# Patient Record
Sex: Female | Born: 1975 | Race: Black or African American | Hispanic: No | Marital: Single | Smoking: Never smoker
Health system: Southern US, Community
[De-identification: ages and names within clinical notes are randomized; demographics above are authoritative.]

## PROBLEM LIST (undated history)

## (undated) ENCOUNTER — Inpatient Hospital Stay (HOSPITAL_COMMUNITY): Payer: Self-pay

## (undated) DIAGNOSIS — N200 Calculus of kidney: Secondary | ICD-10-CM

## (undated) DIAGNOSIS — E538 Deficiency of other specified B group vitamins: Secondary | ICD-10-CM

## (undated) DIAGNOSIS — R590 Localized enlarged lymph nodes: Secondary | ICD-10-CM

## (undated) DIAGNOSIS — I10 Essential (primary) hypertension: Secondary | ICD-10-CM

## (undated) DIAGNOSIS — O039 Complete or unspecified spontaneous abortion without complication: Secondary | ICD-10-CM

## (undated) DIAGNOSIS — R519 Headache, unspecified: Secondary | ICD-10-CM

## (undated) DIAGNOSIS — M069 Rheumatoid arthritis, unspecified: Secondary | ICD-10-CM

## (undated) DIAGNOSIS — E079 Disorder of thyroid, unspecified: Secondary | ICD-10-CM

## (undated) DIAGNOSIS — K219 Gastro-esophageal reflux disease without esophagitis: Secondary | ICD-10-CM

## (undated) DIAGNOSIS — R87629 Unspecified abnormal cytological findings in specimens from vagina: Secondary | ICD-10-CM

## (undated) DIAGNOSIS — Z87898 Personal history of other specified conditions: Secondary | ICD-10-CM

## (undated) DIAGNOSIS — R070 Pain in throat: Secondary | ICD-10-CM

## (undated) DIAGNOSIS — Z87442 Personal history of urinary calculi: Secondary | ICD-10-CM

## (undated) DIAGNOSIS — J36 Peritonsillar abscess: Secondary | ICD-10-CM

## (undated) DIAGNOSIS — K5120 Ulcerative (chronic) proctitis without complications: Secondary | ICD-10-CM

## (undated) DIAGNOSIS — O99280 Endocrine, nutritional and metabolic diseases complicating pregnancy, unspecified trimester: Secondary | ICD-10-CM

## (undated) DIAGNOSIS — E039 Hypothyroidism, unspecified: Secondary | ICD-10-CM

## (undated) DIAGNOSIS — A609 Anogenital herpesviral infection, unspecified: Secondary | ICD-10-CM

## (undated) HISTORY — DX: Essential (primary) hypertension: I10

## (undated) HISTORY — PX: LEEP: SHX91

## (undated) HISTORY — PX: WISDOM TOOTH EXTRACTION: SHX21

## (undated) HISTORY — DX: Anogenital herpesviral infection, unspecified: A60.9

## (undated) HISTORY — DX: Ulcerative (chronic) proctitis without complications: K51.20

## (undated) HISTORY — DX: Peritonsillar abscess: J36

## (undated) HISTORY — DX: Pain in throat: R07.0

## (undated) HISTORY — DX: Complete or unspecified spontaneous abortion without complication: O03.9

## (undated) HISTORY — DX: Personal history of other specified conditions: Z87.898

## (undated) HISTORY — PX: CYST EXCISION: SHX5701

## (undated) HISTORY — DX: Gastro-esophageal reflux disease without esophagitis: K21.9

## (undated) HISTORY — DX: Localized enlarged lymph nodes: R59.0

## (undated) HISTORY — DX: Deficiency of other specified B group vitamins: E53.8

## (undated) HISTORY — DX: Disorder of thyroid, unspecified: E07.9

## (undated) HISTORY — DX: Disorder of thyroid, unspecified: O99.280

---

## 1997-09-29 ENCOUNTER — Emergency Department (HOSPITAL_COMMUNITY): Admission: EM | Admit: 1997-09-29 | Discharge: 1997-09-29 | Payer: Self-pay | Admitting: Emergency Medicine

## 1997-12-26 ENCOUNTER — Inpatient Hospital Stay (HOSPITAL_COMMUNITY): Admission: AD | Admit: 1997-12-26 | Discharge: 1997-12-26 | Payer: Self-pay | Admitting: *Deleted

## 1998-01-06 ENCOUNTER — Emergency Department (HOSPITAL_COMMUNITY): Admission: EM | Admit: 1998-01-06 | Discharge: 1998-01-06 | Payer: Self-pay

## 1998-06-11 ENCOUNTER — Inpatient Hospital Stay (HOSPITAL_COMMUNITY): Admission: AD | Admit: 1998-06-11 | Discharge: 1998-06-11 | Payer: Self-pay | Admitting: *Deleted

## 1998-06-17 ENCOUNTER — Inpatient Hospital Stay (HOSPITAL_COMMUNITY): Admission: EM | Admit: 1998-06-17 | Discharge: 1998-06-17 | Payer: Self-pay | Admitting: *Deleted

## 1998-09-02 ENCOUNTER — Inpatient Hospital Stay (HOSPITAL_COMMUNITY): Admission: AD | Admit: 1998-09-02 | Discharge: 1998-09-02 | Payer: Self-pay | Admitting: *Deleted

## 1998-10-20 ENCOUNTER — Emergency Department (HOSPITAL_COMMUNITY): Admission: EM | Admit: 1998-10-20 | Discharge: 1998-10-20 | Payer: Self-pay | Admitting: Emergency Medicine

## 1998-11-25 ENCOUNTER — Inpatient Hospital Stay (HOSPITAL_COMMUNITY): Admission: AD | Admit: 1998-11-25 | Discharge: 1998-11-25 | Payer: Self-pay | Admitting: *Deleted

## 1998-12-11 ENCOUNTER — Emergency Department (HOSPITAL_COMMUNITY): Admission: EM | Admit: 1998-12-11 | Discharge: 1998-12-11 | Payer: Self-pay | Admitting: Internal Medicine

## 1998-12-28 ENCOUNTER — Emergency Department (HOSPITAL_COMMUNITY): Admission: EM | Admit: 1998-12-28 | Discharge: 1998-12-28 | Payer: Self-pay | Admitting: Emergency Medicine

## 1998-12-31 ENCOUNTER — Encounter: Payer: Self-pay | Admitting: Emergency Medicine

## 1998-12-31 ENCOUNTER — Emergency Department (HOSPITAL_COMMUNITY): Admission: EM | Admit: 1998-12-31 | Discharge: 1998-12-31 | Payer: Self-pay | Admitting: Emergency Medicine

## 1999-01-07 ENCOUNTER — Encounter: Admission: RE | Admit: 1999-01-07 | Discharge: 1999-01-07 | Payer: Self-pay | Admitting: Internal Medicine

## 1999-01-15 ENCOUNTER — Ambulatory Visit (HOSPITAL_COMMUNITY): Admission: RE | Admit: 1999-01-15 | Discharge: 1999-01-15 | Payer: Self-pay | Admitting: Internal Medicine

## 1999-02-17 ENCOUNTER — Inpatient Hospital Stay (HOSPITAL_COMMUNITY): Admission: AD | Admit: 1999-02-17 | Discharge: 1999-02-17 | Payer: Self-pay | Admitting: *Deleted

## 1999-04-08 ENCOUNTER — Encounter: Admission: RE | Admit: 1999-04-08 | Discharge: 1999-04-08 | Payer: Self-pay | Admitting: Internal Medicine

## 1999-04-15 ENCOUNTER — Encounter: Admission: RE | Admit: 1999-04-15 | Discharge: 1999-04-15 | Payer: Self-pay | Admitting: Hematology and Oncology

## 1999-05-12 ENCOUNTER — Inpatient Hospital Stay (HOSPITAL_COMMUNITY): Admission: AD | Admit: 1999-05-12 | Discharge: 1999-05-12 | Payer: Self-pay | Admitting: *Deleted

## 1999-08-12 ENCOUNTER — Inpatient Hospital Stay (HOSPITAL_COMMUNITY): Admission: AD | Admit: 1999-08-12 | Discharge: 1999-08-12 | Payer: Self-pay | Admitting: *Deleted

## 1999-09-02 ENCOUNTER — Emergency Department (HOSPITAL_COMMUNITY): Admission: EM | Admit: 1999-09-02 | Discharge: 1999-09-02 | Payer: Self-pay | Admitting: *Deleted

## 1999-10-13 ENCOUNTER — Encounter: Payer: Self-pay | Admitting: Internal Medicine

## 1999-10-13 ENCOUNTER — Ambulatory Visit (HOSPITAL_COMMUNITY): Admission: RE | Admit: 1999-10-13 | Discharge: 1999-10-13 | Payer: Self-pay | Admitting: Internal Medicine

## 1999-11-18 ENCOUNTER — Inpatient Hospital Stay (HOSPITAL_COMMUNITY): Admission: AD | Admit: 1999-11-18 | Discharge: 1999-11-18 | Payer: Self-pay | Admitting: *Deleted

## 2000-02-03 ENCOUNTER — Emergency Department (HOSPITAL_COMMUNITY): Admission: EM | Admit: 2000-02-03 | Discharge: 2000-02-03 | Payer: Self-pay | Admitting: Emergency Medicine

## 2000-02-20 ENCOUNTER — Inpatient Hospital Stay (HOSPITAL_COMMUNITY): Admission: AD | Admit: 2000-02-20 | Discharge: 2000-02-20 | Payer: Self-pay | Admitting: *Deleted

## 2000-05-12 ENCOUNTER — Inpatient Hospital Stay (HOSPITAL_COMMUNITY): Admission: AD | Admit: 2000-05-12 | Discharge: 2000-05-12 | Payer: Self-pay | Admitting: *Deleted

## 2000-05-23 ENCOUNTER — Inpatient Hospital Stay (HOSPITAL_COMMUNITY): Admission: AD | Admit: 2000-05-23 | Discharge: 2000-05-23 | Payer: Self-pay | Admitting: *Deleted

## 2000-07-01 ENCOUNTER — Other Ambulatory Visit: Admission: RE | Admit: 2000-07-01 | Discharge: 2000-07-01 | Payer: Self-pay | Admitting: *Deleted

## 2000-07-01 ENCOUNTER — Encounter (INDEPENDENT_AMBULATORY_CARE_PROVIDER_SITE_OTHER): Payer: Self-pay

## 2000-08-20 ENCOUNTER — Emergency Department (HOSPITAL_COMMUNITY): Admission: EM | Admit: 2000-08-20 | Discharge: 2000-08-20 | Payer: Self-pay | Admitting: Emergency Medicine

## 2000-09-13 ENCOUNTER — Emergency Department (HOSPITAL_COMMUNITY): Admission: EM | Admit: 2000-09-13 | Discharge: 2000-09-13 | Payer: Self-pay | Admitting: Emergency Medicine

## 2000-11-25 ENCOUNTER — Encounter: Payer: Self-pay | Admitting: Emergency Medicine

## 2000-11-25 ENCOUNTER — Emergency Department (HOSPITAL_COMMUNITY): Admission: EM | Admit: 2000-11-25 | Discharge: 2000-11-25 | Payer: Self-pay | Admitting: Emergency Medicine

## 2001-02-01 ENCOUNTER — Other Ambulatory Visit: Admission: RE | Admit: 2001-02-01 | Discharge: 2001-02-01 | Payer: Self-pay | Admitting: Internal Medicine

## 2001-02-01 ENCOUNTER — Encounter: Admission: RE | Admit: 2001-02-01 | Discharge: 2001-02-01 | Payer: Self-pay | Admitting: Internal Medicine

## 2001-03-04 ENCOUNTER — Encounter: Admission: RE | Admit: 2001-03-04 | Discharge: 2001-03-04 | Payer: Self-pay

## 2001-03-05 ENCOUNTER — Emergency Department (HOSPITAL_COMMUNITY): Admission: EM | Admit: 2001-03-05 | Discharge: 2001-03-06 | Payer: Self-pay | Admitting: Emergency Medicine

## 2001-03-14 ENCOUNTER — Encounter: Admission: RE | Admit: 2001-03-14 | Discharge: 2001-03-14 | Payer: Self-pay

## 2001-05-26 ENCOUNTER — Encounter: Admission: RE | Admit: 2001-05-26 | Discharge: 2001-05-26 | Payer: Self-pay | Admitting: Internal Medicine

## 2001-05-26 ENCOUNTER — Ambulatory Visit (HOSPITAL_COMMUNITY): Admission: RE | Admit: 2001-05-26 | Discharge: 2001-05-26 | Payer: Self-pay | Admitting: Internal Medicine

## 2001-06-28 ENCOUNTER — Encounter: Admission: RE | Admit: 2001-06-28 | Discharge: 2001-06-28 | Payer: Self-pay | Admitting: Internal Medicine

## 2001-09-01 ENCOUNTER — Encounter: Admission: RE | Admit: 2001-09-01 | Discharge: 2001-09-01 | Payer: Self-pay | Admitting: Internal Medicine

## 2001-10-04 ENCOUNTER — Encounter: Admission: RE | Admit: 2001-10-04 | Discharge: 2001-10-04 | Payer: Self-pay | Admitting: Internal Medicine

## 2002-01-24 ENCOUNTER — Encounter: Admission: RE | Admit: 2002-01-24 | Discharge: 2002-01-24 | Payer: Self-pay | Admitting: Internal Medicine

## 2002-03-31 ENCOUNTER — Emergency Department (HOSPITAL_COMMUNITY): Admission: EM | Admit: 2002-03-31 | Discharge: 2002-03-31 | Payer: Self-pay | Admitting: Emergency Medicine

## 2002-06-01 ENCOUNTER — Encounter: Admission: RE | Admit: 2002-06-01 | Discharge: 2002-06-01 | Payer: Self-pay | Admitting: Internal Medicine

## 2002-06-01 ENCOUNTER — Ambulatory Visit (HOSPITAL_COMMUNITY): Admission: RE | Admit: 2002-06-01 | Discharge: 2002-06-01 | Payer: Self-pay | Admitting: Internal Medicine

## 2002-06-01 ENCOUNTER — Encounter: Payer: Self-pay | Admitting: Internal Medicine

## 2002-06-27 ENCOUNTER — Encounter: Admission: RE | Admit: 2002-06-27 | Discharge: 2002-06-27 | Payer: Self-pay | Admitting: Internal Medicine

## 2003-02-27 ENCOUNTER — Emergency Department (HOSPITAL_COMMUNITY): Admission: EM | Admit: 2003-02-27 | Discharge: 2003-02-27 | Payer: Self-pay | Admitting: *Deleted

## 2003-04-03 ENCOUNTER — Emergency Department (HOSPITAL_COMMUNITY): Admission: EM | Admit: 2003-04-03 | Discharge: 2003-04-04 | Payer: Self-pay | Admitting: Emergency Medicine

## 2003-07-24 ENCOUNTER — Emergency Department (HOSPITAL_COMMUNITY): Admission: EM | Admit: 2003-07-24 | Discharge: 2003-07-24 | Payer: Self-pay | Admitting: Family Medicine

## 2003-07-26 ENCOUNTER — Emergency Department (HOSPITAL_COMMUNITY): Admission: EM | Admit: 2003-07-26 | Discharge: 2003-07-26 | Payer: Self-pay | Admitting: Family Medicine

## 2003-07-28 ENCOUNTER — Emergency Department (HOSPITAL_COMMUNITY): Admission: EM | Admit: 2003-07-28 | Discharge: 2003-07-28 | Payer: Self-pay | Admitting: Family Medicine

## 2003-08-08 ENCOUNTER — Encounter: Admission: RE | Admit: 2003-08-08 | Discharge: 2003-08-08 | Payer: Self-pay | Admitting: Internal Medicine

## 2003-08-28 ENCOUNTER — Emergency Department (HOSPITAL_COMMUNITY): Admission: EM | Admit: 2003-08-28 | Discharge: 2003-08-28 | Payer: Self-pay | Admitting: Family Medicine

## 2003-09-13 ENCOUNTER — Encounter: Admission: RE | Admit: 2003-09-13 | Discharge: 2003-09-13 | Payer: Self-pay | Admitting: Internal Medicine

## 2003-10-19 ENCOUNTER — Encounter: Admission: RE | Admit: 2003-10-19 | Discharge: 2003-10-19 | Payer: Self-pay | Admitting: Internal Medicine

## 2004-02-11 ENCOUNTER — Emergency Department (HOSPITAL_COMMUNITY): Admission: EM | Admit: 2004-02-11 | Discharge: 2004-02-11 | Payer: Self-pay | Admitting: Family Medicine

## 2004-03-06 ENCOUNTER — Emergency Department (HOSPITAL_COMMUNITY): Admission: EM | Admit: 2004-03-06 | Discharge: 2004-03-06 | Payer: Self-pay | Admitting: Family Medicine

## 2004-04-06 ENCOUNTER — Inpatient Hospital Stay (HOSPITAL_COMMUNITY): Admission: AD | Admit: 2004-04-06 | Discharge: 2004-04-07 | Payer: Self-pay | Admitting: Obstetrics & Gynecology

## 2004-06-23 ENCOUNTER — Emergency Department (HOSPITAL_COMMUNITY): Admission: EM | Admit: 2004-06-23 | Discharge: 2004-06-23 | Payer: Self-pay | Admitting: *Deleted

## 2004-06-30 ENCOUNTER — Ambulatory Visit: Payer: Self-pay | Admitting: Internal Medicine

## 2004-07-15 ENCOUNTER — Ambulatory Visit: Payer: Self-pay | Admitting: Internal Medicine

## 2004-08-15 ENCOUNTER — Encounter (INDEPENDENT_AMBULATORY_CARE_PROVIDER_SITE_OTHER): Payer: Self-pay | Admitting: Specialist

## 2004-08-15 ENCOUNTER — Ambulatory Visit (HOSPITAL_COMMUNITY): Admission: RE | Admit: 2004-08-15 | Discharge: 2004-08-15 | Payer: Self-pay | Admitting: Otolaryngology

## 2004-08-15 ENCOUNTER — Ambulatory Visit (HOSPITAL_BASED_OUTPATIENT_CLINIC_OR_DEPARTMENT_OTHER): Admission: RE | Admit: 2004-08-15 | Discharge: 2004-08-15 | Payer: Self-pay | Admitting: Otolaryngology

## 2004-08-17 ENCOUNTER — Emergency Department (HOSPITAL_COMMUNITY): Admission: EM | Admit: 2004-08-17 | Discharge: 2004-08-17 | Payer: Self-pay | Admitting: Emergency Medicine

## 2004-10-24 ENCOUNTER — Ambulatory Visit (HOSPITAL_COMMUNITY): Admission: RE | Admit: 2004-10-24 | Discharge: 2004-10-24 | Payer: Self-pay | Admitting: Obstetrics

## 2004-11-24 ENCOUNTER — Ambulatory Visit (HOSPITAL_COMMUNITY): Admission: RE | Admit: 2004-11-24 | Discharge: 2004-11-24 | Payer: Self-pay

## 2005-02-05 ENCOUNTER — Ambulatory Visit (HOSPITAL_COMMUNITY): Admission: RE | Admit: 2005-02-05 | Discharge: 2005-02-05 | Payer: Self-pay | Admitting: Internal Medicine

## 2005-02-05 ENCOUNTER — Ambulatory Visit: Payer: Self-pay | Admitting: Internal Medicine

## 2005-02-25 ENCOUNTER — Ambulatory Visit (HOSPITAL_COMMUNITY): Admission: RE | Admit: 2005-02-25 | Discharge: 2005-02-25 | Payer: Self-pay | Admitting: Obstetrics

## 2005-03-09 HISTORY — PX: OTHER SURGICAL HISTORY: SHX169

## 2005-05-06 ENCOUNTER — Ambulatory Visit (HOSPITAL_COMMUNITY): Admission: RE | Admit: 2005-05-06 | Discharge: 2005-05-06 | Payer: Self-pay | Admitting: Obstetrics

## 2005-05-08 ENCOUNTER — Inpatient Hospital Stay (HOSPITAL_COMMUNITY): Admission: AD | Admit: 2005-05-08 | Discharge: 2005-05-08 | Payer: Self-pay | Admitting: Obstetrics

## 2005-07-06 ENCOUNTER — Inpatient Hospital Stay (HOSPITAL_COMMUNITY): Admission: AD | Admit: 2005-07-06 | Discharge: 2005-07-08 | Payer: Self-pay | Admitting: Obstetrics & Gynecology

## 2006-01-12 ENCOUNTER — Ambulatory Visit: Payer: Self-pay | Admitting: Internal Medicine

## 2006-01-19 ENCOUNTER — Encounter: Admission: RE | Admit: 2006-01-19 | Discharge: 2006-02-19 | Payer: Self-pay | Admitting: Internal Medicine

## 2006-02-08 DIAGNOSIS — J45901 Unspecified asthma with (acute) exacerbation: Secondary | ICD-10-CM | POA: Insufficient documentation

## 2006-02-22 ENCOUNTER — Ambulatory Visit (HOSPITAL_COMMUNITY): Admission: RE | Admit: 2006-02-22 | Discharge: 2006-02-22 | Payer: Self-pay | Admitting: Internal Medicine

## 2006-05-28 ENCOUNTER — Emergency Department (HOSPITAL_COMMUNITY): Admission: EM | Admit: 2006-05-28 | Discharge: 2006-05-28 | Payer: Self-pay | Admitting: Emergency Medicine

## 2006-06-28 ENCOUNTER — Encounter (INDEPENDENT_AMBULATORY_CARE_PROVIDER_SITE_OTHER): Payer: Self-pay | Admitting: Internal Medicine

## 2006-08-10 ENCOUNTER — Ambulatory Visit: Payer: Self-pay | Admitting: Internal Medicine

## 2007-06-15 ENCOUNTER — Ambulatory Visit: Payer: Self-pay | Admitting: Internal Medicine

## 2007-06-15 ENCOUNTER — Encounter (INDEPENDENT_AMBULATORY_CARE_PROVIDER_SITE_OTHER): Payer: Self-pay | Admitting: Internal Medicine

## 2007-06-15 ENCOUNTER — Encounter (INDEPENDENT_AMBULATORY_CARE_PROVIDER_SITE_OTHER): Payer: Self-pay | Admitting: *Deleted

## 2007-06-15 DIAGNOSIS — I10 Essential (primary) hypertension: Secondary | ICD-10-CM | POA: Insufficient documentation

## 2007-06-15 LAB — CONVERTED CEMR LAB
Amphetamine Screen, Ur: NEGATIVE
BUN: 16 mg/dL (ref 6–23)
Barbiturate Quant, Ur: NEGATIVE
Basophils Absolute: 0 10*3/uL (ref 0.0–0.1)
Basophils Relative: 0 % (ref 0–1)
Benzodiazepines.: NEGATIVE
Bilirubin Urine: NEGATIVE
CO2: 25 meq/L (ref 19–32)
Calcium: 9.3 mg/dL (ref 8.4–10.5)
Chloride: 97 meq/L (ref 96–112)
Cocaine Metabolites: NEGATIVE
Creatinine, Ser: 0.99 mg/dL (ref 0.40–1.20)
Creatinine,U: 196.6 mg/dL
Eosinophils Absolute: 0.2 10*3/uL (ref 0.0–0.7)
Eosinophils Relative: 3 % (ref 0–5)
Glucose, Bld: 86 mg/dL (ref 70–99)
HCT: 45.4 % (ref 36.0–46.0)
Hemoglobin: 16.1 g/dL — ABNORMAL HIGH (ref 12.0–15.0)
Ketones, ur: NEGATIVE mg/dL
Leukocytes, UA: NEGATIVE
Lymphocytes Relative: 39 % (ref 12–46)
Lymphs Abs: 2.9 10*3/uL (ref 0.7–4.0)
MCHC: 35.5 g/dL (ref 30.0–36.0)
MCV: 86.5 fL (ref 78.0–100.0)
Marijuana Metabolite: POSITIVE — AB
Methadone: NEGATIVE
Monocytes Absolute: 0.6 10*3/uL (ref 0.1–1.0)
Monocytes Relative: 8 % (ref 3–12)
Neutro Abs: 3.7 10*3/uL (ref 1.7–7.7)
Neutrophils Relative %: 50 % (ref 43–77)
Nitrite: NEGATIVE
Opiates: NEGATIVE
Phencyclidine (PCP): NEGATIVE
Platelets: 332 10*3/uL (ref 150–400)
Potassium: 4.5 meq/L (ref 3.5–5.3)
Propoxyphene: NEGATIVE
Protein, ur: NEGATIVE mg/dL
RBC: 5.25 M/uL — ABNORMAL HIGH (ref 3.87–5.11)
RDW: 13.5 % (ref 11.5–15.5)
Sodium: 135 meq/L (ref 135–145)
Specific Gravity, Urine: 1.025 (ref 1.005–1.03)
TSH: 0.992 microintl units/mL (ref 0.350–5.50)
Urine Glucose: NEGATIVE mg/dL
Urobilinogen, UA: 0.2 (ref 0.0–1.0)
WBC, UA: NONE SEEN cells/hpf (ref <3)
WBC: 7.4 10*3/uL (ref 4.0–10.5)
pH: 6 (ref 5.0–8.0)

## 2007-06-23 ENCOUNTER — Ambulatory Visit: Payer: Self-pay | Admitting: Infectious Disease

## 2007-07-26 ENCOUNTER — Ambulatory Visit: Payer: Self-pay | Admitting: Internal Medicine

## 2007-08-16 ENCOUNTER — Ambulatory Visit: Payer: Self-pay | Admitting: Internal Medicine

## 2007-09-26 ENCOUNTER — Telehealth: Payer: Self-pay | Admitting: *Deleted

## 2007-10-07 ENCOUNTER — Emergency Department (HOSPITAL_COMMUNITY): Admission: EM | Admit: 2007-10-07 | Discharge: 2007-10-07 | Payer: Self-pay | Admitting: Emergency Medicine

## 2007-11-04 ENCOUNTER — Ambulatory Visit: Payer: Self-pay | Admitting: Internal Medicine

## 2007-12-08 ENCOUNTER — Encounter (INDEPENDENT_AMBULATORY_CARE_PROVIDER_SITE_OTHER): Payer: Self-pay | Admitting: Internal Medicine

## 2007-12-12 ENCOUNTER — Encounter (INDEPENDENT_AMBULATORY_CARE_PROVIDER_SITE_OTHER): Payer: Self-pay | Admitting: Internal Medicine

## 2007-12-13 ENCOUNTER — Emergency Department (HOSPITAL_COMMUNITY): Admission: EM | Admit: 2007-12-13 | Discharge: 2007-12-13 | Payer: Self-pay | Admitting: Emergency Medicine

## 2007-12-20 ENCOUNTER — Ambulatory Visit (HOSPITAL_COMMUNITY): Admission: RE | Admit: 2007-12-20 | Discharge: 2007-12-20 | Payer: Self-pay | Admitting: Internal Medicine

## 2007-12-20 ENCOUNTER — Encounter (INDEPENDENT_AMBULATORY_CARE_PROVIDER_SITE_OTHER): Payer: Self-pay | Admitting: Internal Medicine

## 2007-12-26 ENCOUNTER — Encounter (INDEPENDENT_AMBULATORY_CARE_PROVIDER_SITE_OTHER): Payer: Self-pay | Admitting: Internal Medicine

## 2007-12-26 DIAGNOSIS — K5120 Ulcerative (chronic) proctitis without complications: Secondary | ICD-10-CM

## 2007-12-26 HISTORY — DX: Ulcerative (chronic) proctitis without complications: K51.20

## 2007-12-30 ENCOUNTER — Encounter (INDEPENDENT_AMBULATORY_CARE_PROVIDER_SITE_OTHER): Payer: Self-pay | Admitting: Internal Medicine

## 2008-01-30 ENCOUNTER — Ambulatory Visit: Payer: Self-pay | Admitting: *Deleted

## 2008-01-30 ENCOUNTER — Ambulatory Visit (HOSPITAL_COMMUNITY): Admission: RE | Admit: 2008-01-30 | Discharge: 2008-01-30 | Payer: Self-pay | Admitting: Internal Medicine

## 2008-01-30 ENCOUNTER — Encounter (INDEPENDENT_AMBULATORY_CARE_PROVIDER_SITE_OTHER): Payer: Self-pay | Admitting: *Deleted

## 2008-02-01 LAB — CONVERTED CEMR LAB
Free T4: 1.17 ng/dL (ref 0.89–1.80)
TSH: 0.228 microintl units/mL — ABNORMAL LOW (ref 0.350–4.50)

## 2008-03-28 ENCOUNTER — Encounter (INDEPENDENT_AMBULATORY_CARE_PROVIDER_SITE_OTHER): Payer: Self-pay | Admitting: Internal Medicine

## 2008-06-22 ENCOUNTER — Emergency Department (HOSPITAL_COMMUNITY): Admission: EM | Admit: 2008-06-22 | Discharge: 2008-06-22 | Payer: Self-pay | Admitting: Emergency Medicine

## 2008-07-03 ENCOUNTER — Encounter (INDEPENDENT_AMBULATORY_CARE_PROVIDER_SITE_OTHER): Payer: Self-pay | Admitting: Internal Medicine

## 2008-08-16 ENCOUNTER — Encounter: Admission: RE | Admit: 2008-08-16 | Discharge: 2008-08-16 | Payer: Self-pay | Admitting: Gastroenterology

## 2008-08-21 ENCOUNTER — Encounter: Admission: RE | Admit: 2008-08-21 | Discharge: 2008-08-21 | Payer: Self-pay | Admitting: Gastroenterology

## 2009-03-05 ENCOUNTER — Encounter: Payer: Self-pay | Admitting: Internal Medicine

## 2009-04-11 ENCOUNTER — Ambulatory Visit: Payer: Self-pay | Admitting: Internal Medicine

## 2009-04-11 LAB — CONVERTED CEMR LAB
BUN: 8 mg/dL (ref 6–23)
CO2: 24 meq/L (ref 19–32)
Calcium: 9.1 mg/dL (ref 8.4–10.5)
Chloride: 106 meq/L (ref 96–112)
Creatinine, Ser: 0.85 mg/dL (ref 0.40–1.20)
Glucose, Bld: 96 mg/dL (ref 70–99)
Potassium: 4.1 meq/L (ref 3.5–5.3)
Sodium: 139 meq/L (ref 135–145)

## 2009-04-23 ENCOUNTER — Ambulatory Visit: Payer: Self-pay | Admitting: Sports Medicine

## 2009-05-21 ENCOUNTER — Ambulatory Visit: Payer: Self-pay | Admitting: Sports Medicine

## 2009-06-27 ENCOUNTER — Telehealth: Payer: Self-pay | Admitting: Internal Medicine

## 2009-08-12 ENCOUNTER — Emergency Department (HOSPITAL_COMMUNITY): Admission: EM | Admit: 2009-08-12 | Discharge: 2009-08-12 | Payer: Self-pay | Admitting: Family Medicine

## 2009-12-17 ENCOUNTER — Emergency Department (HOSPITAL_COMMUNITY): Admission: EM | Admit: 2009-12-17 | Discharge: 2009-12-17 | Payer: Self-pay | Admitting: Emergency Medicine

## 2009-12-30 ENCOUNTER — Telehealth (INDEPENDENT_AMBULATORY_CARE_PROVIDER_SITE_OTHER): Payer: Self-pay | Admitting: *Deleted

## 2009-12-31 ENCOUNTER — Ambulatory Visit: Payer: Self-pay | Admitting: Family Medicine

## 2009-12-31 ENCOUNTER — Encounter (INDEPENDENT_AMBULATORY_CARE_PROVIDER_SITE_OTHER): Payer: Self-pay | Admitting: *Deleted

## 2009-12-31 DIAGNOSIS — IMO0002 Reserved for concepts with insufficient information to code with codable children: Secondary | ICD-10-CM | POA: Insufficient documentation

## 2010-01-02 ENCOUNTER — Encounter: Payer: Self-pay | Admitting: Internal Medicine

## 2010-01-02 ENCOUNTER — Observation Stay (HOSPITAL_COMMUNITY): Admission: EM | Admit: 2010-01-02 | Discharge: 2010-01-03 | Payer: Self-pay | Admitting: Internal Medicine

## 2010-01-02 ENCOUNTER — Emergency Department (HOSPITAL_COMMUNITY): Admission: EM | Admit: 2010-01-02 | Discharge: 2010-01-02 | Payer: Self-pay | Admitting: Emergency Medicine

## 2010-01-02 ENCOUNTER — Ambulatory Visit: Payer: Self-pay | Admitting: Internal Medicine

## 2010-01-02 ENCOUNTER — Telehealth: Payer: Self-pay | Admitting: *Deleted

## 2010-01-03 ENCOUNTER — Encounter: Payer: Self-pay | Admitting: Internal Medicine

## 2010-01-06 ENCOUNTER — Encounter (INDEPENDENT_AMBULATORY_CARE_PROVIDER_SITE_OTHER): Payer: Self-pay | Admitting: *Deleted

## 2010-01-14 ENCOUNTER — Ambulatory Visit: Payer: Self-pay | Admitting: Family Medicine

## 2010-01-21 ENCOUNTER — Ambulatory Visit: Payer: Self-pay | Admitting: Internal Medicine

## 2010-02-05 ENCOUNTER — Telehealth: Payer: Self-pay | Admitting: Internal Medicine

## 2010-02-06 ENCOUNTER — Ambulatory Visit: Payer: Self-pay | Admitting: Internal Medicine

## 2010-02-14 ENCOUNTER — Telehealth: Payer: Self-pay | Admitting: Internal Medicine

## 2010-02-17 ENCOUNTER — Ambulatory Visit: Payer: Self-pay

## 2010-03-29 ENCOUNTER — Encounter: Payer: Self-pay | Admitting: Internal Medicine

## 2010-03-30 ENCOUNTER — Encounter: Payer: Self-pay | Admitting: Obstetrics

## 2010-04-01 ENCOUNTER — Telehealth: Payer: Self-pay | Admitting: *Deleted

## 2010-04-01 ENCOUNTER — Emergency Department (HOSPITAL_COMMUNITY)
Admission: EM | Admit: 2010-04-01 | Discharge: 2010-04-01 | Disposition: A | Discharge status: Other Acute Inpatient Hospital | Payer: Self-pay | Source: Home / Self Care | Admitting: Family Medicine

## 2010-04-01 ENCOUNTER — Emergency Department (HOSPITAL_COMMUNITY)
Admission: EM | Admit: 2010-04-01 | Discharge: 2010-04-01 | Payer: Self-pay | Source: Home / Self Care | Admitting: Emergency Medicine

## 2010-04-02 LAB — URINALYSIS, ROUTINE W REFLEX MICROSCOPIC
Bilirubin Urine: NEGATIVE
Hgb urine dipstick: NEGATIVE
Ketones, ur: NEGATIVE mg/dL
Nitrite: NEGATIVE
Protein, ur: NEGATIVE mg/dL
Specific Gravity, Urine: 1.021 (ref 1.005–1.030)
Urine Glucose, Fasting: NEGATIVE mg/dL
Urobilinogen, UA: 1 mg/dL (ref 0.0–1.0)
pH: 8 (ref 5.0–8.0)

## 2010-04-02 LAB — WET PREP, GENITAL
Trich, Wet Prep: NONE SEEN
Yeast Wet Prep HPF POC: NONE SEEN

## 2010-04-02 LAB — POCT URINALYSIS DIPSTICK
Bilirubin Urine: NEGATIVE
Ketones, ur: NEGATIVE mg/dL
Nitrite: NEGATIVE
Protein, ur: 30 mg/dL — AB
Specific Gravity, Urine: 1.025 (ref 1.005–1.030)
Urine Glucose, Fasting: NEGATIVE mg/dL
Urobilinogen, UA: 4 mg/dL — ABNORMAL HIGH (ref 0.0–1.0)
pH: 7 (ref 5.0–8.0)

## 2010-04-02 LAB — CBC
HCT: 35.9 % — ABNORMAL LOW (ref 36.0–46.0)
Hemoglobin: 12.4 g/dL (ref 12.0–15.0)
MCH: 30 pg (ref 26.0–34.0)
MCHC: 34.5 g/dL (ref 30.0–36.0)
MCV: 86.9 fL (ref 78.0–100.0)
Platelets: 256 10*3/uL (ref 150–400)
RBC: 4.13 MIL/uL (ref 3.87–5.11)
RDW: 13.6 % (ref 11.5–15.5)
WBC: 12.4 10*3/uL — ABNORMAL HIGH (ref 4.0–10.5)

## 2010-04-02 LAB — DIFFERENTIAL
Basophils Absolute: 0 10*3/uL (ref 0.0–0.1)
Basophils Relative: 0 % (ref 0–1)
Eosinophils Absolute: 0 10*3/uL (ref 0.0–0.7)
Eosinophils Relative: 0 % (ref 0–5)
Lymphocytes Relative: 18 % (ref 12–46)
Lymphs Abs: 2.2 10*3/uL (ref 0.7–4.0)
Monocytes Absolute: 0.7 10*3/uL (ref 0.1–1.0)
Monocytes Relative: 6 % (ref 3–12)
Neutro Abs: 9.4 10*3/uL — ABNORMAL HIGH (ref 1.7–7.7)
Neutrophils Relative %: 76 % (ref 43–77)

## 2010-04-02 LAB — COMPREHENSIVE METABOLIC PANEL
ALT: 15 U/L (ref 0–35)
AST: 17 U/L (ref 0–37)
Albumin: 3 g/dL — ABNORMAL LOW (ref 3.5–5.2)
Alkaline Phosphatase: 63 U/L (ref 39–117)
BUN: 6 mg/dL (ref 6–23)
CO2: 23 mEq/L (ref 19–32)
Calcium: 8.5 mg/dL (ref 8.4–10.5)
Chloride: 107 mEq/L (ref 96–112)
Creatinine, Ser: 0.82 mg/dL (ref 0.4–1.2)
GFR calc Af Amer: >60 mL/min (ref >60)
GFR calc non Af Amer: >60 mL/min (ref >60)
Glucose, Bld: 104 mg/dL — ABNORMAL HIGH (ref 70–99)
Potassium: 3.8 mEq/L (ref 3.5–5.1)
Sodium: 135 mEq/L (ref 135–145)
Total Bilirubin: 0.3 mg/dL (ref 0.3–1.2)
Total Protein: 6.7 g/dL (ref 6.0–8.3)

## 2010-04-02 LAB — OCCULT BLOOD, POC DEVICE: Fecal Occult Bld: NEGATIVE

## 2010-04-02 LAB — POCT PREGNANCY, URINE
Preg Test, Ur: NEGATIVE
Preg Test, Ur: NEGATIVE

## 2010-04-02 LAB — URINE MICROSCOPIC-ADD ON

## 2010-04-02 LAB — GC/CHLAMYDIA PROBE AMP, GENITAL
Chlamydia, DNA Probe: NEGATIVE
GC Probe Amp, Genital: NEGATIVE

## 2010-04-02 LAB — LIPASE, BLOOD: Lipase: 25 U/L (ref 11–59)

## 2010-04-08 NOTE — Assessment & Plan Note (Signed)
Summary: BACK PAIN/SB.   Vital Signs:  Patient profile:   35 year old female Height:      65 inches (165.10 cm) Weight:      148.7 pounds (67.59 kg) BMI:     24.83 Temp:     97.1 degrees F (36.17 degrees C) oral Pulse rate:   63 / minute BP sitting:   133 / 89  (right arm)  Vitals Entered By: Stanton Kidney Ditzler RN (April 11, 2009 9:07 AM) Is Patient Diabetic? No Pain Assessment Patient in pain? yes     Location: low back Intensity: 3 Type: aching Onset of pain  long time Nutritional Status BMI of 19 -24 = normal Nutritional Status Detail appetite good  Have you ever been in a relationship where you felt threatened, hurt or afraid?denies   Does patient need assistance? Functional Status Self care Ambulation Normal Comments Discuss back pain.   History of Present Illness: 35 y/o female with pmh as described on the EMR; who comes tothe clinic complaining of back pain. Pt reports her pain is about 3-5/10 in intensity, localized in her left lumbar area and radiated down her leg; is associated with numbness of her feet when occurred.  Patient has used  advil on/off without significant relief. She denies muscle weakness associated with her pain, but endorses muscle spasm and some difficulty walking and bearing weight on her left hip when the pain is present.  Patient denies fever, chills, incontinence, dysuria, nausea, vomiting, diarrhea, abdominal pain, SOB, CP or any other complaints.   Patient has been compliant with her medications and is also following a low sodium diet. Her asthma is pretty well controlledand she use her albuterol just occasionally.  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  Depression History:      The patient denies a depressed mood most of the day and a diminished interest in her usual daily activities.     The patient denies that she feels like life is not worth living, denies that she wishes that she were dead, and denies that she has thought about ending her life.         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) 2)  Sore Throat  (ICD-462) 3)  Ulcerative Proctitis  (ICD-556.2) 4)  Rectal Bleeding  (ICD-569.3) 5)  Hypertension  (ICD-401.9) 6)  Tachycardia  (ICD-785.0) 7)  Palpitations  (ICD-785.1) 8)  Dizziness  (ICD-780.4) 9)  Abnormal Findings, Elevated Bp w/o Htn  (ICD-796.2) 10)  Pap Smear, Lgsil, Abnormal  (ICD-795.09) 11)  Cervical Lymphadenopathy  (ICD-785.6) 12)  Abortion, Spnt w/o Complication, Complete  (ICD-634.92) 13)  Delivery, Normal Vaginal  (ICD-650) 14)  Wound Open, Buttock w/o Complication  (ICD-877.0) 15)  Chest Pain, Atypical  (ICD-786.59) 16)  Sciatica  (ICD-724.3) 17)  Asthma  (ICD-493.90)  Current Problems (verified): 1)  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) 2)  Sore Throat  (ICD-462) 3)  Ulcerative Proctitis  (ICD-556.2) 4)  Rectal Bleeding  (ICD-569.3) 5)  Hypertension  (ICD-401.9) 6)  Tachycardia  (ICD-785.0) 7)  Palpitations  (ICD-785.1) 8)  Dizziness  (ICD-780.4) 9)  Abnormal Findings, Elevated Bp  w/o Htn  (ICD-796.2) 10)  Pap Smear, Lgsil, Abnormal  (ICD-795.09) 11)  Cervical Lymphadenopathy  (ICD-785.6) 12)  Abortion, Spnt w/o Complication, Complete  (ICD-634.92) 13)  Delivery, Normal Vaginal  (ICD-650) 14)  Wound Open, Buttock w/o Complication  (ICD-877.0) 15)  Chest Pain, Atypical  (ICD-786.59) 16)  Sciatica  (ICD-724.3) 17)  Asthma  (ICD-493.90)  Medications Prior to Update: 1)  Albuterol 90 Mcg/act Aers (Albuterol) .... Inhale 2 Puffs Four Times A Day As Needed. 2)  Seasonale 0.15-0.03 Mg Tabs  (Levonorgest-Eth Estrad 91-Day) .... Take 1 Tablet By Mouth Once A Day 3)  Valtrex 500 Mg  Tabs (Valacyclovir Hcl) .... As Needed 4)  Norvasc 2.5 Mg Tabs (Amlodipine Besylate) .... Take 1 Tablet By Mouth Once A Day 5)  Canasa 1000 Mg Supp (Mesalamine) .Marland Kitchen.. 1 At Bedtime 6)  Prevacid 30 Mg Cpdr (Lansoprazole) .... One By Mouth Daily 7)  Loratadine 10 Mg  Tabs (Loratadine) .... Once Daily As Needed Allergies  Current Medications (verified): 1)  Albuterol 90 Mcg/act Aers (Albuterol) .... Inhale 2 Puffs Four Times A Day As Needed. 2)  Seasonale 0.15-0.03 Mg Tabs (Levonorgest-Eth Estrad 91-Day) .... Take 1 Tablet By Mouth Once A Day 3)  Valtrex 500 Mg  Tabs (Valacyclovir Hcl) .... As Needed 4)  Norvasc 2.5 Mg Tabs (Amlodipine Besylate) .... Take 1 Tablet By Mouth Once A Day 5)  Canasa 1000 Mg Supp (Mesalamine) .Marland Kitchen.. 1 Supp At Bedtime Once A Week 6)  Prevacid 30 Mg Cpdr (Lansoprazole) .... One By Mouth Daily  Allergies: 1)  ! Septra 2)  ! Amoxicillin  Past History:  Past Medical History: Last updated: 01/30/2008 Asthma, mild by PFT's  01/15/1999 Sciatica 01/12/06 Complete spontaneous abortion x 2 Normal vaginal delievery x 3 Cervical lymphadenopathy/ biopsy neg  [name] Buttock wound, left buttock  History of abnormal papsmear s/p cryo (Dr.Newell) Atypical chest pain  Past Surgical History: Last updated: 01/30/2008 Bx of lymph node in R neck approx 2007   Family History: Last updated: 11/04/2007 Mother - Ulcerative colitis  Social History: Last updated: 01/30/2008 does not smoke occasional beer on weekends  Risk Factors: Exercise: no (04/11/2009)  Risk Factors: Smoking Status: never (04/11/2009)  Review of Systems  The patient denies anorexia, fever, chest pain, syncope, dyspnea on exertion, peripheral edema, prolonged cough, headaches, hemoptysis, melena, hematochezia, severe indigestion/heartburn, hematuria, and incontinence.    Physical Exam  General:  alert,  well-developed, well-nourished, well-hydrated, and cooperative to examination.   Lungs:  normal respiratory effort, normal breath sounds, no crackles, and no wheezes.   Heart:  normal rate, regular rhythm, no murmur, no gallop, and no rub.   Abdomen:  soft, non-tender, normal bowel sounds, no distention, no masses, no guarding, and no rigidity.   Msk:  No deformity or scoliosis noted of thoracic or lumbar spine.  Pain on her left hip with palpation, positive left raising leg tes (45 degrees); no weakness appreciated.Patient describedincreased pain and also numbness sensation with hip internal and external rotation. Extremities:  No clubbing, cyanosis, edema. Neurologic:  alert & oriented X3, cranial nerves II-XII intact, strength normal in all extremities, and gait normal.     Impression & Recommendations:  Problem # 1:  HYPERTENSION (ICD-401.9) Well controlled on norvasc. Patient denies side effects from the medication. Will continue same regimen, will encourage her to follow a low sodium diet and will check renal function and electrolytes status.  Her updated medication list for this problem includes:    Norvasc 2.5 Mg Tabs (Amlodipine  besylate) .Marland Kitchen... Take 1 tablet by mouth once a day  Orders: T-Basic Metabolic Panel [phone])  Problem # 2:  SCIATICA (ICD-724.3) Will provide treatment with diclofenac and flexeril for her sciatica and because is has been present on and off for almost 3 years now, will make a referral to sports medicine for further evaluation. (she might need steroid injection and PT).  Her updated medication list for this problem includes:    Diclofenac Sodium 75 Mg Tbec (Diclofenac sodium) .Marland Kitchen... Take 1 tablet by mouth two times a day    Flexeril 5 Mg Tabs (Cyclobenzaprine hcl) .Marland Kitchen... Take 1 tab by mouth at bedtime  Orders: Sports Medicine (Sports Med)  Problem # 3:  ULCERATIVE PROCTITIS (ICD-556.2) Doing great. Patient will continue following with Dr. Matthias Hughs and  will also continue using mesalamine as prescribed.  Problem # 4:  ASTHMA (ICD-493.90) Well controlled and stable. Will continue PRN albuterol.  Her updated medication list for this problem includes:    Albuterol 90 Mcg/act Aers (Albuterol) ..... Inhale 2 puffs four times a day as needed.  Complete Medication List: 1)  Albuterol 90 Mcg/act Aers (Albuterol) .... Inhale 2 puffs four times a day as needed. 2)  Seasonale 0.15-0.03 Mg Tabs (Levonorgest-eth estrad 91-day) .... Take 1 tablet by mouth once a day 3)  Valtrex 500 Mg Tabs (Valacyclovir hcl) .... As needed 4)  Norvasc 2.5 Mg Tabs (Amlodipine besylate) .... Take 1 tablet by mouth once a day 5)  Canasa 1000 Mg Supp (Mesalamine) .Marland Kitchen.. 1 supp at bedtime once a week 6)  Prevacid 30 Mg Cpdr (Lansoprazole) .... One by mouth daily 7)  Diclofenac Sodium 75 Mg Tbec (Diclofenac sodium) .... Take 1 tablet by mouth two times a day 8)  Flexeril 5 Mg Tabs (Cyclobenzaprine hcl) .... Take 1 tab by mouth at bedtime  Patient Instructions: 1)  Please schedule a follow-up appointment in 2 months. 2)  Take your medication as prescribed. 3)  Follow with your sports medicine appoinment. 4)  You may move around but avoid painful activities. Apply ice to sore area for 20 minutes 2-3 times a day.  5)  Remember to use your diclofenac two times a day and also totakeyour prevacid while using diclofenac. Prescriptions: PREVACID 30 MG CPDR (LANSOPRAZOLE) one by mouth daily  #31 x 3   Entered and Authorized by:   Vassie Loll MD   Signed by:   Vassie Loll MD on 04/11/2009   Method used:   Print then Give to Patient   RxID:   [id number] NORVASC 2.5 MG TABS (AMLODIPINE BESYLATE) Take 1 tablet by mouth once a day  #30 x 5   Entered and Authorized by:   Vassie Loll MD   Signed by:   Vassie Loll MD on 04/11/2009   Method used:   Print then Give to Patient   RxID:   [id number] FLEXERIL 5 MG TABS (CYCLOBENZAPRINE HCL) Take 1 tab by mouth at bedtime   #15 x 0   Entered and Authorized by:   Vassie Loll MD   Signed by:   Vassie Loll MD on 04/11/2009   Method used:   Print then Give to Patient   RxID:   [id number] DICLOFENAC SODIUM 75 MG TBEC (DICLOFENAC SODIUM) Take 1 tablet by mouth two times a day  #30 x 1   Entered and Authorized by:   Vassie Loll MD   Signed by:   Vassie Loll MD on 04/11/2009   Method used:   Print then Give  to Patient   RxID:   [id number]  Process Orders Check Orders Results:     [facility]: ABN not required for this insurance Tests Sent for requisitioning (April 11, 2009 4:40 PM):     04/11/2009: [facility] -- T-Basic Metabolic Panel [phone] (signed)    Prevention & Chronic Care Immunizations   Influenza vaccine: Not documented    Tetanus booster: Not documented    Pneumococcal vaccine: Not documented  Other Screening   Pap smear: Not documented   Smoking status: never  (04/11/2009)  Hypertension   Last Blood Pressure: 133 / 89  (04/11/2009)   Serum creatinine: 0.99  (06/15/2007)   Serum potassium 4.5  (06/15/2007)  Self-Management Support :    Patient will work on the following items until the next clinic visit to reach self-care goals:     Medications and monitoring: take my medicines every day, bring all of my medications to every visit  (04/11/2009)     Eating: eat more vegetables, use fresh or frozen vegetables, eat fruit for snacks and desserts  (04/11/2009)     Activity: take a 30 minute walk every day  (04/11/2009)    Hypertension self-management support: Not documented

## 2010-04-08 NOTE — Assessment & Plan Note (Signed)
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  At this point, the patient is having difficulty walking, she does walk in  with a crutch to the office.  She is having severe pain, requiring tramadol, and some occasional Vicodin.  She has had disc herniation in the past, which did resolve conservatively.  From an occupational standpoint, the patient works on her feet all day in department store.  Back Pain History:      The patient's back pain started approximately 12/29/2009.  The pain is located in the lower back region and does radiate below the knees.  She states this is not work related.  On a scale of 1-10, she describes the pain as a 10.  She states that she has had a prior history of back pain.  The following makes the back pain worse: bending over.    Critical Exclusionary Diagnosis Criteria (CEDC) for Back Pain:      The patient denies a history of previous trauma.  She has no prior history of spinal surgery.  There are no symptoms to suggest infection, cancer, cauda equina, or psychosocial factors for back pain.  Other positive CEDC factors include low back pain worse with activity and low back pain worse with lumbar extension (downhill walking-standing-reaching overhead).    Allergies: 1)  ! Septra 2)  ! Amoxicillin  Past History:  Past medical, surgical, family and social histories (including risk factors) reviewed, and no changes noted (except as noted below).  Past Medical  History: Reviewed history from 01/30/2008 and no changes required. Asthma, mild by PFT's  01/15/1999 Sciatica 01/12/06 Complete spontaneous abortion x 2 Normal vaginal delievery x 3 Cervical lymphadenopathy/ biopsy neg  [name] Buttock wound, left buttock  History of abnormal papsmear s/p cryo (Dr.Newell) Atypical chest pain  Past Surgical History: Reviewed history from 01/30/2008 and no changes required. Bx of lymph node in R neck approx 2007   Family History: Reviewed history from 11/04/2007 and no changes required. Mother - Ulcerative colitis  Social History: Reviewed history from 01/30/2008 and no changes required. does not smoke occasional beer on weekends  Review of Systems       REVIEW OF SYSTEMS  GEN: No systemic complaints, no fevers, chills, sweats, or other acute illnesses MSK: Detailed in the HPI GI: tolerating PO intake without difficulty Neuro: as described Otherwise the pertinent positives of the ROS are noted above.    Physical Exam  General:  GEN: Well-developed,well-nourished,in no acute distress; alert,appropriate and cooperative throughout examination HEENT: Normocephalic and atraumatic without obvious abnormalities. No apparent alopecia or balding. Ears, externally no deformities PULM: Breathing comfortably in no respiratory distress EXT: No clubbing, cyanosis, or edema PSYCH: Normally interactive. Cooperative during the interview. Pleasant. Friendly and conversant. Not anxious or depressed appearing. Normal, full affect.  Msk:  Normal Greater trochanteric bursae Sciatic Notches: minimally ttp Sensation to Gross touch WNL Sensation to pinpricnk WNL DTR 2+  knee and ankle no clonus DP and PT pulses are normal B   Low Back Pain Physical Exam:    Inspection-deformity:     No    Palpation-spinal tenderness:   Yes       Location:   L5-S1    Motor Exam/Strength:         Left Ankle Dorsiflexion (L5,L4):     normal       Left Great Toe  Dorsiflexion (L5,L4):     normal       Left Heel Walk (L5,some L4):     normal       Left Single Squat & Rise-Quads (L4):   normal       Left Toe Walk-calf (S1):       normal       Right Ankle Dorsiflexion (L5,L4):     normal       Right Great Toe Dorsiflexion (L5,L4):       normal       Right Heel Walk (L5,some L4):     normal       Right Single Squat & Rise Quads (L4):   normal       Right Toe Walk-calf (S1):       normal    Sensory Exam/Pinprick:        Left Medial Foot (L4):   normal       Left Dorsal Foot (L5):   normal       Left Lateral Foot (S1):   normal       Right Medial Foot (L4):   normal       Right Dorsal Foot (L5):   normal       Right Lateral Foot (S1):   normal       Sensory--Other:     sensation intact [SLR NOT TRUE POSITIVE, BUT PAIN SEVERE IN LBP, NOT RADICULOPATHY]    Reflexes:        Left Knee Jerk (L4):     normal       Left Ankle Reflex (S1):   normal       Right Knee Jerk:     normal       Right Ankle Reflex (S1):   normal    Straight Leg Raise (SLR):       Left Straight Leg Raise (SLR):   positive at 15 degrees       Right Straight Leg Raise (SLR):   positive at 30 degrees   Impression & Recommendations:  Problem # 1:  HERNIATED DISC (ICD-722.2) probable acute disc herniation.  Patient had acute pain in the office, given Toradol 60 mg.  Prednisone, long taper. Vicodin or tramadol p.r.n. pain right now  given severity Change muscle relaxant to Zanaflex at night The vast majority of these cases will improve conservatively, however, I suspect that the patient did have some shifting and does have some nerve impingement, but does not have any red flags. No bowel or bladder incontinence, no saddle anesthesia,  and though she does have some paresthesias, her sensory and motor function remains intact.  OOW x 1 week, f/u 3 weeks  Problem # 2:  BACK PAIN, LUMBAR (ICD-724.2)  The following medications were removed from the medication list:    Flexeril 5 Mg  Tabs (Cyclobenzaprine hcl) .Marland Kitchen... Take 1 tab by mouth at bedtime Her updated medication list for this problem includes:    Tramadol Hcl 50 Mg Tabs (Tramadol hcl) .Marland Kitchen... 1 by mouth qid as needed pain  Hydrocodone-acetaminophen 5-500 Mg Tabs (Hydrocodone-acetaminophen) .Marland Kitchen... 1 by mouth q 6 hours as needed pain    Tizanidine Hcl 4 Mg Tabs (Tizanidine hcl) .Marland Kitchen... 1 by mouth at bedtime as needed muscle spasm  Orders: Ketorolac-Toradol 15mg  (Z6109)  Complete Medication List: 1)  Albuterol 90 Mcg/act Aers (Albuterol) .... Inhale 2 puffs four times a day as needed. 2)  Seasonale 0.15-0.03 Mg Tabs (Levonorgest-eth estrad 91-day) .... Take 1 tablet by mouth once a day 3)  Valtrex 500 Mg Tabs (Valacyclovir hcl) .... As needed 4)  Norvasc 2.5 Mg Tabs (Amlodipine besylate) .... Take 1 tablet by mouth once a day 5)  Canasa 1000 Mg Supp (Mesalamine) .Marland Kitchen.. 1 supp at bedtime once a week 6)  Nexium 40 Mg Cpdr (Esomeprazole magnesium) .... Take 1 capsule by mouth once a day 7)  Amitriptyline Hcl 25 Mg Tabs (Amitriptyline hcl) .Marland Kitchen.. 1 by mouth at bedtime 8)  Tramadol Hcl 50 Mg Tabs (Tramadol hcl) .Marland Kitchen.. 1 by mouth qid as needed pain 9)  Prednisone 10 Mg Tabs (Prednisone) .... 4 tabs by mouth for 6 days, then 3 tabs by mouth for 4 days, then 2 tabs by mouth for 4 days, then 1 tab by mouth for 4 days 10)  Hydrocodone-acetaminophen 5-500 Mg Tabs (Hydrocodone-acetaminophen) .Marland Kitchen.. 1 by mouth q 6 hours as needed pain 11)  Tizanidine Hcl 4 Mg Tabs (Tizanidine hcl) .Marland Kitchen.. 1 by mouth at bedtime as needed muscle spasm  Patient Instructions: 1)  f/u 3 weeks Prescriptions: TIZANIDINE HCL 4 MG TABS (TIZANIDINE HCL) 1 by mouth at bedtime as needed muscle spasm  #30 x 2   Entered and Authorized by:   Hannah Beat MD   Signed by:   Hannah Beat MD on 12/31/2009   Method used:   Print then Give to Patient   RxID:   [id number] HYDROCODONE-ACETAMINOPHEN 5-500 MG TABS (HYDROCODONE-ACETAMINOPHEN) 1 by mouth q 6 hours as  needed pain  #40 x 0   Entered and Authorized by:   Hannah Beat MD   Signed by:   Hannah Beat MD on 12/31/2009   Method used:   Print then Give to Patient   RxID:   [id number] PREDNISONE 10 MG  TABS (PREDNISONE) 4 tabs by mouth for 6 days, then 3 tabs by mouth for 4 days, then 2 tabs by mouth for 4 days, then 1 tab by mouth for 4 days  #48 x 0   Entered and Authorized by:   Hannah Beat MD   Signed by:   Hannah Beat MD on 12/31/2009   Method used:   Print then Give to Patient   RxID:   [id number] AMITRIPTYLINE HCL 25 MG TABS (AMITRIPTYLINE HCL) 1 by mouth at bedtime  #30 x 2   Entered and Authorized by:   Hannah Beat MD   Signed by:   Hannah Beat MD on 12/31/2009   Method used:   Print then Give to Patient   RxID:   [id number]    Medication Administration  Injection # 3:    Medication: Ketorolac-Toradol 15mg     Diagnosis: BACK PAIN, LUMBAR (ICD-724.2)    Route: IM    Site: LUOQ gluteus    Exp Date: 01/08/2011    Lot #: [id number]    Mfr: [email or web address]    Comments: Gave 60 mg.    Patient tolerated injection without complications    Given by: Rochele Pages RN (December 31, 2009 3:20 PM)  Orders Added: 1)  Ketorolac-Toradol 15mg  [J1885] 2)  Est. Patient Level IV [  99214]     Medication Administration  Injection # 3:    Medication: Ketorolac-Toradol 15mg     Diagnosis: BACK PAIN, LUMBAR (ICD-724.2)    Route: IM    Site: LUOQ gluteus    Exp Date: 01/08/2011    Lot #: [id number]    Mfr: [email or web address]    Comments: Gave 60 mg.    Patient tolerated injection without complications    Given by: Rochele Pages RN (December 31, 2009 3:20 PM)  Orders Added: 1)  Ketorolac-Toradol 15mg  [J1885] 2)  Est. Patient Level IV [45409]

## 2010-04-08 NOTE — Procedures (Signed)
[facility]   Imported By: Florinda Marker 12/26/2007 16:45:23  _____________________________________________________________________  External Attachment:    Type:   Image     Comment:   External Document

## 2010-04-08 NOTE — Assessment & Plan Note (Signed)
Summary: back pain/gg  see note   Vital Signs:  Patient profile:   35 year old female Height:      65 inches (165.10 cm) Weight:      156.5 pounds (71.14 kg) BMI:     26.14 Temp:     97.8 degrees F oral Pulse rate:   74 / minute BP standing:   132 / 78  (left arm) Cuff size:   regular  Vitals Entered By: Chinita Pester RN (January 02, 2010 2:09 PM) CC: Back pain since Sunday; radiating down left leg. 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 Is Patient Diabetic? No Pain Assessment Patient in pain? yes     Location: back Intensity: 10 Type: stabbing Onset of pain  Constant Nutritional Status BMI of 25 - 29 = overweight  Have you ever been in a relationship where you felt threatened, hurt or afraid?No   Does patient need assistance? Functional Status Self care Ambulation Impaired:Risk for fall   CC:  Back pain since Sunday; radiating down left leg. 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 Mcconaughy is a 35 year old woman with pmh significant for lower back pain, HTN, Sciatica and asthma who presents to the clinic for worsening lower back pain.  Dr. Cyndie Chime note 10/25 She was seen as an [facility]  [facility] for acute onset of back pain, 3 days prior with severe, lightninglike pain and  some radiculopathy, most notably on the LEFT, with some lesser symptoms on the RIGHT.She had difficulty walking, and walked in with a crutch to the office.  She is having severe pain, requiring tramadol, and some occasional Vicodin. She has had disc herniation in the past, which did resolve conservatively.From an occupational standpoint, the patient works on her feet all day in department store.  Patient reports going to the ED last night, because she lost control of her bladder. She states she stood up when she noticed herself to be urinating, and that she was unable to stop it. She denies bowel incontinence, saddle seat paresthesias or  numbness/tingling/extremity weakness. Patient states pain is aggrevated by movement, especially movement of left leg. Patient had a complete physical exam done in the ED, where it was noted that patient had pain on left leg raise, with normal sphincter, anal tone. Patient was given Dilaudid and Valium and was discharged home. No further imaging studies were done.  Patient returns today because she continues to be incontinent, along with worsening paresthesia in her left foot.   Depression History:      The patient denies a depressed mood most of the day and a diminished interest in her usual daily activities.         Preventive Screening-Counseling & Management  Alcohol-Tobacco     Alcohol type: occasional- beer     Smoking Status: never  Caffeine-Diet-Exercise     Does Patient Exercise: no  Current Medications (verified): 1)  Albuterol 90 Mcg/act Aers (Albuterol) .... Inhale 2 Puffs Four Times A Day As Needed. 2)  Seasonale 0.15-0.03 Mg Tabs (Levonorgest-Eth Estrad 91-Day) .... Take 1 Tablet By Mouth Once A Day 3)  Valtrex 500 Mg  Tabs (Valacyclovir Hcl) .... As Needed 4)  Norvasc 2.5 Mg Tabs (Amlodipine Besylate) .... Take 1 Tablet By Mouth Once A Day 5)  Canasa 1000 Mg Supp (Mesalamine) .Marland Kitchen.. 1 Supp At Bedtime Once A Week 6)  Nexium 40 Mg Cpdr (Esomeprazole Magnesium) .... Take 1 Capsule  By Mouth Once A Day 7)  Amitriptyline Hcl 25 Mg Tabs (Amitriptyline Hcl) .Marland Kitchen.. 1 By Mouth At Bedtime 8)  Tramadol Hcl 50 Mg Tabs (Tramadol Hcl) .Marland Kitchen.. 1 By Mouth Qid As Needed Pain 9)  Prednisone 10 Mg  Tabs (Prednisone) .... 4 Tabs By Mouth For 6 Days, Then 3 Tabs By Mouth For 4 Days, Then 2 Tabs By Mouth For 4 Days, Then 1 Tab By Mouth For 4 Days 10)  Hydrocodone-Acetaminophen 5-500 Mg Tabs (Hydrocodone-Acetaminophen) .Marland Kitchen.. 1 By Mouth Q 6 Hours As Needed Pain 11)  Tizanidine Hcl 4 Mg Tabs (Tizanidine Hcl) .Marland Kitchen.. 1 By Mouth At Bedtime As Needed Muscle Spasm  Allergies (verified): 1)  ! Septra 2)  !  Amoxicillin  Past History:  Past Medical History: Last updated: 01/30/2008 Asthma, mild by PFT's  01/15/1999 Sciatica 01/12/06 Complete spontaneous abortion x 2 Normal vaginal delievery x 3 Cervical lymphadenopathy/ biopsy neg  [name] Buttock wound, left buttock  History of abnormal papsmear s/p cryo (Dr.Newell) Atypical chest pain  Past Surgical History: Last updated: 01/30/2008 Bx of lymph node in R neck approx 2007   Family History: Last updated: 11/04/2007 Mother - Ulcerative colitis  Social History: Last updated: 01/30/2008 does not smoke occasional beer on weekends  Risk Factors: Exercise: no (01/02/2010)  Risk Factors: Smoking Status: never (01/02/2010)  Review of Systems GU:  Complains of incontinence. Neuro:  Complains of numbness, poor balance, tingling, and weakness.  Physical Exam  General:  alert and well-developed.  VS reviewed, and within normal limits, patient is in tears due to pain and level of functionality  Head:  normocephalic and atraumatic.   Neck:  supple and full ROM.   Lungs:  normal respiratory effort and normal breath sounds.   Heart:  normal rate and regular rhythm.   Abdomen:  soft and non-tender.   Msk:  decreased range of motion secondary to pain no joint warmth, or swelling Pulses:  pulses were equal 2+ DP bilaterally Extremities:  no edema noted Neurologic:  alert & oriented X3 and cranial nerves II-XII intact.   left lower extremity had slightly dimished strength when compared to right, but almost equal gait was ataxic pain when left leg was raised, unable to complete entire leg raise exam, as patient was in significant amount of pain no saddle anesthesia noted  reflexes not done due to level of distress Psych:  withdrawn, labile affect, and tearful.     Impression & Recommendations:  Problem # 1:  BACK PAIN, LUMBAR (ICD-724.2) Will admit forfurther evaluation. I am concerned about cauda equina syndrome, given new  neurologic deficits. I am unable to obtain a MRI currently, so I will admit and get MRI done while inpatient. Please see hospital admission note for more details.   Her updated medication list for this problem includes:    Tramadol Hcl 50 Mg Tabs (Tramadol hcl) .Marland Kitchen... 1 by mouth qid as needed pain    Hydrocodone-acetaminophen 5-500 Mg Tabs (Hydrocodone-acetaminophen) .Marland Kitchen... 1 by mouth q 6 hours as needed pain    Tizanidine Hcl 4 Mg Tabs (Tizanidine hcl) .Marland Kitchen... 1 by mouth at bedtime as needed muscle spasm  Orders: Admin of Therapeutic Inj  intramuscular or subcutaneous (16109)  Problem # 2:  HERNIATED DISC (ICD-722.2)  Please see above discussion and H&P for further details.  Will admit for further observation, and rule out cauda equina syndrome.   Complete Medication List: 1)  Albuterol 90 Mcg/act Aers (Albuterol) .... Inhale 2 puffs four times a day  as needed. 2)  Seasonale 0.15-0.03 Mg Tabs (Levonorgest-eth estrad 91-day) .... Take 1 tablet by mouth once a day 3)  Valtrex 500 Mg Tabs (Valacyclovir hcl) .... As needed 4)  Norvasc 2.5 Mg Tabs (Amlodipine besylate) .... Take 1 tablet by mouth once a day 5)  Canasa 1000 Mg Supp (Mesalamine) .Marland Kitchen.. 1 supp at bedtime once a week 6)  Nexium 40 Mg Cpdr (Esomeprazole magnesium) .... Take 1 capsule by mouth once a day 7)  Amitriptyline Hcl 25 Mg Tabs (Amitriptyline hcl) .Marland Kitchen.. 1 by mouth at bedtime 8)  Tramadol Hcl 50 Mg Tabs (Tramadol hcl) .Marland Kitchen.. 1 by mouth qid as needed pain 9)  Prednisone 10 Mg Tabs (Prednisone) .... 4 tabs by mouth for 6 days, then 3 tabs by mouth for 4 days, then 2 tabs by mouth for 4 days, then 1 tab by mouth for 4 days 10)  Hydrocodone-acetaminophen 5-500 Mg Tabs (Hydrocodone-acetaminophen) .Marland Kitchen.. 1 by mouth q 6 hours as needed pain 11)  Tizanidine Hcl 4 Mg Tabs (Tizanidine hcl) .Marland Kitchen.. 1 by mouth at bedtime as needed muscle spasm   Medication Administration  Injection # 1:    Medication: Ketorolac-Toradol 60mg     Diagnosis: BACK  PAIN, LUMBAR (ICD-724.2)    Route: IM    Site: R deltoid    Exp Date: 01/08/2011    Lot #: [id number]    Mfr: NOVA PLUS       Comments: Pain level #10.    Patient tolerated injection without complications    Given by: Chinita Pester RN (January 02, 2010 3:18 PM)  Orders Added: 1)  Admin of Therapeutic Inj  intramuscular or subcutaneous [96372] 2)  Est. Patient Level V [35361]    Prevention & Chronic Care Immunizations   Influenza vaccine: Not documented    Tetanus booster: Not documented    Pneumococcal vaccine: Not documented  Other Screening   Pap smear: Not documented   Smoking status: never  (01/02/2010)  Hypertension   Last Blood Pressure: 124 / 82  (12/31/2009)   Serum creatinine: 0.85  (04/11/2009)   Serum potassium 4.1  (04/11/2009)  Self-Management Support :    Hypertension self-management support: Not documented     Medication Administration  Injection # 1:    Medication: Ketorolac-Toradol 60mg     Diagnosis: BACK PAIN, LUMBAR (ICD-724.2)    Route: IM    Site: R deltoid    Exp Date: 01/08/2011    Lot #: [id number]    Mfr: NOVA PLUS       Comments: Pain level #10.    Patient tolerated injection without complications    Given by: Chinita Pester RN (January 02, 2010 3:18 PM)  Orders Added: 1)  Admin of Therapeutic Inj  intramuscular or subcutaneous [96372] 2)  Est. Patient Level V [44315]

## 2010-04-08 NOTE — Assessment & Plan Note (Signed)
Summary: 1WK FU/PHIFER/VS   Vital Signs:  Patient Profile:   35 Years Old Female Height:     65 inches (165.10 cm) Weight:      160.8 pounds (73.09 kg) BMI:     26.86 Temp:     98.3 degrees F (36.83 degrees C) oral Pulse rate:   84 / minute BP sitting:   125 / 76  (right arm)  Pt. in pain?   no  Vitals Entered By: Youlanda Roys RN (June 23, 2007 3:32 PM)              Is Patient Diabetic? No Nutritional Status BMI of 25 - 29 = overweight Nutritional Status Detail good  Have you ever been in a relationship where you felt threatened, hurt or afraid?denies   Does patient need assistance? Functional Status Self care Ambulation Normal     Chief Complaint:  FU - feeling better.Marland Kitchen  History of Present Illness: PT is a 35 year old woman with PMH significant for HTN who presented to clinic one week ago secondary to dizziness, palpitations, and feeling like she was going to pass out.  Prior to last visit pt was started on Triamterene-HCTZ by her Gyn doctor (see previous office note for further details).  Pt was found to be orthostatic which was attributed to her new medication.  I stopped the medication last week.  Pt is doing much better now, without complaints.      Prior Medications Reviewed Using: Patient Recall  Current Allergies (reviewed today): ! SEPTRA ! AMOXICILLIN    Risk Factors: Tobacco use:  never Alcohol use:  yes    Type:  occasional- beer Exercise:  no Seatbelt use:  100 %   Review of Systems       SEE HPI   Physical Exam  General:     alert, well-developed, well-nourished, and well-hydrated.   Head:     normocephalic and atraumatic.   Eyes:     blood shot Neck:     supple.   Lungs:     normal respiratory effort, no intercostal retractions, no accessory muscle use, normal breath sounds, no crackles, and no wheezes.   Heart:     normal rate, regular rhythm, no murmur, no gallop, no rub, and no JVD.   Abdomen:     soft, non-tender, and  normal bowel sounds.   Msk:     No deformity or scoliosis noted of thoracic or lumbar spine.   Extremities:     No clubbing, cyanosis, edema, or deformity noted with normal full range of motion of all joints.   Neurologic:     alert & oriented X3.   Skin:     color normal, no rashes, no suspicious lesions, and no ecchymoses.   Psych:     Oriented X3, normally interactive, good eye contact, not anxious appearing, and not depressed appearing.      Impression & Recommendations:  Problem # 1:  DIZZINESS (ICD-780.4) Was likely secondary to triamterene-HCTZ since the timing matched and the symptoms have since resolved.  Of note pt was positive for THC in her urine which may have contributed to some of her symptoms.  I have stopped the medication altogether.  Pts blood pressure today is 125/76 without medications.  I have elected to not start her on any medications at this time and have advised diet and exercise changes.  Pt to follow up in one month for repeat blood pressure check.  I have also asked pt  to check her blood pressure periodically and to let us know if it is elevated.  Amlodipine or lower dose of HCTZ may be a good choice for her if meds are needed.   Problem # 2:  TACHYCARDIA (ICD-785.0) Resolved.  Normal EKG and normal TSH.  Likely secondary to dehydration.   Problem # 3:  PALPITATIONS (ICD-785.1) Resolved.  Likely secondary to #1 and #2 above.   Complete Medication List: 1)  Albuterol 90 Mcg/act Aers (Albuterol) .... Inhale 2 puffs four times a day as needed. 2)  Seasonale 0.15-0.03 Mg Tabs (Levonorgest-eth estrad 91-day) .... Take 1 tablet by mouth once a day   Patient Instructions: 1)  Please schedule a follow-up appointment in 1 month. 2)  Avoid adding salt to your foods.   3)  Eat lots of fruits and vegetables but be careful with canned foods because they typically have lots of salt.   4)  Monitor your blood pressure and if you notice it being greater than 130/85 give  [place] a call. 5)  Do not take the Triamterene-HCTZ. 6)  If you have any problems give [place] a call.    ]

## 2010-04-08 NOTE — Miscellaneous (Signed)
  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 Inhale 2 puffs four times a day as needed. 2)  Seasonale 0.15-0.03 Mg Tabs (Levonorgest-eth estrad 91-day) .... Take 1 tablet by mouth once a day 3)  Valtrex 500 Mg Tabs (Valacyclovir hcl) .... As needed 4)  Norvasc 5 Mg Tabs (Amlodipine besylate) .... Take 1 tablet by mouth once a day 5)  Canasa 1000 Mg Supp (Mesalamine) .Marland Kitchen.. 1 at bedtime

## 2010-04-08 NOTE — Progress Notes (Signed)
Summary: phone/gg  Phone Note Call from Patient   Caller: Patient Summary of Call: Pt called with c/o low back pain and sciatica.  She [facility] for these problems ( in march 2011 ) and did not return for f/u.  Since that time her pain has returned.  She is also having decrease strength in legs.  I gave pt the number of sports med and advised a f/u there as that is where we sent her for this problem.  I told her if she had any problem getting in with them to please call back and we would be happy to see her. Initial call taken by: Merrie Roof RN,  December 30, 2009 9:05 AM  Follow-up for Phone Call        Agree. Follow-up by: Mariea Stable MD,  December 30, 2009 9:54 AM

## 2010-04-08 NOTE — Assessment & Plan Note (Signed)
Summary: BLOOD PRESSURE MEDICINE -STARTED BY GYN DOCTOR   Vital Signs:  Patient Profile:   35 Years Old Female Weight:      156.2 pounds Temp:     98.6 degrees F oral Pulse rate:   71 / minute BP sitting:   127 / 76  (right arm)  Pt. in pain?   no  Vitals Entered By: Filomena Jungling (August 10, 2006 10:00 AM)              Is Patient Diabetic? No   Chief Complaint:  BLOOD PRESSURE.  History of Present Illness:      Sue [name] is a 35 year old African American woman who is in today to discuss starting an antihypertensive. She was prescribed Dyazide by her OB-Gyn (Dr Clearance Coots). She wanted to discuss this with me before starting. She has no other complaints and is doing well otherwise. She is on Seasonale for birth control. Her only other med is as needed albuterol.  Current Allergies (reviewed today): ! SEPTRA ! AMOXICILLIN    Risk Factors:  Tobacco use:  never Seatbelt use:  100 %    Physical Exam  General:     alert and well-developed.   Lungs:     normal respiratory effort and normal breath sounds.   Heart:     normal rate and regular rhythm.      Impression & Recommendations:  Problem # 1:  ABNORMAL FINDINGS, ELEVATED BP W/O HTN (ICD-796.2) Sue [name]'s BP today is 127/76.  In reviewing her chart, her BP's have always been normal (eg, 105/62 - 114/65). I contacted Dr Verdell Carmine office and discussed this with him. He agreed that we should hold off on starting a medication. I will arrange for Sue [name] to come in for 3 blood pressure checks over the next month. Otherwise, no antihypertensive is needed. I discussed this with Sue [name] and she understands.   Patient Instructions: 1)  Please schedule a follow-up appointment in 6 months for a BP check.

## 2010-04-08 NOTE — Assessment & Plan Note (Signed)
Summary: ACUTE-FELT FAINT/DIZZY/HEART RACING/ADD PER GAYLE/HELEN/(PHIF...   Vital Signs:  Patient Profile:   35 Years Old Female Height:     65 inches (165.10 cm) Weight:      158.6 pounds (72.09 kg) BMI:     26.49 Temp:     97.6 degrees F (36.44 degrees C) oral Pulse rate:   106 / minute Pulse (ortho):   108 / minute BP sitting:   136 / 93  (right arm) BP standing:   115 / 81  Pt. in pain?   no  Vitals Entered By: Chinita Pester RN (June 15, 2007 4:04 PM)              Is Patient Diabetic? No Nutritional Status BMI of 25 - 29 = overweight  Have you ever been in a relationship where you felt threatened, hurt or afraid?No   Does patient need assistance? Functional Status Self care Ambulation Normal     Serial Vital Signs/Assessments:  Time      Position  BP       Pulse  Resp  Temp     By 1635 PM   Lying RA  123/92   97                    Chinita Pester RN 1635 PM   Sitting   126/91   93                    Chinita Pester RN 1635 PM   Standing  115/81   108                   Chinita Pester RN   Chief Complaint:  started new  BP med. last week; now c/o" heart racing"  and being "light-headed".  History of Present Illness: Pt is a 35 year old woman with PMH significant for HTN, asthma, and atypical chest pain presenting to clinic with complaints of dizziness, palpitations and the feeling that she was going to pass out.  Pt reports that she was started on Triamterene/HCTZ one week ago by Dr. Clearance Coots (her gyn).  Pt has reported frequent urination but denies N/V/D/.  Pt reports that she stood up to walk into the living room when she got dizzy.  Pt also noticed that she felt her heart was racing at this time.  Pt denies CP, SOB, fevers, chills, sick contacts, hematochezia, melena, hematuria, illicit drugs, but does endorse ETOH on weekends.     Prior Medications Reviewed Using: Medication Bottles  Updated Prior Medication List: ALBUTEROL 90 MCG/ACT AERS (ALBUTEROL) Inhale 2 puffs  four times a day as needed. SEASONALE 0.15-0.03 MG TABS (LEVONORGEST-ETH ESTRAD 91-DAY) Take 1 tablet by mouth once a day TRIAMTERENE-HCTZ 37.5-25 MG  CAPS (TRIAMTERENE-HCTZ) Take 1 tablet by mouth once a day  Current Allergies: ! SEPTRA ! AMOXICILLIN    Risk Factors:  Tobacco use:  never Alcohol use:  yes    Type:  occasional- beer Exercise:  no Seatbelt use:  100 %   Review of Systems       SEE HPI   Physical Exam  General:     alert, well-developed, well-nourished, and well-hydrated.  Eyes are blood shot and pt looks like she is under the influence of something.  Head:     normocephalic and atraumatic.   Eyes:     Blood shot Mouth:     Dry oropharynx.   Neck:     supple, no  carotid bruits, and no cervical lymphadenopathy.   Lungs:     normal respiratory effort, no intercostal retractions, no accessory muscle use, normal breath sounds, no crackles, and no wheezes.   Heart:     regular rhythm, no murmur, no gallop, no rub, no JVD, and tachycardia.   Abdomen:     soft, non-tender, normal bowel sounds, no distention, no masses, and no guarding.   Msk:     No deformity or scoliosis noted of thoracic or lumbar spine.   Pulses:     R and L carotid,radial,femoral,dorsalis pedis and posterior tibial pulses are full and equal bilaterally Extremities:     No clubbing, cyanosis, edema, or deformity noted with normal full range of motion of all joints.   Neurologic:     alert & oriented X3, cranial nerves II-XII intact, strength normal in all extremities, sensation intact to light touch, and gait normal.   Skin:     color normal, no rashes, no suspicious lesions, no ecchymoses, no petechiae, and no purpura.   Cervical Nodes:     no anterior cervical adenopathy and no posterior cervical adenopathy.   Psych:     Oriented X3, normally interactive, good eye contact, and slightly anxious.      Impression & Recommendations:  Problem # 1:  DIZZINESS (ICD-780.4) Pt is  dehydrated secondary to the initiation of her Triamteren-HCTZ one week ago.  This has caused her to be orthostatic as evidenced by the first set of orthostatic vital signs.  I have checked a 12 lead EKG which is essentially normal.  I will also order a BMET, CBC with diff to make sure pt is not anemic.  I will also check a UDS because I suspect the pt is under the influence.  I have instructed pt to stop taking the Triamterene-HCTZ untill follow up appointment to see how she does.  Will likely need to decrease HCTZ dose given pt does seem to drink, at least on the weekends.  Orders: 12 Lead EKG (12 Lead EKG) T-Basic Metabolic Panel [phone]) T-CBC w/Diff [id number]) T-TSH [id number]) T-Urinalysis ([id number]) T-Drug Screen-Urine, (single) ([id number])   Problem # 2:  TACHYCARDIA (ICD-785.0) Likely secondary to #1 above.  EKG showed resolution of tachycardia and no Atrial Fibrillation. Will follow up with UDS and check a TSH.  Will also make sure pt is not anemic.  Orders: 12 Lead EKG (12 Lead EKG) T-Basic Metabolic Panel [phone]) T-CBC w/Diff [phone]) T-TSH [id number]) T-Urinalysis ([id number]) T-Drug Screen-Urine, (single) ([id number])   Problem # 3:  HYPERTENSION (ICD-401.9) Will hold her Triamterene-HCTZ until follow up.  Will likely need to decrease HCTZ or change to another medication.  Will see if pt is better at follow up.  Her updated medication list for this problem includes:    Triamterene-hctz 37.5-25 Mg Caps (Triamterene-hctz) .Marland Kitchen... Take 1 tablet by mouth once a day   Complete Medication List: 1)  Albuterol 90 Mcg/act Aers (Albuterol) .... Inhale 2 puffs four times a day as needed. 2)  Seasonale 0.15-0.03 Mg Tabs (Levonorgest-eth estrad 91-day) .... Take 1 tablet by mouth once a day 3)  Triamterene-hctz 37.5-25 Mg Caps (Triamterene-hctz) .... Take 1 tablet by mouth once a day   Patient Instructions: 1)  Please schedule a follow-up appointment in  1 week. 2)  I will call you with any abnormal labs. 3)  Stop taking the triamterene-HCTZ for now. 4)  I think you are a little dehydrated which likely caused your dizziness and heart racing.  Prescriptions: TRIAMTERENE-HCTZ 37.5-25 MG  CAPS (TRIAMTERENE-HCTZ) Take 1 tablet by mouth once a day  #30 x 0   Entered and Authorized by:   Rufina Falco MD   Signed by:   Rufina Falco MD on 06/16/2007   Method used:   Print then Give to Patient   RxID:   [id number]  ]

## 2010-04-08 NOTE — Letter (Signed)
Summary: Out of Work  [facility]  [address]   [place], [place] [address]   Phone: [phone]  Fax: [phone]    January 14, 2010   Employee:  [employer]    To Whom It May Concern:   For Medical reasons, please allow patient to return to work. Weight limitation on lifting of 10 pounds:  Start:   01/14/2010   If you need additional information, please feel free to contact our office.         Sincerely,    Hannah Beat MD

## 2010-04-08 NOTE — Assessment & Plan Note (Signed)
Summary: DR Rakan Soffer WILL SEE**/abd pain,bloody stools/pcp-Vearl Aitken/hla   Vital Signs:  Patient Profile:   35 Years Old Female Height:     65 inches (165.10 cm) Weight:      155.0 pounds BMI:     25.89 Temp:     98.0 degrees F oral Pulse rate:   83 / minute BP sitting:   135 / 90  (right arm)  Pt. in pain?   yes    Location:   abdomen    Intensity:   7    Type:       CRAMPS  Vitals Entered By: Filomena Jungling NT II (November 04, 2007 10:36 AM)              Is Patient Diabetic? No Nutritional Status BMI of 25 - 29 = overweight  Have you ever been in a relationship where you felt threatened, hurt or afraid?No   Does patient need assistance? Functional Status Self care Ambulation Normal     Chief Complaint:  Abdominal Pain.  History of Present Illness: Sue [name] is a 35 yo woman who is in today complaining of rectal bleeding. Started  ~ 2-3 mo ago. Blood on tissue and sometimes drips in toilet bowel. Intermittent. Also describes tenesmus. In addition,  started having abdominal pain  ~1 week ago. Pain constant, "crampy".  Not worsened or improved with food. No vomiting, diarrhea, constipation. Denies fever, chills, sick contacts. Of note, pt's mother (also a pt of mine) has  UC.     Prior Medications Reviewed Using: Patient Recall  Prior Medication List:  ALBUTEROL 90 MCG/ACT AERS (ALBUTEROL) Inhale 2 puffs four times a day as needed. SEASONALE 0.15-0.03 MG TABS (LEVONORGEST-ETH ESTRAD 91-DAY) Take 1 tablet by mouth once a day VALTREX 500 MG  TABS (VALACYCLOVIR HCL) As needed NORVASC 5 MG  TABS (AMLODIPINE BESYLATE) Take 1 tablet by mouth once a day   Current Allergies: ! SEPTRA ! AMOXICILLIN   Family History:    Mother - Ulcerative colitis   Risk Factors: Tobacco use:  never Alcohol use:  yes    Type:  occasional- beer Exercise:  no Seatbelt use:  100 %   Review of Systems  General      Denies chills, fever, sweats, and weight loss.  GI      Complains of  abdominal pain.      Denies constipation, diarrhea, hemorrhoids, loss of appetite, nausea, and vomiting.      see hpi  GU      Denies discharge and dysuria.  Derm      Denies rash.   Physical Exam  General:     alert, well-developed, well-nourished, and healthy-appearing.   Abdomen:     soft, normal bowel sounds, no distention, no masses, no guarding, and no rebound tenderness. mildly tender to palpation > LLQ  Rectal:     no external abnormalities, no hemorrhoids, normal sphincter tone, no masses, no fissures, and no fistulae.  No stool in vault, mucous heme neg Skin:     no rashes.      Impression & Recommendations:  Problem # 1:  RECTAL BLEEDING (ICD-569.3) Intermittent rectal bleeding with tenesmus, crampy abdominal pain and mother with UC - worrisome for UC in pt. No diarrhea/change in bowel habits makes UC less likely. However, will refer to GI for definitive work-up. Pt requests Dr Matthias Hughs as he is her mother's gastroenterologist. Will try to arrange.  Orders: Gastroenterology Referral (GI)   Complete Medication List: 1)  Albuterol  90 Mcg/act Aers (Albuterol) .... Inhale 2 puffs four times a day as needed. 2)  Seasonale 0.15-0.03 Mg Tabs (Levonorgest-eth estrad 91-day) .... Take 1 tablet by mouth once a day 3)  Valtrex 500 Mg Tabs (Valacyclovir hcl) .... As needed 4)  Norvasc 5 Mg Tabs (Amlodipine besylate) .... Take 1 tablet by mouth once a day   Patient Instructions: 1)  Please schedule a follow-up appointment in 1-2 months with Dr Harvie Junior. 2)  We will call you with your appointment with Dr Matthias Hughs.    ]

## 2010-04-08 NOTE — Assessment & Plan Note (Signed)
Summary: SCIATIC,MC   Vital Signs:  Patient profile:   35 year old female BP sitting:   128 / 84  Vitals Entered By: Lillia Pauls CMA (April 23, 2009 8:43 AM)  History of Present Illness: history of sciartica down left leg started 2 mos after delivery of child 3 years ago has tried PT and conservative care flexeril is helping some Stil has good and bad days with repeated episodes Last episode started 2 weeks ago after she moved. She felt as if her left leg was weak and would give out on her. She often has aching now in her left lower back and hip with numbness in her left foot. Diclofenac has not been as helpful. Lying on her right side makes her feel better. However, when she has pain any motion makes her pain worse.   Allergies: 1)  ! Septra 2)  ! Amoxicillin  Physical Exam  General:  alert and well-developed.   Head:  normocephalic and atraumatic.   Neck:  supple.   Lungs:  normal respiratory effort.   Msk:  Back: No scoliosis, bony abnormalities, edema or bruising Good forward flexion, pain with back extension, good side to side and rotation without pain Able to walk on her heels and toes, unsteady on her toes Mild TTP over the L4-S1 region Neg SLR bilaterally Normal sensation throughout her lower extremities 5/5 strength with resisted knee flexion and extension 5/5 strength with resisted big toe extension on the right but 4/5 strength with resisted big toe extension on the left   Impression & Recommendations:  Problem # 1:  SCIATICA (ICD-724.3) 1. Encourage good shoe wear since she is unsteady on her feet 2. Started on Gabapentin with increasing up to three times per day 3. Can continue flexeril as needed for pain 4. Given multiple back exercises to do to try to increase her ROM  5. Return in 4 weeks for follow-up   Updated medication list for this problem includes:    Flexeril 5 Mg Tabs (Cyclobenzaprine hcl) .Marland Kitchen... Take 1 tab by mouth at bedtime  Complete  Medication List: 1)  Albuterol 90 Mcg/act Aers (Albuterol) .... Inhale 2 puffs four times a day as needed. 2)  Seasonale 0.15-0.03 Mg Tabs (Levonorgest-eth estrad 91-day) .... Take 1 tablet by mouth once a day 3)  Valtrex 500 Mg Tabs (Valacyclovir hcl) .... As needed 4)  Norvasc 2.5 Mg Tabs (Amlodipine besylate) .... Take 1 tablet by mouth once a day 5)  Canasa 1000 Mg Supp (Mesalamine) .Marland Kitchen.. 1 supp at bedtime once a week 6)  Prevacid 30 Mg Cpdr (Lansoprazole) .... One by mouth daily 7)  Flexeril 5 Mg Tabs (Cyclobenzaprine hcl) .... Take 1 tab by mouth at bedtime 8)  Gabapentin 300 Mg Caps (Gabapentin) .Marland Kitchen.. 1 tab by mouth at bedtime for 5 days, then 1 tab by mouth two times a day for 5 days, then 1 tab by mouth three times a day afterwards  Patient Instructions: 1)  Take the gabapentin for the nerve pain as directed, increasing the dose as directed. 2)  Do 4-5 back exercises daily as given to you in the hand out. 3)  You can continue the flexeril as needed for spasms. When your back feels better, stop taking this medication. 4)  We will see you back in 4 weeks  Prescriptions: GABAPENTIN 300 MG CAPS (GABAPENTIN) 1 tab by mouth at bedtime for 5 days, then 1 tab by mouth two times a day for 5 days, then 1 tab  by mouth three times a day afterwards  #90 x 1   Entered by:   Jannifer Rodney MD   Authorized by:   Enid Baas MD   Signed by:   Jannifer Rodney MD on 04/23/2009   Method used:   Electronically to        [facility] [address] (retail)       [address]       [place], [place]  [id number]       Ph: [phone]       Fax: [phone]   RxID:   [id number]

## 2010-04-08 NOTE — Procedures (Signed)
[facility]   Imported By: Florinda Marker 12/26/2007 16:44:55  _____________________________________________________________________  External Attachment:    Type:   Image     Comment:   External Document

## 2010-04-08 NOTE — Progress Notes (Signed)
Summary: phone/gg  Phone Note Call from Patient   Caller: Patient Summary of Call: Pt called with c/o low back pain.  [facility]  She went to ED last night, and given pain med and told to return to [facility].   States she lost control of bladder.  SHe was told she needed repeat MRI. Will see pt today for ED f/u Initial call taken by: Merrie Roof RN,  January 02, 2010 12:14 PM

## 2010-04-08 NOTE — Assessment & Plan Note (Signed)
Summary: ROUTINE CK/VS   Vital Signs:  Patient Profile:   35 Years Old Female Height:     65 inches (165.10 cm) Weight:      162.7 pounds BMI:     27.17 Temp:     97.6 degrees F Pulse rate:   75 / minute BP sitting:   140 / 90  (right arm)  Pt. in pain?   no  Vitals Entered By: Filomena Jungling NT II (Jul 26, 2007 4:22 PM)              Is Patient Diabetic? No Nutritional Status BMI of 19 -24 = normal  Does patient need assistance? Functional Status Self care Ambulation Normal     Chief Complaint:  need redfill on ihaler/ blood pressure.  History of Present Illness: Sue [name] is a 35 yo woman who is in today for follow up of her blood pressure. She was started on Dyazide by her Gyn but was symptomatic (dizziness, palpitations). She was seen in [facility], Dyazide stopped and symptoms resolved. Currently on no BP med. In today for follow up of this.    Current Allergies: ! SEPTRA ! AMOXICILLIN    Risk Factors: Tobacco use:  never Alcohol use:  yes    Type:  occasional- beer Exercise:  no Seatbelt use:  100 %   Review of Systems  General      Denies weakness.  Eyes      Denies vision loss-both eyes.  CV      Denies palpitations.  Neuro      Denies headaches, poor balance, tingling, and weakness.   Physical Exam  General:     alert, well-developed, and normal appearance.   Lungs:     normal respiratory effort and normal breath sounds.   Heart:     normal rate and regular rhythm.      Impression & Recommendations:  Problem # 1:  HYPERTENSION (ICD-401.9) BP 140/ 90. I discussed starting HCTZ alone but pt does not want to try this. Therefore, will start Norvasc. Start at 2.5mg . RTC in 2 weeks for BP check and increase dose if needed. Her updated medication list for this problem includes:    Norvasc 2.5 Mg Tabs (Amlodipine besylate) .Marland Kitchen... Take 1 tablet by mouth once a day   Complete Medication List: 1)  Albuterol 90 Mcg/act Aers (Albuterol) .... Inhale 2  puffs four times a day as needed. 2)  Seasonale 0.15-0.03 Mg Tabs (Levonorgest-eth estrad 91-day) .... Take 1 tablet by mouth once a day 3)  Valtrex 500 Mg Tabs (Valacyclovir hcl) .... As needed 4)  Norvasc 2.5 Mg Tabs (Amlodipine besylate) .... Take 1 tablet by mouth once a day   Patient Instructions: 1)  Please schedule a follow-up appointment in 2 weeks for a blood pressure check. 2)  Pick up your new blood pressure medicine (Norvasc) at the pharmacy and start taking it once a day.   Prescriptions: ALBUTEROL 90 MCG/ACT AERS (ALBUTEROL) Inhale 2 puffs four times a day as needed.  #1 mo supply x 11   Entered and Authorized by:   Harriett Sine Cherica Heiden MD   Signed by:   Harriett Sine Demyan Fugate MD on 07/26/2007   Method used:   Electronically sent to ...       [id number]       [address] [facility]  [address]       [place], [place]  [address]  Ph: [phone] or [phone]       Fax: [phone]   RxID:   [id number] NORVASC 2.5 MG  TABS (AMLODIPINE BESYLATE) Take 1 tablet by mouth once a day  #30 x 6   Entered and Authorized by:   Ned Grace MD   Signed by:   Ned Grace MD on 07/26/2007   Method used:   Electronically sent to ...       [id number]       [address]       [place], [place]  [address]       Ph: [phone] or [phone]       Fax: [phone]   RxID:   [id number]  ]

## 2010-04-08 NOTE — Consult Note (Signed)
Summary: [facility]   Imported By: Florinda Marker 04/10/2008 11:09:34  _____________________________________________________________________  External Attachment:    Type:   Image     Comment:   External Document

## 2010-04-08 NOTE — Progress Notes (Signed)
Summary: phone/gg  Phone Note Call from Patient   Caller: Patient Summary of Call: Pt called and states she is shedding alot of hair.  It started after you changed her Norvasc from 2.5 to 5 mg on 6/9.  She would like to be changed back to 2.5 mg  Initial call taken by: Merrie Roof RN,  September 26, 2007 4:05 PM  Follow-up for Phone Call        She can go back to 2.5mg  but we will likely need to add a second medicine to control her BP. I doubt the hair loss is due to the Norvasc. We can discuss when I see her next. Follow-up by: Ned Grace MD,  September 26, 2007 5:26 PM  Additional Follow-up for Phone Call Additional follow up Details #1::        Pt called, no answer...Marland KitchenMarland KitchenMarland Kitchenmessage left  7/21 @ 1000  09/28/07 - talked with pt and informed of above Additional Follow-up by: Merrie Roof RN,  September 28, 2007 11:10 AM

## 2010-04-08 NOTE — Assessment & Plan Note (Signed)
Summary: RECK BP/OK PER [name]/VS   Vital Signs:  Patient Profile:   35 Years Old Female Height:     65 inches (165.10 cm) Weight:      161.7 pounds BMI:     27.01 Temp:     98.9 degrees F oral Pulse rate:   73 / minute BP sitting:   135 / 87  (right arm)  Vitals Entered By: Sue [name] NT II (August 16, 2007 1:34 PM)                 Chief Complaint:  FOLLOW-UP BLOOD PRESSURE.  History of Present Illness: Sue [name] is a 35 yo woman with newly diagnosed HTN. Tried on Dyazid but said it made her dizzy and gave her palpitations. Unwilling to try HCTZ alone. Therefore, started on Norvasc. In for BP check. Taking med as directed with no side effects.    Current Allergies: ! SEPTRA ! AMOXICILLIN      Physical Exam  General:     alert, well-developed, and healthy-appearing.      Impression & Recommendations:  Problem # 1:  HYPERTENSION (ICD-401.9) BP 135/87. Will increase Norvasc to 5mg  once daily. Rx sent. RTC 2-3 months for follow up. Will check her BP at home and call if consistently high. Her updated medication list for this problem includes:    Norvasc 5 Mg Tabs (Amlodipine besylate) .Marland Kitchen... Take 1 tablet by mouth once a day   Complete Medication List: 1)  Albuterol 90 Mcg/act Aers (Albuterol) .... Inhale 2 puffs four times a day as needed. 2)  Seasonale 0.15-0.03 Mg Tabs (Levonorgest-eth estrad 91-day) .... Take 1 tablet by mouth once a day 3)  Valtrex 500 Mg Tabs (Valacyclovir hcl) .... As needed 4)  Norvasc 5 Mg Tabs (Amlodipine besylate) .... Take 1 tablet by mouth once a day   Patient Instructions: 1)  Please schedule a follow-up appointment in 2-3 months. 2)  Pick up the new prescription for Norvasc 5 mg and take it once a day.    Prescriptions: NORVASC 5 MG  TABS (AMLODIPINE BESYLATE) Take 1 tablet by mouth once a day  #31 x 11   Entered and Authorized by:   Ned Grace MD   Signed by:   Ned Grace MD on 08/16/2007   Method used:   Electronically  sent to ...       [id number]       [address]       [place], [place]  [address]       Ph: [phone] or [phone]       Fax: [phone]   RxID:   [id number]  ]

## 2010-04-08 NOTE — Consult Note (Signed)
Summary: [facility]   Imported By: Florinda Marker 03/12/2009 16:11:06  _____________________________________________________________________  External Attachment:    Type:   Image     Comment:   External Document

## 2010-04-08 NOTE — Assessment & Plan Note (Signed)
Summary: PER [name]/SB.   Vital Signs:  Patient Profile:   35 Years Old Female Height:     65 inches (165.10 cm) Weight:      157.3 pounds (71.50 kg) BMI:     26.27 Temp:     96.9 degrees F (36.06 degrees C) oral Pulse rate:   84 / minute BP sitting:   135 / 83  (left arm)  Pt. in pain?   no  Vitals Entered By: Stanton Kidney Ditzler RN (January 30, 2008 11:29 AM)              Is Patient Diabetic? No Nutritional Status BMI of 25 - 29 = overweight Nutritional Status Detail appetite good  Have you ever been in a relationship where you felt threatened, hurt or afraid?denies   Does patient need assistance? Functional Status Self care Ambulation Normal     Chief Complaint:  Past 1 1/2 months constantly clearing  throat and voice has gotten deeper..  History of Present Illness: 35 yo F with PMH outlined below who was last seen in the [facility] on 11/05/07 by Dr. 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  She is here today for burning pain in back of throat about 3/10, not constant, lasting about 1 min, many times every day. Hasn't tried any meds for it yet.   21mo ago flu like illness, 1.27mo ago urgent care visit for asthma exac. afterwards lost voice for a week or two, then voice came back. For about last month has had voice changes  -- deepening (people on phone confuse her for son).   thinks maybe post nasal drip, although she isn't sure if she is having post-nasal drip. No exacerbating factors.   Reports had waterbrash in the past before she started taking prevacid, but now that she takes prevacid as needed (approx 3/wk), she does not have waterbrash anymore.    Prior Medications Reviewed Using: Patient Recall  Updated Prior Medication List: ALBUTEROL 90 MCG/ACT AERS (ALBUTEROL) Inhale 2 puffs four times a day as needed. SEASONALE 0.15-0.03 MG TABS (LEVONORGEST-ETH ESTRAD 91-DAY) Take 1 tablet by mouth once a day VALTREX 500 MG  TABS  (VALACYCLOVIR HCL) As needed NORVASC 2.5 MG TABS (AMLODIPINE BESYLATE) Take 1 tablet by mouth once a day  (Med list said 5mg ; Pt reports this was reduced from 5mg , although I cannot find where this was done, but she says she is taking 2.5mg  tabs so I have changed it.) CANASA 1000 MG SUPP (MESALAMINE) 1 at bedtime PREVACID 30 MG CPDR (LANSOPRAZOLE) one by mouth daily as needed for heartburn  Current Allergies (reviewed today): ! SEPTRA ! AMOXICILLIN  Past Medical History:    Reviewed history from 02/08/2006 and no changes required:       Asthma, mild by PFT's  01/15/1999       Sciatica 01/12/06       Complete spontaneous abortion x 2       Normal vaginal delievery x 3       Cervical lymphadenopathy/ biopsy neg  [name]       Buttock wound, left buttock        History of abnormal papsmear s/p cryo (Dr.Newell)       Atypical chest pain  Past Surgical History:    Bx of lymph node in R neck approx 2007    Social History:    does not smoke    occasional beer on weekends   Risk Factors: Tobacco  use:  never Alcohol use:  yes    Type:  occasional- beer Exercise:  no Seatbelt use:  100 %   Review of Systems  General      Denies chills and fever.  ENT      See HPI      Complains of hoarseness.      Denies decreased hearing and difficulty swallowing.  CV      Denies chest pain or discomfort and palpitations.  Resp      Complains of cough.      Denies shortness of breath.  GI      Denies constipation and diarrhea.  GU      Denies dysuria.  MS      Denies joint pain and muscle aches.  Derm      Denies rash.  Neuro      Denies numbness and weakness.   Physical Exam  General:     alert and well-developed.   Head:     normocephalic and atraumatic.   Eyes:     vision grossly intact, pupils equal, pupils round, and pupils reactive to light.   Ears:     no external deformities.   Nose:     no external deformity.   Mouth:     good dentition, pharynx  pink and moist, and pharyngeal erythema.   Neck:     supple, no masses, no thyromegaly, and no thyroid nodules or tenderness.   Lungs:     normal respiratory effort, normal breath sounds, no crackles, and no wheezes.   Heart:     normal rate, regular rhythm, no murmur, no gallop, and no rub.   Abdomen:     soft, non-tender, normal bowel sounds, no distention, no masses, no guarding, and no rigidity.   Neurologic:     alert & oriented X3, cranial nerves II-XII intact, strength normal in all extremities, and gait normal.      Impression & Recommendations:  Problem # 1:  SORE THROAT (ICD-462) Her history is highly suggestive of viral laryngitis / pharyngitis, perhaps complicated by post-viral cough. It is also possible that she does have post-nasal discharge which I will try to treat with loratidine. In case she is still having some irritation from GERD, will increase her prilosec to daily dosing for one month. (See below)  Orders: T-TSH ([id number]) T-T4, Free [phone])   Problem # 2:  OTHER VOICE AND RESONANCE DISORDERS (ICD-784.49) Most likely this is the result of viral layngitis, perhaps complcated by chronic cough and also perhaps reflux. Therefore will treat presumptively with loratadine for possible post-nasal drip and daily prevacid for reflux. She will also avoid alcohol for 1 month. If these are not effective in one month, she will call for an appointment, and likely will need referral to ENT. Otherwise, if this conservative management works, she will try stopping the loratidine, and if she does ok, try reducing the prevacid to as needed dosing.   In addition, will check thyroid tests to make sure there is no thyroid disease causing these symptoms.   Orders: T-TSH ([id number]) T-T4, Free ([id number])   Problem # 3:  HYPERTENSION (ICD-401.9) BP acceptable today, so continue:  Her updated medication list for this problem includes:    Norvasc 2.5 Mg Tabs (Amlodipine  besylate) .Marland Kitchen... Take 1 tablet by mouth once a day  BP today: 135/83 Prior BP: 135/90 (11/04/2007)  Labs Reviewed: Creat: 0.99 (06/15/2007)   Complete Medication List: 1)  Albuterol 90 Mcg/act Aers (Albuterol) .Marland KitchenMarland KitchenMarland Kitchen  Inhale 2 puffs four times a day as needed. 2)  Seasonale 0.15-0.03 Mg Tabs (Levonorgest-eth estrad 91-day) .... Take 1 tablet by mouth once a day 3)  Valtrex 500 Mg Tabs (Valacyclovir hcl) .... As needed 4)  Norvasc 2.5 Mg Tabs (Amlodipine besylate) .... Take 1 tablet by mouth once a day 5)  Canasa 1000 Mg Supp (Mesalamine) .Marland Kitchen.. 1 at bedtime 6)  Prevacid 30 Mg Cpdr (Lansoprazole) .... One by mouth daily 7)  Loratadine 10 Mg Tabs (Loratadine) .... Once daily as needed allergies   Patient Instructions: 1)  Please schedule a follow-up appointment in 3 months. 2)  If your voice and your burning in your throat is not better in 1 month, please call for an appointment. 3)  Take prevacid and loratidine daily for one month. 4)  After one month,  if you are better, stop taking loratidine and see if you continue to do well. If you are doing ok without the loratidine, try using the prevacid only as needed.   Prescriptions: LORATADINE 10 MG  TABS (LORATADINE) once daily as needed allergies  #30 x 12   Entered and Authorized by:   Loel Dubonnet MD   Signed by:   Loel Dubonnet MD on 01/30/2008   Method used:   Print then Give to Patient   RxID:   [id number]  ]

## 2010-04-08 NOTE — Letter (Signed)
Summary: Out of Work  [facility]  [address]   [place], [place] [address]   Phone: [phone]  Fax: [phone]    January 06, 2010   Employee:  LACHANDA BUCZEK Hinger    To Whom It May Concern:   For Medical reasons, please excuse the above named employee from work for the following dates:  Start: 01/06/10    End:  01/14/10  If you need additional information, please feel free to contact our office.         Sincerely,    Dr. Karleen Hampshire Copland

## 2010-04-08 NOTE — Consult Note (Signed)
Summary: [facility]   Imported By: Florinda Marker 07/10/2008 15:54:27  _____________________________________________________________________  External Attachment:    Type:   Image     Comment:   External Document

## 2010-04-08 NOTE — Progress Notes (Signed)
Summary: Prior Authorization-Prevacid--Denial  Phone Note Outgoing Call   Call placed by: Angelina Ok RN,  June 27, 2009 11:34 AM Call placed to: Insurer Summary of Call: Call for Prior Authorization for Prevacid.  Denial pt will need to try Omeprazole or Nexium first. Angelina Ok RN  June 27, 2009 11:35 AM  Initial call taken by: Angelina Ok RN,  June 27, 2009 11:35 AM  Follow-up for Phone Call        Medication change to nexium 40mg  daily.    New/Updated Medications: NEXIUM 40 MG CPDR (ESOMEPRAZOLE MAGNESIUM) Take 1 capsule by mouth once a day Prescriptions: NEXIUM 40 MG CPDR (ESOMEPRAZOLE MAGNESIUM) Take 1 capsule by mouth once a day  #31 x 3   Entered and Authorized by:   Vassie Loll MD   Signed by:   Vassie Loll MD on 07/01/2009   Method used:   Electronically to        [facility] [phone]* (retail)       [address]       [place], [place]  [address]       Ph: [phone]       Fax: [phone]   RxID:   [id number]

## 2010-04-08 NOTE — Assessment & Plan Note (Signed)
Summary: HFU/DS   Vital Signs:  Patient profile:   35 year old female Height:      65 inches (165.10 cm) Weight:      150.5 pounds (68.41 kg) BMI:     25.14 Temp:     97.8 degrees F (36.56 degrees C) oral Pulse rate:   77 / minute BP sitting:   133 / 82  (right arm)  Vitals Entered By: Stanton Kidney Ditzler RN (January 21, 2010 8:41 AM) Is Patient Diabetic? No Pain Assessment Patient in pain? no      Nutritional Status BMI of 25 - 29 = overweight Nutritional Status Detail appetite good  Have you ever been in a relationship where you felt threatened, hurt or afraid?denies   Does patient need assistance? Functional Status Self care Ambulation Normal Comments HFU - finish Prednisone 01/20/10.   History of Present Illness: 35 y/o woman with PMH of lumbar disc herniation in 2007( followed by Dr. Darrick Penna and [facility]) and sciatica got recently d/c from hospital on oct 28th ,when there was a concern for cauda equina syndrome with her symptoms of sciatica and urinary incontinence, comes for a hospital f/u.  Her MRI was negative for neural compression or significant stenosis. She was treated with cipro and tapering dose of prednisone for this episode. She was seen by Dr. Patsy Lager on 01/14/10 for a f/u appointment.  Today she reports feeling better. Her pain is well controlled with pain meds hat she takes as needed. She denies any back pain, numbness, paresthesias, sciatica, urinary incontinence.    Depression History:      The patient denies a depressed mood most of the day and a diminished interest in her usual daily activities.         Preventive Screening-Counseling & Management  Alcohol-Tobacco     Alcohol type: occasional- beer     Smoking Status: never  Caffeine-Diet-Exercise     Does Patient Exercise: no  Current Medications (verified): 1)  Albuterol 90 Mcg/act Aers (Albuterol) .... Inhale 2 Puffs Four Times A Day As Needed. 2)  Seasonale 0.15-0.03 Mg Tabs  (Levonorgest-Eth Estrad 91-Day) .... Take 1 Tablet By Mouth Once A Day 3)  Valtrex 500 Mg  Tabs (Valacyclovir Hcl) .... As Needed 4)  Norvasc 2.5 Mg Tabs (Amlodipine Besylate) .... Take 1 Tablet By Mouth Once A Day 5)  Canasa 1000 Mg Supp (Mesalamine) .Marland Kitchen.. 1 Supp At Bedtime Once A Week 6)  Nexium 40 Mg Cpdr (Esomeprazole Magnesium) .... Take 1 Capsule By Mouth Once A Day 7)  Amitriptyline Hcl 25 Mg Tabs (Amitriptyline Hcl) .Marland Kitchen.. 1 By Mouth At Bedtime 8)  Tramadol Hcl 50 Mg Tabs (Tramadol Hcl) .Marland Kitchen.. 1 By Mouth Qid As Needed Pain 9)  Prednisone 10 Mg  Tabs (Prednisone) .... 4 Tabs By Mouth For 6 Days, Then 3 Tabs By Mouth For 4 Days, Then 2 Tabs By Mouth For 4 Days, Then 1 Tab By Mouth For 4 Days 10)  Hydrocodone-Acetaminophen 5-500 Mg Tabs (Hydrocodone-Acetaminophen) .Marland Kitchen.. 1 By Mouth Q 6 Hours As Needed Pain 11)  Tizanidine Hcl 4 Mg Tabs (Tizanidine Hcl) .Marland Kitchen.. 1 By Mouth At Bedtime As Needed Muscle Spasm  Allergies: 1)  ! Septra 2)  ! Amoxicillin  Review of Systems      See HPI  Physical Exam  General:  alert, well-developed, and well-nourished.   Head:  normocephalic and atraumatic.   Eyes:  vision grossly intact, pupils equal, pupils round, and pupils reactive to light.  Mouth:  pharynx pink and moist.   Neck:  supple, full ROM, and no masses.   Lungs:  normal respiratory effort, no intercostal retractions, no accessory muscle use, normal breath sounds, no dullness, no fremitus, and no crackles.   Heart:  normal rate, regular rhythm, no murmur, no gallop, no rub, and no JVD.   Abdomen:  soft, non-tender, normal bowel sounds, no distention, no masses, no guarding, and no rigidity.   Msk:  SLRT was negative. Pulses:  2+ pulses b/l. Extremities:  no cyanosis, no clubbing Neurologic:  alert & oriented X3, cranial nerves II-XII intact, strength normal in all extremities, sensation intact to light touch, sensation intact to pinprick, gait normal, and DTRs symmetrical and normal.      Impression & Recommendations:  Problem # 1:  HERNIATED DISC (ICD-722.2) Assessment Unchanged She was recently hospitalized with high suspicion for cauda equina syndrome when she presented with symptoms consistent with sciatica and urinary incontinence. Her MRI on 11/ which was negative for neural cord compression or significant spinal stenosis and cauda equina was ruled out.. She completed her course of tapering dose of prednisone and antibiotics that she was prescribed at the time ofher d/c. She was seen by Dr. Dallas Schimke on 01/14/10 for her sciatica nd disc protrusion. She reports no back pain,  radiculopathy numbness or urinary incontinence. Her pain is well controlled with the pain meds.  Problem # 2:  HYPERTENSION (ICD-401.9) Assessment: Improved Her BP ids fairl well controlled with the current dose. Continue the same. Her updated medication list for this problem includes:    Norvasc 2.5 Mg Tabs (Amlodipine besylate) .Marland Kitchen... Take 1 tablet by mouth once a day  Problem # 3:  Preventive Health Care (ICD-V70.0) She refused the vaccinations today. She says that she got her pap smear this year which was normal.  Complete Medication List: 1)  Albuterol 90 Mcg/act Aers (Albuterol) .... Inhale 2 puffs four times a day as needed. 2)  Seasonale 0.15-0.03 Mg Tabs (Levonorgest-eth estrad 91-day) .... Take 1 tablet by mouth once a day 3)  Valtrex 500 Mg Tabs (Valacyclovir hcl) .... As needed 4)  Norvasc 2.5 Mg Tabs (Amlodipine besylate) .... Take 1 tablet by mouth once a day 5)  Canasa 1000 Mg Supp (Mesalamine) .Marland Kitchen.. 1 supp at bedtime once a week 6)  Nexium 40 Mg Cpdr (Esomeprazole magnesium) .... Take 1 capsule by mouth once a day 7)  Amitriptyline Hcl 25 Mg Tabs (Amitriptyline hcl) .Marland Kitchen.. 1 by mouth at bedtime 8)  Tramadol Hcl 50 Mg Tabs (Tramadol hcl) .Marland Kitchen.. 1 by mouth qid as needed pain 9)  Prednisone 10 Mg Tabs (Prednisone) .... 4 tabs by mouth for 6 days, then 3 tabs by mouth for 4 days, then 2  tabs by mouth for 4 days, then 1 tab by mouth for 4 days 10)  Hydrocodone-acetaminophen 5-500 Mg Tabs (Hydrocodone-acetaminophen) .Marland Kitchen.. 1 by mouth q 6 hours as needed pain 11)  Tizanidine Hcl 4 Mg Tabs (Tizanidine hcl) .Marland Kitchen.. 1 by mouth at bedtime as needed muscle spasm  Patient Instructions: 1)  Please schedule a follow-up appointment in 2 months. 2)  Please take your medicines as prescribed. 3)  Please come to the clinic or f/u with your sports medicine doctor if your symptoms of back pain get worse.      Prevention & Chronic Care Immunizations   Influenza vaccine: Not documented   Influenza vaccine deferral: Refused  (01/21/2010)    Tetanus booster: Not documented   Td booster deferral:  Refused  (01/21/2010)    Pneumococcal vaccine: Not documented  Other Screening   Pap smear: Not documented   Smoking status: never  (01/21/2010)  Hypertension   Last Blood Pressure: 133 / 82  (01/21/2010)   Serum creatinine: 0.85  (04/11/2009)   Serum potassium 4.1  (04/11/2009)  Self-Management Support :   Personal Goals (by the next clinic visit) :      Personal blood pressure goal: 140/90  (01/21/2010)   Patient will work on the following items until the next clinic visit to reach self-care goals:     Medications and monitoring: take my medicines every day, bring all of my medications to every visit, weigh myself weekly  (01/21/2010)     Eating: eat more vegetables, use fresh or frozen vegetables, eat fruit for snacks and desserts  (01/21/2010)     Activity: take a 30 minute walk every day  (01/21/2010)    Hypertension self-management support: Written self-care plan, Education handout, Resources for patients handout  (01/21/2010)   Hypertension self-care plan printed.   Hypertension education handout printed      Resource handout printed.

## 2010-04-08 NOTE — Letter (Signed)
Summary: Out of Work  [facility]  [address]   [place], [place] [address]   Phone: [phone]  Fax: [phone]    December 31, 2009   Employee:  Sue [name]    To Whom It May Concern:   For Medical reasons, please excuse the above named employee from work for the following dates:  Start:   12/31/09  End:   01/07/10  If you need additional information, please feel free to contact our office.         Sincerely,    Lillia Pauls CMA

## 2010-04-08 NOTE — Initial Assessments (Signed)
INTERNAL MEDICINE ADMISSION HISTORY AND PHYSICAL  First Contact: Amanjot Sidhu [phone]) Second Contact: Dr. Anselm Jungling [phone]  PCP: None  CC: Urinary Incontinence  HPI:Ms. Wisdom is a 35 year old woman with pmh significant for lower back pain, HTN, Sciatica and asthma who presents to the clinic for worsening lower back pain.  Dr. Cyndie Chime note 10/25 She was seen as an acute walk in  in the [facility] Center for acute onset of back pain, 3 days prior with severe, lightninglike pain and  some radiculopathy, most notably on the LEFT, with some lesser symptoms on the RIGHT.She had difficulty walking, and walked in with a crutch to the office.  She is having severe pain, requiring tramadol, and some occasional Vicodin. She has had disc herniation in the past, which did resolve conservatively.From an occupational standpoint, the patient works on her feet all day in department store.  Patient reports going to the ED last night, because she lost control of her bladder. She states she stood up when she noticed herself to be urinating, and that she was unable to stop it. She denies bowel incontinence, saddle seat paresthesias or numbness/tingling/extremity weakness. Patient states pain is aggrevated by movement, especially movement of left leg. Patient had a complete physical exam done in the ED, where it was noted that patient had pain on left leg raise, with normal sphincter, anal tone. Patient was given Dilaudid and Valium and was discharged home. No further imaging studies were done.  Patient returns today because she continues to be incontinent, along with worsening paresthesia in her left foot.    ALLERGIES:  ! SEPTRA ! AMOXICILLIN   PAST MEDICAL HISTORY:  Asthma, mild by PFT's  12/20/2007 Sciatica 01/12/06 Complete spontaneous abortion x 2 Normal vaginal delievery x 3 Cervical lymphadenopathy/ biopsy neg  [name] Buttock wound, left buttock  History of abnormal papsmear s/p cryo  (Dr.Newell) Atypical chest pain  Hx of low back pain with MRI in Dec 2007 which read: No focal disc protrusions, spinal or foraminal stenosis.  Minimal bulging discs at L4-5 and L5-S1.Mild facet degenerative changes but no pars defects. 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 2009  MEDICATIONS:  ALBUTEROL 90 MCG/ACT AERS (ALBUTEROL) Inhale 2 puffs four times a day as needed. SEASONALE 0.15-0.03 MG TABS (LEVONORGEST-ETH ESTRAD 91-DAY) Take 1 tablet by mouth once a day VALTREX 500 MG  TABS (VALACYCLOVIR HCL) As needed NORVASC 2.5 MG TABS (AMLODIPINE BESYLATE) Take 1 tablet by mouth once a day CANASA 1000 MG SUPP (MESALAMINE) 1 Supp at bedtime once a week NEXIUM 40 MG CPDR (ESOMEPRAZOLE MAGNESIUM) Take 1 capsule by mouth once a day AMITRIPTYLINE HCL 25 MG TABS (AMITRIPTYLINE HCL) 1 by mouth at bedtime TRAMADOL HCL 50 MG TABS (TRAMADOL HCL) 1 by mouth qid as needed pain PREDNISONE 10 MG  TABS (PREDNISONE) 4 tabs by mouth for 6 days, then 3 tabs by mouth for 4 days, then 2 tabs by mouth for 4 days, then 1 tab by mouth for 4 days HYDROCODONE-ACETAMINOPHEN 5-500 MG TABS (HYDROCODONE-ACETAMINOPHEN) 1 by mouth q 6 hours as needed pain TIZANIDINE HCL 4 MG TABS (TIZANIDINE HCL) 1 by mouth at bedtime as needed muscle spasm   SOCIAL HISTORY:  does not smoke occasional beer on weekends  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 Is on her feet all day. Recently was helping with unloading a truck a few days ago.  FAMILY HISTORY   Mother - Ulcerative colitis   ROS:  As per HPI  VITALS: T:                   P:                      BP:                              R:                     O2SAT:  ON:  PHYSICAL EXAM:  General:  alert and well-developed.  VS reviewed, and within normal limits, patient is in tears due to pain and level of functionality  Head:  normocephalic and atraumatic.   Neck:  supple and full ROM.   Lungs:  normal respiratory effort and normal breath  sounds.   Heart:  normal rate and regular rhythm.   Abdomen:  soft and non-tender.   Msk:  decreased range of motion secondary to pain no joint warmth, or swelling Pulses:  pulses were equal 2+ DP bilaterally Extremities:  no edema noted Neurologic:  alert & oriented X3 and cranial nerves II-XII intact.   left lower extremity had slightly dimished strength when compared to right, but almost equal gait was ataxic pain when left leg was raised, unable to complete entire leg raise exam, as patient was in significant amount of pain no saddle anesthesia noted  reflexes not done due to level of distress Psych:  withdrawn, labile affect, and tearful.    LABS:   WBC                                      7.3               4.0-10.5         K/uL  RBC                                      3.97              3.87-5.11        MIL/uL  Hemoglobin (HGB)                 12.0              12.0-15.0        g/dL  Hematocrit (HCT)                   35.5       l      36.0-46.0        %  MCV                                      89.4              78.0-100.0       fL  MCH -                                    30.2              26.0-34.0  pg  MCHC                                    33.8              30.0-36.0        g/dL  RDW                                      14.0              11.5-15.5        %  Platelet Count (PLT)                 262               150-400          K/uL    ASSESSMENT AND PLAN:  1) Acute Low back pain - The differential for low back pain in this patient includes sciatica, cauda equina syndrome, spinal stenosis, and severe lumbar strain. Given acuity of patient's symptoms and new onset of bladder incontinence, cauda equina syndrome is of great concern. Due to the inability to obtain imaging tests while here in the clinic, will admit inpatient MRI and observation. Patient does have a history of sciatica, and there was positive leg raise sign, but with urinary incontinence, am more concerned  about cauda equina syndrome. In looking at cauda equina syndrome, patient does endorse urinary incontinence, but denies bowel incontinence, as well as saddle anesthesias, and she complains more of unilateral weakness rather than bilateral weakness. Spinal stenosis by nerve root entrapment may also be contributing to patient symptoms. She does have a history of bulging discs from prior MRI in 2007 which may have gotten worse since then. Also of note, Dr. Cyndie Chime note from 10/25 also indicated that patient may be suffering from acute disc herniation. There is no suspicion for infectious etiology. Pt has been afebrile, with no history of IV drug use. There should also be special mention of patient having a diagnosis of ulcerative proctitis in Oct.2009. Patient had a flex sig at that time which showed inflammation consistent with ulcerative proctitis and was given mesalasmine suppositories and the inflammation as well as rectal bleeding had resolved. Although less likely as a cause, if patient does have ulcerative proctitis and even colitis, this may be associated with Ankylosing Spondylitis. It is possible in this female with family history of UC, as well as herself having ulcerative proctitis to have an associated autoimmune condition. Although this has not been evaluated in the past.   Plan: -Will admit for pain control, and further imaging studies including non-contrasted MRI of lumbar spine -MRI is test of choice to rule out cauda equina syndrome, as well as further evaluate for disc herniation causing spinal stenosis, and Ankylosing Spondylitis, and rule out osteomyelitis  -Depending on MRI results, may consult NeuroSx vs Ortho for further management -Will continue prednisone, as patient was on this previously prescribed by sports medicine, may change this to IV Dexamethasone, depending on MRI -Check UA -Check CBC  -Pain control with Morphine and Ativan   2) Urinary incontinence - likely related to  problem number one.   Will check UA and bladder scan to evaluate for urinary retention.   3) HTN - Continue Norvasc  4) VTE PROPH: lovenox  Attending  Physician:  I performed and/or observed a history and physical examination of the patient.  I discussed the case with the residents as noted and reviewed the residents' notes.  I agree with the findings and plan--please refer to the attending physician note for more details.  Signature   Printed Name

## 2010-04-08 NOTE — Miscellaneous (Signed)
  Clinical Lists Changes  Impression & Recommendations:  Problem # 1:  ASTHMA (ICD-493.90) PFT's on 12/20/07 nl. Therefore, will order methacholine challenge in attempt to confirm dx of asthma. Her updated medication list for this problem includes:    Albuterol 90 Mcg/act Aers (Albuterol) ..... Inhale 2 puffs four times a day as needed.  Future Orders: Methocholine Challenge (Methocholine) ... 01/02/2008

## 2010-04-08 NOTE — Consult Note (Signed)
Summary: Dermatology-Dr. Terri Piedra  Dermatology-Dr. Terri Piedra   Imported By: Dorice Lamas 08/06/2006 11:23:07  _____________________________________________________________________  External Attachment:    Type:   Image     Comment:   External Document

## 2010-04-08 NOTE — Assessment & Plan Note (Signed)
Summary: 9 AM APPT PER [facility]   Vital Signs:  Patient profile:   35 year old female BP sitting:   146 / 94  Vitals Entered By: Rochele Pages RN (January 14, 2010 8:51 AM)   History of Present Illness: 35 year old female seen in followup for acute probable disc herniation with severe sciatica symptoms.  In the meantime, the patient actually was admitted with incontinence for concern of potential cauda equina, but had a MRI some L5-S1 stenosisbut in looking at the MRI, I agree with the radiologist in that there is certainly no signs of any left-sided neural compression and only a minimal degree of right-sided L5-S1 stenosis.  Regardless, the patient is markedly better today compared to my prior examination. She describes is at about 85% better or better  No radiculopathy, numbness.  She has not had to take any significant amount of pain meds, and she is towards the end of her steroid taper.  Allergies: 1)  ! Septra 2)  ! Amoxicillin  Past History:  Past medical, surgical, family and social histories (including risk factors) reviewed, and no changes noted (except as noted below).  Past Medical History: Reviewed history from 01/30/2008 and no changes required. Asthma, mild by PFT's  01/15/1999 Sciatica 01/12/06 Complete spontaneous abortion x 2 Normal vaginal delievery x 3 Cervical lymphadenopathy/ biopsy neg  [name] Buttock wound, left buttock  History of abnormal papsmear s/p cryo (Dr.Newell) Atypical chest pain  Past Surgical History: Reviewed history from 01/30/2008 and no changes required. Bx of lymph node in R neck approx 2007   Family History: Reviewed history from 11/04/2007 and no changes required. Mother - Ulcerative colitis  Social History: Reviewed history from 01/30/2008 and no changes required. does not smoke occasional beer on weekends  Review of Systems       as above  Physical Exam  General:  GEN: Well-developed,well-nourished,in no acute  distress; alert,appropriate and cooperative throughout examination HEENT: Normocephalic and atraumatic without obvious abnormalities. No apparent alopecia or balding. Ears, externally no deformities PULM: Breathing comfortably in no respiratory distress EXT: No clubbing, cyanosis, or edema PSYCH: Normally interactive. Cooperative during the interview. Pleasant. Friendly and conversant. Not anxious or depressed appearing. Normal, full affect.  Msk:  flexion, extension, lateral bending, rotation all within normal limits. Straight leg raises negative. Negative log roll.  Normal Greater trochanteric bursae Full hip ROM Negative Faber Pos Reverse Faber Sciatic Notches: NT Sensation to Gross touch WNL Sensation to pinpricnk WNL DTR 2+ knee and ankle no clonus DP and PT pulses are normal B    Impression & Recommendations:  Problem # 1:  BACK PAIN, LUMBAR (ICD-724.2) Assessment Improved much better tight at piriformis, work on hip flexibility which was reviewed, direct palpation, direct massage  would anticipate her continuing to improve placed on some weight limitations at work - her impression is that much other work is available, and i think that is a reasonable accomodation.   c/w current POC, f/u 1 mo  Her updated medication list for this problem includes:    Tramadol Hcl 50 Mg Tabs (Tramadol hcl) .Marland Kitchen... 1 by mouth qid as needed pain    Hydrocodone-acetaminophen 5-500 Mg Tabs (Hydrocodone-acetaminophen) .Marland Kitchen... 1 by mouth q 6 hours as needed pain    Tizanidine Hcl 4 Mg Tabs (Tizanidine hcl) .Marland Kitchen... 1 by mouth at bedtime as needed muscle spasm  Complete Medication List: 1)  Albuterol 90 Mcg/act Aers (Albuterol) .... Inhale 2 puffs four times a day as needed. 2)  Seasonale  0.15-0.03 Mg Tabs (Levonorgest-eth estrad 91-day) .... Take 1 tablet by mouth once a day 3)  Valtrex 500 Mg Tabs (Valacyclovir hcl) .... As needed 4)  Norvasc 2.5 Mg Tabs (Amlodipine besylate) .... Take 1 tablet by  mouth once a day 5)  Canasa 1000 Mg Supp (Mesalamine) .Marland Kitchen.. 1 supp at bedtime once a week 6)  Nexium 40 Mg Cpdr (Esomeprazole magnesium) .... Take 1 capsule by mouth once a day 7)  Amitriptyline Hcl 25 Mg Tabs (Amitriptyline hcl) .Marland Kitchen.. 1 by mouth at bedtime 8)  Tramadol Hcl 50 Mg Tabs (Tramadol hcl) .Marland Kitchen.. 1 by mouth qid as needed pain 9)  Prednisone 10 Mg Tabs (Prednisone) .... 4 tabs by mouth for 6 days, then 3 tabs by mouth for 4 days, then 2 tabs by mouth for 4 days, then 1 tab by mouth for 4 days 10)  Hydrocodone-acetaminophen 5-500 Mg Tabs (Hydrocodone-acetaminophen) .Marland Kitchen.. 1 by mouth q 6 hours as needed pain 11)  Tizanidine Hcl 4 Mg Tabs (Tizanidine hcl) .Marland Kitchen.. 1 by mouth at bedtime as needed muscle spasm   Orders Added: 1)  Est. Patient Level III [[id number]]

## 2010-04-08 NOTE — Progress Notes (Signed)
Summary: phone/gg  Phone Note Call from Patient   Caller: Patient Summary of Call: Pt called with c/o bumps on rt jaw area, also starting on left side of face.  Area is itching.  Onset 3 days ago.  A co-worker was Dx with chicken pox. No other c/o She wants referral to dermatologist,  Dr Terri Piedra.    Made appointment for tomorrow to evaluate. Initial call taken by: Merrie Roof RN,  February 05, 2010 5:06 PM  Follow-up for Phone Call        Agree with plan Follow-up by: Blanch Media MD,  February 06, 2010 9:40 AM

## 2010-04-08 NOTE — Progress Notes (Signed)
Summary: phone/gg  Phone Note Call from Patient   Summary of Call: Pt called and states rash on face is getting worse.  She was seen on  12/1 . Rash Still in same area but bumps keep coming up. She is still using the cream  HYDROCORTISONE 2.5 % LOTN two times a day .   Please advise. Pt # [id number] Initial call taken by: Merrie Roof RN,  February 14, 2010 2:07 PM  Follow-up for Phone Call        Provider Notified. Pelase, have the patient make an appointment on Monday or today with the first available provider. thank you. Follow-up by: Deatra Robinson MD,  February 14, 2010 2:13 PM  Additional Follow-up for Phone Call Additional follow up Details #1::        Pt called and message left because no answer. We can see at 1600 today or Monday. Additional Follow-up by: Merrie Roof RN,  February 14, 2010 2:53 PM    Additional Follow-up for Phone Call Additional follow up Details #2::    pt scheduled for monday with Dr Denton Meek Follow-up by: Merrie Roof RN,  February 14, 2010 3:16 PM

## 2010-04-08 NOTE — Discharge Summary (Signed)
Summary: Hospital Discharge Update    Hospital Discharge Update:  Date of Admission: 01/02/2010 Date of Discharge: 01/03/2010  Brief Summary:  Patient is a 35 year old woman with pmh significant for sciatica, lower back pain, and asthma who presented to the clinic complaining of worsening left leg pain and paresthesia, also with ataxia, weakness and urine incontinence. Due to high concerns for Cauda quina syndrome an MRI was done for further evaluation and was negative for that, showed just mild stenosis on L5-S1 and also some facet arthropathy. Patient also had an UA done as part of workup for her symptoms, which demonstrated 21-50 WBC's, many bacteria and 11-20 RBC's. Patient received tx for her UTI and is going to follow in the clinic on Nov 15 at 8:30am by Dr. Dorthula Rue. At discharge, patient was improved and in stable condition.  Lab or other results pending at discharge:  Urine cx  Other follow-up issues:  Please follow on her symptoms.  Medication list changes:  Added new medication of CIPRO 250 MG TABS (CIPROFLOXACIN HCL) Take 1 tablet by mouth two times a day for 2 more days. - Signed Rx of CIPRO 250 MG TABS (CIPROFLOXACIN HCL) Take 1 tablet by mouth two times a day for 2 more days.;  #4 x 0;  Signed;  Entered by: Vassie Loll MD;  Authorized by: Melida Quitter MD;  Method used: Electronically to [facility] [phone]*, [address] [place], [place]  [address], Ph: [phone], Fax: [phone]  The medication, problem, and allergy lists have been updated.  Please see the dictated discharge summary for details.  Discharge medications:  ALBUTEROL 90 MCG/ACT AERS (ALBUTEROL) Inhale 2 puffs four times a day as needed. SEASONALE 0.15-0.03 MG TABS (LEVONORGEST-ETH ESTRAD 91-DAY) Take 1 tablet by mouth once a day VALTREX 500 MG  TABS (VALACYCLOVIR HCL) As needed NORVASC 2.5 MG TABS (AMLODIPINE BESYLATE) Take 1 tablet by mouth once a day CANASA 1000 MG SUPP (MESALAMINE) 1 Supp at  bedtime once a week NEXIUM 40 MG CPDR (ESOMEPRAZOLE MAGNESIUM) Take 1 capsule by mouth once a day AMITRIPTYLINE HCL 25 MG TABS (AMITRIPTYLINE HCL) 1 by mouth at bedtime TRAMADOL HCL 50 MG TABS (TRAMADOL HCL) 1 by mouth qid as needed pain PREDNISONE 10 MG  TABS (PREDNISONE) 4 tabs by mouth for 6 days, then 3 tabs by mouth for 4 days, then 2 tabs by mouth for 4 days, then 1 tab by mouth for 4 days HYDROCODONE-ACETAMINOPHEN 5-500 MG TABS (HYDROCODONE-ACETAMINOPHEN) 1 by mouth q 6 hours as needed pain TIZANIDINE HCL 4 MG TABS (TIZANIDINE HCL) 1 by mouth at bedtime as needed muscle spasm CIPRO 250 MG TABS (CIPROFLOXACIN HCL) Take 1 tablet by mouth two times a day for 2 more days.  Other patient instructions:  -Take your medications as prescribed. -Remember to follow with your hospital followup appointment on Nov 15 at 8:30am at the outpatient clinic with Dr. Dorthula Rue. -Continue taking tramadol, vicodin and prednisone as indicated for resolution of your back and leg pain. -Keep yourself active but avoid painful activities. -A prescription for Ciprofloxacin, which is the antibiotic for your urinary tract infection was sent electronically to [facility] on [address]. Remember to take it for 2 more days as directed to complete treatment.  Note: Hospital Discharge Medications & Other Instructions handout was printed, one copy for patient and a second copy to be placed in hospital chart.

## 2010-04-08 NOTE — Assessment & Plan Note (Signed)
Summary: rash on face/gg   Vital Signs:  Patient profile:   35 year old female Height:      65 inches Weight:      154.6 pounds BMI:     25.82 Temp:     98.0 degrees F oral Pulse rate:   77 / minute BP sitting:   133 / 76  (right arm)  Vitals Entered By: Filomena Jungling NT II (February 06, 2010 2:43 PM) CC: RASH ON FACE  Is Patient Diabetic? No Pain Assessment Patient in pain? no      Nutritional Status BMI of 25 - 29 = overweight  Have you ever been in a relationship where you felt threatened, hurt or afraid?No   Does patient need assistance? Functional Status Self care Ambulation Normal   CC:  RASH ON FACE .  History of Present Illness: Reports facial rash for 1 days with pruritus; no fever, chills, redness, HA or abdomnial pain/diarrhea. 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 Denies any recent travels or outdoor trips; use of new oral or topical products.  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) 6)  Rectal Bleeding  (ICD-569.3) 7)  Hypertension  (ICD-401.9) 8)  Tachycardia  (ICD-785.0) 9)  Palpitations  (ICD-785.1) 10)  Dizziness  (ICD-780.4) 11)  Abnormal Findings, Elevated Bp w/o Htn  (ICD-796.2) 12)  Pap Smear, Lgsil, Abnormal  (ICD-795.09) 13)  Cervical Lymphadenopathy  (ICD-785.6) 14)  Abortion, Spnt w/o Complication, Complete  (ICD-634.92) 15)  Delivery, Normal Vaginal  (ICD-650) 16)  Wound Open, Buttock w/o Complication  (ICD-877.0) 17)  Chest Pain, Atypical  (ICD-786.59) 18)  Sciatica  (ICD-724.3) 19)  Asthma  (ICD-493.90)  Medications Prior to Update: 1)  Albuterol 90  Mcg/act Aers (Albuterol) .... Inhale 2 Puffs Four Times A Day As Needed. 2)  Seasonale 0.15-0.03 Mg Tabs (Levonorgest-Eth Estrad 91-Day) .... Take 1 Tablet By Mouth Once A Day 3)  Valtrex 500 Mg  Tabs (Valacyclovir Hcl) .... As Needed 4)  Norvasc 2.5 Mg Tabs (Amlodipine Besylate) .... Take 1 Tablet By Mouth Once A Day 5)  Canasa 1000 Mg Supp (Mesalamine) .Marland Kitchen.. 1 Supp At Bedtime Once A Week 6)  Nexium 40 Mg Cpdr (Esomeprazole Magnesium) .... Take 1 Capsule By Mouth Once A Day 7)  Amitriptyline Hcl 25 Mg Tabs (Amitriptyline Hcl) .Marland Kitchen.. 1 By Mouth At Bedtime 8)  Tramadol Hcl 50 Mg Tabs (Tramadol Hcl) .Marland Kitchen.. 1 By Mouth Qid As Needed Pain 9)  Prednisone 10 Mg  Tabs (Prednisone) .... 4 Tabs By Mouth For 6 Days, Then 3 Tabs By Mouth For 4 Days, Then 2 Tabs By Mouth For 4 Days, Then 1 Tab By Mouth For 4 Days 10)  Hydrocodone-Acetaminophen 5-500 Mg Tabs (Hydrocodone-Acetaminophen) .Marland Kitchen.. 1 By Mouth Q 6 Hours As Needed Pain 11)  Tizanidine Hcl 4 Mg Tabs (Tizanidine Hcl) .Marland Kitchen.. 1 By Mouth At Bedtime As Needed Muscle Spasm  Allergies (verified): 1)  ! Septra 2)  ! Amoxicillin  Past History:  Past medical, surgical, family and social histories (including risk factors) reviewed, and no changes noted (except as noted below). Past medical, surgical,  family and social histories (including risk factors) reviewed for relevance to current acute and chronic problems.  Past Medical History: Reviewed history from 01/30/2008 and no changes required. Asthma, mild by PFT's  01/15/1999 Sciatica 01/12/06 Complete spontaneous abortion x 2 Normal vaginal delievery x 3 Cervical lymphadenopathy/ biopsy neg  [name] Buttock wound, left buttock  History of abnormal papsmear s/p cryo (Dr.Newell) Atypical chest pain  Past Surgical History: Reviewed history from 01/30/2008 and no changes required. Bx of lymph node in R neck approx 2007   Family History: Reviewed history from 11/04/2007 and no changes required. Mother -  Ulcerative colitis  Social History: Reviewed history from 01/30/2008 and no changes required. does not smoke occasional beer on weekends  Physical Exam  General:  alert, well-developed, and well-nourished.   Head:  normocephalic and atraumatic.   Eyes:  vision grossly intact, pupils equal, pupils round, and pupils reactive to light.   Nose:  no external deformity, no external erythema, and no nasal discharge.   Mouth:  pharynx pink and moist.   Neck:  supple, full ROM, and no masses.   Lungs:  normal respiratory effort, no accessory muscle use, normal breath sounds, and no wheezes.   Heart:  normal rate, regular rhythm, no murmur, no gallop, no rub, and no JVD.   Abdomen:  soft, non-tender, normal bowel sounds, no distention, no masses, no guarding, and no rigidity.   Msk:  normal ROM, no joint swelling, no joint warmth, and no redness over joints.   Extremities:  No clubbing, cyanosis, edema, or deformity noted with normal full range of motion of all joints.   Neurologic:  alert & oriented X3 and strength normal in all extremities.   Skin:  diffuse maculo-papular erythematous rash to both cheeks. Cervical Nodes:  no anterior cervical adenopathy and no posterior cervical adenopathy.   Axillary Nodes:  no R axillary adenopathy.   Inguinal Nodes:  no R inguinal adenopathy and no L inguinal adenopathy.   Psych:  Oriented X3, normally interactive, good eye contact, and not anxious appearing.     Impression & Recommendations:  Problem # 1:  CONTACT DERMATITIS (ICD-692.9) Assessment New  The following medications were removed from the medication list:    Prednisone 10 Mg Tabs (Prednisone) .Marland KitchenMarland KitchenMarland KitchenMarland Kitchen 4 tabs by mouth for 6 days, then 3 tabs by mouth for 4 days, then 2 tabs by mouth for 4 days, then 1 tab by mouth for 4 days Her updated medication list for this problem includes:    Zyrtec Hives Relief 10 Mg Tabs (Cetirizine hcl) .Marland Kitchen... Take one tablet by mouth daiy prn    Hydrocortisone 2.5 %  Lotn (Hydrocortisone) .Marland Kitchen... Apply thin layer to rash two times a day for 3-5 days  Discussed avoidance of triggers and symptomatic treatment.   Complete Medication List: 1)  Albuterol 90 Mcg/act Aers (Albuterol) .... Inhale 2 puffs four times a day as needed. 2)  Seasonale 0.15-0.03 Mg Tabs (Levonorgest-eth estrad 91-day) .... Take 1 tablet by mouth once a day 3)  Valtrex 500 Mg Tabs (Valacyclovir hcl) .... As needed 4)  Norvasc 2.5 Mg Tabs (Amlodipine besylate) .... Take 1 tablet by mouth once a day 5)  Canasa 1000 Mg Supp (Mesalamine) .Marland Kitchen.. 1 supp at bedtime once a week 6)  Nexium 40 Mg Cpdr (Esomeprazole magnesium) .... Take 1 capsule by mouth once a day 7)  Tramadol Hcl 50 Mg Tabs (Tramadol hcl) .Marland Kitchen.. 1 by mouth qid as needed pain 8)  Hydrocodone-acetaminophen 5-500 Mg Tabs (  Hydrocodone-acetaminophen) .Marland Kitchen.. 1 by mouth q 6 hours as needed pain 9)  Zyrtec Hives Relief 10 Mg Tabs (Cetirizine hcl) .... Take one tablet by mouth daiy prn 10)  Hydrocortisone 2.5 % Lotn (Hydrocortisone) .... Apply thin layer to rash two times a day for 3-5 days Prescriptions: HYDROCORTISONE 2.5 % LOTN (HYDROCORTISONE) Apply thin layer to rash two times a day for 3-5 days  #60 gm x 0   Entered and Authorized by:   Deatra Robinson MD   Signed by:   Deatra Robinson MD on 02/06/2010   Method used:   Electronically to        [facility] [phone]* (retail)       [address]       [place], [place]  [address]       Ph: [phone]       Fax: [phone]   RxID:   [id number] ZYRTEC HIVES RELIEF 10 MG TABS (CETIRIZINE HCL) Take one tablet by mouth daiy PRN  #30 x 0   Entered and Authorized by:   Deatra Robinson MD   Signed by:   Deatra Robinson MD on 02/06/2010   Method used:   Electronically to        [facility] [phone]* (retail)       [address]       [place], [place]  [address]       Ph: [phone]       Fax: [phone]   RxID:   [id number]      Prevention & Chronic  Care Immunizations   Influenza vaccine: Not documented   Influenza vaccine deferral: Refused  (01/21/2010)    Tetanus booster: Not documented   Td booster deferral: Refused  (01/21/2010)    Pneumococcal vaccine: Not documented  Other Screening   Pap smear: Not documented   Smoking status: never  (02/06/2010)  Hypertension   Last Blood Pressure: 133 / 76  (02/06/2010)   Serum creatinine: 0.85  (04/11/2009)   Serum potassium 4.1  (04/11/2009)  Self-Management Support :   Personal Goals (by the next clinic visit) :      Personal blood pressure goal: 140/90  (01/21/2010)   Patient will work on the following items until the next clinic visit to reach self-care goals:     Medications and monitoring: take my medicines every day, bring all of my medications to every visit, weigh myself weekly  (02/06/2010)     Eating: drink diet soda or water instead of juice or soda, eat more vegetables, eat foods that are low in salt, eat baked foods instead of fried foods, eat fruit for snacks and desserts  (02/06/2010)     Activity: take a 30 minute walk every day  (02/06/2010)    Hypertension self-management support: Written self-care plan  (02/06/2010)   Hypertension self-care plan printed.     Appended Document: rash on face/gg   Influenza Vaccine    Vaccine Type: Fluvax Non-MCR    Site: left deltoid    Mfr: GlaxoSmithKline    Dose: 0.5 ml    Route: IM    Given by: Chinita Pester RN    Exp. Date: 09/06/2010    Lot #: [id number]    VIS given: 10/01/09 version given February 06, 2010.  Flu Vaccine Consent Questions    Do you have a history of severe allergic reactions to this vaccine? no    Any prior history of allergic reactions to egg and/or gelatin? no  Do you have a sensitivity to the preservative Thimersol? no    Do you have a past history of Guillan-Barre Syndrome? no    Do you currently have an acute febrile illness? no    Have you ever had a severe reaction to latex? no     Vaccine information given and explained to patient? yes    Are you currently pregnant? no

## 2010-04-08 NOTE — Assessment & Plan Note (Signed)
Summary: F/U VISIT/[facility]   History of Present Illness: Patient was not able to take neurontin as it gave her GI side effects she has known Ulcerative Colitis left side is somewhat better if she uses flexeril at night also helps to take voltaren no GI pain on voltaren to date  left side does feel weak to her hard to lift of load boxes or she will get sciatic sxs  Allergies: 1)  ! Septra 2)  ! Amoxicillin  Physical Exam  General:  Well-developed,well-nourished,in no acute distress; alert,appropriate and cooperative throughout examination Msk:  SLR on left brings some sciatic sxs but at > 45 deg weak on great toe fxn weak on toe walk on Lt good on eversion Tenderness over mid lower lumbar area  otherwise exam not changed Additional Exam:  Reviewed MRI significant bulging at L4/5 and L5/S1 when this was done in 2007 suspect that was while disk was in resolution phase   Impression & Recommendations:  Problem # 1:  SCIATICA (ICD-724.3)  Her updated medication list for this problem includes:    Flexeril 5 Mg Tabs (Cyclobenzaprine hcl) .Marland Kitchen... Take 1 tab by mouth at bedtime    Tramadol Hcl 50 Mg Tabs (Tramadol hcl) .Marland Kitchen... 1 by mouth qid as needed pain  I think this is residual of an old disk injury will try toi push amitriptyline to higher dose and see if this will lessen sciatic sxs use tramadol for back pain  Problem # 2:  BACK PAIN, LUMBAR (ICD-724.2)  Her updated medication list for this problem includes:    Flexeril 5 Mg Tabs (Cyclobenzaprine hcl) .Marland Kitchen... Take 1 tab by mouth at bedtime    Tramadol Hcl 50 Mg Tabs (Tramadol hcl) .Marland Kitchen... 1 by mouth qid as needed pain   pick at least a few of exercises that she can continue says that they cause her some pain but can do some flexion ones  reck in 2 mos  Complete Medication List: 1)  Albuterol 90 Mcg/act Aers (Albuterol) .... Inhale 2 puffs four times a day as needed. 2)  Seasonale 0.15-0.03 Mg Tabs (Levonorgest-eth estrad  91-day) .... Take 1 tablet by mouth once a day 3)  Valtrex 500 Mg Tabs (Valacyclovir hcl) .... As needed 4)  Norvasc 2.5 Mg Tabs (Amlodipine besylate) .... Take 1 tablet by mouth once a day 5)  Canasa 1000 Mg Supp (Mesalamine) .Marland Kitchen.. 1 supp at bedtime once a week 6)  Prevacid 30 Mg Cpdr (Lansoprazole) .... One by mouth daily 7)  Flexeril 5 Mg Tabs (Cyclobenzaprine hcl) .... Take 1 tab by mouth at bedtime 8)  Amitriptyline Hcl 25 Mg Tabs (Amitriptyline hcl) .... Use 1 by mouth at bedtime x 2 weeks then 2 by mouth qhs 9)  Tramadol Hcl 50 Mg Tabs (Tramadol hcl) .Marland Kitchen.. 1 by mouth qid as needed pain Prescriptions: TRAMADOL HCL 50 MG TABS (TRAMADOL HCL) 1 by mouth qid as needed pain  #100 x 1   Entered and Authorized by:   Enid Baas MD   Signed by:   Enid Baas MD on 05/21/2009   Method used:   Electronically to        [facility] [phone]* (retail)       [address]       [place], [place]  [address]       Ph: [phone]       Fax: [phone]   RxID:   [id number] AMITRIPTYLINE HCL 25 MG TABS (AMITRIPTYLINE HCL) use 1 by mouth  at bedtime x 2 weeks then 2 by mouth qhs  #60 x 2   Entered and Authorized by:   Enid Baas MD   Signed by:   Enid Baas MD on 05/21/2009   Method used:   Electronically to        [facility] [phone]* (retail)       [address]       [place], [place]  [address]       Ph: [phone]       Fax: [phone]   RxID:   [id number]

## 2010-04-16 NOTE — Progress Notes (Signed)
Summary: urg care/ [facility]  Phone Note Call from Patient   Summary of Call: pt calls and states she is ahving low abd pain, "sweats", chills and feels weak, she is referred to urgent care now Initial call taken by: Marin Roberts RN,  April 01, 2010 2:48 PM

## 2010-04-28 ENCOUNTER — Inpatient Hospital Stay (INDEPENDENT_AMBULATORY_CARE_PROVIDER_SITE_OTHER)
Admission: RE | Admit: 2010-04-28 | Discharge: 2010-04-28 | Disposition: A | Discharge status: Home / Self Care | Payer: Self-pay | Source: Ambulatory Visit | Attending: Emergency Medicine | Admitting: Emergency Medicine

## 2010-04-28 ENCOUNTER — Telehealth: Payer: Self-pay | Admitting: *Deleted

## 2010-04-28 DIAGNOSIS — R05 Cough: Secondary | ICD-10-CM

## 2010-04-28 DIAGNOSIS — R059 Cough, unspecified: Secondary | ICD-10-CM

## 2010-04-28 NOTE — Telephone Encounter (Signed)
Call from pt said that she is having problems with her Asthma.  Like green mucous being coughed up.  Unsure of a fever no thermometer.  Some shortness of breath with some wheezing.  Pt was advised to go to Urgent Care or the ER.  No available appointments in the Clinics.

## 2010-05-14 ENCOUNTER — Encounter: Payer: Self-pay | Admitting: Internal Medicine

## 2010-05-15 ENCOUNTER — Other Ambulatory Visit: Payer: Self-pay | Admitting: *Deleted

## 2010-05-15 MED ORDER — AMLODIPINE BESYLATE 2.5 MG PO TABS: 2.5000 mg | ORAL_TABLET | Freq: Every day | ORAL | Status: DC

## 2010-05-21 LAB — DIFFERENTIAL
Basophils Absolute: 0 10*3/uL (ref 0.0–0.1)
Basophils Relative: 0 % (ref 0–1)
Eosinophils Absolute: 0.1 10*3/uL (ref 0.0–0.7)
Eosinophils Relative: 2 % (ref 0–5)
Lymphocytes Relative: 12 % (ref 12–46)
Lymphs Abs: 0.8 10*3/uL (ref 0.7–4.0)
Monocytes Absolute: 0.4 10*3/uL (ref 0.1–1.0)
Monocytes Relative: 7 % (ref 3–12)
Neutro Abs: 5.1 10*3/uL (ref 1.7–7.7)
Neutrophils Relative %: 79 % — ABNORMAL HIGH (ref 43–77)

## 2010-05-21 LAB — URINE MICROSCOPIC-ADD ON

## 2010-05-21 LAB — URINALYSIS, ROUTINE W REFLEX MICROSCOPIC
Bilirubin Urine: NEGATIVE
Glucose, UA: NEGATIVE mg/dL
Glucose, UA: NEGATIVE mg/dL
Ketones, ur: >80 mg/dL — AB
Ketones, ur: 15 mg/dL — AB
Nitrite: NEGATIVE
Nitrite: NEGATIVE
Protein, ur: NEGATIVE mg/dL
Protein, ur: NEGATIVE mg/dL
Specific Gravity, Urine: 1.027 (ref 1.005–1.030)
Specific Gravity, Urine: 1.026 (ref 1.005–1.030)
Urobilinogen, UA: 1 mg/dL (ref 0.0–1.0)
Urobilinogen, UA: 1 mg/dL (ref 0.0–1.0)
pH: 6 (ref 5.0–8.0)
pH: 7 (ref 5.0–8.0)

## 2010-05-21 LAB — BASIC METABOLIC PANEL
BUN: 6 mg/dL (ref 6–23)
CO2: 23 mEq/L (ref 19–32)
Calcium: 8.3 mg/dL — ABNORMAL LOW (ref 8.4–10.5)
Chloride: 110 mEq/L (ref 96–112)
Creatinine, Ser: 0.78 mg/dL (ref 0.4–1.2)
GFR calc Af Amer: >60 mL/min (ref >60)
GFR calc non Af Amer: >60 mL/min (ref >60)
Glucose, Bld: 83 mg/dL (ref 70–99)
Potassium: 3.4 mEq/L — ABNORMAL LOW (ref 3.5–5.1)
Sodium: 138 mEq/L (ref 135–145)

## 2010-05-21 LAB — CBC
HCT: 35.5 % — ABNORMAL LOW (ref 36.0–46.0)
HCT: 39.5 % (ref 36.0–46.0)
Hemoglobin: 12 g/dL (ref 12.0–15.0)
Hemoglobin: 13.9 g/dL (ref 12.0–15.0)
MCH: 30.2 pg (ref 26.0–34.0)
MCH: 31.1 pg (ref 26.0–34.0)
MCHC: 33.8 g/dL (ref 30.0–36.0)
MCHC: 35.2 g/dL (ref 30.0–36.0)
MCV: 89.4 fL (ref 78.0–100.0)
MCV: 88.4 fL (ref 78.0–100.0)
Platelets: 262 10*3/uL (ref 150–400)
Platelets: 273 10*3/uL (ref 150–400)
RBC: 3.97 MIL/uL (ref 3.87–5.11)
RBC: 4.47 MIL/uL (ref 3.87–5.11)
RDW: 14 % (ref 11.5–15.5)
RDW: 13.7 % (ref 11.5–15.5)
WBC: 7.3 10*3/uL (ref 4.0–10.5)
WBC: 6.5 10*3/uL (ref 4.0–10.5)

## 2010-05-21 LAB — COMPREHENSIVE METABOLIC PANEL
ALT: 17 U/L (ref 0–35)
AST: 20 U/L (ref 0–37)
Albumin: 3.3 g/dL — ABNORMAL LOW (ref 3.5–5.2)
Alkaline Phosphatase: 77 U/L (ref 39–117)
BUN: 6 mg/dL (ref 6–23)
CO2: 25 mEq/L (ref 19–32)
Calcium: 8.7 mg/dL (ref 8.4–10.5)
Chloride: 105 mEq/L (ref 96–112)
Creatinine, Ser: 0.86 mg/dL (ref 0.4–1.2)
GFR calc Af Amer: >60 mL/min (ref >60)
GFR calc non Af Amer: >60 mL/min (ref >60)
Glucose, Bld: 86 mg/dL (ref 70–99)
Potassium: 3.7 mEq/L (ref 3.5–5.1)
Sodium: 136 mEq/L (ref 135–145)
Total Bilirubin: 0.9 mg/dL (ref 0.3–1.2)
Total Protein: 7.5 g/dL (ref 6.0–8.3)

## 2010-05-21 LAB — URINE CULTURE
Colony Count: 15000
Culture  Setup Time: 201110282053

## 2010-05-21 LAB — PREGNANCY, URINE: Preg Test, Ur: NEGATIVE

## 2010-05-21 LAB — POCT PREGNANCY, URINE: Preg Test, Ur: NEGATIVE

## 2010-05-21 LAB — LIPASE, BLOOD: Lipase: 21 U/L (ref 11–59)

## 2010-05-26 ENCOUNTER — Ambulatory Visit (INDEPENDENT_AMBULATORY_CARE_PROVIDER_SITE_OTHER): Payer: Medicaid Other | Admitting: Internal Medicine

## 2010-05-26 ENCOUNTER — Encounter: Payer: Self-pay | Admitting: Internal Medicine

## 2010-05-26 VITALS — BP 126/86 | HR 85 | Temp 98.8°F | Ht 63.0 in | Wt 156.9 lb

## 2010-05-26 DIAGNOSIS — R05 Cough: Secondary | ICD-10-CM

## 2010-05-26 DIAGNOSIS — R059 Cough, unspecified: Secondary | ICD-10-CM

## 2010-05-26 DIAGNOSIS — R053 Chronic cough: Secondary | ICD-10-CM

## 2010-05-26 LAB — CBC WITH DIFFERENTIAL/PLATELET
Basophils Absolute: 0 10*3/uL (ref 0.0–0.1)
Basophils Relative: 0 % (ref 0–1)
Eosinophils Absolute: 0.2 10*3/uL (ref 0.0–0.7)
Eosinophils Relative: 3 % (ref 0–5)
HCT: 39.5 % (ref 36.0–46.0)
Hemoglobin: 13.3 g/dL (ref 12.0–15.0)
Lymphocytes Relative: 42 % (ref 12–46)
Lymphs Abs: 2.5 10*3/uL (ref 0.7–4.0)
MCH: 29.6 pg (ref 26.0–34.0)
MCHC: 33.7 g/dL (ref 30.0–36.0)
MCV: 88 fL (ref 78.0–100.0)
Monocytes Absolute: 0.4 10*3/uL (ref 0.1–1.0)
Monocytes Relative: 6 % (ref 3–12)
Neutro Abs: 3 10*3/uL (ref 1.7–7.7)
Neutrophils Relative %: 49 % (ref 43–77)
Platelets: 314 10*3/uL (ref 150–400)
RBC: 4.49 MIL/uL (ref 3.87–5.11)
RDW: 14.2 % (ref 11.5–15.5)
WBC: 6.1 10*3/uL (ref 4.0–10.5)

## 2010-05-26 MED ORDER — FLUTICASONE PROPIONATE 50 MCG/ACT NA SUSP: 2.0000 | Freq: Every day | NASAL | Status: DC

## 2010-05-26 MED ORDER — FEXOFENADINE-PSEUDOEPHED ER 60-120 MG PO TB12: 1.0000 | ORAL_TABLET | Freq: Two times a day (BID) | ORAL | Status: DC

## 2010-05-26 MED ORDER — OMEPRAZOLE 20 MG PO CPDR: 20.0000 mg | DELAYED_RELEASE_CAPSULE | Freq: Every day | ORAL | Status: DC

## 2010-05-26 NOTE — Patient Instructions (Signed)
Make a follow up appointment in 1 month. Take all medication as directed. 

## 2010-05-26 NOTE — Progress Notes (Signed)
  Subjective:    Patient ID: Sue [name], female    DOB: [birth year], 35 y.o.   MRN: [id number]  HPI  35 yr old woman with  Past Medical History  Diagnosis Date  . Asthma     mild by PFT's 03/16/1998  . Sciatica 01/12/06  . Atypical chest pain   . History of abnormal Pap smear     s/p cryo( Dr. Wiliam [name])  . Cervical lymphadenopathy     biopsy negativeMarvetta [name])  . Open wound of left buttock   . Normal vaginal delivery     x3  . Complete spontaneous abortion     x2    comes to the clinic complaining of cough for 2 months. Patient had greenish yellow productive cough when it started. Last week color of sputum changed whitish color.  Over this time has not gotten any worse or better. Has used albuterol inhaler which has not helped, OTC cough supressant has not helped. Associated runny nose. Aggravated with smoke, dust. Denies fevers. Patient went to the [facility] and was given prednisone taper for a week. Reports to have helped some but did not completely resolve cough. Patient has GERD which takes prevacid. Patient only takes when she needs it.   Review of Systems  HENT: Positive for rhinorrhea and sneezing. Negative for hearing loss, congestion and tinnitus.   Respiratory: Negative for wheezing.   [all other systems reviewed and are negative       Objective:   Physical Exam  [vitalsreviewed. Constitutional: She is oriented to person, place, and time. She appears well-developed and well-nourished. No distress.  HENT:  Mouth/Throat: Posterior oropharyngeal erythema present. No oropharyngeal exudate or posterior oropharyngeal edema.       Post nasal drip  Neck: Normal range of motion. Neck supple.  Cardiovascular: Normal rate, regular rhythm and normal heart sounds.   Pulmonary/Chest: Effort normal and breath sounds normal.  Abdominal: Soft. Bowel sounds are normal.  Musculoskeletal: Normal range of motion.  Lymphadenopathy:    She has no cervical adenopathy.  Neurological: She is  alert and oriented to person, place, and time.  Skin: No rash noted. She is not diaphoretic. No erythema.  Psychiatric: She has a normal mood and affect.          Assessment & Plan:

## 2010-05-26 NOTE — Assessment & Plan Note (Signed)
Most likely contributing cause at this time is post nasal drip, but GERD and asthma could be contributing. Patient will be started on antihistamine/decongestant, nasal corticosteroid, instruct her to take ppi daily, and use albuterol as previously directed. Will follow up in one month. No fever/chills, headaches, purulent discharge to suggest indication for antibiotic usage.

## 2010-06-09 ENCOUNTER — Encounter: Payer: Self-pay | Admitting: Family Medicine

## 2010-06-09 ENCOUNTER — Ambulatory Visit (INDEPENDENT_AMBULATORY_CARE_PROVIDER_SITE_OTHER): Payer: Medicaid Other | Admitting: Family Medicine

## 2010-06-09 DIAGNOSIS — M545 Low back pain, unspecified: Secondary | ICD-10-CM

## 2010-06-09 DIAGNOSIS — IMO0002 Reserved for concepts with insufficient information to code with codable children: Secondary | ICD-10-CM

## 2010-06-09 MED ORDER — PREDNISONE 10 MG PO TABS: 50.0000 mg | ORAL_TABLET | Freq: Every day | ORAL | Status: AC

## 2010-06-09 NOTE — Progress Notes (Signed)
  Subjective:    Patient ID: Sue [name], female    DOB: 1976/01/16, 35 y.o.   MRN: [id number]  HPI Complaint of recurrence of her low back pain with pain shooting down her right side. She has had this off and on for 5 years since the birth of her child. The most severe episode was back in October when she had to be hospitalized. She was treated at that time with a steroid burst she said that made her 90-95% better. She did well until this last 2-3 days when the pain has come back once again. The pain originates in her lower back extends down through the buttock and into her right calf. It is a sharp shooting burning pain that is 7-8/10 currently. He is worse if she sits. Standing gives her some relief. She has had no giving way of the leg no numbness. In the past she has been fairly swollen. Course of physical therapy without any benefit. She is also been on TENS unit was seen to make things worse. She has previously been on gabapentin which made her very sleepy so she was not able to take it.   She denies any specific new injury.  Review of Systems    no fever no weight loss no urinary incontinence. No bowel incontinence or constipation. Objective:   Physical Exam    well-developed female no acute distress. She appears to be in some discomfort sitting. Straight leg raise on the right is positive at a 90 level. Straight leg raise on the left is negative. Her lower extremity strength is 5 out of 5 bilaterally DTRs are 2+ at the knee and ankle sensation is intact distal    Assessment & Plan:  Assessment sciatic pain  Plan atypical presentation. I am not really sure were dealing with classic sciatica. I reviewed her MRI. She does have some foraminal narrowing at L5-S1. We discussed options. I will treat her one more time with a steroid burst. If she has recurrence of episodes after this I would not plan to retreat with steroid. We discussed TENS unit visible therapy et Karie Soda. She might be a  candidate for epidural steroid injections. She will follow up either with Dr. Dallas Schimke who she sees regularly or as many when necessary

## 2010-06-16 ENCOUNTER — Other Ambulatory Visit: Payer: Self-pay | Admitting: *Deleted

## 2010-06-17 MED ORDER — ALBUTEROL 90 MCG/ACT IN AERS: 2.0000 | INHALATION_SPRAY | Freq: Four times a day (QID) | RESPIRATORY_TRACT | Status: DC | PRN

## 2010-06-23 ENCOUNTER — Ambulatory Visit (INDEPENDENT_AMBULATORY_CARE_PROVIDER_SITE_OTHER): Payer: Medicaid Other | Admitting: Family Medicine

## 2010-06-23 ENCOUNTER — Encounter: Payer: Self-pay | Admitting: Family Medicine

## 2010-06-23 VITALS — BP 115/72

## 2010-06-23 DIAGNOSIS — M545 Low back pain, unspecified: Secondary | ICD-10-CM

## 2010-06-23 NOTE — Progress Notes (Signed)
  Subjective:    Patient ID: Sue [name], female    DOB: June 29, 1975, 35 y.o.   MRN: [id number]  HPI  Followup back pain. She is no better. Pain still starts in the right low back radiates into the buttock and down to the right leg almost to the foot. She's had no weakness of the leg. She's had no falls no giving way. She's had no incontinence.  Review of Systems See history of present illness for review of systems is pertinent.    Objective:   Physical Exam    GENERAL: Well-developed female no acute distress. She gets on and off the exam table with these GAIT: Normal gait. BACK: Full flexion and extension without pain at the hips. Negative straight leg raise bilaterally. Tender to palpation in the right side not. Figure-of-four testing is positive on the right negative on the left.  INJECTION: Patient was given informed consent, signed copy in the chart. Appropriate time out was taken. Area prepped and draped in usual sterile fashion. 1 cc of kenalog plus  3 cc of lidocaine was injected into the SI joint on the right using a(n) posterior approach. The patient tolerated the procedure well. There were no complications. Post procedure instructions were given.  Assessment & Plan:  #1 right radiculopathy with low back pain. This was similar to her previous episode where she had required hospitalization and ultimately improved with a course of steroids. We tried that at last visit. She said it made absolutely no difference. 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 I reviewed her CT of her abdomen which was done for another reason and there is some arthritic change at the [facility] joint particularly on the right. After discussion today we decided to do the injection and see how that does for her. I'll see her back in 2 weeks. If that is not improving at that time. We probably need to consider gabapentin.

## 2010-06-23 NOTE — Assessment & Plan Note (Signed)
See A/P below

## 2010-06-24 ENCOUNTER — Ambulatory Visit: Payer: Medicaid Other | Admitting: Internal Medicine

## 2010-07-07 ENCOUNTER — Telehealth: Payer: Self-pay | Admitting: *Deleted

## 2010-07-07 NOTE — Telephone Encounter (Signed)
Call from pt continues to have a cough.  Mucous is brown or white.  Coughing a lot.  No fevers.  Is having chills.  Soreness in back and chest.  Pt given an appointment for tomorrow at 12:15 PM.

## 2010-07-08 ENCOUNTER — Encounter: Payer: Self-pay | Admitting: Internal Medicine

## 2010-07-08 ENCOUNTER — Ambulatory Visit (INDEPENDENT_AMBULATORY_CARE_PROVIDER_SITE_OTHER): Payer: Medicaid Other | Admitting: Internal Medicine

## 2010-07-08 ENCOUNTER — Ambulatory Visit (HOSPITAL_COMMUNITY)
Admission: RE | Admit: 2010-07-08 | Discharge: 2010-07-08 | Disposition: A | Discharge status: Home / Self Care | Payer: Medicaid Other | Source: Ambulatory Visit | Attending: Internal Medicine | Admitting: Internal Medicine

## 2010-07-08 ENCOUNTER — Ambulatory Visit: Payer: Medicaid Other | Admitting: Family Medicine

## 2010-07-08 DIAGNOSIS — R059 Cough, unspecified: Secondary | ICD-10-CM | POA: Insufficient documentation

## 2010-07-08 DIAGNOSIS — R05 Cough: Secondary | ICD-10-CM | POA: Insufficient documentation

## 2010-07-08 DIAGNOSIS — J45909 Unspecified asthma, uncomplicated: Secondary | ICD-10-CM

## 2010-07-08 DIAGNOSIS — I10 Essential (primary) hypertension: Secondary | ICD-10-CM | POA: Insufficient documentation

## 2010-07-08 DIAGNOSIS — R053 Chronic cough: Secondary | ICD-10-CM

## 2010-07-08 DIAGNOSIS — K5120 Ulcerative (chronic) proctitis without complications: Secondary | ICD-10-CM

## 2010-07-08 MED ORDER — CETIRIZINE HCL 10 MG PO TABS: 10.0000 mg | ORAL_TABLET | Freq: Every day | ORAL | Status: DC

## 2010-07-08 MED ORDER — OMEPRAZOLE 20 MG PO CPDR: 20.0000 mg | DELAYED_RELEASE_CAPSULE | Freq: Every day | ORAL | Status: DC

## 2010-07-08 MED ORDER — FLUTICASONE-SALMETEROL 250-50 MCG/DOSE IN AEPB: 1.0000 | INHALATION_SPRAY | Freq: Two times a day (BID) | RESPIRATORY_TRACT | Status: DC

## 2010-07-08 MED ORDER — CODEINE SULFATE 15 MG PO TABS: 15.0000 mg | ORAL_TABLET | Freq: Four times a day (QID) | ORAL | Status: AC | PRN

## 2010-07-08 NOTE — Progress Notes (Signed)
  Subjective:    Patient ID: Sue [name], female    DOB: 1976/01/21, 35 y.o.   MRN: [id number]  HPI Sue [name] is a 35 year old woman with a history of asthma, low back pain the clinic with complaint of chronic cough for 2 and half months now. She was seen in the clinic in March 2012 with similar complaint. At that time she had postnasal drip (which is improved at this time), cough productive of greenish sputum, without fever or chills. She was sent home with allergy medications which helped her a bit but she still continued to have symptoms. Her cough is the same but sputum is more productive than before and is whitish to brownish. Her cough occurs at a time but is more pronounced and she is doing some activity or sitting rather than sleeping. She still denies having fever but complains of some chills intermittently. She also complains of nighttime awakenings due to cough and shortness of breath, which happened twice during last week. She is using more albuterol than before. She uses twice a day for 2 half months and was using about once a month before that.  She also complains of acid reflux, but says it doesn't bother her much in terms of heartburn. She denies any chest pain, headache, facial pain, diarrhea, abdominal pain, urinary abnormalities. Review of Systems    as per history of present illness Objective:   Physical Exam   Constitutional: Vital signs reviewed.  Patient is a well-developed and well-nourished in no acute distress and cooperative with exam.  coughing intermittently. Head: Normocephalic and atraumatic Ear: TM normal bilaterally Mouth: no significant erythema or exudates, MMM Eyes: PERRL, EOMI, conjunctivae normal, No scleral icterus.  Neck: Supple, Trachea midline normal ROM, No JVD, mass, thyromegaly, or carotid bruit present.  Cardiovascular: RRR, S1 normal, S2 normal, no MRG, pulses symmetric and intact bilaterally Pulmonary/Chest: CTAB, no wheezes, rales, or  rhonchi Abdominal: Soft. Non-tender, non-distended, bowel sounds are normal, no masses, organomegaly, or guarding present.  GU: no CVA tenderness Musculoskeletal: No joint deformities, erythema, or stiffness, ROM full and no nontender Neurological: A&O x3, Strenght is normal and symmetric bilaterally, cranial nerve II-XII are grossly intact, no focal motor deficit, sensory intact to light touch bilaterally.  Skin: Warm, dry and intact. No rash, cyanosis, or clubbing.  Psychiatric: Normal mood and affect. speech and behavior is normal. Judgment and thought content normal. Cognition and memory are normal.        Assessment & Plan:

## 2010-07-08 NOTE — Patient Instructions (Signed)
Please make a f/u appointment in about 2-3 weeks. Please get the chest x-ray done and I will call you if there is some abnormality on x-ray. Please take his medications as prescribed for her cough. Please try to avoid exposure to smoke from cigarettes and other allergens which makes you cough.

## 2010-07-08 NOTE — Assessment & Plan Note (Addendum)
She did not respond  medications and expected. She still continues to have cough and pelvic exam it doesn't seem to be an infectious etiology at this point. She's not wheezing and doesn't have any crackles on exam and some not suspecting an asthma exacerbation at this point. Considering her chronic cough-likely multifactorial including asthma, GERD, allergy, we'll maximize therapy for all 3. We'll also give her codeine for cough suppression at night. We'll do a chest x-ray and followup with that.

## 2010-07-08 NOTE — Assessment & Plan Note (Signed)
Stable at this point time, no more flares since many years.

## 2010-07-08 NOTE — Assessment & Plan Note (Addendum)
She is increased need for albuterol about twice daily compared to once a month before starting this chronic cough. She also complained of nighttime awakenings and cough with shortness of breath and so we'll add inhaled steroids. We will optimize therapy for asthma along with a running therapy for GERD and allergy. We'll followup in a couple of weeks with a chest x-ray result.

## 2010-07-23 ENCOUNTER — Encounter: Payer: Self-pay | Admitting: Internal Medicine

## 2010-07-23 ENCOUNTER — Ambulatory Visit (INDEPENDENT_AMBULATORY_CARE_PROVIDER_SITE_OTHER): Payer: Medicaid Other | Admitting: Internal Medicine

## 2010-07-23 ENCOUNTER — Encounter: Payer: Medicaid Other | Admitting: Internal Medicine

## 2010-07-23 DIAGNOSIS — R053 Chronic cough: Secondary | ICD-10-CM

## 2010-07-23 DIAGNOSIS — R05 Cough: Secondary | ICD-10-CM

## 2010-07-23 DIAGNOSIS — R059 Cough, unspecified: Secondary | ICD-10-CM

## 2010-07-23 DIAGNOSIS — J40 Bronchitis, not specified as acute or chronic: Secondary | ICD-10-CM

## 2010-07-23 DIAGNOSIS — J45909 Unspecified asthma, uncomplicated: Secondary | ICD-10-CM

## 2010-07-23 MED ORDER — HYDROCODONE-HOMATROPINE 5-1.5 MG/5ML PO SYRP: 2.5000 mL | ORAL_SOLUTION | Freq: Four times a day (QID) | ORAL | Status: AC | PRN

## 2010-07-23 MED ORDER — FLUTICASONE PROPIONATE HFA 110 MCG/ACT IN AERO: 1.0000 | INHALATION_SPRAY | Freq: Two times a day (BID) | RESPIRATORY_TRACT | Status: DC

## 2010-07-23 MED ORDER — AZITHROMYCIN 500 MG PO TABS: 500.0000 mg | ORAL_TABLET | Freq: Every day | ORAL | Status: AC

## 2010-07-23 NOTE — Assessment & Plan Note (Signed)
Considering her symptoms and chronic cough for about 2 and a half months and increased need of albuterol-2-3 times per day , She likely has bronchitis along with worsening of her asthma and so we'll start her on Flovent 1 puff twice a day to help relieve the inflammation in the airways. She doesn't seem to need Advair at this point of time and so we'll just continue the albuterol as needed and Flovent.

## 2010-07-23 NOTE — Progress Notes (Signed)
  Subjective:    Patient ID: Sue [name], female    DOB: 05/11/1975, 35 y.o.   MRN: [id number]  HPI Ms. Satterly is a 35 year old woman with past medical history of hypertension, asthma who comes for followup visit for chronic cough. She was seen on 07/08/2010 4 her cough which is going on for about 2 and half months now and was sent home on Zyrtec and her Flonase which she takes.  Chest x-ray done during last visit did show any pulmonary infiltrates or active disease.  She didn't get any better and her cough is still the same and little bit worse. She still coughing up sputum which is now greenish. She is being waking up from sleep due to cough and wheezing more frequently about every night for last week. She denies any fever, chills, chest pain, shortness of breath, headache abdominal pain, diarrhea, urinary abnormalities.    Review of Systems    as per history of present illness Objective:   Physical Exam    Constitutional: Vital signs reviewed.  Patient is a well-developed and well-nourished, coughing up multiple times during exam. Alert and oriented x3.  Head: Normocephalic and atraumatic Mouth: no erythema or exudates, MMM Eyes: PERRL, EOMI, conjunctivae normal, No scleral icterus.  Neck: Supple, Trachea midline normal ROM, No JVD, mass, thyromegaly, or carotid bruit present.  Cardiovascular: RRR, S1 normal, S2 normal, no MRG, pulses symmetric and intact bilaterally Pulmonary/Chest: CTAB, no wheezes, rales, or rhonchi Abdominal: Soft. Non-tender, non-distended, bowel sounds are normal, no masses, organomegaly, or guarding present.  Musculoskeletal: No joint deformities, erythema, or stiffness, ROM full and no nontender Neurological: A&O x3, Strenght is normal and symmetric bilaterally, cranial nerve II-XII are grossly intact, no focal motor deficit, sensory intact to light touch bilaterally.  Skin: Warm, dry and intact. No rash, cyanosis, or clubbing.      Assessment & Plan:

## 2010-07-23 NOTE — Assessment & Plan Note (Signed)
Considering her chronic cough presentation and not improved with antihistamines alone, will add Flovent for her asthma treatment which might help her cough and also will give azithromycin 500 milligram by mouth daily for 5 days. Also will give Hycodan cough syrup. Instructed her to call the clinic if cough doesn't improve  after to it for now.

## 2010-07-23 NOTE — Patient Instructions (Addendum)
Please make a followup appointment in 3-4 months with primary care. Please start using Flovent-one puff twice daily along with Hycodan cough syrup. Also take azithromycin 500 mg once daily for next 5 days. Also continue taking Claritin and using Flonase. We will hold on for Advair Diskus for now, and we'll consider starting it is needed in future. If you still have a cough with the above measures after 2 weeks from now called and he can make an appointment.

## 2010-07-25 NOTE — Op Note (Signed)
[name], Sue [name]                  ACCOUNT NO.:  [id number]   MEDICAL RECORD NO.:  [id number]          PATIENT TYPE:  AMB   LOCATION:  [facility]                          FACILITY:  [facility]   PHYSICIAN:  Christopher E. Ezzard Standing, M.D.DATE OF BIRTH:  [birth year]   DATE OF PROCEDURE:  08/15/2004  DATE OF DISCHARGE:                                 OPERATIVE REPORT   PREOPERATIVE DIAGNOSIS:  Enlarged right jugular neck node.   POSTOPERATIVE DIAGNOSIS:  Enlarged right jugular neck node.   OPERATION:  Excisional biopsy, deep right jugular neck node.   SURGEON:  Kristine Garbe. Ezzard Standing, M.D.   ANESTHESIA:  General endotracheal.   COMPLICATIONS:  None.   BRIEF CLINICAL NOTE:  Sue [name] is a 35 year old female who has had a  persistent enlarged right neck node measuring approximately 1.5-2 cm size in  the region of the mid-jugular chain of lymph nodes.  Because this has been  persistent since December, she is taken to the operating room at this time  for excisional biopsy.   DESCRIPTION OF PROCEDURE:  After adequate endotracheal anesthesia, the  patient received 1 g of Ancef IV preoperatively.  The right neck was prepped  with Betadine solution.  The incision site was marked out and injected with  Xylocaine with epinephrine.  Incision was made directly over the neck node.  Dissection was carried down through the platysma muscle, subcutaneous  tissue.  The neck node was just anterior to the sternocleidomastoid muscle  along the jugular chain of lymph nodes.  There were actually two small  nodes, one measuring approximately 1.5-2 cm in size, another approximately 1  cm size.  The nodes were removed, hemostasis was obtained with the cautery.  The nodes were sent in saline fresh to pathology.  The defect was closed  with 3-0 chromic sutures subcutaneously and a 5-0 nylon suture to  reapproximate the skin edges, followed by Steri-Strips.  That completed the  procedure.  Dressing was applied.  Sue [name] was  awoken from anesthesia and  transferred to the recovery room postop doing well.   DISPOSITION:  Tsering Leaman is discharged home later this morning on Tylenol  and Tylenol No. 3 p.r.n. pain.  We will have her follow up in my office in  six days for suture removal and to review pathology.        CEN/MEDQ  D:  08/15/2004  T:  08/15/2004  Job:  [id number]   cc:   Clare Charon, M.D.  [address]  [place] [address]  Fax: [phone]

## 2010-07-25 NOTE — H&P (Signed)
NAME:  Sue [name], Sue [name]               ACCOUNT NO.:  [id number]   MEDICAL RECORD NO.:  [id number]          PATIENT TYPE:  MAT   LOCATION:  [facility]                          FACILITY:  [facility]   PHYSICIAN:  Roseanna Rainbow, M.D.DATE OF BIRTH:  16-Sep-1975   DATE OF ADMISSION:  07/06/2005  DATE OF DISCHARGE:                                HISTORY & PHYSICAL   CHIEF COMPLAINT:  The patient is a gravida 5, para 2, with an estimated date  of confinement of May 11 with an intrauterine pregnancy at 38+ weeks  complaining of contractions.   HISTORY OF PRESENT ILLNESS:  Please see the above.  She denies rupture of  membranes.   ALLERGIES:  AMOXICILLIN.   MEDICATIONS:  Prenatal vitamins.   CURRENT RISK FACTORS:  None.   LABORATORY DATA:  Quad screen was elevated maternal serum alpha-fetoprotein  2.79. Blood type O positive, antibody screen negative.  Hemoglobin 10.4,  hematocrit 32.1, platelets 295,000.  GC and Chlamydia negative.  One hour  GGT 115. GBS negative on April 4.  Hepatitis B surface antigen negative.  HIV nonreactive.  RPR nonreactive.  Rubella nonimmune.  Sickle cell  negative.   OB HISTORY:  She has a spontaneous abortion in January 1998.  She delivered  a female, full-term, spontaneous vaginal delivery in December 1993.  She was  as delivered of a female at 53 weeks.  That pregnancy was complicated by  preeclampsia and in January 1992, she had a second trimester spontaneous  abortion.   SOCIAL HISTORY:  She has no significant smoking history.  She has a history  of alcohol usage.  She denies illicit drug use.   PAST MEDICAL HISTORY:  Asthma.   PAST SURGICAL HISTORY:  No previous surgery.   FAMILY HISTORY:  Diabetes, hypertension, CVA.   PHYSICAL EXAMINATION:  VITAL SIGNS:  Stable, afebrile.  Fetal heart tracing  baseline 140's.  Average long term variability.  Occasional variable  decelerations down to 90 beats per minute, 20-30 seconds in duration.  Tocodynamometer:   Uterine contractions every two to four minutes.  GENERAL:  Uncomfortable.  STERILE VAGINAL EXAM:  Per the R.N.  Cervix is 2-3 cm dilated, 80-90%  effaced.   ASSESSMENT:  Multiparous at term, latent labor, suspicious fetal heart  tracing with variable decelerations.  However, overall consistent with fetal  well-being.   PLAN:  Admission.  Possible augmentation of labor with low dose Pitocin per  protocol.      Roseanna Rainbow, M.D.  Electronically Signed     LAJ/MEDQ  D:  07/06/2005  T:  07/06/2005  Job:  [id number]

## 2010-07-30 ENCOUNTER — Telehealth: Payer: Self-pay | Admitting: *Deleted

## 2010-07-30 NOTE — Telephone Encounter (Signed)
Received faxed notice from pharmacy, prior authorization needed for Flovent.  Prior authorization was denied, pt needs to try Qvar first. Will forward information to prescribing to make the appropriate changes to pt's chart and send new rx into [facility] pharmacy on [address].

## 2010-07-31 ENCOUNTER — Encounter: Payer: Self-pay | Admitting: Family Medicine

## 2010-07-31 ENCOUNTER — Ambulatory Visit (INDEPENDENT_AMBULATORY_CARE_PROVIDER_SITE_OTHER): Payer: Medicaid Other | Admitting: Family Medicine

## 2010-07-31 ENCOUNTER — Encounter: Payer: Self-pay | Admitting: *Deleted

## 2010-07-31 VITALS — BP 122/82 | HR 80 | Temp 98.5°F | Ht 63.0 in | Wt 160.0 lb

## 2010-07-31 DIAGNOSIS — M545 Low back pain, unspecified: Secondary | ICD-10-CM

## 2010-07-31 DIAGNOSIS — M79604 Pain in right leg: Secondary | ICD-10-CM

## 2010-07-31 MED ORDER — BECLOMETHASONE DIPROPIONATE 40 MCG/ACT IN AERS: 2.0000 | INHALATION_SPRAY | Freq: Two times a day (BID) | RESPIRATORY_TRACT | Status: DC

## 2010-07-31 MED ORDER — PREGABALIN 25 MG PO CAPS: 25.0000 mg | ORAL_CAPSULE | Freq: Two times a day (BID) | ORAL | Status: DC

## 2010-07-31 NOTE — Patient Instructions (Signed)
I doubt injections in your back will help given your MRI results but we can refer you to the doctor who does these to get his opinion if you would like. Start lyrica at a low dose at bedtime for a few days then increase to twice a day - this medicine is a nerve-blocking medicine that helps with pain and numbness.  There are a couple other medicines we could try (nortriptyline, cymbalta) if this isn't working. I would not repeat the prednisone as this didn't help you.  And the injection into your SI joint didn't provide long-lasting relief. Do some low back stretches as well as SI joint stretches (hold 20-30 seconds, do 3 of each) once a day. Consider doing physical therapy again. Strengthening of low back muscles, abdominal musculature are key for long term pain relief in most people. Follow up with Dr. Jennette Kettle in 2 weeks to recheck how you're doing on this medicine.

## 2010-07-31 NOTE — Progress Notes (Signed)
Subjective:    Patient ID: Sue [name], female    DOB: Aug 28, 1975, 35 y.o.   MRN: [id number]  Back Pain  Patient here for 1 month f/u low back pain.  Previously noted pain was in low back more on right side and radiated down right leg to about the foot. Denied weakness but had numbness into here. At last OV was given SI joint injection which she states helped some but pain never went away and pain persists at > 7/10 pain scale. + night pain No bowel or bladder dysfunction. Reports pain now is in left side of low back and goes into left leg.  Denies pain or numbness into right leg now. Not currently taking any meds. Tried oral prednisone which did not help. Pain has been ongoing > 5 years. Did physical therapy remotely and not interested in repeating this. States she has tried some of the home stretches and exercises they gave her. Discussed medication adjuncts and she said did not tolerate neurontin - made her sleepy. MRI performed October 2011 showed lumbar facet arthrosis and mild L5-S1 right sided posterolateral disc bulge.  Past Medical History  Diagnosis Date  . Asthma     mild by PFT's 03/16/1998  . Sciatica 01/12/06  . History of abnormal Pap smear     s/p cryo( Dr. Wiliam [name])  . Cervical lymphadenopathy     biopsy negativeMarvetta [name])  . Normal vaginal delivery     x3  . Complete spontaneous abortion     x2  . Hypertension   . HSV (herpes simplex virus) anogenital infection   . GERD (gastroesophageal reflux disease)     Current Outpatient Prescriptions on File Prior to Visit  Medication Sig Dispense Refill  . albuterol (PROVENTIL,VENTOLIN) 90 MCG/ACT inhaler Inhale 2 puffs into the lungs every 6 (six) hours as needed.  1 each  5  . amLODipine (NORVASC) 2.5 MG tablet Take 1 tablet (2.5 mg total) by mouth daily.  90 tablet  1  . beclomethasone (QVAR) 40 MCG/ACT inhaler Inhale 2 puffs into the lungs 2 (two) times daily.  1 Inhaler  12  . fluticasone (FLONASE) 50 MCG/ACT  nasal spray 2 sprays by Nasal route daily.  16 g  12  . fluticasone (FLOVENT HFA) 110 MCG/ACT inhaler Inhale 1 puff into the lungs 2 (two) times daily.  1 Inhaler  12  . HYDROcodone-homatropine (HYCODAN) 5-1.5 MG/5ML syrup Take 2.5 mLs by mouth every 6 (six) hours as needed for cough.  120 mL  2  . levonorgestrel-ethinyl estradiol (SEASONALE) 0.15-0.03 MG per tablet Take 1 tablet by mouth daily.        . mesalamine (CANASA) 1000 MG suppository Place 1,000 mg rectally once a week.        Marland Kitchen omeprazole (PRILOSEC) 20 MG capsule Take 1 capsule (20 mg total) by mouth daily.  30 capsule  3  . valACYclovir (VALTREX) 500 MG tablet Take 500 mg by mouth. As needed.       Marland Kitchen DISCONTD: HYDROcodone-acetaminophen (VICODIN) 5-500 MG per tablet Take 1 tablet by mouth every 6 (six) hours as needed.        Marland Kitchen DISCONTD: traMADol (ULTRAM) 50 MG tablet Take 50 mg by mouth every 6 (six) hours as needed.          Past Surgical History  Procedure Date  . Biopsy of lymph node in r neck 2007.    Allergies  Allergen Reactions  . Amoxicillin   . Sulfamethoxazole W/Trimethoprim  REACTION: septra    History   Social History  . Marital Status: Single    Spouse Name: N/A    Number of Children: N/A  . Years of Education: N/A   Occupational History  . Not on file.   Social History Main Topics  . Smoking status: Never Smoker   . Smokeless tobacco: Not on file  . Alcohol Use: Yes     occasional beer on weekends  . Drug Use: Not on file  . Sexually Active: Not on file   Other Topics Concern  . Not on file   Social History Narrative  . No narrative on file    Family History  Problem Relation Age of Onset  . Ulcerative colitis Mother   . Hypertension Mother   . Diabetes Father   . Heart attack Father   . Hypertension Father   . Diabetes Sister   . Heart attack Maternal Grandmother     BP 122/82  Pulse 80  Temp(Src) 98.5 F (36.9 C) (Oral)  Ht 5\' 3"  (1.6 m)  Wt 160 lb (72.576 kg)  BMI 28.34  kg/m2  Review of Systems  Musculoskeletal: Positive for back pain.      Objective:   Physical Exam Gen: NAD - moves slowly. Back: No gross deformity, scoliosis. TTP left lumbar paraspinal region, SI joint, and in buttock.  No midline bony or right paraspinal TTP. FROM but with pain on flexion and extension. Strength 4/5 all muscle groups left lower extremity.  4/5 right hip flexion, 5/5 all other right lower extremity muscle groups. Sensation intact to light touch bilaterally. SLRs negative bilaterally (pain in back both sides). Negative logroll bilateral hips. FABERs causes left sided back pain when testing both left and right SI joints.     Assessment & Plan:  1. Low back pain - patient with longstanding low back pain > 5 years.  Last MRI in October without evidence of radiculopathic source - only mild disc bulge on right side.  Last visit 1 month ago her pain was in right side and today pain is on left.  Some of exam non-anatomic though states weakness in left leg is limited 2/2 her pain.  SI joint injection from 1 month ago helped some per her report but not lasting and did not resolve her pain.  She is not interested in repeat PT.  Discussed ESIs unlikely to be helpful given her MRI results compared to her exam today.  She has tried neurontin in past and felt it made her to sleepy.  Discussed trying other adjuncts - lyrica, nortriptyline, or cymbalta.  Will start with trial of lyrica - advised that she may only be able to take at bedtime if it causes her sedation - will start at very low dose (25mg ) and titrate as needed.  Discussed continuing with home stretches/exercises if she does not want to pursue PT - for SI joint and low back pain.  F/u in 2 weeks with Sue [name] or Sue [name] to recheck how she's doing on lyrica.

## 2010-07-31 NOTE — Assessment & Plan Note (Signed)
patient with longstanding low back pain > 5 years.  Last MRI in October without evidence of radiculopathic source - only mild disc bulge on right side.  Last visit 1 month ago her pain was in right side and today pain is on left.  Some of exam non-anatomic though states weakness in left leg is limited 2/2 her pain.  SI joint injection from 1 month ago helped some per her report but not lasting and did not resolve her pain.  She is not interested in repeat PT.  Discussed ESIs unlikely to be helpful given her MRI results compared to her exam today.  She has tried neurontin in past and felt it made her to sleepy.  Discussed trying other adjuncts - lyrica, nortriptyline, or cymbalta.  Will start with trial of lyrica - advised that she may only be able to take at bedtime if it causes her sedation - will start at very low dose (25mg ) and titrate as needed.  Discussed continuing with home stretches/exercises if she does not want to pursue PT - for SI joint and low back pain.  F/u in 2 weeks with Dr. Jennette Kettle or Dr. Patsy Lager to recheck how she's doing on lyrica.

## 2010-07-31 NOTE — Telephone Encounter (Signed)
Sent new prescription for Qvar

## 2010-08-14 ENCOUNTER — Telehealth: Payer: Self-pay | Admitting: *Deleted

## 2010-08-14 DIAGNOSIS — K5120 Ulcerative (chronic) proctitis without complications: Secondary | ICD-10-CM

## 2010-08-14 NOTE — Telephone Encounter (Signed)
Call from pt said that she is having soreness in her rectal area.  Said that it hurts to have a bowel movement.  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  Thinks this may be happening again.  Wants a referral to Dr. Matthias Hughs as soon as possible.

## 2010-08-15 ENCOUNTER — Encounter: Payer: Self-pay | Admitting: Internal Medicine

## 2010-08-15 ENCOUNTER — Ambulatory Visit: Payer: Medicaid Other | Admitting: Family Medicine

## 2010-08-15 NOTE — Telephone Encounter (Signed)
Reviewed chart. 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 Is she still taking the mesalamine suppositories? Does she need Rx? Put in referral order.

## 2010-08-25 ENCOUNTER — Ambulatory Visit (INDEPENDENT_AMBULATORY_CARE_PROVIDER_SITE_OTHER): Payer: Medicaid Other | Admitting: Family Medicine

## 2010-08-25 ENCOUNTER — Encounter: Payer: Self-pay | Admitting: Family Medicine

## 2010-08-25 DIAGNOSIS — IMO0002 Reserved for concepts with insufficient information to code with codable children: Secondary | ICD-10-CM

## 2010-08-25 DIAGNOSIS — M5430 Sciatica, unspecified side: Secondary | ICD-10-CM

## 2010-08-25 DIAGNOSIS — M545 Low back pain, unspecified: Secondary | ICD-10-CM

## 2010-08-26 ENCOUNTER — Ambulatory Visit: Payer: Medicaid Other | Admitting: Family Medicine

## 2010-08-26 NOTE — Progress Notes (Signed)
  Subjective:    Patient ID: Sue [name], female    DOB: [birth year], 35 y.o.   MRN: [id number]  HPI  Another episode of low back and buttock pain that started a few days ago and now is almost intolerable. Seemed to start after she did a lot of sitting at a cook out. When she stood up, everything hurt. Pain is right sided, in buttock area, occasionally in past it has radiated some into her upper thigh but not this time. 8/10. Aching and burning. Difficulty walking standing or sitting.   Review of Systems    denies bowel or bladder incontinence. No wekness lower extremity, no numbness lower extremities. Objective:   Physical Exam     GENERAL: Well-developed, well-nourished, no acute distress. CARDIOVASCULAR: Regular rate and rhythm no murmur gallop or rub LUNGS: Clear to auscultation bilaterally, no rales or wheeze. ABDOMEN: Soft positive bowel sounds NEURO: No gross focal neurological deficits. MSK: Movement of extremity x 4. PELVIS: Asymmetrical PSIS on forward flexion at the hips with right side riding higher during flexion. TTP R SI joint area  INJECTION: Patient was given informed consent, signed copy in the chart. Appropriate time out was taken. Area prepped and draped in usual sterile fashion. 1 cc of kenalog plus  2 cc of lidocaine was injected into the right si joint  using a(n) posterior approach. The patient tolerated the procedure well. There were no complications. Post procedure instructions were given.   RECORD REVIEW: Reviewed images from her LS Spine MRI: agree with report with significant finding of ::L4-L5 also shows mild bilateral  facet hypertrophy without stenosis or neural compression. L5-S1  shows mild bilateral facet hypertrophy and a shallow right  posterolateral protrusion without neural compression or significant  stenosis.     Assessment & Plan:  recurrent buttock pain with some sx of sciatic nerve involvement but not radiculopathy into leg. Also--it  alternates sides. 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 This fits with her start of pain  Which was after birth of her last child. Today we gave her right SI injection, will set her up for pelvic PT and long term I have referred her for eval by Dr Wynn Banker for potential nerve block. Unfortunately they are scheduling 1st available in November. I will f/u her up after 4 weeks of PT or sooner prn.

## 2010-08-26 NOTE — Assessment & Plan Note (Signed)
recurrent buttock pain with some sx of sciatic nerve involvement but not radiculopathy into leg. Also--it alternates sides. 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 This fits with her start of pain  Which was after birth of her last child. Today we gave her right SI injection, will set her up for pelvic PT and long term I have referred her for eval by Dr Wynn Banker for potential nerve block. Unfortunately they are scheduling 1st available in November. I will f/u her up after 4 weeks of PT or sooner prn.

## 2010-09-09 ENCOUNTER — Ambulatory Visit: Payer: Medicaid Other | Admitting: Rehabilitative and Restorative Service Providers"

## 2010-10-15 ENCOUNTER — Ambulatory Visit (INDEPENDENT_AMBULATORY_CARE_PROVIDER_SITE_OTHER): Payer: Medicaid Other | Admitting: Internal Medicine

## 2010-10-15 VITALS — BP 110/80 | HR 80 | Temp 98.8°F | Ht 63.0 in | Wt 159.6 lb

## 2010-10-15 DIAGNOSIS — R059 Cough, unspecified: Secondary | ICD-10-CM

## 2010-10-15 DIAGNOSIS — R05 Cough: Secondary | ICD-10-CM

## 2010-10-15 DIAGNOSIS — R053 Chronic cough: Secondary | ICD-10-CM

## 2010-10-15 MED ORDER — AZITHROMYCIN 250 MG PO TABS: ORAL_TABLET | ORAL | Status: AC

## 2010-10-15 NOTE — Patient Instructions (Signed)
Please take your medicines as prescribed. Please schedule a follow up appointment in 2 months. Please call the clinic if your symptoms get worse.

## 2010-10-15 NOTE — Progress Notes (Signed)
  Subjective:    Patient ID: Sue [name], female    DOB: 20-Dec-1975, 35 y.o.   MRN: [id number]  HPI: 35 year old woman with past medical history significant for asthma, hypertension comes to the clinic for worsening of her chronic cough.  Patient is very frustrated with the complaint of chronic cough that has been persistent since March. She has tried treatment for allergies , anti-reflux medications and inhalers for Asthma but nothing works for her.  She states that she uses her albuterol and Qvar daily. For last 3 days she has been bringing up brownish phlegm and it hurts in her lower part of the chest/upper abdomen when she coughs. She has also been wheezing intermittently for last 3 days. She also reports constant postnasal drip along with that. Denies any fever, chills, nausea or vomiting.    Review of Systems  Constitutional: Negative for fever, activity change, appetite change and fatigue.  HENT: Positive for congestion and postnasal drip. Negative for nosebleeds, rhinorrhea and sneezing.   Eyes: Negative for photophobia and visual disturbance.  Respiratory: Positive for cough and wheezing. Negative for apnea, choking, chest tightness and stridor.   Cardiovascular: Positive for chest pain. Negative for palpitations and leg swelling.  Genitourinary: Negative for dysuria, urgency and frequency.  Musculoskeletal: Negative for arthralgias.  Neurological: Negative for dizziness, numbness and headaches.  Hematological: Negative for adenopathy.       Objective:   Physical Exam  Constitutional: She is oriented to person, place, and time. She appears well-developed and well-nourished. No distress.  HENT:  Head: Normocephalic and atraumatic.  Mouth/Throat: Oropharynx is clear and moist.  Eyes: Conjunctivae and EOM are normal. Pupils are equal, round, and reactive to light.  Neck: Normal range of motion. Neck supple. No JVD present. No tracheal deviation present. No thyromegaly present.    Cardiovascular: Normal rate, regular rhythm, normal heart sounds and intact distal pulses.  Exam reveals no gallop and no friction rub.   No murmur heard. Pulmonary/Chest: Effort normal. No stridor. No respiratory distress. She has wheezes. She has no rales. She exhibits no tenderness.  Abdominal: Soft. Bowel sounds are normal. She exhibits no distension and no mass. There is no tenderness. There is no rebound and no guarding.  Musculoskeletal: Normal range of motion. She exhibits no edema and no tenderness.  Lymphadenopathy:    She has no cervical adenopathy.  Neurological: She is alert and oriented to person, place, and time. She has normal reflexes. She displays normal reflexes. No cranial nerve deficit. She exhibits normal muscle tone. Coordination normal.  Skin: Skin is warm. She is not diaphoretic.          Assessment & Plan:

## 2010-10-16 ENCOUNTER — Encounter: Payer: Self-pay | Admitting: Internal Medicine

## 2010-10-16 MED ORDER — CHLORPHENIRAMINE MALEATE 4 MG PO TABS: 4.0000 mg | ORAL_TABLET | Freq: Two times a day (BID) | ORAL | Status: AC | PRN

## 2010-10-16 NOTE — Assessment & Plan Note (Addendum)
She reports worsening of her chronic cough for last 3 days, though it has always been persistent since 03/12. She also reports constant postnasal drip and she has tried anti-histamine( 2nd generation) medications for allergies, PPIs for GERD but nothing helps her. She uses her inhalers for asthma as directed but that has not been helping her. She states for last 3 days she has heard some intermittent wheezing and has been using albuterol more often. For her worsening cough for last 3 days, with brownish phlegm, I would try a short course of antibiotics with Z-pak for now.  Differential  for chronic cough include postnasal drip versus GERD versus non asthmatic eosinophilic bronchitis versus chronic bronchitis. I discussed his with Dr. Josem Kaufmann and we decided to give her a trial of first generation antihistamine that works best for postnasal drip and might be beneficial for her chronic cough. I  discussed with her about starting her on chlorpheniramine , explaining the risk vs benefits ratio( sedative side effects but might be beneficial for post nasal drip and chronic cough). She agreed but said that she might not be able to take it as directed, 2 times a day as she works. She was advised to try that and if that doesn't work taken at night at least. Ultimately,  if her cough persists despite trying all the measures then we might need to do bronchoscopy to evaluate for endobronchial lesions.

## 2010-12-04 ENCOUNTER — Telehealth: Payer: Self-pay | Admitting: Family Medicine

## 2010-12-04 MED ORDER — PREDNISONE 50 MG PO TABS: 50.0000 mg | ORAL_TABLET | Freq: Every day | ORAL | Status: AC

## 2010-12-04 NOTE — Telephone Encounter (Signed)
Message copied by Nestor Ramp on Thu Dec 04, 2010 10:57 AM ------      Message from: Annita Brod      Created: Thu Dec 04, 2010  8:57 AM      Regarding: FW: prednisone rx request      Contact: [phone]                   ----- Message -----         From: Lizbeth Bark         Sent: 12/04/2010   8:35 AM           To: Lillia Pauls, CMA      Subject: prednisone rx request                                    Patient states she is having  sciatica pain since Sunday. We didn't have any appts so I tried to get her into [facility] today, but she states she has to work.  But would like that rx sent to [facility] [address]. I told her she hasn't been here since June, not sure the doctor will call that in without being seen.

## 2010-12-04 NOTE — Telephone Encounter (Signed)
Neeton I have entered a rx and sent iot Let her know if she hs not better by Monday we need to see her \\THANKS ! Denny Levy

## 2010-12-05 NOTE — Telephone Encounter (Signed)
Yes, Sue [name] said he took care of it.

## 2010-12-05 NOTE — Telephone Encounter (Signed)
Amy Did this get done [facility]! Denny Levy

## 2010-12-08 LAB — POCT RAPID STREP A: Streptococcus, Group A Screen (Direct): NEGATIVE

## 2010-12-26 ENCOUNTER — Other Ambulatory Visit: Payer: Self-pay | Admitting: Internal Medicine

## 2011-01-08 ENCOUNTER — Emergency Department (HOSPITAL_COMMUNITY)
Admission: EM | Admit: 2011-01-08 | Discharge: 2011-01-09 | Disposition: A | Discharge status: Home / Self Care | Payer: Medicaid Other | Attending: Emergency Medicine | Admitting: Emergency Medicine

## 2011-01-08 DIAGNOSIS — J45909 Unspecified asthma, uncomplicated: Secondary | ICD-10-CM | POA: Insufficient documentation

## 2011-01-08 DIAGNOSIS — M62838 Other muscle spasm: Secondary | ICD-10-CM | POA: Insufficient documentation

## 2011-01-08 DIAGNOSIS — I10 Essential (primary) hypertension: Secondary | ICD-10-CM | POA: Insufficient documentation

## 2011-01-08 DIAGNOSIS — R109 Unspecified abdominal pain: Secondary | ICD-10-CM | POA: Insufficient documentation

## 2011-01-08 DIAGNOSIS — R079 Chest pain, unspecified: Secondary | ICD-10-CM | POA: Insufficient documentation

## 2011-01-08 DIAGNOSIS — K219 Gastro-esophageal reflux disease without esophagitis: Secondary | ICD-10-CM | POA: Insufficient documentation

## 2011-01-08 LAB — CBC
HCT: 38.5 % (ref 36.0–46.0)
Hemoglobin: 13.6 g/dL (ref 12.0–15.0)
MCH: 30.5 pg (ref 26.0–34.0)
MCHC: 35.3 g/dL (ref 30.0–36.0)
MCV: 86.3 fL (ref 78.0–100.0)
Platelets: 346 10*3/uL (ref 150–400)
RBC: 4.46 MIL/uL (ref 3.87–5.11)
RDW: 13.5 % (ref 11.5–15.5)
WBC: 7.1 10*3/uL (ref 4.0–10.5)

## 2011-01-08 LAB — DIFFERENTIAL
Basophils Absolute: 0 10*3/uL (ref 0.0–0.1)
Basophils Relative: 0 % (ref 0–1)
Eosinophils Absolute: 0.2 10*3/uL (ref 0.0–0.7)
Eosinophils Relative: 2 % (ref 0–5)
Lymphocytes Relative: 43 % (ref 12–46)
Lymphs Abs: 3.1 10*3/uL (ref 0.7–4.0)
Monocytes Absolute: 0.5 10*3/uL (ref 0.1–1.0)
Monocytes Relative: 7 % (ref 3–12)
Neutro Abs: 3.4 10*3/uL (ref 1.7–7.7)
Neutrophils Relative %: 47 % (ref 43–77)

## 2011-01-09 LAB — BASIC METABOLIC PANEL
BUN: 9 mg/dL (ref 6–23)
CO2: 28 mEq/L (ref 19–32)
Calcium: 9 mg/dL (ref 8.4–10.5)
Chloride: 101 mEq/L (ref 96–112)
Creatinine, Ser: 0.86 mg/dL (ref 0.50–1.10)
GFR calc Af Amer: >90 mL/min (ref >90)
GFR calc non Af Amer: 87 mL/min — ABNORMAL LOW (ref >90)
Glucose, Bld: 102 mg/dL — ABNORMAL HIGH (ref 70–99)
Potassium: 3 mEq/L — ABNORMAL LOW (ref 3.5–5.1)
Sodium: 138 mEq/L (ref 135–145)

## 2011-01-09 LAB — URINALYSIS, ROUTINE W REFLEX MICROSCOPIC
Bilirubin Urine: NEGATIVE
Bilirubin Urine: NEGATIVE
Glucose, UA: NEGATIVE mg/dL
Glucose, UA: NEGATIVE mg/dL
Ketones, ur: 15 mg/dL — AB
Ketones, ur: NEGATIVE mg/dL
Leukocytes, UA: NEGATIVE
Nitrite: NEGATIVE
Nitrite: NEGATIVE
Protein, ur: 30 mg/dL — AB
Protein, ur: NEGATIVE mg/dL
Specific Gravity, Urine: 1.02 (ref 1.005–1.030)
Specific Gravity, Urine: 1.023 (ref 1.005–1.030)
Urobilinogen, UA: 1 mg/dL (ref 0.0–1.0)
Urobilinogen, UA: 1 mg/dL (ref 0.0–1.0)
pH: 7 (ref 5.0–8.0)
pH: 6 (ref 5.0–8.0)

## 2011-01-09 LAB — URINE MICROSCOPIC-ADD ON

## 2011-01-09 LAB — POCT PREGNANCY, URINE: Preg Test, Ur: NEGATIVE

## 2011-02-05 ENCOUNTER — Ambulatory Visit: Payer: Medicaid Other

## 2011-02-09 ENCOUNTER — Encounter: Payer: Self-pay | Admitting: Family Medicine

## 2011-02-09 ENCOUNTER — Ambulatory Visit (INDEPENDENT_AMBULATORY_CARE_PROVIDER_SITE_OTHER): Payer: Medicaid Other | Admitting: Family Medicine

## 2011-02-09 DIAGNOSIS — M25539 Pain in unspecified wrist: Secondary | ICD-10-CM

## 2011-02-09 DIAGNOSIS — M25571 Pain in right ankle and joints of right foot: Secondary | ICD-10-CM

## 2011-02-09 DIAGNOSIS — M25579 Pain in unspecified ankle and joints of unspecified foot: Secondary | ICD-10-CM

## 2011-02-09 NOTE — Assessment & Plan Note (Signed)
Right achilles strain - no evidence partial or full thickness tears on ultrasound.  Dx mild strain.  Should improve with time and home exercise program (demonstrated today).  Icing, tylenol as needed for pain.  Heel lifts provided.  See instructions for further.

## 2011-02-09 NOTE — Progress Notes (Addendum)
Subjective:    Patient ID: Sue [name], female    DOB: [birth year], 35 y.o.   MRN: [id number]  HPI 35 yo F here for right calf/achilles injury  Patient reports on Saturday 12/1 she was walking in 6 inch heels when she inverted right ankle. Caused posterior pain in achilles and some swelling/bruising. Able to bear weight following this and went out dancing that night. Has been icing since then. Swelling has gone down and feels some better since the injury. No prior injuries to right lower leg/calf. Pain worse when does a calf raise.  Also reports numbness into right hand palmar side worse in morning - no prior h/o carpal tunnel - asking about initial treatment options. No radiation up forearm.  No weakness.  Past Medical History  Diagnosis Date  . Asthma     mild by PFT's 03/16/1998  . Sciatica 01/12/06  . History of abnormal Pap smear     s/p cryo( Dr. Wiliam Ke)  . Cervical lymphadenopathy     biopsy negativeMarvetta Gibbons)  . Normal vaginal delivery     x3  . Complete spontaneous abortion     x2  . Hypertension   . HSV (herpes simplex virus) anogenital infection   . GERD (gastroesophageal reflux disease)   . 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     Current Outpatient Prescriptions on File Prior to Visit  Medication Sig Dispense Refill  . albuterol (PROVENTIL,VENTOLIN) 90 MCG/ACT inhaler Inhale 2 puffs into the lungs every 6 (six) hours as needed.  1 each  5  . amLODipine (NORVASC) 2.5 MG tablet take 1 tablet by mouth once daily  90 tablet  1  . beclomethasone (QVAR) 40 MCG/ACT inhaler Inhale 2 puffs into the lungs 2 (two) times daily.  1 Inhaler  12  . fluticasone (FLONASE) 50 MCG/ACT nasal spray 2 sprays by Nasal route daily.  16 g  12  . levonorgestrel-ethinyl estradiol (SEASONALE) 0.15-0.03 MG per tablet Take 1 tablet by  mouth daily.        . mesalamine (CANASA) 1000 MG suppository Place 1,000 mg rectally once a week.        Marland Kitchen omeprazole (PRILOSEC) 20 MG capsule Take 1 capsule (20 mg total) by mouth daily.  30 capsule  3  . pregabalin (LYRICA) 25 MG capsule Take 1 capsule (25 mg total) by mouth 2 (two) times daily.  60 capsule  2  . valACYclovir (VALTREX) 500 MG tablet Take 500 mg by mouth. As needed.         Past Surgical History  Procedure Date  . Biopsy of lymph node in r neck 2007.    Allergies  Allergen Reactions  . Amoxicillin   . Sulfamethoxazole W/Trimethoprim     REACTION: septra    History   Social History  . Marital Status: Single    Spouse Name: N/A    Number of Children: N/A  . Years of Education: N/A   Occupational History  . Not on file.   Social History Main Topics  . Smoking status: Never Smoker   . Smokeless tobacco: Not on file  . Alcohol Use: Yes     occasional beer on weekends  . Drug Use: Not on file  . Sexually Active: Not on file   Other Topics Concern  . Not on file   Social History Narrative  .  No narrative on file    Family History  Problem Relation Age of Onset  . Ulcerative colitis Mother   . Hypertension Mother   . Diabetes Father   . Heart attack Father   . Hypertension Father   . Diabetes Sister   . Heart attack Maternal Grandmother     BP 119/81  Ht 5\' 3"  (1.6 m)  Wt 160 lb (72.576 kg)  BMI 28.34 kg/m2  Review of Systems See HPI.    Objective:   Physical Exam Gen: NAD  R ankle: No gross deformity, swelling, ecchymoses FROM - mild pain on plantarflexion in achilles. TTP 3-4 cm proximal to achilles insertion on calcaneus. Negative ant drawer and talar tilt.   Negative syndesmotic compression. Thompsons test negative. NV intact distally.  MSK u/s: R achilles - no evidence partial or full thickness tears on long and transverse views.  No thickening and no increased neovascularity.    Assessment & Plan:  1. Right achilles  strain - no evidence partial or full thickness tears on ultrasound.  Dx mild strain.  Should improve with time and home exercise program (demonstrated today).  Icing, tylenol as needed for pain.  Heel lifts provided.  See instructions for further.  2. Right wrist pain/numbness - trial cock-up wrist splint at bedtime for carpal tunnel syndrome.  If not improving, will consider ultrasound, cortisone injection.

## 2011-02-09 NOTE — Patient Instructions (Signed)
You strained your achilles tendon Take tylenol and/or aleve as needed for pain Lowering/raise on a step exercises 3 x 15 once or twice a day - two feet first then one (start with 3 x 6) Can add heel walks, toe walks forward and backward as well Ice area 15 minutes at a time 3-4 times a day. Avoid uneven ground, hills. Heel lifts to decrease strain on the area. This should improve over the next month if not sooner.

## 2011-02-09 NOTE — Assessment & Plan Note (Signed)
trial cock-up wrist splint at bedtime for carpal tunnel syndrome.  If not improving, will consider ultrasound, cortisone injection.

## 2011-02-25 ENCOUNTER — Ambulatory Visit (INDEPENDENT_AMBULATORY_CARE_PROVIDER_SITE_OTHER): Payer: Medicaid Other

## 2011-02-25 DIAGNOSIS — Z23 Encounter for immunization: Secondary | ICD-10-CM

## 2011-04-10 ENCOUNTER — Encounter: Payer: Self-pay | Admitting: Internal Medicine

## 2011-04-10 ENCOUNTER — Ambulatory Visit (INDEPENDENT_AMBULATORY_CARE_PROVIDER_SITE_OTHER): Payer: Medicaid Other | Admitting: Internal Medicine

## 2011-04-10 DIAGNOSIS — I10 Essential (primary) hypertension: Secondary | ICD-10-CM

## 2011-04-10 DIAGNOSIS — R42 Dizziness and giddiness: Secondary | ICD-10-CM

## 2011-04-10 DIAGNOSIS — G5600 Carpal tunnel syndrome, unspecified upper limb: Secondary | ICD-10-CM

## 2011-04-10 DIAGNOSIS — G44209 Tension-type headache, unspecified, not intractable: Secondary | ICD-10-CM

## 2011-04-10 MED ORDER — CYCLOBENZAPRINE HCL 10 MG PO TABS: 10.0000 mg | ORAL_TABLET | Freq: Three times a day (TID) | ORAL | Status: AC | PRN

## 2011-04-10 MED ORDER — NAPROXEN 375 MG PO TABS: 375.0000 mg | ORAL_TABLET | Freq: Two times a day (BID) | ORAL | Status: DC

## 2011-04-10 MED ORDER — MECLIZINE HCL 25 MG PO TABS: 25.0000 mg | ORAL_TABLET | Freq: Three times a day (TID) | ORAL | Status: DC | PRN

## 2011-04-10 NOTE — Patient Instructions (Signed)
Please follow up if your symptoms do not improve. Please take Meclizine for dizziness. Please take naproxen for pain. Please take Flexeril as a muscle relaxant. Please use heating pad.

## 2011-04-11 NOTE — Progress Notes (Signed)
Subjective:     Patient ID: Sue [name], female   DOB: 1975-06-26, 36 y.o.   MRN: [id number]  HPI Sue [name] is a 36 year old woman with past medical history significant for hypertension, ulcerative proctitis and asthma who presents to the clinic for dizziness and neck pain.  1.) Dizziness - patient describes dizziness as feeling unsteady on her feet as well as objects in the mood room moving around her. She reports her symptoms get worse upon getting up. Symptoms began 2 days prior to her clinic visit. No vision abnormalities reported. No nausea or vomiting. Of note patient reports that her mother and sister both suffer from vertigo and are on meclizine for this.  2.) Neck pain - patient reports that the right side of her neck has been stiff but the past couple of days. She is able to move it however when moving into the right side she experiences more pain. No fevers, cough or chills reported. Patient has not been in contact with any sick contacts. No neck pain has caused her mild headache but no vision changes have been reported. No numbness, tingling or weakness reported.  No other complaints or concerns.  Review of Systems  Constitutional: Negative for fever, chills and fatigue.  HENT: Positive for neck stiffness. Negative for hearing loss, ear pain, tinnitus and ear discharge.   Respiratory: Negative for cough and shortness of breath.   Musculoskeletal: Negative for back pain and gait problem.  All other systems reviewed and are negative.       Objective:   Physical Exam  Constitutional: She is oriented to person, place, and time. She appears well-developed.  HENT:  Head: Normocephalic.  Right Ear: External ear normal.  Left Ear: External ear normal.  Nose: Nose normal.  Mouth/Throat: Oropharynx is clear and moist.  Eyes: Conjunctivae are normal. Pupils are equal, round, and reactive to light.  Neck:       Can move her neck to the left without any difficulties. Some difficulty  when moving neck to right side 2 to pain. No obvious masses palpated.  Cardiovascular: Normal rate and regular rhythm.   Pulmonary/Chest: Effort normal and breath sounds normal.  Abdominal: Soft. Bowel sounds are normal.  Musculoskeletal: Normal range of motion. She exhibits no edema.  Neurological: She is alert and oriented to person, place, and time. No cranial nerve deficit. Coordination normal.       Normal heel-to-shin. Romberg sign negative Normal tight rope walk Attempted to perform  BPPV maneuvers but could not as patient was unable to do so

## 2011-04-11 NOTE — Assessment & Plan Note (Signed)
Neck pain most consistent with cervical strain. ROM was wnl left side, but decreased when moving neck to right. There was some tension on right side of neck, possible muscle strain. May also be tension headache. No fevers, chills, or other symptoms to suggest meningitis. Will try anti-inflammatories and flexeril. If symptoms do not improve or worsen patient was advised to contact clinic or go to ER.

## 2011-04-11 NOTE — Assessment & Plan Note (Signed)
Lab Results  Component Value Date   NA 138 01/08/2011   K 3.0* 01/08/2011   CL 101 01/08/2011   CO2 28 01/08/2011   BUN 9 01/08/2011   CREATININE 0.86 01/08/2011    BP Readings from Last 3 Encounters:  04/10/11 142/97  02/09/11 119/81  10/15/10 110/80    Assessment: Hypertension control:  mildly elevated  Progress toward goals:  at goal Barriers to meeting goals:  no barriers identified  Plan: Hypertension treatment:  continue current medications

## 2011-04-11 NOTE — Assessment & Plan Note (Signed)
Exact etiology unknown at this point. BPPV on differential but unable to elicit any maneuvers. Patient is not orthostatic. Physical exam was unremarkable and neurologic function was intact. Dizziness, thus may be idiopathic, vs vertigo. No nystagmus, hearing abnormalities, but is worse with positional movement. Will start patient on anti-vert for symptomatic management, and advised patient to contact clinic if symptoms do not improve or worsen.

## 2011-04-23 ENCOUNTER — Ambulatory Visit (INDEPENDENT_AMBULATORY_CARE_PROVIDER_SITE_OTHER): Payer: Medicaid Other | Admitting: Family Medicine

## 2011-04-23 ENCOUNTER — Encounter: Payer: Self-pay | Admitting: Family Medicine

## 2011-04-23 VITALS — BP 131/82 | HR 80 | Temp 98.5°F | Ht 63.0 in | Wt 160.0 lb

## 2011-04-23 DIAGNOSIS — M5430 Sciatica, unspecified side: Secondary | ICD-10-CM

## 2011-04-23 MED ORDER — TRAMADOL HCL 50 MG PO TABS: 50.0000 mg | ORAL_TABLET | Freq: Three times a day (TID) | ORAL | Status: AC | PRN

## 2011-04-23 MED ORDER — PREDNISONE (PAK) 10 MG PO TABS: ORAL_TABLET | ORAL | Status: DC

## 2011-04-23 NOTE — Patient Instructions (Signed)
You have left sided sciatica, gluteal spasms, with underlying SI joint dysfunction. Start prednisone dose pack - can go back to taking ibuprofen when you're finished with this. Tramadol as needed for severe pain - no driving on this. Heat or ice as needed for 15 minutes at a time for pain. Start PT again - likely wait a few days (at least to next Monday) before starting. We will put in the referral again to Dr. Wynn Banker. Follow up with me as needed.

## 2011-04-24 ENCOUNTER — Encounter: Payer: Self-pay | Admitting: Family Medicine

## 2011-04-24 NOTE — Assessment & Plan Note (Signed)
MRI from 2011 reassuring.  Do not think injection at this time (either SI, ESI, or facet) would be helpful based on location of her pain.  Has had excellent results in past with oral prednisone so will burst this as well as give rx for tramadol to take.  Continue flexeril as needed.  She will try PT again as well.  She is willing to see Dr. Wynn Banker now - will set up appointment again.

## 2011-04-24 NOTE — Progress Notes (Signed)
Subjective:    Patient ID: Sue [name], female    DOB: 1975/10/16, 36 y.o.   MRN: [id number]  Back Pain  36 yo F here for f/u low back pain.  Patient reports overall her back/sciatica pain was doing very well until Monday evening. Has progressed since then. Pain in left buttock with radiation down left leg (leg feels like a 'dead weight'). Reports numbness as well in this distribution. Difficulty standing and walking. No bowel/bladder dysfunction. No symptoms into right side. Has been using heating pad, flexeril, ibuprofen without much help. Had MRI in 2011 which did not show any central stenosis or foraminal encroachment. Has been given SI joint injections in past - states last one caused her to hurt more than it did to help (though a prior one did help she states).  However, pain at that time was right sided - her symptoms now are on the left so has had improvement. Done well in past with prednisone. Has tried PT a couple times. Did not see Dr. Wynn Banker as was set up by Dr. Jennette Kettle - states because it was around the time of aspergillus contamination and was afraid.  Past Medical History  Diagnosis Date  . Asthma     mild by PFT's 03/16/1998  . Sciatica 01/12/06  . History of abnormal Pap smear     s/p cryo( Dr. Wiliam Ke)  . Cervical lymphadenopathy     biopsy negativeMarvetta Gibbons)  . Normal vaginal delivery     x3  . Complete spontaneous abortion     x2  . Hypertension   . HSV (herpes simplex virus) anogenital infection   . GERD (gastroesophageal reflux disease)   . 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     Current Outpatient Prescriptions on File Prior to Visit  Medication Sig Dispense Refill  . albuterol (PROVENTIL,VENTOLIN) 90 MCG/ACT inhaler Inhale 2 puffs into the lungs every 6 (six) hours as needed.  1 each  5  .  amLODipine (NORVASC) 2.5 MG tablet take 1 tablet by mouth once daily  90 tablet  1  . beclomethasone (QVAR) 40 MCG/ACT inhaler Inhale 2 puffs into the lungs 2 (two) times daily.  1 Inhaler  12  . cyclobenzaprine (FLEXERIL) 10 MG tablet Take 1 tablet (10 mg total) by mouth every 8 (eight) hours as needed for muscle spasms.  30 tablet  1  . fluticasone (FLONASE) 50 MCG/ACT nasal spray 2 sprays by Nasal route daily.  16 g  12  . levonorgestrel-ethinyl estradiol (SEASONALE) 0.15-0.03 MG per tablet Take 1 tablet by mouth daily.        . meclizine (ANTIVERT) 25 MG tablet Take 1 tablet (25 mg total) by mouth 3 (three) times daily as needed for dizziness or nausea.  30 tablet  1  . mesalamine (CANASA) 1000 MG suppository Place 1,000 mg rectally once a week.        . naproxen (NAPROSYN) 375 MG tablet Take 1 tablet (375 mg total) by mouth 2 (two) times daily with a meal.  60 tablet  2  . omeprazole (PRILOSEC) 20 MG capsule Take 1 capsule (20 mg total) by mouth daily.  30 capsule  3  . valACYclovir (VALTREX) 500 MG tablet Take 500 mg by mouth. As needed.         Past Surgical History  Procedure Date  . Biopsy  of lymph node in r neck 2007.    Allergies  Allergen Reactions  . Amoxicillin   . Sulfamethoxazole W/Trimethoprim     REACTION: septra    History   Social History  . Marital Status: Single    Spouse Name: N/A    Number of Children: N/A  . Years of Education: N/A   Occupational History  . Not on file.   Social History Main Topics  . Smoking status: Never Smoker   . Smokeless tobacco: Not on file  . Alcohol Use: Yes     occasional beer on weekends  . Drug Use: Not on file  . Sexually Active: Not on file   Other Topics Concern  . Not on file   Social History Narrative  . No narrative on file    Family History  Problem Relation Age of Onset  . Ulcerative colitis Mother   . Hypertension Mother   . Diabetes Father   . Heart attack Father   . Hypertension Father   .  Diabetes Sister   . Heart attack Maternal Grandmother     BP 131/82  Pulse 80  Temp(Src) 98.5 F (36.9 C) (Oral)  Ht 5\' 3"  (1.6 m)  Wt 160 lb (72.576 kg)  BMI 28.34 kg/m2  Review of Systems  Musculoskeletal: Positive for back pain.  See HPI above.   Objective:   Physical Exam Gen: Appears uncomfortable, seated in wheelchair but no acute distress.  Back: No gross deformity, scoliosis. TTP left buttock and low left lumbar paraspinal region.  No midline or bony TTP.  No right sided or SI TTP currently. Full extension, states unable to flex at all due to pain left buttock. Strength LEs 5/5 all muscle groups on right, all 4/5 on left but limited due to pain.   1+ MSRs in patellar and achilles tendons, equal bilaterally. Positive SLR on left, negative right. Sensation intact to light touch bilaterally. Negative logroll bilateral hips.     LS spine MRI from 2011: L4-L5 also shows mild bilateral facet hypertrophy without stenosis or neural compression. L5-S1 shows mild bilateral facet hypertrophy and a shallow right posterolateral protrusion without neural compression or significant stenosis.   Assessment & Plan:  1. Left sciatica - MRI from 2011 reassuring.  Do not think injection at this time (either SI, ESI, or facet) would be helpful based on location of her pain.  Has had excellent results in past with oral prednisone so will burst this as well as give rx for tramadol to take.  Continue flexeril as needed.  She will try PT again as well.  She is willing to see Dr. Wynn Banker now - will set up appointment again.

## 2011-05-18 ENCOUNTER — Ambulatory Visit (HOSPITAL_BASED_OUTPATIENT_CLINIC_OR_DEPARTMENT_OTHER): Payer: Medicaid Other | Admitting: Physical Medicine & Rehabilitation

## 2011-05-18 ENCOUNTER — Encounter: Payer: Self-pay | Admitting: Physical Medicine & Rehabilitation

## 2011-05-18 ENCOUNTER — Encounter: Payer: Medicaid Other | Attending: Physical Medicine & Rehabilitation

## 2011-05-18 DIAGNOSIS — M545 Low back pain, unspecified: Secondary | ICD-10-CM

## 2011-05-18 DIAGNOSIS — M7062 Trochanteric bursitis, left hip: Secondary | ICD-10-CM

## 2011-05-18 DIAGNOSIS — G8929 Other chronic pain: Secondary | ICD-10-CM | POA: Insufficient documentation

## 2011-05-18 DIAGNOSIS — R209 Unspecified disturbances of skin sensation: Secondary | ICD-10-CM | POA: Insufficient documentation

## 2011-05-18 DIAGNOSIS — M76899 Other specified enthesopathies of unspecified lower limb, excluding foot: Secondary | ICD-10-CM

## 2011-05-18 DIAGNOSIS — M129 Arthropathy, unspecified: Secondary | ICD-10-CM | POA: Insufficient documentation

## 2011-05-18 DIAGNOSIS — M25559 Pain in unspecified hip: Secondary | ICD-10-CM | POA: Insufficient documentation

## 2011-05-18 NOTE — Progress Notes (Signed)
Subjective:    Patient ID: Sue [name], female    DOB: December 23, 1975, 36 y.o.   MRN: [id number]  HPI 36 year old female who had onset of low back pain in 2007 following the delivery of her child. The pain was initially one-sided but now is affecting either leg. She initially had some physical therapy. She's tried oral prednisone. Had an intramuscular cortisone injection. No epidural injections no sacroiliac injections no facet injections. Oral prednisone did seem to help. As tried various pain medicines with limited results. MRI has been reviewed from 2011. No significant disc problems. Mild degenerative changes L4-5 L5-S1 facet joints. Pain comes and goes and usually last for 2-3 weeks. She can be pain free for up to a couple months at a time. Pain Inventory Average Pain 10 Pain Right Now 4 My pain is sharp, dull and "like being tased w/ a taser gun" intermittent, lasting for longer period each time it occurs. This time since mid February  In the last 24 hours, has pain interfered with the following? General activity 7 Relation with others 8 Enjoyment of life 8 What TIME of day is your pain at its worst? daytime Sleep (in general) Poor  Pain is worse with: walking, bending, sitting and standing Pain improves with: medication Relief from Meds: 6  Mobility Can walk more than 30 min but it is painful ability to climb steps?  yes do you drive?  yes Do you have any goals in this area?  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  no  Neuro/Psych weakness numbness trouble walking spasms  Prior Studies x-rays CT/MRI  Physicians involved in your care Primary care [facility] and [facility]       Review of Systems  Constitutional: Negative.   Eyes: Negative.   Respiratory: Negative.   Cardiovascular: Negative.   Gastrointestinal: Positive for abdominal pain (uses a suppository for  intermittent abd cramping).  Genitourinary: Negative.   Musculoskeletal: Positive for myalgias.  Skin: Negative.   Neurological: Positive for headaches (recent tension h/a w/ vertigo; acute).  Hematological: Negative.   Psychiatric/Behavioral: Negative.        Objective:   Physical Exam  Constitutional: She is oriented to person, place, and time. She appears well-developed and well-nourished.  HENT:  Head: Normocephalic and atraumatic.  Musculoskeletal: Normal range of motion.       Right hip: She exhibits tenderness.       Left hip: She exhibits bony tenderness.       Lumbar back: She exhibits normal range of motion, no tenderness, no bony tenderness, no swelling, no edema, no deformity and no spasm.       Tenderness to palpation over bilateral greater trochanters  Neurological: She is alert and oriented to person, place, and time. She has normal strength and normal reflexes. No sensory deficit. Gait normal.       Straight leg raising test is negative  Psychiatric: She has a normal mood and affect. Her behavior is normal. Judgment and thought content normal.          Assessment & Plan:  1. Chronic low back, buttock, hip pain. On examination she has entered his over bilateral greater trochanters. The remainder of her spine and neuro exam are normal. Her MRI lumbar spine has been reviewed and explained to the patient using the actual films. No signs of discogenic disease other than a tiny disc bulge  unilateral right L5-S1. Do not think it is significant. There is mild facet arthropathy L4-5 and L5-S1. Other potential pain generators include the sacroiliac joint and historically this seems to have occurred after childbirth. Will inject the greater trochanter on the left side today. If this is helpful center through therapy for TFL stretching and perhaps to the right hip injection next month. If not helpful we'll proceed with sacroiliac injection next month. Discussed with patient agrees  with plan.  2. Lower stem and the paresthesias. No compressive lesions noted on MRI. No history of diabetes. Peripheral neuropathy is a possibility but overall has few risk factors. May need to do an EMG/NCV once her primary complaint is resolved.  Left trochanteric bursa injection  Informed consent was obtained Risks and benefits of the seizure with patient these include bleeding bruising and infection she elects to proceed and has given written consent. Patient placed in a right lateral decubitus position. The area of the left greater trochanter marked and prepped Betadine and alcohol. Entered with a 25-gauge inch and half needle. After negative drop back for blood, a solution containing 1 cc of 40 mg per cc Depo-Medrol and 4 cc of 1% lidocaine were injected. Patient tolerated procedure well. Post procedure instructions given.

## 2011-05-18 NOTE — Patient Instructions (Signed)

## 2011-06-11 ENCOUNTER — Encounter: Payer: Self-pay | Admitting: Physical Medicine & Rehabilitation

## 2011-06-22 ENCOUNTER — Encounter: Payer: Medicaid Other | Attending: Physical Medicine & Rehabilitation

## 2011-06-22 ENCOUNTER — Ambulatory Visit: Payer: Medicaid Other | Admitting: Physical Medicine & Rehabilitation

## 2011-06-22 DIAGNOSIS — G8929 Other chronic pain: Secondary | ICD-10-CM | POA: Insufficient documentation

## 2011-06-22 DIAGNOSIS — M545 Low back pain, unspecified: Secondary | ICD-10-CM | POA: Insufficient documentation

## 2011-06-22 DIAGNOSIS — M25559 Pain in unspecified hip: Secondary | ICD-10-CM | POA: Insufficient documentation

## 2011-06-22 DIAGNOSIS — R209 Unspecified disturbances of skin sensation: Secondary | ICD-10-CM | POA: Insufficient documentation

## 2011-06-22 DIAGNOSIS — M129 Arthropathy, unspecified: Secondary | ICD-10-CM | POA: Insufficient documentation

## 2011-07-06 ENCOUNTER — Telehealth: Payer: Self-pay | Admitting: Physical Medicine & Rehabilitation

## 2011-07-06 ENCOUNTER — Telehealth: Payer: Self-pay

## 2011-07-06 NOTE — Telephone Encounter (Signed)
Lm for pt to call back regarding her pain.

## 2011-07-06 NOTE — Telephone Encounter (Signed)
Advised pt to try taking her naprosyn but she says nothing works except injections and prednisone.  Is there something we can give her until her appt May 9th for injection?

## 2011-07-06 NOTE — Telephone Encounter (Signed)
Really bad back pain.  Can you call in something?

## 2011-07-07 NOTE — Telephone Encounter (Signed)
Prednisone is not a good medicine to take on a frequent basis. In addition she received a cortisone injection last month and will get one next week. We can try a different nonsteroidal anti-inflammatory such as ibuprofen 800 mg 3 times per day #21 refill x1

## 2011-07-08 NOTE — Telephone Encounter (Signed)
Pt informed medication was called into [facility].

## 2011-07-16 ENCOUNTER — Encounter: Payer: Self-pay | Admitting: Physical Medicine & Rehabilitation

## 2011-07-16 ENCOUNTER — Encounter: Payer: Medicaid Other | Attending: Physical Medicine & Rehabilitation

## 2011-07-16 ENCOUNTER — Ambulatory Visit (HOSPITAL_BASED_OUTPATIENT_CLINIC_OR_DEPARTMENT_OTHER): Payer: Medicaid Other | Admitting: Physical Medicine & Rehabilitation

## 2011-07-16 VITALS — BP 133/79 | HR 79 | Resp 16 | Ht 63.0 in | Wt 158.0 lb

## 2011-07-16 DIAGNOSIS — M545 Low back pain, unspecified: Secondary | ICD-10-CM | POA: Insufficient documentation

## 2011-07-16 DIAGNOSIS — M129 Arthropathy, unspecified: Secondary | ICD-10-CM | POA: Insufficient documentation

## 2011-07-16 DIAGNOSIS — G8929 Other chronic pain: Secondary | ICD-10-CM | POA: Insufficient documentation

## 2011-07-16 DIAGNOSIS — R209 Unspecified disturbances of skin sensation: Secondary | ICD-10-CM | POA: Insufficient documentation

## 2011-07-16 DIAGNOSIS — M25559 Pain in unspecified hip: Secondary | ICD-10-CM | POA: Insufficient documentation

## 2011-07-16 DIAGNOSIS — M533 Sacrococcygeal disorders, not elsewhere classified: Secondary | ICD-10-CM

## 2011-07-16 NOTE — Progress Notes (Signed)
Right sacroiliac injection under fluoroscopic guidance  Indication: Right Low back and buttocks pain not relieved by medication management and other conservative care.  Informed consent was obtained after describing risks and benefits of the procedure with the patient, this includes bleeding, bruising, infection, paralysis and medication side effects. The patient wishes to proceed and has given written consent. The patient was placed in a prone position. The lumbar and sacral area was marked and prepped with Betadine. A 25-gauge 1-1/2 inch needle was inserted into the skin and subcutaneous tissue and 1 mL of 1% lidocaine was injected. Then a 25-gauge 3 inch spinal needle was inserted under fluoroscopic guidance into the Right sacroiliac joint. AP and lateral images were utilized. Omnipaque 180x0.5 mL under live fluoroscopy demonstrated no intravascular uptake. Then a solution containing one ML of 40 mg per mL depomedrol and 2 ML of 1% lidocaine MPF was injected x1.5 mL. Patient tolerated the procedure well. Post procedure instructions were given. Please see post procedure form. 

## 2011-07-16 NOTE — Progress Notes (Signed)
  PROCEDURE RECORD [facility]   Name: Sue [name] DOB:08/29/1975 MRN: [id number]  Date:07/16/2011  Physician: Claudette Laws, MD    Nurse/CMA: Shumaker RN  Allergies:  Allergies  Allergen Reactions  . Sulfamethoxazole W-Trimethoprim     REACTION: septra--doesn't recall what type of reaction  . Amoxicillin Rash    Consent Signed: yes  Is patient diabetic? no  CBG today?   Pregnant: no LMP: No LMP recorded. (age 36-55)  Anticoagulants: no Anti-inflammatory: no Antibiotics: no  Procedure:Sacroiliac  Steroid Injection Position: Prone Start Time: 11:23  End Time: 11:29 Fluoro Time: 5 sec  RN/CMA Sue Shearman Shumaker RN    Time 10:46 am 11:34 am    BP 133/74 129/66    Pulse 79 85    Respirations 16 16    O2 Sat 99 100%    S/S 6 6    Pain Level 4 4     D/C home with Sue [name]-sons girlfriend, patient A & O X 3, D/C instructions reviewed, and sits independently.

## 2011-07-16 NOTE — Patient Instructions (Signed)
Please keep track of your pain. See how much of it goes away. A good result is 50% or more of your pain improving in the right buttocks area. I will see you again in one month will decide if we need to do a hip injection for another sacroiliac injection.

## 2011-07-17 ENCOUNTER — Telehealth: Payer: Self-pay | Admitting: Physical Medicine & Rehabilitation

## 2011-07-17 NOTE — Telephone Encounter (Signed)
Lm for pt to call office regarding her issue.

## 2011-07-17 NOTE — Telephone Encounter (Signed)
Had R SI inj yeasterday and today the L side feels terrible.  Please call.

## 2011-07-20 NOTE — Telephone Encounter (Signed)
Pt states that she is still having issues with her L side. Any suggestions?

## 2011-07-20 NOTE — Telephone Encounter (Signed)
She likely has had problems in both sacroiliac joints and now the left side has become more apparent. We could schedule her for a left-sided SI joint injection. In the meantime she can use ibuprofen or Aleve for pain

## 2011-07-22 NOTE — Telephone Encounter (Signed)
Left voicemail message for Sue [name] to [facility]

## 2011-07-22 NOTE — Telephone Encounter (Signed)
Has patient been contacted? 

## 2011-07-23 NOTE — Telephone Encounter (Signed)
Pt has been contacted 3 different times and has not called [place] back.

## 2011-08-02 ENCOUNTER — Emergency Department (HOSPITAL_COMMUNITY)
Admission: EM | Admit: 2011-08-02 | Discharge: 2011-08-02 | Disposition: A | Discharge status: Home / Self Care | Payer: Medicaid Other | Attending: Emergency Medicine | Admitting: Emergency Medicine

## 2011-08-02 ENCOUNTER — Emergency Department (HOSPITAL_COMMUNITY): Payer: Medicaid Other

## 2011-08-02 ENCOUNTER — Encounter (HOSPITAL_COMMUNITY): Payer: Self-pay | Admitting: *Deleted

## 2011-08-02 DIAGNOSIS — J45909 Unspecified asthma, uncomplicated: Secondary | ICD-10-CM | POA: Insufficient documentation

## 2011-08-02 DIAGNOSIS — Z79899 Other long term (current) drug therapy: Secondary | ICD-10-CM | POA: Insufficient documentation

## 2011-08-02 DIAGNOSIS — K5900 Constipation, unspecified: Secondary | ICD-10-CM | POA: Insufficient documentation

## 2011-08-02 DIAGNOSIS — N200 Calculus of kidney: Secondary | ICD-10-CM | POA: Insufficient documentation

## 2011-08-02 DIAGNOSIS — K219 Gastro-esophageal reflux disease without esophagitis: Secondary | ICD-10-CM | POA: Insufficient documentation

## 2011-08-02 DIAGNOSIS — M5430 Sciatica, unspecified side: Secondary | ICD-10-CM | POA: Insufficient documentation

## 2011-08-02 DIAGNOSIS — I10 Essential (primary) hypertension: Secondary | ICD-10-CM | POA: Insufficient documentation

## 2011-08-02 DIAGNOSIS — A6000 Herpesviral infection of urogenital system, unspecified: Secondary | ICD-10-CM | POA: Insufficient documentation

## 2011-08-02 LAB — DIFFERENTIAL
Basophils Absolute: 0 10*3/uL (ref 0.0–0.1)
Basophils Relative: 0 % (ref 0–1)
Eosinophils Absolute: 0.1 10*3/uL (ref 0.0–0.7)
Eosinophils Relative: 2 % (ref 0–5)
Lymphocytes Relative: 31 % (ref 12–46)
Lymphs Abs: 2.2 10*3/uL (ref 0.7–4.0)
Monocytes Absolute: 0.4 10*3/uL (ref 0.1–1.0)
Monocytes Relative: 6 % (ref 3–12)
Neutro Abs: 4.3 10*3/uL (ref 1.7–7.7)
Neutrophils Relative %: 61 % (ref 43–77)

## 2011-08-02 LAB — CBC
HCT: 39 % (ref 36.0–46.0)
Hemoglobin: 13.7 g/dL (ref 12.0–15.0)
MCH: 30.4 pg (ref 26.0–34.0)
MCHC: 35.1 g/dL (ref 30.0–36.0)
MCV: 86.7 fL (ref 78.0–100.0)
Platelets: 329 10*3/uL (ref 150–400)
RBC: 4.5 MIL/uL (ref 3.87–5.11)
RDW: 13.7 % (ref 11.5–15.5)
WBC: 7 10*3/uL (ref 4.0–10.5)

## 2011-08-02 LAB — URINALYSIS, ROUTINE W REFLEX MICROSCOPIC
Bilirubin Urine: NEGATIVE
Glucose, UA: NEGATIVE mg/dL
Ketones, ur: NEGATIVE mg/dL
Leukocytes, UA: NEGATIVE
Nitrite: NEGATIVE
Protein, ur: NEGATIVE mg/dL
Specific Gravity, Urine: 1.013 (ref 1.005–1.030)
Urobilinogen, UA: 1 mg/dL (ref 0.0–1.0)
pH: 6.5 (ref 5.0–8.0)

## 2011-08-02 LAB — POCT I-STAT, CHEM 8
BUN: 8 mg/dL (ref 6–23)
Calcium, Ion: 1.22 mmol/L (ref 1.12–1.32)
Chloride: 104 mEq/L (ref 96–112)
Creatinine, Ser: 1 mg/dL (ref 0.50–1.10)
Glucose, Bld: 89 mg/dL (ref 70–99)
HCT: 43 % (ref 36.0–46.0)
Hemoglobin: 14.6 g/dL (ref 12.0–15.0)
Potassium: 3.8 mEq/L (ref 3.5–5.1)
Sodium: 140 mEq/L (ref 135–145)
TCO2: 25 mmol/L (ref 0–100)

## 2011-08-02 LAB — URINE MICROSCOPIC-ADD ON

## 2011-08-02 LAB — POCT PREGNANCY, URINE: Preg Test, Ur: NEGATIVE

## 2011-08-02 MED ORDER — HYDROMORPHONE HCL PF 1 MG/ML IJ SOLN
1.0000 mg | Freq: Once | INTRAMUSCULAR | Status: AC
  Administered 2011-08-02: 1 mg via INTRAVENOUS
  Filled 2011-08-02: qty 1

## 2011-08-02 MED ORDER — OXYCODONE-ACETAMINOPHEN 5-325 MG PO TABS: 1.0000 | ORAL_TABLET | ORAL | Status: AC | PRN

## 2011-08-02 MED ORDER — ONDANSETRON HCL 4 MG PO TABS: 4.0000 mg | ORAL_TABLET | Freq: Four times a day (QID) | ORAL | Status: AC

## 2011-08-02 MED ORDER — SODIUM CHLORIDE 0.9 % IV BOLUS (SEPSIS)
500.0000 mL | Freq: Once | INTRAVENOUS | Status: AC
  Administered 2011-08-02: 500 mL via INTRAVENOUS

## 2011-08-02 MED ORDER — IBUPROFEN 800 MG PO TABS
800.0000 mg | ORAL_TABLET | Freq: Once | ORAL | Status: AC
  Administered 2011-08-02: 800 mg via ORAL
  Filled 2011-08-02: qty 1

## 2011-08-02 NOTE — Discharge Instructions (Signed)
[name] you have a non-obstructing kidney stone on the Left. You can follow up with Dr. Isabel [name] if you have any more problems of severe pain uncontrolled with pain meds.  You did have blood in your urine.  ? If you may have passed a stone but the pain would be gone if you passed the stone.  The scan also showed that you are constipated.  Drink plenty of water.  You can also try Milk of magnesia, or magnesium citrate.  Take metamucil daily. Take ibuprofen for pain.  Take the percocet for severe pain but do not drive with this medication.  Zofran is for nausea.    Constipation in Adults Constipation is having fewer than 2 bowel movements per week. Usually, the stools are hard. As we grow older, constipation is more common. If you try to fix constipation with laxatives, the problem may get worse. This is because laxatives taken over a long period of time make the colon muscles weaker. A low-fiber diet, not taking in enough fluids, and taking some medicines may make these problems worse. MEDICATIONS THAT MAY CAUSE CONSTIPATION  Water pills (diuretics).   Calcium channel blockers (used to control blood pressure and for the heart).   Certain pain medicines (narcotics).   Anticholinergics.   Anti-inflammatory agents.   Antacids that contain aluminum.  DISEASES THAT CONTRIBUTE TO CONSTIPATION  Diabetes.   Parkinson's disease.   Dementia.   Stroke.   Depression.   Illnesses that cause problems with salt and water metabolism.  HOME CARE INSTRUCTIONS   Constipation is usually best cared for without medicines. Increasing dietary fiber and eating more fruits and vegetables is the best way to manage constipation.   Slowly increase fiber intake to 25 to 38 grams per day. Whole grains, fruits, vegetables, and legumes are good sources of fiber. A dietitian can further help you incorporate high-fiber foods into your diet.   Drink enough water and fluids to keep your urine clear or pale yellow.    A fiber supplement may be added to your diet if you cannot get enough fiber from foods.   Increasing your activities also helps improve regularity.   Suppositories, as suggested by your caregiver, will also help. If you are using antacids, such as aluminum or calcium containing products, it will be helpful to switch to products containing magnesium if your caregiver says it is okay.   If you have been given a liquid injection (enema) today, this is only a temporary measure. It should not be relied on for treatment of longstanding (chronic) constipation.   Stronger measures, such as magnesium sulfate, should be avoided if possible. This may cause uncontrollable diarrhea. Using magnesium sulfate may not allow you time to make it to the bathroom.  SEEK IMMEDIATE MEDICAL CARE IF:   There is bright red blood in the stool.   The constipation stays for more than 4 days.   There is belly (abdominal) or rectal pain.   You do not seem to be getting better.   You have any questions or concerns.  MAKE SURE YOU:   Understand these instructions.   Will watch your condition.   Will get help right away if you are not doing well or get worse.  Document Released: 11/22/2003 Document Revised: 02/12/2011 Document Reviewed: 01/27/2011 [facility] Patient Information 2012 [place], [place].

## 2011-08-02 NOTE — ED Notes (Signed)
Pt.has c/o sharp burning pain in her left flan, lower back, and abdomen. PT. has c/o burning when voiding.  PT. report s that this pain has been going on since Friday. Pt. Denies any injury.  Pt. Has c/o pain when the area is touched.  Pt. deneis n/v/d, or sob.

## 2011-08-03 NOTE — ED Provider Notes (Signed)
History     CSN: [id number]  Arrival date & time 08/02/11  1009   First MD Initiated Contact with Patient 08/02/11 1025      Chief Complaint  Patient presents with  . Abdominal Pain  . Back Pain  . Dysuria    (Consider location/radiation/quality/duration/timing/severity/associated sxs/prior treatment) Patient is a 36 y.o. female presenting with flank pain. The history is provided by the patient. No language interpreter was used.  Flank Pain This is a new problem. The current episode started in the past 7 days. The problem occurs constantly. The problem has been gradually worsening. Associated symptoms include abdominal pain and nausea. Pertinent negatives include no chest pain, congestion, coughing, fever or vomiting. Associated symptoms comments: L cva tenderness/ suprapubic pain. Exacerbated by: laying down. She has tried walking for the symptoms. The treatment provided mild relief.  Intermittant L flank pain to LLQ x 3 days.  Suprapubic pain x 1 day.  Denies dysuria,fever, frequency, vomiting, vaginal discharge or hematuria.  pmh below.    Past Medical History  Diagnosis Date  . Asthma     mild by PFT's 03/16/1998  . Sciatica 01/12/06  . History of abnormal Pap smear     s/p cryo( Dr. Wiliam Ke)  . Cervical lymphadenopathy     biopsy negativeMarvetta Gibbons)  . Normal vaginal delivery     x3  . Complete spontaneous abortion     x2  . Hypertension   . HSV (herpes simplex virus) anogenital infection   . GERD (gastroesophageal reflux disease)   . 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     Past Surgical History  Procedure Date  . Biopsy of lymph node in r neck 2007.    Family History  Problem Relation Age of Onset  . Ulcerative colitis Mother   . Hypertension Mother   . Diabetes Father   . Heart attack Father   . Hypertension  Father   . Diabetes Sister   . Heart attack Maternal Grandmother     History  Substance Use Topics  . Smoking status: Never Smoker   . Smokeless tobacco: Not on file  . Alcohol Use: Yes     occasional beer on weekends    OB History    Grav Para Term Preterm Abortions TAB SAB Ect Mult Living                  Review of Systems  Constitutional: Negative.  Negative for fever.  HENT: Negative.  Negative for congestion.   Eyes: Negative.   Respiratory: Negative.  Negative for cough and shortness of breath.   Cardiovascular: Negative.  Negative for chest pain.  Gastrointestinal: Positive for nausea and abdominal pain. Negative for vomiting and blood in stool.  Genitourinary: Positive for flank pain. Negative for dysuria, frequency, hematuria, vaginal bleeding, vaginal discharge, difficulty urinating and pelvic pain.  Neurological: Negative.   Psychiatric/Behavioral: Negative.   All other systems reviewed and are negative.    Allergies  Sulfamethoxazole w-trimethoprim and Amoxicillin  Home Medications   Current Outpatient Rx  Name Route Sig Dispense Refill  . ALBUTEROL 90 MCG/ACT IN AERS Inhalation Inhale 2 puffs into the lungs every 6 (six) hours as needed. 1 each 5  . AMLODIPINE BESYLATE 2.5 MG PO TABS  take 1 tablet by mouth once daily 90 tablet 1  . CYCLOBENZAPRINE HCL 10 MG  PO TABS Oral Take 1 tablet (10 mg total) by mouth every 8 (eight) hours as needed for muscle spasms. 30 tablet 1  . LEVONORGEST-ETH ESTRAD 91-DAY 0.15-0.03 MG PO TABS Oral Take 1 tablet by mouth daily.      Marland Kitchen MECLIZINE HCL 25 MG PO TABS Oral Take 1 tablet (25 mg total) by mouth 3 (three) times daily as needed for dizziness or nausea. 30 tablet 1  . MESALAMINE 1000 MG RE SUPP Rectal Place 1,000 mg rectally 2 (two) times a week.     . ADULT MULTIVITAMIN W/MINERALS CH Oral Take 1 tablet by mouth daily.    Marland Kitchen NAPROXEN 375 MG PO TABS Oral Take 375 mg by mouth 2 (two) times daily as needed. pain    .  OMEPRAZOLE 20 MG PO CPDR Oral Take 20 mg by mouth daily as needed. Acid reflux    . VALACYCLOVIR HCL 500 MG PO TABS Oral Take 500 mg by mouth daily as needed. outbreak    . BECLOMETHASONE DIPROPIONATE 40 MCG/ACT IN AERS Inhalation Inhale 2 puffs into the lungs 2 (two) times daily as needed. Shortness of breath    . FLUTICASONE PROPIONATE 50 MCG/ACT NA SUSP Nasal Place 2 sprays into the nose daily as needed. allergies    . ONDANSETRON HCL 4 MG PO TABS Oral Take 1 tablet (4 mg total) by mouth every 6 (six) hours. 12 tablet 0  . OXYCODONE-ACETAMINOPHEN 5-325 MG PO TABS Oral Take 1 tablet by mouth every 4 (four) hours as needed for pain (1 or 2 every 4 hours). 10 tablet 0    BP 108/70  Pulse 78  Temp(Src) 98.5 F (36.9 C) (Oral)  Resp 18  SpO2 99%  LMP 07/12/2011  Physical Exam  Nursing note and vitals reviewed. Constitutional: She is oriented to person, place, and time. She appears well-developed and well-nourished.  HENT:  Head: Normocephalic and atraumatic.  Eyes: Conjunctivae and EOM are normal. Pupils are equal, round, and reactive to light.  Neck: Normal range of motion. Neck supple.  Cardiovascular: Normal rate.   Pulmonary/Chest: Effort normal.  Abdominal: Soft.       L flank pain/llq pain. Suprapubic pain  Musculoskeletal: Normal range of motion. She exhibits no edema and no tenderness.  Neurological: She is alert and oriented to person, place, and time. She has normal reflexes.  Skin: Skin is warm and dry.  Psychiatric: She has a normal mood and affect.    ED Course  Procedures (including critical care time)  Labs Reviewed  URINALYSIS, ROUTINE W REFLEX MICROSCOPIC - Abnormal; Notable for the following:    Hgb urine dipstick SMALL (*)    All other components within normal limits  URINE MICROSCOPIC-ADD ON - Abnormal; Notable for the following:    Squamous Epithelial / LPF MANY (*)    All other components within normal limits  CBC  DIFFERENTIAL  POCT PREGNANCY, URINE    POCT I-STAT, CHEM 8  LAB REPORT - SCANNED   Ct Abdomen Pelvis Wo Contrast  08/02/2011  *RADIOLOGY REPORT*  Clinical Data: Burning pain in left flank, lower back, and abdomen.  CT ABDOMEN AND PELVIS WITHOUT CONTRAST  Technique:  Multidetector CT imaging of the abdomen and pelvis was performed following the standard protocol without intravenous contrast.  Comparison: CT abdomen pelvis 04/01/2010  Findings: The imaged lung bases are clear.  There is a nonobstructing 4 mm stone in the upper pole of the left kidney.  There are no stones in the right kidney.  Both ureters are normal in caliber.  No ureteral stone is identified.  Stable calcification in the lower left pelvis, likely a phlebolith.  There is no perinephric fluid or stranding.  The noncontrast appearance of the liver, gallbladder, spleen, adrenal glands, and pancreas is within normal limits.  The appendix is normal.  There is a moderate amount of stool throughout the proximal colon.  Bowel loops are normal in caliber and wall thickness.  Retroverted uterus appears stable.  Urinary bladder within normal limits.  No evidence of lymphadenopathy or ascites.  No adnexal mass identified.  Inguinal regions unremarkable.  No acute or suspicious bony abnormality.  IMPRESSION:  1.  Negative for urinary tract obstruction. 2.  4 mm nonobstructing left upper pole renal stone. 3.  Normal appendix. 4.  Moderate amount of stool in the proximal colon.  Original Report Authenticated By: Britta Mccreedy, M.D.     1. Constipation   2. Stone in kidney       MDM  Nonopbstructing stone in the left upper pole.  Hematuria.  Will follow up with urology.  rx for pain meds and zofran.  Return if worse.        Remi Haggard, NP 08/03/11 2117

## 2011-08-04 ENCOUNTER — Telehealth: Payer: Self-pay | Admitting: *Deleted

## 2011-08-04 NOTE — Telephone Encounter (Signed)
Pt called with c/o pain from kidney stone on left side, blood in urine and ulcerative proctitis causing blood in stool.   She was seen in ED on 5/26, was given pain meds and was told to go home and wait for stone to pass. She is to call Dr Isabel Caprice if pain continues.  Return call to pt, she is at work.  She is c/o pain to left flank area.  She has not called Dr Isabel Caprice and was not sure who he was.  I gave her his # and told her she was referred to him because of pain from kidney stone.  Directions were from ED MD. She will call him.   Pt # [id number]

## 2011-08-04 NOTE — Telephone Encounter (Signed)
Pt called Dr Ellin Goodie office and they need a referral from [place].  Pt # [id number]  ( or after 2 [id number] )

## 2011-08-04 NOTE — Telephone Encounter (Signed)
Referral was made over the phone.   Dr Ellin Goodie office will call pt with appointment.

## 2011-08-04 NOTE — Telephone Encounter (Signed)
We should refer her to Dr. Isabel Caprice as requested by ER.  There is no point to having her come here for this purpose.

## 2011-08-06 NOTE — ED Provider Notes (Signed)
Medical screening examination/treatment/procedure(s) were performed by non-physician practitioner and as supervising physician I was immediately available for consultation/collaboration.   Suzi Roots, MD 08/06/11 1017

## 2011-08-14 ENCOUNTER — Other Ambulatory Visit: Payer: Self-pay | Admitting: *Deleted

## 2011-08-14 MED ORDER — ALBUTEROL SULFATE HFA 108 (90 BASE) MCG/ACT IN AERS: 2.0000 | INHALATION_SPRAY | Freq: Four times a day (QID) | RESPIRATORY_TRACT | Status: DC | PRN

## 2011-08-14 NOTE — Telephone Encounter (Signed)
Need to place new order for Ventolin HFA inhaler - needs refill RA / RR.

## 2011-08-20 ENCOUNTER — Encounter: Payer: Medicaid Other | Attending: Physical Medicine & Rehabilitation

## 2011-08-20 ENCOUNTER — Encounter: Payer: Self-pay | Admitting: Physical Medicine & Rehabilitation

## 2011-08-20 ENCOUNTER — Ambulatory Visit (HOSPITAL_BASED_OUTPATIENT_CLINIC_OR_DEPARTMENT_OTHER): Payer: Medicaid Other | Admitting: Physical Medicine & Rehabilitation

## 2011-08-20 VITALS — BP 121/54 | HR 70 | Resp 16 | Ht 63.0 in | Wt 150.0 lb

## 2011-08-20 DIAGNOSIS — M533 Sacrococcygeal disorders, not elsewhere classified: Secondary | ICD-10-CM

## 2011-08-20 DIAGNOSIS — M545 Low back pain, unspecified: Secondary | ICD-10-CM | POA: Insufficient documentation

## 2011-08-20 DIAGNOSIS — M25559 Pain in unspecified hip: Secondary | ICD-10-CM | POA: Insufficient documentation

## 2011-08-20 DIAGNOSIS — R209 Unspecified disturbances of skin sensation: Secondary | ICD-10-CM | POA: Insufficient documentation

## 2011-08-20 DIAGNOSIS — G8929 Other chronic pain: Secondary | ICD-10-CM | POA: Insufficient documentation

## 2011-08-20 DIAGNOSIS — M129 Arthropathy, unspecified: Secondary | ICD-10-CM | POA: Insufficient documentation

## 2011-08-20 NOTE — Progress Notes (Signed)
  PROCEDURE RECORD [facility]   Name: DAYSHIA BALLINAS DOB:04-12-1975 MRN: [id number]  Date:08/20/2011  Physician: Claudette Laws, MD    Nurse/CMA: Redgie Grayer  Allergies:  Allergies  Allergen Reactions  . Sulfamethoxazole W-Trimethoprim     REACTION: septra--doesn't recall what type of reaction  . Amoxicillin Rash    Consent Signed: yes  Is patient diabetic? no  CBG   Pregnant: no LMP: Patient's last menstrual period was 07/12/2011. (age 20-55)  Anticoagulants: no Anti-inflammatory: yes (Naproxen) Antibiotics: no  Procedure: SI Injection  Position: Prone Start Time: 1:44pm  End Time: 1:46pm  Fluoro Time: 7 seconds  RN/CMA Hampton Abbot    Time 1:14pm 1:49pm    BP 121/54 132/73    Pulse 70 75    Respirations 16 16    O2 Sat 97% 99%    S/S 6 6    Pain Level 2/10 2/10     D/C home with Sister, patient A & O X 3, D/C instructions reviewed, and sits independently.

## 2011-08-20 NOTE — Progress Notes (Signed)
Informed consent was obtained after describing risks and benefits of the procedure with the patient, this includes bleeding, bruising, infection, paralysis and medication side effects. The patient wishes to proceed and has given written consent. The patient was placed in a prone position. The lumbar and sacral area was marked and prepped with Betadine. A 25-gauge 1-1/2 inch needle was inserted into the skin and subcutaneous tissue and 1 mL of 1% lidocaine was injected. Then a 25-gauge 3 inch spinal needle was inserted under fluoroscopic guidance into the Left sacroiliac joint. AP and lateral images were utilized. Omnipaque 180x0.5 mL under live fluoroscopy demonstrated no intravascular uptake. Then a solution containing one ML of 40 mg per mL depomedrol and 2 ML of 1% lidocaine MPF was injected x1.5 mL. Patient tolerated the procedure well. Post procedure instructions were given. Please see post procedure form.

## 2011-08-20 NOTE — Patient Instructions (Signed)
Keep track of your pain. I expect this injection to wear off over time however we can repeat either the right side or left side or both in the future when your pain returns.

## 2011-10-08 ENCOUNTER — Telehealth: Payer: Self-pay | Admitting: Physical Medicine & Rehabilitation

## 2011-10-08 NOTE — Telephone Encounter (Signed)
Patient was told to call when pain returned after SI inj.  Having pain again on R side.  Been taking Ibuprofen, Naproxin, etc.  Another inj?

## 2011-10-08 NOTE — Telephone Encounter (Signed)
Lm advising pt to call and schedule appt for another injection.

## 2011-10-29 ENCOUNTER — Encounter (HOSPITAL_COMMUNITY): Payer: Self-pay | Admitting: *Deleted

## 2011-10-29 ENCOUNTER — Emergency Department (HOSPITAL_COMMUNITY)
Admission: EM | Admit: 2011-10-29 | Discharge: 2011-10-29 | Disposition: A | Discharge status: Home / Self Care | Payer: Medicaid Other | Attending: Emergency Medicine | Admitting: Emergency Medicine

## 2011-10-29 DIAGNOSIS — R109 Unspecified abdominal pain: Secondary | ICD-10-CM

## 2011-10-29 DIAGNOSIS — K219 Gastro-esophageal reflux disease without esophagitis: Secondary | ICD-10-CM | POA: Insufficient documentation

## 2011-10-29 DIAGNOSIS — J45909 Unspecified asthma, uncomplicated: Secondary | ICD-10-CM | POA: Insufficient documentation

## 2011-10-29 DIAGNOSIS — I10 Essential (primary) hypertension: Secondary | ICD-10-CM | POA: Insufficient documentation

## 2011-10-29 LAB — URINE MICROSCOPIC-ADD ON

## 2011-10-29 LAB — CBC WITH DIFFERENTIAL/PLATELET
Basophils Absolute: 0 10*3/uL (ref 0.0–0.1)
Basophils Relative: 0 % (ref 0–1)
Eosinophils Absolute: 0.2 10*3/uL (ref 0.0–0.7)
Eosinophils Relative: 2 % (ref 0–5)
HCT: 38 % (ref 36.0–46.0)
Hemoglobin: 13.2 g/dL (ref 12.0–15.0)
Lymphocytes Relative: 18 % (ref 12–46)
Lymphs Abs: 1.3 10*3/uL (ref 0.7–4.0)
MCH: 29.9 pg (ref 26.0–34.0)
MCHC: 34.7 g/dL (ref 30.0–36.0)
MCV: 86.2 fL (ref 78.0–100.0)
Monocytes Absolute: 0.4 10*3/uL (ref 0.1–1.0)
Monocytes Relative: 5 % (ref 3–12)
Neutro Abs: 5.6 10*3/uL (ref 1.7–7.7)
Neutrophils Relative %: 75 % (ref 43–77)
Platelets: 296 10*3/uL (ref 150–400)
RBC: 4.41 MIL/uL (ref 3.87–5.11)
RDW: 13.6 % (ref 11.5–15.5)
WBC: 7.5 10*3/uL (ref 4.0–10.5)

## 2011-10-29 LAB — COMPREHENSIVE METABOLIC PANEL
ALT: 17 U/L (ref 0–35)
AST: 16 U/L (ref 0–37)
Albumin: 3.2 g/dL — ABNORMAL LOW (ref 3.5–5.2)
Alkaline Phosphatase: 84 U/L (ref 39–117)
BUN: 9 mg/dL (ref 6–23)
CO2: 25 mEq/L (ref 19–32)
Calcium: 9.1 mg/dL (ref 8.4–10.5)
Chloride: 103 mEq/L (ref 96–112)
Creatinine, Ser: 0.97 mg/dL (ref 0.50–1.10)
GFR calc Af Amer: 87 mL/min — ABNORMAL LOW (ref >90)
GFR calc non Af Amer: 75 mL/min — ABNORMAL LOW (ref >90)
Glucose, Bld: 90 mg/dL (ref 70–99)
Potassium: 3.7 mEq/L (ref 3.5–5.1)
Sodium: 138 mEq/L (ref 135–145)
Total Bilirubin: 0.3 mg/dL (ref 0.3–1.2)
Total Protein: 7.4 g/dL (ref 6.0–8.3)

## 2011-10-29 LAB — URINALYSIS, ROUTINE W REFLEX MICROSCOPIC
Bilirubin Urine: NEGATIVE
Glucose, UA: NEGATIVE mg/dL
Ketones, ur: NEGATIVE mg/dL
Leukocytes, UA: NEGATIVE
Nitrite: NEGATIVE
Protein, ur: NEGATIVE mg/dL
Specific Gravity, Urine: 1.016 (ref 1.005–1.030)
Urobilinogen, UA: 1 mg/dL (ref 0.0–1.0)
pH: 7 (ref 5.0–8.0)

## 2011-10-29 LAB — WET PREP, GENITAL
Trich, Wet Prep: NONE SEEN
WBC, Wet Prep HPF POC: NONE SEEN
Yeast Wet Prep HPF POC: NONE SEEN

## 2011-10-29 LAB — POCT PREGNANCY, URINE: Preg Test, Ur: NEGATIVE

## 2011-10-29 LAB — OCCULT BLOOD, POC DEVICE: Fecal Occult Bld: NEGATIVE

## 2011-10-29 MED ORDER — MESALAMINE 4 G RE ENEM: 4.0000 g | ENEMA | Freq: Every day | RECTAL | Status: DC

## 2011-10-29 MED ORDER — ONDANSETRON HCL 4 MG/2ML IJ SOLN
4.0000 mg | Freq: Once | INTRAMUSCULAR | Status: AC
  Administered 2011-10-29: 4 mg via INTRAVENOUS
  Filled 2011-10-29: qty 2

## 2011-10-29 MED ORDER — MORPHINE SULFATE 4 MG/ML IJ SOLN
4.0000 mg | Freq: Once | INTRAMUSCULAR | Status: AC
  Administered 2011-10-29: 4 mg via INTRAVENOUS
  Filled 2011-10-29: qty 1

## 2011-10-29 MED ORDER — HYDROCODONE-ACETAMINOPHEN 5-500 MG PO TABS: 1.0000 | ORAL_TABLET | Freq: Four times a day (QID) | ORAL | Status: AC | PRN

## 2011-10-29 NOTE — ED Notes (Signed)
Pt set up for pelvic exam.

## 2011-10-29 NOTE — ED Provider Notes (Signed)
History     CSN: [id number]  Arrival date & time 10/29/11  0801   First MD Initiated Contact with Patient 10/29/11 [phone]      Chief Complaint  Patient presents with  . Abdominal Pain    (Consider location/radiation/quality/duration/timing/severity/associated sxs/prior treatment) Patient is a 36 y.o. female presenting with abdominal pain.  Abdominal Pain The primary symptoms of the illness include abdominal pain, nausea, diarrhea (2 days ago) and hematochezia. The primary symptoms of the illness do not include fever, shortness of breath, vomiting, hematemesis, dysuria, vaginal discharge or vaginal bleeding. The current episode started 2 days ago. The onset of the illness was sudden. The problem has been gradually worsening.  The abdominal pain is located in the LLQ. The abdominal pain does not radiate. The severity of the abdominal pain is 10/10. The abdominal pain is relieved by nothing.  Nausea began 2 days ago.  The diarrhea began 2 days ago.  The hematochezia began today. The hematochezia has occurred 1 time per day.  Additional symptoms associated with the illness include chills and urgency. Symptoms associated with the illness do not include constipation or back pain. Significant associated medical issues include inflammatory bowel disease.    Past Medical History  Diagnosis Date  . Asthma     mild by PFT's 03/16/1998  . Sciatica 01/12/06  . History of abnormal Pap smear     s/p cryo( Dr. Wiliam Ke)  . Cervical lymphadenopathy     biopsy negativeMarvetta Gibbons)  . Normal vaginal delivery     x3  . Complete spontaneous abortion     x2  . Hypertension   . HSV (herpes simplex virus) anogenital infection   . GERD (gastroesophageal reflux disease)   . 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     Past Surgical History  Procedure Date    . Biopsy of lymph node in r neck 2007.    Family History  Problem Relation Age of Onset  . Ulcerative colitis Mother   . Hypertension Mother   . Diabetes Father   . Heart attack Father   . Hypertension Father   . Diabetes Sister   . Heart attack Maternal Grandmother     History  Substance Use Topics  . Smoking status: Never Smoker   . Smokeless tobacco: Not on file  . Alcohol Use: Yes     occasional beer and liquor on weekends    OB History    Grav Para Term Preterm Abortions TAB SAB Ect Mult Living                  Review of Systems  Constitutional: Positive for chills. Negative for fever.  Respiratory: Negative for shortness of breath.   Cardiovascular: Negative for chest pain and leg swelling.  Gastrointestinal: Positive for nausea, abdominal pain, diarrhea (2 days ago), blood in stool (this am) and hematochezia. Negative for vomiting, constipation and hematemesis.  Genitourinary: Positive for urgency. Negative for dysuria, decreased urine volume, vaginal bleeding, vaginal discharge and menstrual problem.  Musculoskeletal: Negative for back pain.  Neurological: Negative for headaches.  All other systems reviewed and are negative.    Allergies  Sulfamethoxazole w-trimethoprim and Amoxicillin  Home Medications   Current Outpatient Rx  Name Route Sig Dispense Refill  . AMLODIPINE BESYLATE 2.5 MG PO TABS Oral Take 2.5 mg by mouth daily.    Marland Kitchen  CYCLOBENZAPRINE HCL 10 MG PO TABS Oral Take 1 tablet (10 mg total) by mouth every 8 (eight) hours as needed for muscle spasms. 30 tablet 1  . FLUTICASONE PROPIONATE 50 MCG/ACT NA SUSP Nasal Place 2 sprays into the nose daily as needed. For allergies.    Marland Kitchen LEVONORGEST-ETH ESTRAD 91-DAY 0.15-0.03 MG PO TABS Oral Take 1 tablet by mouth daily.      Marland Kitchen MECLIZINE HCL 25 MG PO TABS Oral Take 1 tablet (25 mg total) by mouth 3 (three) times daily as needed for dizziness or nausea. 30 tablet 1  . MESALAMINE 1000 MG RE SUPP Rectal Place  1,000 mg rectally 2 (two) times a week. Insert on Tuesdays and Thursdays.    . ADULT MULTIVITAMIN W/MINERALS CH Oral Take 1 tablet by mouth daily.    Marland Kitchen VALACYCLOVIR HCL 500 MG PO TABS Oral Take 500 mg by mouth daily as needed. For outbreaks.    . ALBUTEROL SULFATE HFA 108 (90 BASE) MCG/ACT IN AERS Inhalation Inhale 2 puffs into the lungs every 6 (six) hours as needed for wheezing. 1 Inhaler 3    BP 120/76  Pulse 81  Temp 98.2 F (36.8 C) (Oral)  Resp 14  Ht 5\' 3"  (1.6 m)  Wt 155 lb (70.308 kg)  BMI 27.46 kg/m2  SpO2 100%  LMP 10/10/2011  Physical Exam  Nursing note and vitals reviewed. Constitutional: She is oriented to person, place, and time. She appears well-developed and well-nourished. No distress.  HENT:  Head: Normocephalic and atraumatic.  Right Ear: External ear normal.  Left Ear: External ear normal.  Nose: Nose normal.  Mouth/Throat: Oropharynx is clear and moist.  Eyes: Right eye exhibits no discharge. Left eye exhibits no discharge.  Neck: Neck supple.  Cardiovascular: Normal rate, regular rhythm, normal heart sounds and intact distal pulses.   Pulmonary/Chest: Effort normal and breath sounds normal. No respiratory distress. She has no wheezes. She has no rales.  Abdominal: Soft. She exhibits no distension. There is tenderness in the left lower quadrant. There is no rigidity, no rebound and no guarding.  Genitourinary: Rectal exam shows no external hemorrhoid, no internal hemorrhoid and no tenderness. Uterus is not tender. Cervix exhibits no motion tenderness, no discharge and no friability. Right adnexum displays no tenderness. Left adnexum displays no tenderness. No tenderness around the vagina. Vaginal discharge (scant, clear) found.  Musculoskeletal: She exhibits no edema.  Neurological: She is alert and oriented to person, place, and time.  Skin: Skin is warm and dry. She is not diaphoretic. No pallor.    ED Course  Procedures (including critical care  time)  Labs Reviewed  COMPREHENSIVE METABOLIC PANEL - Abnormal; Notable for the following:    Albumin 3.2 (*)     GFR calc non Af Amer 75 (*)     GFR calc Af Amer 87 (*)     All other components within normal limits  URINALYSIS, ROUTINE W REFLEX MICROSCOPIC - Abnormal; Notable for the following:    APPearance HAZY (*)     Hgb urine dipstick LARGE (*)     All other components within normal limits  WET PREP, GENITAL - Abnormal; Notable for the following:    Clue Cells Wet Prep HPF POC FEW (*)     All other components within normal limits  CBC WITH DIFFERENTIAL  OCCULT BLOOD, POC DEVICE  URINE MICROSCOPIC-ADD ON  POCT PREGNANCY, URINE  GC/CHLAMYDIA PROBE AMP, GENITAL   No results found.   1. Abdominal pain  MDM  Pelvic exam negative. Mild tenderness, soft, no rebound. Benign exam on repeat examinations. No white count, fevers or vomiting. Doubt abscess or diverticulitis. Discussed with her GI Dr. Matthias Hughs, who will see in office in a few days to set up flex-sig, and recommends meslamine enemas.         Pricilla Loveless, MD 10/29/11 [phone]

## 2011-10-29 NOTE — ED Notes (Signed)
Pt c/o abdominal pain to left side of abdomen, blood in stool (that started this am), and also nausea.

## 2011-10-30 LAB — GC/CHLAMYDIA PROBE AMP, GENITAL
Chlamydia, DNA Probe: NEGATIVE
GC Probe Amp, Genital: NEGATIVE

## 2011-10-31 NOTE — ED Provider Notes (Signed)
Pt with chronic abd pain, has established with GI.  Has had multiple CT's in the past.  Unimpressive abd exam, complaints of bloody stools and pain.  No fever here.  Dr. Criss Alvine discussed with Dr. Clent Ridges who will re-eval as outpt in a few days.    Gavin Pound. Oletta Lamas, MD 10/31/11 [phone]

## 2011-11-06 ENCOUNTER — Other Ambulatory Visit: Payer: Self-pay | Admitting: Gastroenterology

## 2011-11-10 ENCOUNTER — Telehealth: Payer: Self-pay | Admitting: *Deleted

## 2011-11-10 NOTE — Telephone Encounter (Signed)
Left VM for patient to call the office and schedule the sacroiliac injection per Dr Wynn Banker last note.

## 2011-11-10 NOTE — Telephone Encounter (Signed)
Wants SI injection. Please call.

## 2011-11-24 ENCOUNTER — Encounter: Payer: Self-pay | Admitting: Physical Medicine & Rehabilitation

## 2011-11-24 ENCOUNTER — Ambulatory Visit (HOSPITAL_BASED_OUTPATIENT_CLINIC_OR_DEPARTMENT_OTHER): Payer: Medicaid Other | Admitting: Physical Medicine & Rehabilitation

## 2011-11-24 ENCOUNTER — Encounter: Payer: Medicaid Other | Attending: Physical Medicine & Rehabilitation

## 2011-11-24 VITALS — BP 121/68 | HR 80 | Resp 14 | Ht 63.0 in | Wt 156.4 lb

## 2011-11-24 DIAGNOSIS — G8929 Other chronic pain: Secondary | ICD-10-CM | POA: Insufficient documentation

## 2011-11-24 DIAGNOSIS — M129 Arthropathy, unspecified: Secondary | ICD-10-CM | POA: Insufficient documentation

## 2011-11-24 DIAGNOSIS — M533 Sacrococcygeal disorders, not elsewhere classified: Secondary | ICD-10-CM

## 2011-11-24 DIAGNOSIS — M545 Low back pain, unspecified: Secondary | ICD-10-CM | POA: Insufficient documentation

## 2011-11-24 DIAGNOSIS — M25559 Pain in unspecified hip: Secondary | ICD-10-CM | POA: Insufficient documentation

## 2011-11-24 DIAGNOSIS — R209 Unspecified disturbances of skin sensation: Secondary | ICD-10-CM | POA: Insufficient documentation

## 2011-11-24 NOTE — Patient Instructions (Signed)
We will do another sacroiliac injection in 3 months.

## 2011-11-24 NOTE — Progress Notes (Signed)
  PROCEDURE RECORD [facility]   Name: KANI JOBSON DOB:10-15-1975 MRN: [id number]  Date:11/24/2011  Physician: Claudette Laws, MD    Nurse/CMA: Shumaker RN  Allergies:  Allergies  Allergen Reactions  . Sulfamethoxazole W-Trimethoprim     REACTION: septra--doesn't recall what type of reaction  . Amoxicillin Rash    Consent Signed: yes  Is patient diabetic? no  CBG today?   Pregnant: no LMP: Patient's last menstrual period was 10/10/2011. (age 85-55) every 3 months per pt  Anticoagulants: no Anti-inflammatory: no Antibiotics: no  Procedure: Bilateral Sacroiliac Steroid Injection Position: Prone Start Time: 3:10 End Time:  Fluoro Time:   RN/CMA Designer, multimedia    Time 2:37     BP 121/66     Pulse 80     Respirations 14     O2 Sat 100     S/S 6 6    Pain Level 6/10      D/C home with Tiffany, patient A & O X 3, D/C instructions reviewed, and sits independently.

## 2011-11-24 NOTE — Progress Notes (Signed)
  Subjective:    Patient ID: Sue [name], female    DOB: Feb 28, 1976, 36 y.o.   MRN: [id number]  HPI    Review of Systems     Objective:   Physical Exam        Assessment & Plan:  Bilateral sacroiliac injections under fluoroscopic guidance  Indication: Low back and buttocks pain not relieved by medication management and other conservative care.  Informed consent was obtained after describing risks and benefits of the procedure with the patient, this includes bleeding, bruising, infection, paralysis and medication side effects. The patient wishes to proceed and has given written consent. The patient was placed in a prone position. The lumbar and sacral area was marked and prepped with Betadine. A 25-gauge 1-1/2 inch needle was inserted into the skin and subcutaneous tissue and 1 mL of 1% lidocaine was injected into each side. Then a 25-gauge 3 inch spinal needle was inserted under fluoroscopic guidance into the left sacroiliac joint. AP and lateral images were utilized. Omnipaque 180x0.5 mL under live fluoroscopy demonstrated no intravascular uptake. Then a solution containing one ML of 40 mg per mL Depakote met drawl in 2 ML of 2% lidocaine MPF was injected x1.5 mL. This same procedure was repeated on the right side using the same needle, injectate, and technique. Patient tolerated the procedure well. Post procedure instructions were given. Please see post procedure form.

## 2011-12-22 ENCOUNTER — Ambulatory Visit (INDEPENDENT_AMBULATORY_CARE_PROVIDER_SITE_OTHER): Payer: Medicaid Other

## 2011-12-22 DIAGNOSIS — Z23 Encounter for immunization: Secondary | ICD-10-CM

## 2012-01-11 ENCOUNTER — Ambulatory Visit (INDEPENDENT_AMBULATORY_CARE_PROVIDER_SITE_OTHER): Payer: Medicaid Other | Admitting: Internal Medicine

## 2012-01-11 VITALS — BP 113/76 | HR 95 | Temp 98.6°F | Ht 63.0 in | Wt 155.5 lb

## 2012-01-11 DIAGNOSIS — I10 Essential (primary) hypertension: Secondary | ICD-10-CM

## 2012-01-11 DIAGNOSIS — R8789 Other abnormal findings in specimens from female genital organs: Secondary | ICD-10-CM

## 2012-01-11 DIAGNOSIS — J029 Acute pharyngitis, unspecified: Secondary | ICD-10-CM

## 2012-01-11 LAB — RAPID STREP SCREEN (MED CTR MEBANE ONLY): Streptococcus, Group A Screen (Direct): NEGATIVE

## 2012-01-11 NOTE — Progress Notes (Signed)
  Subjective:    Patient ID: Sue [name], female    DOB: 25-Mar-1975, 36 y.o.   MRN: [id number]  HPI: 36 year old woman with past medical history significant for ulcerative proctitis, hypertension comes to the clinic for for sore throat for last 3 days.  Patient reports that friday night( 3 days prior to clinic visit), she started to have bad cough- was dry cough to begin with and now is bringing up whitish phlegm. She started having sore throat  on Satrurday and lost her voice yesterday. She also reports chills, but denies fever. She also reports having difficulty swallowing. Reports some sneezing but denies runny nose or watery eyes.   Denies sick contacts in the family.  Review of Systems  Constitutional: Positive for chills. Negative for fever and appetite change.  HENT: Negative for congestion, sneezing and postnasal drip.   Respiratory: Positive for cough. Negative for chest tightness and shortness of breath.   Cardiovascular: Negative for chest pain and leg swelling.  Gastrointestinal: Negative for abdominal pain.  Neurological: Negative for dizziness and headaches.       Objective:   Physical Exam  Constitutional: She is oriented to person, place, and time. She appears well-developed and well-nourished. No distress.  HENT:  Head: Normocephalic and atraumatic.       pharyngeal erythema noted. No exudates. Right TM intact, no fluid noticed in the middle ear.  Left TM intact, no fluid/effusion noticed in the middle ear. No lymphadenopathy  Eyes: Conjunctivae normal and EOM are normal. Pupils are equal, round, and reactive to light.  Neck: Normal range of motion. Neck supple. No JVD present. No tracheal deviation present. No thyromegaly present.  Cardiovascular: Normal rate and regular rhythm.   Pulmonary/Chest: Effort normal and breath sounds normal. No stridor. No respiratory distress. She has no wheezes.  Abdominal: Soft. Bowel sounds are normal. She exhibits no distension.  There is no tenderness. There is no rebound.  Musculoskeletal: Normal range of motion. She exhibits no edema and no tenderness.  Neurological: She is alert and oriented to person, place, and time. She has normal reflexes. No cranial nerve deficit. Coordination normal.  Skin: Skin is warm. She is not diaphoretic.          Assessment & Plan:

## 2012-01-11 NOTE — Assessment & Plan Note (Signed)
Patient presents with an episode of sore throat associated with difficulty swallowing and hoarseness voice for last 3 days. On exam she was noted to have pharyngeal erythema without any pharyngeal exudates. She did not have any evidence of lymph adenopathy. I think her sore throat is most likely viral in origin. Group A streptococcus seems to be very less likely , but I will obtain throat cultures to rule out group A strep as the cause for her sore throat to determine if she needs any antibiotics. -Plenty of fluids -Lozenges -Warm saline gargles -Call the clinic in 1 week if her symptoms does not get better or get worse -No antibiotics at this time -Work note was given for today.

## 2012-01-11 NOTE — Patient Instructions (Addendum)
Please schedule a follow up appointment in  3 months or sooner if needed . Please bring your medication bottles with your next appointment. Please take your medicines as prescribed. I will call you with your lab results if anything will be abnormal. Please drink plenty of fluid and call us if you do not feel better in 1 week.

## 2012-01-11 NOTE — Assessment & Plan Note (Signed)
She got her last pap smear at [facility]. - Will try to retrieve records.

## 2012-01-11 NOTE — Assessment & Plan Note (Signed)
Lab Results  Component Value Date   NA 138 10/29/2011   K 3.7 10/29/2011   CL 103 10/29/2011   CO2 25 10/29/2011   BUN 9 10/29/2011   CREATININE 0.97 10/29/2011    BP Readings from Last 3 Encounters:  01/11/12 113/76  11/24/11 121/68  10/29/11 99/67    Assessment: Hypertension control:  controlled  Progress toward goals:  at goal Barriers to meeting goals:  no barriers identified  Plan: Hypertension treatment:  continue current medications. I will keep a track of her BP and would consider giving her a trail off anti- HTN in the setting of fairly good BP control.

## 2012-02-23 ENCOUNTER — Ambulatory Visit (HOSPITAL_BASED_OUTPATIENT_CLINIC_OR_DEPARTMENT_OTHER): Payer: Medicaid Other | Admitting: Physical Medicine & Rehabilitation

## 2012-02-23 ENCOUNTER — Encounter: Payer: Medicaid Other | Attending: Physical Medicine & Rehabilitation

## 2012-02-23 ENCOUNTER — Encounter: Payer: Self-pay | Admitting: Physical Medicine & Rehabilitation

## 2012-02-23 VITALS — BP 119/72 | HR 92 | Resp 14 | Ht 63.0 in | Wt 157.0 lb

## 2012-02-23 DIAGNOSIS — M25559 Pain in unspecified hip: Secondary | ICD-10-CM | POA: Insufficient documentation

## 2012-02-23 DIAGNOSIS — M545 Low back pain, unspecified: Secondary | ICD-10-CM | POA: Insufficient documentation

## 2012-02-23 DIAGNOSIS — M129 Arthropathy, unspecified: Secondary | ICD-10-CM | POA: Insufficient documentation

## 2012-02-23 DIAGNOSIS — M533 Sacrococcygeal disorders, not elsewhere classified: Secondary | ICD-10-CM

## 2012-02-23 DIAGNOSIS — R209 Unspecified disturbances of skin sensation: Secondary | ICD-10-CM | POA: Insufficient documentation

## 2012-02-23 DIAGNOSIS — G8929 Other chronic pain: Secondary | ICD-10-CM | POA: Insufficient documentation

## 2012-02-23 NOTE — Progress Notes (Signed)
  PROCEDURE RECORD [facility]   Name: Sue [name] DOB:1975/07/03 MRN: [id number]  Date:02/23/2012  Physician: Claudette Laws, MD    Nurse/CMA: Kelli Churn, CMA/Shumaker, RN  Allergies:  Allergies  Allergen Reactions  . Sulfamethoxazole W-Trimethoprim     REACTION: septra--doesn't recall what type of reaction  . Amoxicillin Rash    Consent Signed: yes  Is patient diabetic? no   Pregnant: no LMP: Patient's last menstrual period was 01/11/2012. (age 89-55)  Anticoagulants: no Anti-inflammatory: no Antibiotics: no  Procedure: Bilateral Sacroiliac Steroid Injection  Position: Prone Start Time: 1:00  End Time: 1:05pm  Fluoro Time: 14 seconds  RN/CMA Kodi Guerrera, CMA Shumaker RN    Time 12:22pm 1:11    BP 119/72 133/75    Pulse 92 76    Respirations 14 14    O2 Sat 98 100    S/S 6 6    Pain Level 5 5/10     D/C home with Sister Tiffany, patient A & O X 3, D/C instructions reviewed, and sits independently.

## 2012-02-23 NOTE — Progress Notes (Signed)
  Subjective:    Patient ID: Sue [name], female    DOB: January 01, 1976, 36 y.o.   MRN: [id number]  HPI    Review of Systems     Objective:   Physical Exam        Assessment & Plan:  Bilateral sacroiliac injections under fluoroscopic guidance  Indication: Low back and buttocks pain not relieved by medication management and other conservative care.  Informed consent was obtained after describing risks and benefits of the procedure with the patient, this includes bleeding, bruising, infection, paralysis and medication side effects. The patient wishes to proceed and has given written consent. The patient was placed in a prone position. The lumbar and sacral area was marked and prepped with Betadine. A 25-gauge 1-1/2 inch needle was inserted into the skin and subcutaneous tissue and 1 mL of 1% lidocaine was injected into each side. Then a 25-gauge 3 inch spinal needle was inserted under fluoroscopic guidance into the left sacroiliac joint. AP and lateral images were utilized. Omnipaque 180x0.5 mL under live fluoroscopy demonstrated no intravascular uptake. Then a solution containing one ML of 40 mg per mL Depakote met drawl in 2 ML of 2% lidocaine MPF was injected x1.5 mL. This same procedure was repeated on the right side using the same needle, injectate, and technique. Patient tolerated the procedure well. Post procedure instructions were given. Please see post procedure form.

## 2012-02-23 NOTE — Patient Instructions (Addendum)
We can safely do these injections every 3 months

## 2012-04-23 ENCOUNTER — Emergency Department (HOSPITAL_COMMUNITY)
Admission: EM | Admit: 2012-04-23 | Discharge: 2012-04-23 | Disposition: A | Discharge status: Home / Self Care | Payer: Medicaid Other | Source: Home / Self Care | Attending: Family Medicine | Admitting: Family Medicine

## 2012-04-23 ENCOUNTER — Encounter (HOSPITAL_COMMUNITY): Payer: Self-pay

## 2012-04-23 DIAGNOSIS — H1010 Acute atopic conjunctivitis, unspecified eye: Secondary | ICD-10-CM

## 2012-04-23 DIAGNOSIS — J329 Chronic sinusitis, unspecified: Secondary | ICD-10-CM

## 2012-04-23 DIAGNOSIS — J309 Allergic rhinitis, unspecified: Secondary | ICD-10-CM

## 2012-04-23 MED ORDER — CETIRIZINE-PSEUDOEPHEDRINE ER 5-120 MG PO TB12: 1.0000 | ORAL_TABLET | Freq: Two times a day (BID) | ORAL | Status: DC

## 2012-04-23 MED ORDER — AZITHROMYCIN 250 MG PO TABS: ORAL_TABLET | ORAL | Status: DC

## 2012-04-23 MED ORDER — KETOTIFEN FUMARATE 0.025 % OP SOLN: 1.0000 [drp] | Freq: Two times a day (BID) | OPHTHALMIC | Status: DC

## 2012-04-23 MED ORDER — PREDNISONE 20 MG PO TABS: ORAL_TABLET | ORAL | Status: DC

## 2012-04-23 NOTE — ED Notes (Signed)
C/o several days issues of :sinus pain , drainage, pressure, facial swelling, left eye swollen and drainage

## 2012-04-23 NOTE — ED Provider Notes (Signed)
History     CSN: [id number]  Arrival date & time 04/23/12  1114   First MD Initiated Contact with Patient 04/23/12 1121      Chief Complaint  Patient presents with  . Facial Pain    (Consider location/radiation/quality/duration/timing/severity/associated sxs/prior treatment) HPI Comments: 37 year old female with history of asthma, seasonal allergies and chronic rhinitis. Comes complaining of nasal congestion with watery discharge mostly towards the right nostril symptoms associated with eye itchiness and clear tearing mostly from the right eye. Patient complains of sinus pressure mostly on the right side and facial pain. Denies purulent nasal or eye discharge. The patient reports headache. Denies fever or chills. Postnasal drip. Denies cough shortness of breath or wheezing. Appetite okay.   Past Medical History  Diagnosis Date  . Asthma     mild by PFT's 03/16/1998  . Sciatica 01/12/06  . History of abnormal Pap smear     s/p cryo( Dr. Wiliam Ke)  . Cervical lymphadenopathy     biopsy negativeMarvetta Gibbons)  . Normal vaginal delivery     x3  . Complete spontaneous abortion     x2  . Hypertension   . HSV (herpes simplex virus) anogenital infection   . GERD (gastroesophageal reflux disease)   . 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     Past Surgical History  Procedure Laterality Date  . Biopsy of lymph node in r neck  2007.    Family History  Problem Relation Age of Onset  . Ulcerative colitis Mother   . Hypertension Mother   . Diabetes Father   . Heart attack Father   . Hypertension Father   . Diabetes Sister   . Heart attack Maternal Grandmother     History  Substance Use Topics  . Smoking status: Never Smoker   . Smokeless tobacco: Never Used  . Alcohol Use: Yes     Comment: occasional beer and liquor on weekends     OB History   Grav Para Term Preterm Abortions TAB SAB Ect Mult Living                  Review of Systems  Constitutional: Negative for fever, chills and fatigue.  HENT: Positive for congestion, rhinorrhea, sneezing and postnasal drip. Negative for sore throat and neck pain.   Eyes: Positive for discharge and itching. Negative for photophobia, pain and visual disturbance.  Respiratory: Negative for cough, shortness of breath and wheezing.   Gastrointestinal: Negative for nausea and vomiting.  Neurological: Positive for headaches.  All other systems reviewed and are negative.    Allergies  Sulfamethoxazole w-trimethoprim and Amoxicillin  Home Medications   Current Outpatient Rx  Name  Route  Sig  Dispense  Refill  . amLODipine (NORVASC) 2.5 MG tablet   Oral   Take 2.5 mg by mouth daily.         Marland Kitchen levonorgestrel-ethinyl estradiol (SEASONALE) 0.15-0.03 MG per tablet   Oral   Take 1 tablet by mouth daily.           Marland Kitchen albuterol (PROVENTIL HFA) 108 (90 BASE) MCG/ACT inhaler   Inhalation   Inhale 2 puffs into the lungs every 6 (six) hours as needed for wheezing.   1 Inhaler   3   . azithromycin (ZITHROMAX) 250 MG tablet      2 tabs by mouth on day #1  then 1 tablet daily for 4 more days.   6 tablet   0   . cetirizine-pseudoephedrine (ZYRTEC-D) 5-120 MG per tablet   Oral   Take 1 tablet by mouth 2 (two) times daily.   30 tablet   0   . fluticasone (FLONASE) 50 MCG/ACT nasal spray   Nasal   Place 2 sprays into the nose daily as needed. For allergies.         . mesalamine (ROWASA) 4 G enema   Rectal   Place 60 mLs (4 g total) rectally at bedtime.   60 mL   0   . Multiple Vitamin (MULITIVITAMIN WITH MINERALS) TABS   Oral   Take 1 tablet by mouth daily.         . predniSONE (DELTASONE) 20 MG tablet      2 tabs by mouth daily for 5 days.   10 tablet   0   . valACYclovir (VALTREX) 500 MG tablet   Oral   Take 500 mg by mouth daily as needed. For  outbreaks.           BP 148/82  Pulse 81  Temp(Src) 99.8 F (37.7 C) (Oral)  Resp 16  SpO2 100%  Physical Exam  Nursing note and vitals reviewed. Constitutional: She is oriented to person, place, and time. She appears well-developed and well-nourished. No distress.  HENT:  Head: Normocephalic and atraumatic.  Right Ear: External ear normal.  Left Ear: External ear normal.  Nasal Congestion with significant erythema and swelling of nasal turbinates, clear rhinorrhea. Post nasal drip. pharyngeal erythema no exudates. No uvula deviation. No trismus. TM's with clear fluid behind bilateral.  Eyes: EOM are normal. Pupils are equal, round, and reactive to light. No scleral icterus.  Right eye: with erythema and chemosis of the conjunctival clear tearing.  Left eye with milder similar symptoms compared with right.  No periorbital erythema swelling or fluctuations.   Neck: Neck supple.  Cardiovascular: Normal heart sounds.   Pulmonary/Chest: Breath sounds normal.  Lymphadenopathy:    She has no cervical adenopathy.  Neurological: She is alert and oriented to person, place, and time.  Skin: No rash noted. She is not diaphoretic.    ED Course  Procedures (including critical care time)  Labs Reviewed - No data to display No results found.   1. Allergic conjunctivitis and rhinitis   2. Sinusitis       MDM  Impress allergic conjunctivitis and rhinosinusitis. Prescribed prednisone, cetirizine and supportive care. Given patient's chronic symptoms she is at risk for developing infectious sinusitis as a complication. Provided a hold prescription for azithromycin in case of not improving of her symptoms or worsening in the next 72 hours. Supportive care and red flags that should prompt her return to medical attention discussed with patient and provided in writing.        Sharin Grave, MD 04/25/12 [phone]

## 2012-05-19 ENCOUNTER — Encounter: Payer: Self-pay | Admitting: Physical Medicine & Rehabilitation

## 2012-05-19 ENCOUNTER — Encounter: Payer: Medicaid Other | Attending: Physical Medicine & Rehabilitation

## 2012-05-19 ENCOUNTER — Ambulatory Visit (HOSPITAL_BASED_OUTPATIENT_CLINIC_OR_DEPARTMENT_OTHER): Payer: Medicaid Other | Admitting: Physical Medicine & Rehabilitation

## 2012-05-19 VITALS — BP 122/56 | HR 72 | Resp 14 | Ht 63.0 in | Wt 161.0 lb

## 2012-05-19 DIAGNOSIS — G8929 Other chronic pain: Secondary | ICD-10-CM | POA: Insufficient documentation

## 2012-05-19 DIAGNOSIS — R209 Unspecified disturbances of skin sensation: Secondary | ICD-10-CM | POA: Insufficient documentation

## 2012-05-19 DIAGNOSIS — M533 Sacrococcygeal disorders, not elsewhere classified: Secondary | ICD-10-CM

## 2012-05-19 DIAGNOSIS — M25559 Pain in unspecified hip: Secondary | ICD-10-CM | POA: Insufficient documentation

## 2012-05-19 DIAGNOSIS — M545 Low back pain, unspecified: Secondary | ICD-10-CM | POA: Insufficient documentation

## 2012-05-19 DIAGNOSIS — M129 Arthropathy, unspecified: Secondary | ICD-10-CM | POA: Insufficient documentation

## 2012-05-19 NOTE — Progress Notes (Signed)
  Subjective:    Patient ID: Sue [name], female    DOB: 04-27-1975, 37 y.o.   MRN: [id number]  HPI Bilateral low back and buttocks pain   Review of Systems     Objective:   Physical Exam        Assessment & Plan:  1. 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 Pain has persisted despite conservative management will inject sacroiliac joints today  Bilateral sacroiliac injections under fluoroscopic guidance  Indication: Low back and buttocks pain not relieved by medication management and other conservative care.  Informed consent was obtained after describing risks and benefits of the procedure with the patient, this includes bleeding, bruising, infection, paralysis and medication side effects. The patient wishes to proceed and has given written consent. The patient was placed in a prone position. The lumbar and sacral area was marked and prepped with Betadine. A 25-gauge 1-1/2 inch needle was inserted into the skin and subcutaneous tissue and 1 mL of 1% lidocaine was injected into each side. Then a 25-gauge 3 inch spinal needle was inserted under fluoroscopic guidance into the left sacroiliac joint. AP and lateral images were utilized. Omnipaque 180x0.5 mL under live fluoroscopy demonstrated no intravascular uptake. Then a solution containing one ML of 40 mg per mL Depakote met drawl in 2 ML of 2% lidocaine MPF was injected x1.5 mL. This same procedure was repeated on the right side using the same needle, injectate, and technique. Patient tolerated the procedure well. Post procedure instructions were given. Please see post procedure form.Right anterior capsular rent was noted

## 2012-05-19 NOTE — Progress Notes (Signed)
  PROCEDURE RECORD [facility]   Name: PAIJE GOODHART DOB:March 06, 1976 MRN: [id number]  Date:05/19/2012  Physician: Claudette Laws, MD    Nurse/CMA: Kelli Churn, CMA/Shumaker, RN  Allergies:  Allergies  Allergen Reactions  . Sulfamethoxazole W-Trimethoprim     REACTION: septra--doesn't recall what type of reaction  . Amoxicillin Rash    Consent Signed: yes  Is patient diabetic? no    Pregnant: no LMP: Patient's last menstrual period was 04/05/2012. (age 20-55)  Anticoagulants: no Anti-inflammatory: no Antibiotics: no  Procedure: bilateral sacroiliac steroid injections  Position: Prone Start Time: 10:25 End Time: 10:31  Fluoro Time: 16 seconds  RN/CMA Levens, CMA Shumaker, RN    Time 950 10:36    BP 122/56 133/78    Pulse 77 71    Respirations 14 14    O2 Sat 99 100    S/S 6 6    Pain Level 4/10 0/10     D/C home with Tiffany-sister, patient A & O X 3, D/C instructions reviewed, and sits independently.

## 2012-05-19 NOTE — Patient Instructions (Signed)
Sacroiliac Joint Dysfunction The sacroiliac joint connects the lower part of the spine (the sacrum) with the bones of the pelvis. CAUSES  Sometimes, there is no obvious reason for sacroiliac joint dysfunction. Other times, it may occur   During pregnancy.  After injury, such as:  Car accidents.  Sport-related injuries.  Work-related injuries.  Due to one leg being shorter than the other.  Due to other conditions that affect the joints, such as:  Rheumatoid arthritis.  Inflammatory bowel disease including proctitis  Psoriasis.  Joint infection (septic arthritis). SYMPTOMS  Symptoms may include:  Pain in the:  Lower back.  Buttocks.  Groin.  Thighs and legs.  Difficult sitting, standing, walking, lying, bending or lifting. DIAGNOSIS  A number of tests may be used to help diagnose the cause of sacroiliac joint dysfunction, including:  Imaging tests to look for other causes of pain, including:  MRI.  CT scan.  Bone scan.  Diagnostic injection: During a special x-ray (called fluoroscopy), a needle is put into the sacroiliac joint. A numbing medicine is injected into the joint. If the pain is improved or stopped, the diagnosis of sacroiliac joint dysfunction is more likely. TREATMENT  There are a number of types of treatment used for sacroiliac joint dysfunction, including:  Only take over-the-counter or prescription medicines for pain, discomfort, or fever as directed by your caregiver.  Medications to relax muscles.  Rest. Decreasing activity can help cut down on painful muscle spasms and allow the back to heal.  Application of heat or ice to the lower back may improve muscle spasms and soothe pain.  Brace. A special back brace, called a sacroiliac belt, can help support the joint while your back is healing.  Physical therapy can help teach comfortable positions and exercises to strengthen muscles that support the sacroiliac joint.  Cortisone injections.  Injections of steroid medicine into the joint can help decrease swelling and improve pain.  Hyaluronic acid injections. This chemical improves lubrication within the sacroiliac joint, thereby decreasing pain.  Radiofrequency ablation. A special needle is placed into the joint, where it burns away nerves that are carrying pain messages from the joint.  Surgery. Because pain occurs during movement of the joint, screws and plates may be installed in order to limit or prevent joint motion. HOME CARE INSTRUCTIONS   Take all medications exactly as directed.  Follow instructions regarding both rest and physical activity, to avoid worsening the pain.  Do physical therapy exercises exactly as prescribed. SEEK IMMEDIATE MEDICAL CARE IF:  You experience increasingly severe pain.  You develop new symptoms, such as numbness or tingling in your legs or feet.  You lose bladder or bowel control. Document Released: 05/22/2008 Document Revised: 05/18/2011 Document Reviewed: 05/22/2008 [facility] Patient Information 2013 [place], [place].

## 2012-07-05 ENCOUNTER — Encounter: Payer: Self-pay | Admitting: Obstetrics

## 2012-08-04 ENCOUNTER — Ambulatory Visit (INDEPENDENT_AMBULATORY_CARE_PROVIDER_SITE_OTHER): Payer: Medicaid Other | Admitting: Obstetrics

## 2012-08-04 ENCOUNTER — Encounter: Payer: Self-pay | Admitting: Obstetrics

## 2012-08-04 VITALS — BP 148/99 | HR 99 | Temp 98.4°F | Ht 63.0 in | Wt 162.0 lb

## 2012-08-04 DIAGNOSIS — N390 Urinary tract infection, site not specified: Secondary | ICD-10-CM

## 2012-08-04 DIAGNOSIS — N760 Acute vaginitis: Secondary | ICD-10-CM

## 2012-08-04 DIAGNOSIS — Z113 Encounter for screening for infections with a predominantly sexual mode of transmission: Secondary | ICD-10-CM

## 2012-08-04 DIAGNOSIS — Z0000 Encounter for general adult medical examination without abnormal findings: Secondary | ICD-10-CM

## 2012-08-04 NOTE — Progress Notes (Signed)
.   Subjective:     Sue [name] is a 37 y.o. female here for a routine exam and STD testing including blood work..  No current complaints.  Personal health questionnaire reviewed: yes.   Gynecologic History Patient's last menstrual period was 06/30/2012. Contraception: OCP (estrogen/progesterone) Last Pap:2013. Results were: normal Last mammogram: N/A  Obstetric History OB History   Grav Para Term Preterm Abortions TAB SAB Ect Mult Living                   The following portions of the patient's history were reviewed and updated as appropriate: allergies, current medications, past family history, past medical history, past social history, past surgical history and problem list.  Review of Systems Pertinent items are noted in HPI.    Objective:    General appearance: alert and no distress Breasts: normal appearance, no masses or tenderness Abdomen: normal findings: soft, non-tender Pelvic: cervix normal in appearance, external genitalia normal, no adnexal masses or tenderness, no cervical motion tenderness, uterus normal size, shape, and consistency and vagina normal without discharge    Assessment:    Healthy female exam.    Plan:    Follow up in: 1 year.

## 2012-08-05 ENCOUNTER — Encounter: Payer: Self-pay | Admitting: Obstetrics

## 2012-08-05 LAB — WET PREP BY MOLECULAR PROBE
Candida species: NEGATIVE
Gardnerella vaginalis: NEGATIVE
Trichomonas vaginosis: NEGATIVE

## 2012-08-05 LAB — PAP IG W/ RFLX HPV ASCU

## 2012-08-05 LAB — GC/CHLAMYDIA PROBE AMP
CT Probe RNA: NEGATIVE
GC Probe RNA: NEGATIVE

## 2012-08-05 LAB — HEPATITIS C ANTIBODY: HCV Ab: NEGATIVE

## 2012-08-05 LAB — HEPATITIS B SURFACE ANTIGEN: Hepatitis B Surface Ag: NEGATIVE

## 2012-08-05 LAB — HIV ANTIBODY (ROUTINE TESTING W REFLEX): HIV: NONREACTIVE

## 2012-08-05 LAB — RPR

## 2012-08-06 LAB — URINE CULTURE
Colony Count: NO GROWTH
Organism ID, Bacteria: NO GROWTH

## 2012-08-18 ENCOUNTER — Encounter: Payer: Medicaid Other | Attending: Physical Medicine & Rehabilitation

## 2012-08-18 ENCOUNTER — Ambulatory Visit (HOSPITAL_BASED_OUTPATIENT_CLINIC_OR_DEPARTMENT_OTHER): Payer: Medicaid Other | Admitting: Physical Medicine & Rehabilitation

## 2012-08-18 ENCOUNTER — Encounter: Payer: Self-pay | Admitting: Physical Medicine & Rehabilitation

## 2012-08-18 VITALS — BP 120/70 | HR 74 | Resp 14 | Ht 63.0 in | Wt 163.2 lb

## 2012-08-18 DIAGNOSIS — M545 Low back pain, unspecified: Secondary | ICD-10-CM | POA: Insufficient documentation

## 2012-08-18 DIAGNOSIS — M533 Sacrococcygeal disorders, not elsewhere classified: Secondary | ICD-10-CM

## 2012-08-18 DIAGNOSIS — M25559 Pain in unspecified hip: Secondary | ICD-10-CM | POA: Insufficient documentation

## 2012-08-18 DIAGNOSIS — M129 Arthropathy, unspecified: Secondary | ICD-10-CM | POA: Insufficient documentation

## 2012-08-18 DIAGNOSIS — R209 Unspecified disturbances of skin sensation: Secondary | ICD-10-CM | POA: Insufficient documentation

## 2012-08-18 DIAGNOSIS — G8929 Other chronic pain: Secondary | ICD-10-CM | POA: Insufficient documentation

## 2012-08-18 NOTE — Patient Instructions (Signed)
See me in 3 months for repeat injection of both sacroiliac joints

## 2012-08-18 NOTE — Progress Notes (Signed)
  PROCEDURE RECORD [facility]   Name: TIFFANNI SCARFO DOB:June 13, 1975 MRN: [id number]  Date:08/18/2012  Physician: Claudette Laws, MD    Nurse/CMA: Shumaker RN  Allergies:  Allergies  Allergen Reactions  . Sulfamethoxazole W-Trimethoprim     REACTION: septra--doesn't recall what type of reaction  . Amoxicillin Rash    Consent Signed: yes  Is patient diabetic? no  CBG today?   Pregnant: no LMP: Patient's last menstrual period was 07/09/2012. (age 37-55) every three months with her type of BC  Anticoagulants: no Anti-inflammatory: no Antibiotics: no  Procedure: Bilateral Sacroiliac Steroid Injection Position: Prone Start Time: 10:12  End Time: 10:14 Fluoro Time: 14 seconds  RN/CMA Designer, multimedia    Time 9:54 10:19    BP 120/70 144/72    Pulse 74 70    Respirations 14 14    O2 Sat 99 97    S/S 6 6    Pain Level 2 0/10     D/C home with sister, patient A & O X 3, D/C instructions reviewed, and sits independently.

## 2012-08-18 NOTE — Progress Notes (Signed)
1. 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 Pain has persisted despite conservative management will inject sacroiliac joints today  Bilateral sacroiliac injections under fluoroscopic guidance  Indication: Low back and buttocks pain not relieved by medication management and other conservative care.  Informed consent was obtained after describing risks and benefits of the procedure with the patient, this includes bleeding, bruising, infection, paralysis and medication side effects. The patient wishes to proceed and has given written consent. The patient was placed in a prone position. The lumbar and sacral area was marked and prepped with Betadine. A 25-gauge 1-1/2 inch needle was inserted into the skin and subcutaneous tissue and 1 mL of 1% lidocaine was injected into each side. Then a 25-gauge 3 inch spinal needle was inserted under fluoroscopic guidance into the left sacroiliac joint. AP and lateral images were utilized. Omnipaque 180x0.5 mL under live fluoroscopy demonstrated no intravascular uptake. Then a solution containing one ML of 40 mg per mL depomedrol in 2 ML of 2% lidocaine MPF was injected x1.5 mL. This same procedure was repeated on the right side using the same needle, injectate, and technique. Patient tolerated the procedure well. Post procedure instructions were given. Please see post procedure form.Right anterior capsular rent was noted

## 2012-08-22 ENCOUNTER — Encounter: Payer: Self-pay | Admitting: Obstetrics

## 2012-09-02 ENCOUNTER — Encounter: Payer: Self-pay | Admitting: Obstetrics

## 2012-09-12 ENCOUNTER — Encounter: Payer: Self-pay | Admitting: Physical Medicine & Rehabilitation

## 2012-09-12 ENCOUNTER — Encounter: Payer: Medicaid Other | Attending: Physical Medicine & Rehabilitation

## 2012-09-12 ENCOUNTER — Ambulatory Visit (HOSPITAL_BASED_OUTPATIENT_CLINIC_OR_DEPARTMENT_OTHER): Payer: Medicaid Other | Admitting: Physical Medicine & Rehabilitation

## 2012-09-12 VITALS — BP 143/84 | HR 88 | Resp 14 | Ht 63.0 in | Wt 164.6 lb

## 2012-09-12 DIAGNOSIS — M25559 Pain in unspecified hip: Secondary | ICD-10-CM | POA: Insufficient documentation

## 2012-09-12 DIAGNOSIS — M545 Low back pain, unspecified: Secondary | ICD-10-CM | POA: Insufficient documentation

## 2012-09-12 DIAGNOSIS — G8929 Other chronic pain: Secondary | ICD-10-CM | POA: Insufficient documentation

## 2012-09-12 DIAGNOSIS — M461 Sacroiliitis, not elsewhere classified: Secondary | ICD-10-CM

## 2012-09-12 DIAGNOSIS — R209 Unspecified disturbances of skin sensation: Secondary | ICD-10-CM | POA: Insufficient documentation

## 2012-09-12 DIAGNOSIS — M129 Arthropathy, unspecified: Secondary | ICD-10-CM | POA: Insufficient documentation

## 2012-09-12 MED ORDER — CYCLOBENZAPRINE HCL 5 MG PO TABS: 5.0000 mg | ORAL_TABLET | Freq: Three times a day (TID) | ORAL | Status: DC | PRN

## 2012-09-12 MED ORDER — METHYLPREDNISOLONE 4 MG PO KIT: PACK | ORAL | Status: DC

## 2012-09-12 NOTE — Patient Instructions (Signed)
Cortisone tablets as directed Take muscle relaxants as needed. If it makes you very tired just take in the evening

## 2012-09-12 NOTE — Progress Notes (Signed)
Subjective:    Patient ID: Sue Sue [name], female    DOB: [birth year], 37 y.o.   MRN: [id number]  HPI Bilateral sacroiliac injections Jun 12,2014.  This helped for a couple weeks.  Starting last THursday night R sided low back pain flare up. Pain radiates to R knee.  Numbness knee to R ankle No bowel or bladder problems No sweats or chills Takes naproxen twice a day Pain is worst first thing in am.  Pain Inventory Average Pain 3 Pain Right Now 10 My pain is constant, sharp, stabbing and acute onset  In the last 24 hours, has pain interfered with the following? General activity 10 Relation with others 10 Enjoyment of life 10 What TIME of day is your pain at its worst? all Sleep (in general) Poor  Pain is worse with: walking, bending, sitting, inactivity, standing and some activites Pain improves with: heat/ice Relief from Meds: 0  Mobility walk without assistance  Function I need assistance with the following:  meal prep, household duties and shopping  Neuro/Psych weakness numbness trouble walking spasms  Prior Studies Any changes since last visit?  no MRI lumbar spine 2011 demonstrated Sue [name] right-sided L5-S1 disc protrusion without evidence of nerve root compression. There was also mild lumbar facet arthrosis Physicians involved in your care Any changes since last visit?  no   Family History  Problem Relation Age of Onset  . Ulcerative colitis Mother   . Hypertension Mother   . Diabetes Father   . Heart attack Father   . Hypertension Father   . Diabetes Sister   . Heart attack Maternal Grandmother    History   Social History  . Marital Status: Single    Spouse Name: N/A    Number of Children: N/A  . Years of Education: N/A   Social History Main Topics  . Smoking status: Never Smoker   . Smokeless tobacco: Never Used  . Alcohol Use: Yes     Comment: occasional beer and liquor on weekends  . Drug Use: None  . Sexually Active: None   Other Topics  Concern  . None   Social History Narrative  . None   Past Surgical History  Procedure Laterality Date  . Biopsy of lymph node in r neck  2007.   Past Medical History  Diagnosis Date  . Asthma     mild by PFT's 03/16/1998  . Sciatica 01/12/06  . History of abnormal Pap smear     s/p cryo( Sue. Wiliam [name])  . Cervical lymphadenopathy     biopsy negativeMarvetta Sue [name])  . Normal vaginal delivery     x3  . Complete spontaneous abortion     x2  . Hypertension   . HSV (herpes simplex virus) anogenital infection   . GERD (gastroesophageal reflux disease)   . 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    BP 143/84  Pulse 88  Resp 14  Ht 5\' 3"  (1.6 m)  Wt 164 lb 9.6 oz (74.662 kg)  BMI 29.16 kg/m2  SpO2 98%  LMP 07/09/2012   Review of Systems  Musculoskeletal: Positive for back pain and gait problem.  Neurological: Positive for weakness and numbness.       Tingling  All other systems reviewed and are negative.       Objective:   Physical Exam  Nursing note and vitals reviewed.  Constitutional: She appears well-developed and well-nourished.  HENT:  Head: Normocephalic and atraumatic.  Eyes: Conjunctivae and EOM are normal. Pupils are equal, round, and reactive to light.  Neck: Normal range of motion.   Sensation normal in the lower extremities Strength is normal in the lower extremities The plantar reflexes are normal in the lower extremities Range of motion is normal in the hips and knees and ankles bilateral faber test is negative bilateral Straight leg raising test is negative Tenderness over the right PSIS and right gluteus, negative Over right greater trochanter    Assessment & Plan:  Sacroiliitis associated with inflammatory bowel disease. Her inflammatory bowel disease however is well controlled. She did respond well to  sacroiliac injections but now seems flared up in the right SI joint. We'll treat with medrol Dosepak along with cyclobenzaprine.  RTC 2 weeks with PA. Instruct some stabilization exercises and followup on flareup. Repeat SI injection scheduled for September

## 2012-09-27 ENCOUNTER — Encounter: Payer: Medicaid Other | Admitting: Physical Medicine and Rehabilitation

## 2012-09-30 ENCOUNTER — Encounter
Payer: Medicaid Other | Attending: Physical Medicine and Rehabilitation | Admitting: Physical Medicine and Rehabilitation

## 2012-09-30 ENCOUNTER — Encounter: Payer: Self-pay | Admitting: Physical Medicine and Rehabilitation

## 2012-09-30 VITALS — BP 141/74 | HR 82 | Resp 14 | Ht 63.0 in | Wt 162.0 lb

## 2012-09-30 DIAGNOSIS — M533 Sacrococcygeal disorders, not elsewhere classified: Secondary | ICD-10-CM

## 2012-09-30 DIAGNOSIS — M47817 Spondylosis without myelopathy or radiculopathy, lumbosacral region: Secondary | ICD-10-CM | POA: Insufficient documentation

## 2012-09-30 MED ORDER — DICLOFENAC SODIUM 1 % TD GEL: 2.0000 g | Freq: Four times a day (QID) | TRANSDERMAL | Status: DC

## 2012-09-30 NOTE — Patient Instructions (Addendum)
Try to do the exercises I showed you , regularly , 5 times a week

## 2012-09-30 NOTE — Progress Notes (Signed)
Subjective:    Patient ID: Sue [name], female    DOB: [birth year], 37 y.o.   MRN: [id number]  HPI The patient is a 37 year old female, who presents with LBP, mainly on the right side . The symptoms started 7 years ago, after her 3rd pregnancy. The patient complains about moderate to severe pain   , which radiates into her thighs, can be either side , today it is the right .  Patient also complains about numbness and tingling in the same distribution . She describes the pain as aching with intermittend stabbing. Applying heat, taking medications , changing positions alleviate the symptoms. Prolonged standing, walking and sitting aggrevates the symptoms. The patient grades his pain as a  6/10. Pain Inventory Average Pain 10 Pain Right Now 6 My pain is sharp, stabbing and aching  In the last 24 hours, has pain interfered with the following? General activity 8 Relation with others 9 Enjoyment of life 9 What TIME of day is your pain at its worst? day and evening Sleep (in general) Poor  Pain is worse with: walking, bending, sitting, inactivity and standing Pain improves with: injections Relief from Meds: 5  Mobility Do you have any goals in this area?  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  no  Neuro/Psych weakness numbness trouble walking  Prior Studies Any changes since last visit?  no  Physicians involved in your care Any changes since last visit?  no   Family History  Problem Relation Age of Onset  . Ulcerative colitis Mother   . Hypertension Mother   . Diabetes Father   . Heart attack Father   . Hypertension Father   . Diabetes Sister   . Heart attack Maternal Grandmother    History   Social History  . Marital Status: Single    Spouse Name: N/A    Number of Children: N/A  . Years of Education: N/A   Social History Main Topics  . Smoking status: Never Smoker   . Smokeless tobacco: Never Used  . Alcohol Use:  Yes     Comment: occasional beer and liquor on weekends  . Drug Use: None  . Sexually Active: None   Other Topics Concern  . None   Social History Narrative  . None   Past Surgical History  Procedure Laterality Date  . Biopsy of lymph node in r neck  2007.   Past Medical History  Diagnosis Date  . Asthma     mild by PFT's 03/16/1998  . Sciatica 01/12/06  . History of abnormal Pap smear     s/p cryo( Dr. Wiliam Ke)  . Cervical lymphadenopathy     biopsy negativeMarvetta Gibbons)  . Normal vaginal delivery     x3  . Complete spontaneous abortion     x2  . Hypertension   . HSV (herpes simplex virus) anogenital infection   . GERD (gastroesophageal reflux disease)   . 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    BP 141/74  Pulse 82  Resp 14  Ht 5\' 3"  (1.6 m)  Wt 162 lb (73.483 kg)  BMI 28.7 kg/m2  SpO2 98%     Review of Systems  Musculoskeletal: Positive for back pain and gait problem.  Neurological: Positive for weakness and numbness.  All other  systems reviewed and are negative.       Objective:   Physical Exam  Constitutional: She is oriented to person, place, and time. She appears well-developed and well-nourished.  HENT:  Head: Normocephalic.  Neck: Neck supple.  Musculoskeletal: She exhibits tenderness.  Neurological: She is alert and oriented to person, place, and time.  Skin: Skin is warm and dry.  Psychiatric: She has a normal mood and affect.    Symmetric normal motor tone is noted throughout. Normal muscle bulk. Muscle testing reveals 5/5 muscle strength of the upper extremity, and 5/5 of the lower extremity. Full range of motion in upper and lower extremities. ROM of spine is restricted. Fine motor movements are normal in both hands. Sensory is intact and symmetric to light touch, pinprick and  proprioception. DTR in the upper and lower extremity are present and symmetric 2+. No clonus is noted.  Patient arises from chair with mild difficulty. Narrow based gait, slight antalgic limp on the right, with normal arm swing bilateral , able to walk on heels and toes . Tandem walk is stable. No pronator drift. Rhomberg negative.  Protruded abdomen, weak abdominal muscles       Assessment & Plan:  This is a 37 year old female with 1.lumbar spondylosis, with radiating Sx into her anterior thigh on the right. 2.SI dysfunction, last injection in June only gave her relief for 3 weeks.Prescribed Voltaren gel.  Plan : Advised patient to do the exercises I showed her today, regularly, 5 times a week. Explained in great detail anatomy of SI and lower back, and how the muscles around this area stabilize this region. Spent 25 min with patient face to face. If this strengthening program does not give her relief, and radiating Sx do not get better, consider MRI of L-spine, last one was in 2011. Follow up in 2 month, has appointment with Dr. Doroteo Bradford

## 2012-11-18 ENCOUNTER — Encounter: Payer: Self-pay | Admitting: Physical Medicine & Rehabilitation

## 2012-11-18 ENCOUNTER — Ambulatory Visit (HOSPITAL_BASED_OUTPATIENT_CLINIC_OR_DEPARTMENT_OTHER): Payer: Medicaid Other | Admitting: Physical Medicine & Rehabilitation

## 2012-11-18 ENCOUNTER — Encounter: Payer: Medicaid Other | Attending: Physical Medicine & Rehabilitation

## 2012-11-18 VITALS — BP 141/75 | HR 81 | Resp 14

## 2012-11-18 DIAGNOSIS — M25559 Pain in unspecified hip: Secondary | ICD-10-CM | POA: Insufficient documentation

## 2012-11-18 DIAGNOSIS — M129 Arthropathy, unspecified: Secondary | ICD-10-CM | POA: Insufficient documentation

## 2012-11-18 DIAGNOSIS — M533 Sacrococcygeal disorders, not elsewhere classified: Secondary | ICD-10-CM

## 2012-11-18 DIAGNOSIS — R209 Unspecified disturbances of skin sensation: Secondary | ICD-10-CM | POA: Insufficient documentation

## 2012-11-18 DIAGNOSIS — M545 Low back pain, unspecified: Secondary | ICD-10-CM | POA: Insufficient documentation

## 2012-11-18 DIAGNOSIS — G8929 Other chronic pain: Secondary | ICD-10-CM | POA: Insufficient documentation

## 2012-11-18 NOTE — Patient Instructions (Signed)
May repeat bilateral sacroiliac joint injection in 3 months This problem may be related to ulcerative proctitis

## 2012-11-18 NOTE — Progress Notes (Signed)
  PROCEDURE RECORD [facility]   Name: Sue [name] DOB:12-19-1975 MRN: [id number]  Date:11/18/2012  Physician: Claudette Laws, MD    Nurse/CMA: Kelli Churn, CMA  Allergies:  Allergies  Allergen Reactions  . Sulfamethoxazole W-Trimethoprim     REACTION: septra--doesn't recall what type of reaction  . Amoxicillin Rash    Consent Signed: yes  Is patient diabetic? no    Pregnant: no LMP: Patient's last menstrual period was 10/05/2012. (age 32-55)  Anticoagulants: no Anti-inflammatory: yes (naproxen-11/17/12) Antibiotics: no  Procedure: bilateral sacroiliac steroid injection  Position: Prone Start Time: 919  End Time:  928 Fluoro Time: 20  RN/CMA Kamaljit Hizer, CMA Aalliyah Kilker, CMA    Time 904 931    BP 141/75 145/86    Pulse 81 78    Respirations 14 14    O2 Sat 100 100    S/S 6 6    Pain Level 6/10 4/10     D/C home with [name], patient A & O X 3, D/C instructions reviewed, and sits independently.

## 2012-11-18 NOTE — Progress Notes (Signed)
1. 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 Pain has persisted despite conservative management will inject sacroiliac joints today  Bilateral sacroiliac injections under fluoroscopic guidance  Indication: Low back and buttocks pain not relieved by medication management and other conservative care.  Informed consent was obtained after describing risks and benefits of the procedure with the patient, this includes bleeding, bruising, infection, paralysis and medication side effects. The patient wishes to proceed and has given written consent. The patient was placed in a prone position. The lumbar and sacral area was marked and prepped with Betadine. A 25-gauge 1-1/2 inch needle was inserted into the skin and subcutaneous tissue and 1 mL of 1% lidocaine was injected into each side. Then a 25-gauge 3 inch spinal needle was inserted under fluoroscopic guidance into the left sacroiliac joint. AP and lateral images were utilized. Omnipaque 180x0.5 mL under live fluoroscopy demonstrated no intravascular uptake. Then a solution containing one ML of 40 mg per mL depomedrol in 2 ML of 2% lidocaine MPF was injected x1.5 mL. This same procedure was repeated on the right side using the same needle, injectate, and technique. Patient tolerated the procedure well. Post procedure instructions were given. Please see post procedure form.Right anterior capsular rent was noted

## 2012-11-25 ENCOUNTER — Encounter: Payer: Self-pay | Admitting: Internal Medicine

## 2012-11-25 ENCOUNTER — Ambulatory Visit (INDEPENDENT_AMBULATORY_CARE_PROVIDER_SITE_OTHER): Payer: Medicaid Other | Admitting: Internal Medicine

## 2012-11-25 ENCOUNTER — Encounter (HOSPITAL_COMMUNITY): Payer: Self-pay | Admitting: *Deleted

## 2012-11-25 ENCOUNTER — Ambulatory Visit (HOSPITAL_COMMUNITY)
Admission: RE | Admit: 2012-11-25 | Discharge: 2012-11-25 | Disposition: A | Discharge status: Home / Self Care | Payer: Medicaid Other | Source: Ambulatory Visit | Attending: Orthopedic Surgery | Admitting: Orthopedic Surgery

## 2012-11-25 ENCOUNTER — Other Ambulatory Visit: Payer: Self-pay

## 2012-11-25 ENCOUNTER — Emergency Department (HOSPITAL_COMMUNITY)
Admission: EM | Admit: 2012-11-25 | Discharge: 2012-11-25 | Disposition: A | Discharge status: Home / Self Care | Payer: Medicaid Other | Attending: Emergency Medicine | Admitting: Emergency Medicine

## 2012-11-25 ENCOUNTER — Emergency Department (HOSPITAL_COMMUNITY): Payer: Medicaid Other

## 2012-11-25 VITALS — BP 123/78 | HR 74 | Temp 97.6°F | Ht 63.0 in | Wt 165.0 lb

## 2012-11-25 DIAGNOSIS — Z8619 Personal history of other infectious and parasitic diseases: Secondary | ICD-10-CM | POA: Insufficient documentation

## 2012-11-25 DIAGNOSIS — Z880 Allergy status to penicillin: Secondary | ICD-10-CM | POA: Insufficient documentation

## 2012-11-25 DIAGNOSIS — R0789 Other chest pain: Secondary | ICD-10-CM

## 2012-11-25 DIAGNOSIS — Z8742 Personal history of other diseases of the female genital tract: Secondary | ICD-10-CM | POA: Insufficient documentation

## 2012-11-25 DIAGNOSIS — I10 Essential (primary) hypertension: Secondary | ICD-10-CM | POA: Insufficient documentation

## 2012-11-25 DIAGNOSIS — J45909 Unspecified asthma, uncomplicated: Secondary | ICD-10-CM | POA: Insufficient documentation

## 2012-11-25 DIAGNOSIS — Z79899 Other long term (current) drug therapy: Secondary | ICD-10-CM | POA: Insufficient documentation

## 2012-11-25 DIAGNOSIS — IMO0002 Reserved for concepts with insufficient information to code with codable children: Secondary | ICD-10-CM | POA: Insufficient documentation

## 2012-11-25 DIAGNOSIS — R079 Chest pain, unspecified: Secondary | ICD-10-CM

## 2012-11-25 DIAGNOSIS — Z8719 Personal history of other diseases of the digestive system: Secondary | ICD-10-CM | POA: Insufficient documentation

## 2012-11-25 DIAGNOSIS — R071 Chest pain on breathing: Secondary | ICD-10-CM | POA: Insufficient documentation

## 2012-11-25 LAB — BASIC METABOLIC PANEL
BUN: 8 mg/dL (ref 6–23)
CO2: 25 mEq/L (ref 19–32)
Calcium: 9 mg/dL (ref 8.4–10.5)
Chloride: 102 mEq/L (ref 96–112)
Creatinine, Ser: 0.86 mg/dL (ref 0.50–1.10)
GFR calc Af Amer: >90 mL/min (ref >90)
GFR calc non Af Amer: 86 mL/min — ABNORMAL LOW (ref >90)
Glucose, Bld: 95 mg/dL (ref 70–99)
Potassium: 3.6 mEq/L (ref 3.5–5.1)
Sodium: 137 mEq/L (ref 135–145)

## 2012-11-25 LAB — LIPID PANEL
Cholesterol: 145 mg/dL (ref 0–200)
HDL: 39 mg/dL — ABNORMAL LOW (ref >39)
LDL Cholesterol: 65 mg/dL (ref 0–99)
Total CHOL/HDL Ratio: 3.7 Ratio
Triglycerides: 204 mg/dL — ABNORMAL HIGH (ref <150)
VLDL: 41 mg/dL — ABNORMAL HIGH (ref 0–40)

## 2012-11-25 LAB — D-DIMER, QUANTITATIVE: D-Dimer, Quant: 0.61 ug/mL-FEU — ABNORMAL HIGH (ref 0.00–0.48)

## 2012-11-25 LAB — TROPONIN I: Troponin I: <0.3 ng/mL (ref <0.30)

## 2012-11-25 MED ORDER — FENTANYL CITRATE 0.05 MG/ML IJ SOLN
50.0000 ug | Freq: Once | INTRAMUSCULAR | Status: AC
  Administered 2012-11-25: 50 ug via INTRAVENOUS
  Filled 2012-11-25: qty 2

## 2012-11-25 MED ORDER — SODIUM CHLORIDE 0.9 % IV BOLUS (SEPSIS)
1000.0000 mL | Freq: Once | INTRAVENOUS | Status: AC
  Administered 2012-11-25: 1000 mL via INTRAVENOUS

## 2012-11-25 MED ORDER — IOHEXOL 350 MG/ML SOLN
100.0000 mL | Freq: Once | INTRAVENOUS | Status: AC | PRN
  Administered 2012-11-25: 100 mL via INTRAVENOUS

## 2012-11-25 MED ORDER — IBUPROFEN 800 MG PO TABS: 800.0000 mg | ORAL_TABLET | Freq: Three times a day (TID) | ORAL | Status: DC

## 2012-11-25 MED ORDER — OXYCODONE-ACETAMINOPHEN 5-325 MG PO TABS: 2.0000 | ORAL_TABLET | ORAL | Status: DC | PRN

## 2012-11-25 NOTE — ED Notes (Signed)
Monday at 3 am pt was awoken from sleep with sharp chest pain.  It lasted several minutes and scared her.  The pain subsided, and since then the pt has been feeling soreness.  Pain increases with palpation and inspiration.  Sent here from clinic for + d-dimer.

## 2012-11-25 NOTE — Progress Notes (Signed)
Patient ID: Sue [name], female   DOB: 08-30-1975, 37 y.o.   MRN: [id number] Subjective:   Patient ID: Sue [name] female   DOB: Jun 16, 1975 37 y.o.   MRN: [id number]  CC:  Acute visit due to chest pain. HPI:  Sue [name] is a 37 y.o. lady with past medical history as outlined below, who present for an acute visit today.  Patient is currently taking oral contraceptives pills. She reports that 4 days ago she was woken up by a sharp chest pain in the early morning. Her chest is located in the substernal area, 10 out of 10 in severity initially, sharp, nonradiating. Her chest pain lasted for several minutes, then subsided. After that episode, she still has persistent mild chest pain over the front chest. The pain is mild. It is aggravated by deep breath. It is not aggravated by exertion. The pain does not change with leaning forward position. Patient does not have a fever or chills. No cough. She denies recent viral infection. No recent long distance traveling. No pain in calf areas. She feels a little tired.  She denies cough, fever or chill. She is hemodynamically stable with blood pressure 123/78 and heart rate 74 currently. Oxygen saturation at 100%. Her D-Dimer elevated at 0.61.  ROS:  Denies fever, chills, headaches, abdominal pain, diarrhea, constipation, dysuria, urgency, frequency, hematuria, joint pain or leg swelling.   Past Medical History  Diagnosis Date  . Asthma     mild by PFT's 03/16/1998  . Sciatica 01/12/06  . History of abnormal Pap smear     s/p cryo( Dr. Wiliam Ke)  . Cervical lymphadenopathy     biopsy negativeMarvetta Gibbons)  . Normal vaginal delivery     x3  . Complete spontaneous abortion     x2  . Hypertension   . HSV (herpes simplex virus) anogenital infection   . GERD (gastroesophageal reflux disease)   . 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    Current Outpatient Prescriptions  Medication Sig Dispense Refill  . albuterol (PROVENTIL HFA) 108 (90 BASE) MCG/ACT inhaler Inhale 2 puffs into the lungs every 6 (six) hours as needed for wheezing.  1 Inhaler  3  . amLODipine (NORVASC) 2.5 MG tablet Take 2.5 mg by mouth daily.      . cetirizine-pseudoephedrine (ZYRTEC-D) 5-120 MG per tablet Take 1 tablet by mouth 2 (two) times daily.  30 tablet  0  . cyclobenzaprine (FLEXERIL) 5 MG tablet Take 1 tablet (5 mg total) by mouth 3 (three) times daily as needed for muscle spasms.  30 tablet  0  . diclofenac sodium (VOLTAREN) 1 % GEL Apply 2 g topically 4 (four) times daily.  2 Tube  2  . fluticasone (FLONASE) 50 MCG/ACT nasal spray Place 2 sprays into the nose daily as needed. For allergies.      Marland Kitchen ketotifen (ZADITOR) 0.025 % ophthalmic solution Place 1 drop into both eyes 2 (two) times daily.  5 mL  0  . levonorgestrel-ethinyl estradiol (SEASONALE) 0.15-0.03 MG per tablet Take 1 tablet by mouth daily.        . mesalamine (ROWASA) 4 G enema Place 60 mLs (4 g total) rectally at bedtime.  60 mL  0  . Multiple Vitamin (MULITIVITAMIN WITH MINERALS) TABS Take 1 tablet by mouth daily.      . valACYclovir (VALTREX)  500 MG tablet Take 500 mg by mouth daily as needed. For outbreaks.       No current facility-administered medications for this visit.   Family History  Problem Relation Age of Onset  . Ulcerative colitis Mother   . Hypertension Mother   . Diabetes Father   . Heart attack Father   . Hypertension Father   . Diabetes Sister   . Heart attack Maternal Grandmother    History   Social History  . Marital Status: Single    Spouse Name: N/A    Number of Children: N/A  . Years of Education: N/A   Social History Main Topics  . Smoking status: Never Smoker   . Smokeless tobacco: Never Used  . Alcohol Use: Yes     Comment: occasional beer and liquor on weekends  . Drug Use: None  . Sexual Activity: None    Other Topics Concern  . None   Social History Narrative  . None    Review of Systems: Full 14-point review of systems otherwise negative. See HPI.   Objective:  Physical Exam: Filed Vitals:   11/25/12 1554  BP: 123/78  Pulse: 74  Temp: 97.6 F (36.4 C)  TempSrc: Oral  Height: 5\' 3"  (1.6 m)  Weight: 165 lb (74.844 kg)  SpO2: 100%   Constitutional: Vital signs reviewed.  Patient is a well-developed and well-nourished, in no acute distress and cooperative with exam.   HEENT:  Head: Normocephalic and atraumatic Mouth: no erythema or exudates, MMM Eyes: PERRL, EOMI, conjunctivae normal, No scleral icterus.  Neck: Supple, Trachea midline normal ROM, No JVD Cardiovascular: RRR, S1 normal, S2 normal, no MRG, pulses symmetric and intact bilaterally Pulmonary/Chest: CTAB, no wheezes, rales, or rhonchi. There is tenderness over the front chest wall to palpation.   Abdominal: Soft. Non-tender, non-distended, bowel sounds are normal, no masses, organomegaly, or guarding present.  Musculoskeletal: No joint deformities, erythema, or stiffness, ROM full and non-tender Extremities: No leg edema. No swelling and tenderness over the calf area.  Hematology: no cervical, inginal, or axillary adenopathy.  Neurological: A&O x3, Strength is normal and symmetric bilaterally, cranial nerve II-XII are grossly intact, no focal motor deficit, sensory intact to light touch bilaterally.  Skin: Warm, dry and intact. No rash, cyanosis, or clubbing.  Psychiatric: Normal mood and affect. No suicidal or homicidal ideation.  Assessment & Plan:

## 2012-11-25 NOTE — Assessment & Plan Note (Signed)
Patient has pleuritic chest pain. Given her history of oral contraceptive pill use and elevated d-dimer, it is concerning that the patient may have pulmonary embolism. Other differential diagnoses include, but less likely, ACS (though patient has abnormal EKG with T wave inversion in inferior leads 3, it is not new. Her troponin is negative) and pneumonia (unlikely, no cough or fever or chills). Currently she is hemodynamically stable. Oxygen saturation is 100% on room air. I discussed with the Dr. Aundria Rud, we decided to transfer patient to the emergency department. Patient needs to have stat BMP, if renal function is OK, she may need CTA. Otherwise, she may need v/q scan.

## 2012-11-25 NOTE — ED Provider Notes (Signed)
CSN: [id number]     Arrival date & time 11/25/12  1811 History   First MD Initiated Contact with Patient 11/25/12 1838     Chief Complaint  Patient presents with  . Chest Pain   (Consider location/radiation/quality/duration/timing/severity/associated sxs/prior Treatment) Patient is a 37 y.o. female presenting with chest pain.  Chest Pain  Pt reports she had sudden onset of severe sharp midsternal chest pain about 4-5 days ago, woke her up from sleep.Has been persistent since then but less severe. Pain is worse with deep breath but no exertional component. No sOB, cough or fever. She was seen in PCP office today and had neg trop but positive d-dimer. She was sent to the ED for further evaluation of possible PE. She is currently on OCP but otherwise no PE risk factors.  Past Medical History  Diagnosis Date  . Asthma     mild by PFT's 03/16/1998  . Sciatica 01/12/06  . History of abnormal Pap smear     s/p cryo( Dr. Wiliam Ke)  . Cervical lymphadenopathy     biopsy negativeMarvetta Gibbons)  . Normal vaginal delivery     x3  . Complete spontaneous abortion     x2  . Hypertension   . HSV (herpes simplex virus) anogenital infection   . GERD (gastroesophageal reflux disease)   . 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    Past Surgical History  Procedure Laterality Date  . Biopsy of lymph node in r neck  2007.   Family History  Problem Relation Age of Onset  . Ulcerative colitis Mother   . Hypertension Mother   . Diabetes Father   . Heart attack Father   . Hypertension Father   . Diabetes Sister   . Heart attack Maternal Grandmother    History  Substance Use Topics  . Smoking status: Never Smoker   . Smokeless tobacco: Never Used  . Alcohol Use: Yes     Comment: occasional beer and liquor on weekends   OB History   Grav Para Term  Preterm Abortions TAB SAB Ect Mult Living                 Review of Systems  Cardiovascular: Positive for chest pain.   All other systems reviewed and are negative except as noted in HPI.   Allergies  Sulfamethoxazole w-trimethoprim and Amoxicillin  Home Medications   Current Outpatient Rx  Name  Route  Sig  Dispense  Refill  . albuterol (PROVENTIL HFA) 108 (90 BASE) MCG/ACT inhaler   Inhalation   Inhale 2 puffs into the lungs every 6 (six) hours as needed for wheezing.   1 Inhaler   3   . amLODipine (NORVASC) 2.5 MG tablet   Oral   Take 2.5 mg by mouth daily.         . cetirizine-pseudoephedrine (ZYRTEC-D) 5-120 MG per tablet   Oral   Take 1 tablet by mouth 2 (two) times daily.   30 tablet   0   . cyclobenzaprine (FLEXERIL) 5 MG tablet   Oral   Take 1 tablet (5 mg total) by mouth 3 (three) times daily as needed for muscle spasms.   30 tablet   0   . diclofenac sodium (VOLTAREN) 1 % GEL   Topical   Apply 2 g topically 4 (four) times daily.   2 Tube  2   . fluticasone (FLONASE) 50 MCG/ACT nasal spray   Nasal   Place 2 sprays into the nose daily as needed. For allergies.         Marland Kitchen ketotifen (ZADITOR) 0.025 % ophthalmic solution   Both Eyes   Place 1 drop into both eyes 2 (two) times daily.   5 mL   0   . levonorgestrel-ethinyl estradiol (SEASONALE) 0.15-0.03 MG per tablet   Oral   Take 1 tablet by mouth daily.           . mesalamine (ROWASA) 4 G enema   Rectal   Place 60 mLs (4 g total) rectally at bedtime.   60 mL   0   . Multiple Vitamin (MULITIVITAMIN WITH MINERALS) TABS   Oral   Take 1 tablet by mouth daily.         . valACYclovir (VALTREX) 500 MG tablet   Oral   Take 500 mg by mouth daily as needed. For outbreaks.          BP 142/100  Pulse 83  Temp(Src) 98.4 F (36.9 C) (Oral)  Resp 16  Ht 5\' 3"  (1.6 m)  Wt 163 lb 12.8 oz (74.3 kg)  BMI 29.02 kg/m2  SpO2 100%  LMP 10/05/2012 Physical Exam  Nursing note and vitals  reviewed. Constitutional: She is oriented to person, place, and time. She appears well-developed and well-nourished.  HENT:  Head: Normocephalic and atraumatic.  Eyes: EOM are normal. Pupils are equal, round, and reactive to light.  Neck: Normal range of motion. Neck supple.  Cardiovascular: Normal rate, normal heart sounds and intact distal pulses.   Pulmonary/Chest: Effort normal and breath sounds normal. No respiratory distress. She has no wheezes. She has no rales. She exhibits tenderness (mid-sternum).  Abdominal: Bowel sounds are normal. She exhibits no distension. There is no tenderness.  Musculoskeletal: Normal range of motion. She exhibits no edema and no tenderness.  Neurological: She is alert and oriented to person, place, and time. She has normal strength. No cranial nerve deficit or sensory deficit.  Skin: Skin is warm and dry. No rash noted.  Psychiatric: She has a normal mood and affect.    ED Course  Procedures (including critical care time) Labs Review Labs Reviewed  BASIC METABOLIC PANEL - Abnormal; Notable for the following:    GFR calc non Af Amer 86 (*)    All other components within normal limits   Imaging Review Ct Angio Chest Pe W/cm &/or Wo Cm  11/25/2012   CLINICAL DATA:  Chest pain  EXAM: CT ANGIOGRAPHY CHEST WITH CONTRAST  TECHNIQUE: Multidetector CT imaging of the chest was performed using the standard protocol during bolus administration of intravenous contrast. Multiplanar CT image reconstructions including MIPs were obtained to evaluate the vascular anatomy.  CONTRAST:  OMNIPAQUE IOHEXOL 350 MG/ML SOLN  COMPARISON:  Chest radiograph Jul 08, 2010  FINDINGS: There is no demonstrable pulmonary embolus. There is no thoracic aortic aneurysm or dissection.  Lungs are clear. There is no appreciable thoracic adenopathy. Pericardium is not thickened.  Visualized upper abdominal structures appear normal. There are no blastic or lytic bone lesions.  There is a 6 x 5  mm nodule in the left lobe of the thyroid.  Review of the MIP images confirms the above findings.  IMPRESSION: No edema or consolidation. No demonstrable pulmonary embolus. Small nodular lesion left lobe thyroid. No adenopathy.   Electronically Signed   By: Bretta Bang   On: 11/25/2012  20:34    MDM   1. Chest wall pain     EKG was done at PCP office. Reviewed and shows non-specific T wave changes unchanged from previous.   9:20 PM CT neg. Pt with chest wall pain, no concern for ACS. PCP followup.   Haleigh Desmith B. Bernette Mayers, MD 11/25/12 2121

## 2012-11-28 NOTE — Progress Notes (Signed)
Case discussed with Dr. Niu at the time of the visit.  We reviewed the resident's history and exam and pertinent patient test results.  I agree with the assessment, diagnosis, and plan of care documented in the resident's note.    

## 2013-01-06 ENCOUNTER — Telehealth: Payer: Self-pay | Admitting: *Deleted

## 2013-01-06 MED ORDER — MELOXICAM 15 MG PO TABS: 15.0000 mg | ORAL_TABLET | Freq: Every day | ORAL | Status: DC

## 2013-01-06 NOTE — Telephone Encounter (Signed)
Back has been hurting for two days now.  Even had to leave work because of it.  Sue [name] is wanting to know if anything can be called in to [facility] on [address]. We have not been prescribing anything except cyclobenzaprine which was last filled 09/12/12. She was last seen in September. She has an appt in December with K[facility] for SI injections.

## 2013-01-06 NOTE — Telephone Encounter (Signed)
Patient does not have a CSA with [place].

## 2013-01-06 NOTE — Telephone Encounter (Signed)
Notified by voicemail. Med ordered and instructions given.

## 2013-01-06 NOTE — Telephone Encounter (Signed)
Does she have a contract with [place] ? I saw that she got #15 percocets September 15th. We can prescribe Mobic 15mg  qd, # 30, BUT then make sure that she knows, that she should not take any other NSAIDs, she can also apply heat to her back

## 2013-02-16 ENCOUNTER — Ambulatory Visit (HOSPITAL_BASED_OUTPATIENT_CLINIC_OR_DEPARTMENT_OTHER): Payer: Medicaid Other | Admitting: Physical Medicine & Rehabilitation

## 2013-02-16 ENCOUNTER — Encounter: Payer: Self-pay | Admitting: Physical Medicine & Rehabilitation

## 2013-02-16 ENCOUNTER — Encounter: Payer: Medicaid Other | Attending: Physical Medicine & Rehabilitation

## 2013-02-16 VITALS — BP 138/81 | HR 87 | Resp 14 | Ht 63.0 in | Wt 168.0 lb

## 2013-02-16 DIAGNOSIS — M461 Sacroiliitis, not elsewhere classified: Secondary | ICD-10-CM

## 2013-02-16 DIAGNOSIS — M545 Low back pain, unspecified: Secondary | ICD-10-CM | POA: Insufficient documentation

## 2013-02-16 DIAGNOSIS — R209 Unspecified disturbances of skin sensation: Secondary | ICD-10-CM | POA: Insufficient documentation

## 2013-02-16 DIAGNOSIS — M129 Arthropathy, unspecified: Secondary | ICD-10-CM | POA: Insufficient documentation

## 2013-02-16 DIAGNOSIS — G8929 Other chronic pain: Secondary | ICD-10-CM | POA: Insufficient documentation

## 2013-02-16 DIAGNOSIS — M25559 Pain in unspecified hip: Secondary | ICD-10-CM | POA: Insufficient documentation

## 2013-02-16 MED ORDER — MELOXICAM 15 MG PO TABS: 15.0000 mg | ORAL_TABLET | Freq: Every day | ORAL | Status: DC

## 2013-02-16 NOTE — Progress Notes (Signed)
  PROCEDURE RECORD [facility]   Name: Sue [name] DOB:1975/05/04 MRN: [id number]  Date:02/16/2013  Physician: Claudette Laws, MD    Nurse/CMA: Kelli Churn, CMA  Allergies:  Allergies  Allergen Reactions  . Sulfamethoxazole-Trimethoprim     REACTION: septra--doesn't recall what type of reaction  . Amoxicillin Rash    Consent Signed: yes  Is patient diabetic? no    Pregnant: no LMP: Patient's last menstrual period was 01/08/2013. (age 60-55)  Anticoagulants: no Anti-inflammatory: no Antibiotics: no  Procedure: sacroliliac injection  Position: Prone Start Time:  930 End Time: 936  Fluoro Time: 19  RN/CMA Kemara Quigley,CMA Kaysa Roulhac,CMA    Time 915 939    BP 138/81 143/86    Pulse 87 78    Respirations 14 14    O2 Sat 100 100    S/S 6 6    Pain Level 3/10 0/10     D/C home with Son, patient A & O X 3, D/C instructions reviewed, and sits independently.

## 2013-02-16 NOTE — Patient Instructions (Addendum)
Celestone and lidocaine and omnipaque injected

## 2013-02-16 NOTE — Progress Notes (Signed)
1. 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 Pain has persisted despite conservative management will inject sacroiliac joints today  Bilateral sacroiliac injections under fluoroscopic guidance  Indication: Low back and buttocks pain not relieved by medication management and other conservative care.  Informed consent was obtained after describing risks and benefits of the procedure with the patient, this includes bleeding, bruising, infection, paralysis and medication side effects. The patient wishes to proceed and has given written consent. The patient was placed in a prone position. The lumbar and sacral area was marked and prepped with Betadine. A 25-gauge 1-1/2 inch needle was inserted into the skin and subcutaneous tissue and 1 mL of 1% lidocaine was injected into each side. Then a 25-gauge 3 inch spinal needle was inserted under fluoroscopic guidance into the left sacroiliac joint. AP and lateral images were utilized. Omnipaque 180x0.5 mL under live fluoroscopy demonstrated no intravascular uptake. Then a solution containing one ML of 40 mg per mL depomedrol in 2 ML of 2% lidocaine MPF was injected x1.5 mL. This same procedure was repeated on the right side using the same needle, injectate, and technique. Patient tolerated the procedure well. Post procedure instructions were given. Please see post procedure form.Right anterior capsular rent was noted

## 2013-03-24 ENCOUNTER — Other Ambulatory Visit: Payer: Self-pay | Admitting: *Deleted

## 2013-03-24 ENCOUNTER — Encounter: Payer: Self-pay | Admitting: Internal Medicine

## 2013-03-24 ENCOUNTER — Ambulatory Visit (INDEPENDENT_AMBULATORY_CARE_PROVIDER_SITE_OTHER): Payer: Medicaid Other | Admitting: Internal Medicine

## 2013-03-24 VITALS — BP 137/92 | HR 90 | Temp 98.2°F | Ht 63.0 in | Wt 170.3 lb

## 2013-03-24 DIAGNOSIS — J309 Allergic rhinitis, unspecified: Secondary | ICD-10-CM

## 2013-03-24 DIAGNOSIS — R0602 Shortness of breath: Secondary | ICD-10-CM

## 2013-03-24 DIAGNOSIS — I10 Essential (primary) hypertension: Secondary | ICD-10-CM

## 2013-03-24 DIAGNOSIS — J45901 Unspecified asthma with (acute) exacerbation: Secondary | ICD-10-CM

## 2013-03-24 DIAGNOSIS — Z23 Encounter for immunization: Secondary | ICD-10-CM

## 2013-03-24 MED ORDER — GUAIFENESIN ER 600 MG PO TB12: 600.0000 mg | ORAL_TABLET | Freq: Two times a day (BID) | ORAL | Status: DC

## 2013-03-24 MED ORDER — ALBUTEROL SULFATE HFA 108 (90 BASE) MCG/ACT IN AERS: 2.0000 | INHALATION_SPRAY | Freq: Four times a day (QID) | RESPIRATORY_TRACT | Status: DC | PRN

## 2013-03-24 MED ORDER — PREDNISONE 20 MG PO TABS: 40.0000 mg | ORAL_TABLET | Freq: Every day | ORAL | Status: DC

## 2013-03-24 MED ORDER — DEXTROMETHORPHAN HBR 15 MG/5ML PO SYRP: 10.0000 mL | ORAL_SOLUTION | Freq: Four times a day (QID) | ORAL | Status: DC | PRN

## 2013-03-24 MED ORDER — CETIRIZINE HCL 10 MG PO CAPS: 10.0000 mg | ORAL_CAPSULE | Freq: Every evening | ORAL | Status: DC | PRN

## 2013-03-24 NOTE — Progress Notes (Signed)
Case discussed with Dr. Glenn soon after the resident saw the patient.  We reviewed the resident's history and exam and pertinent patient test results.  I agree with the assessment, diagnosis, and plan of care documented in the resident's note. 

## 2013-03-24 NOTE — Telephone Encounter (Signed)
Pt called for appt today - problems this week with clear productive cough and asthma. Out of albuterol inhaler. Pt has not checked temperature. No appt available in clinic today - suggest for pt to go to ER or Urgent Care - has Medicaid. Saw Dr Clyde Lundborg in clinic 11/2012.  Stanton Kidney Juletta Berhe RN 03/24/13 11AM

## 2013-03-24 NOTE — Progress Notes (Signed)
Patient ID: Sue [name], female   DOB: [birth year], 38 y.o.   MRN: [id number]  Subjective:   Patient ID: Sue [name] female   DOB: 1975/08/07 38 y.o.   MRN: [id number]  HPI: [name] is a 38 y.o. F w/ PMH HTN and asthma presents today for an acute visit for a cough.  Pt with a productive cough with clear mucus x1 week. She has a h/o asthma and ran out of her Albuterol inhaler a few moths ago. She states that she has similar symptoms this time each year.  She does endorse wheezing at night and SOB that is worse at night x1 week.   In addition to her cough, she is also experiencing nasal congestion, runny nose, and postnasal drainage that has been going on for the past week as well. She states she has a history of allergic rhinitis and takes Zyrtec-D intermittently, but has not used it over the past few months. She states that this is her worst time of year for these symptoms.  She has a h/o hypertension, and is supposed to be taking Norvasc 2.5 mg daily. However she states she takes it maybe every other day because she feels it makes her drowsy at times. She states that she knows she should be taking it daily.  She denies fevers or chills. She denies headaches, vision changes, ear pain/drainage, abdominal, N/V, diarrhea, constipation, dysuria, myalgias or arthralgias.   Past Medical History  Diagnosis Date  . Asthma     mild by PFT's 03/16/1998  . Sciatica 01/12/06  . History of abnormal Pap smear     s/p cryo( Dr. Wiliam Ke)  . Cervical lymphadenopathy     biopsy negativeMarvetta Gibbons)  . Normal vaginal delivery     x3  . Complete spontaneous abortion     x2  . Hypertension   . HSV (herpes simplex virus) anogenital infection   . GERD (gastroesophageal reflux disease)   . 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    Current Outpatient  Prescriptions  Medication Sig Dispense Refill  . albuterol (PROVENTIL HFA) 108 (90 BASE) MCG/ACT inhaler Inhale 2 puffs into the lungs every 6 (six) hours as needed for wheezing.  1 Inhaler  3  . amLODipine (NORVASC) 2.5 MG tablet Take 2.5 mg by mouth daily.      . cetirizine-pseudoephedrine (ZYRTEC-D) 5-120 MG per tablet Take 1 tablet by mouth 2 (two) times daily as needed for allergies.      Marland Kitchen diclofenac sodium (VOLTAREN) 1 % GEL Apply 2 g topically 4 (four) times daily as needed (pain).      Marland Kitchen ibuprofen (ADVIL,MOTRIN) 200 MG tablet Take 600 mg by mouth every 6 (six) hours as needed for pain.      Marland Kitchen ibuprofen (ADVIL,MOTRIN) 800 MG tablet Take 1 tablet (800 mg total) by mouth 3 (three) times daily.  21 tablet  0  . levonorgestrel-ethinyl estradiol (SEASONALE) 0.15-0.03 MG per tablet Take 1 tablet by mouth daily.        . meloxicam (MOBIC) 15 MG tablet Take 1 tablet (15 mg total) by mouth daily.  30 tablet  1  . Multiple Vitamin (MULITIVITAMIN WITH MINERALS) TABS Take 1 tablet by mouth daily.      Marland Kitchen oxyCODONE-acetaminophen (PERCOCET/ROXICET) 5-325 MG per tablet Take 2 tablets by mouth every 4 (four) hours as needed  for pain.  15 tablet  0  . valACYclovir (VALTREX) 500 MG tablet Take 500 mg by mouth daily as needed. For outbreaks.       No current facility-administered medications for this visit.   Family History  Problem Relation Age of Onset  . Ulcerative colitis Mother   . Hypertension Mother   . Diabetes Father   . Heart attack Father   . Hypertension Father   . Diabetes Sister   . Heart attack Maternal Grandmother    History   Social History  . Marital Status: Single    Spouse Name: N/A    Number of Children: N/A  . Years of Education: N/A   Social History Main Topics  . Smoking status: Never Smoker   . Smokeless tobacco: Never Used  . Alcohol Use: Yes     Comment: occasional beer and liquor on weekends  . Drug Use: No  . Sexual Activity: None   Other Topics Concern  .  None   Social History Narrative  . None   Review of Systems: A 12 point ROS was performed; pertinent positives and negatives were noted in the HPI   Objective:  Physical Exam: Filed Vitals:   03/24/13 1448  BP: 137/92  Pulse: 90  Temp: 98.2 F (36.8 C)  TempSrc: Oral  Height: 5\' 3"  (1.6 m)  Weight: 170 lb 4.8 oz (77.248 kg)  SpO2: 100%   Constitutional: Vital signs reviewed.  Patient is a well-developed and well-nourished female in no acute distress and cooperative with exam.  Head: Normocephalic and atraumatic Ear: Clear fluid posterior to TMs bilaterally Nose: +erythema with edema of the turbinates with clear drainage noted bilaterally.  Mouth:+erythema and mild cobblestoning of the posterior oropharynx. No exudates, MMM Eyes: PERRL, EOMI. No scleral icterus.  Neck: Supple, Trachea midline, normal ROM, No JVD, mass, thyromegaly Cardiovascular: Regular rhythm. Mild tachycardia. No MRG, pulses symmetric and intact bilaterally Pulmonary/Chest: Normal respiratory effort, CTAB, no wheezes, rales, or rhonchi Abdominal: Soft. Non-tender, non-distended Musculoskeletal: No joint deformities, erythema, or stiffness, ROM full  Hematology: No cervical adenopathy.  Neurological: A&O x3, Strength is normal and symmetric bilaterally, cranial nerve II-XII are grossly intact, no focal motor deficit, sensory intact to light touch bilaterally.  Skin: Warm, dry and intact.   Psychiatric: Normal mood and affect. Speech and behavior is normal.   Assessment & Plan:   Please refer to Problem List based Assessment and Plan

## 2013-03-24 NOTE — Patient Instructions (Addendum)
Take the Prednisone 40mg  (2 tablets) daily for 3 days.  Use the albuterol inhaler 2 puffs every 4-6 hours for the next 3 days and then use as needed.   Take the Zyrtec once daily- this may cause drowsiness, so you can take it at night before bed.   Take the Mucinex 2 times a day as needed for congestion and drainage.  Take the cough medicine up to 4 times a day as needed for your cough.  **if your symptoms do not improve over the weekend, please call the clinic.

## 2013-03-24 NOTE — Assessment & Plan Note (Signed)
BP Readings from Last 3 Encounters:  03/24/13 137/92  02/16/13 138/81  11/25/12 137/114    Lab Results  Component Value Date   NA 137 11/25/2012   K 3.6 11/25/2012   CREATININE 0.86 11/25/2012    Assessment: Blood pressure control: mildly elevated Progress toward BP goal:  unchanged Comments: Pt coughing today, which is likely contributing to her mildly elevated blood pressure. However, she is only taking her amlodipine qoday.   Plan: Medications:  continue current medications, pt advised to take her amlodipine daily Educational resources provided: brochure;handout;video Self management tools provided:   Other plans: F/u in 3 months for repeat BP check- if she continues to have fatigue with her amlodipine, may need to change to a different medication.

## 2013-03-24 NOTE — Assessment & Plan Note (Signed)
Patient states she has a history of allergic rhinitis and asthma that is usually well controlled except this time a year, when she states that she always has a flare of her asthma and allergic rhinitis. She states she ran out of her albuterol inhaler a few months ago and has not been taking her Zyrtec-D. She endorses runny nose, nasal congestion, PND, and a productive cough with clear mucus x1 week. She denies any fevers, chills, or sick contacts. On exam her lungs are clear, her oxygen saturation is 100%, she does have erythema, edema, and clear mucus in the nares, and has mild cobblestoning in her posterior oropharynx, but no purulence is noted. She does have fluid behind the TM bilaterally. Patient advised to stop the Zyrtec-D secondary to her history of hypertension, and will start regular Zyrtec and Mucinex. Will treat with systemic prednisone for a few days as a "push" to reduce inflammation in the nose and lungs.  - Albuterol inhaler 2 puffs q4-6 hours x3 days, an then as needed - Prednisone 40mg  daily x3 days - Zyrtec 10mg  daily - Mucinex 600mg  BID - Dextromethorphan 10 mL QID PRN cough

## 2013-03-24 NOTE — Assessment & Plan Note (Addendum)
Patient states she has a history of allergic rhinitis and asthma that is usually well controlled except this time a year, when she states that she always has a flare of her asthma and allergic rhinitis. She states she ran out of her albuterol inhaler a few months ago and has not been taking her Zyrtec-D. She endorses runny nose, nasal congestion, PND, and a productive cough with clear mucus x1 week. She denies any fevers, chills, or sick contacts. On exam her lungs are clear, her oxygen saturation is 100%, she does have erythema, edema, and clear mucus in the nares, and has mild cobblestoning in her posterior oropharynx, but no purulence is noted. She does have fluid behind the TM bilaterally. Will treat with systemic prednisone for a few days as a "push" to reduce inflammation in the nose and lungs. - Albuterol inhaler 2 puffs q4-6 hours x3 days, an then as needed - Prednisone 40mg  daily x3 days - Zyrtec 10mg  daily - Mucinex 600mg  BID - Dextromethorphan 10 mL QID PRN cough

## 2013-03-28 NOTE — Telephone Encounter (Signed)
Done by Dr Sherrine Maples 03/24/13.

## 2013-03-29 MED ORDER — ALBUTEROL SULFATE HFA 108 (90 BASE) MCG/ACT IN AERS: 2.0000 | INHALATION_SPRAY | Freq: Four times a day (QID) | RESPIRATORY_TRACT | Status: DC | PRN

## 2013-05-18 ENCOUNTER — Encounter: Payer: Self-pay | Admitting: Physical Medicine & Rehabilitation

## 2013-05-18 ENCOUNTER — Ambulatory Visit (HOSPITAL_BASED_OUTPATIENT_CLINIC_OR_DEPARTMENT_OTHER): Payer: Medicaid Other | Admitting: Physical Medicine & Rehabilitation

## 2013-05-18 ENCOUNTER — Encounter: Payer: Medicaid Other | Attending: Physical Medicine & Rehabilitation

## 2013-05-18 VITALS — BP 133/78 | HR 79 | Resp 14 | Ht 63.0 in | Wt 171.0 lb

## 2013-05-18 DIAGNOSIS — R209 Unspecified disturbances of skin sensation: Secondary | ICD-10-CM | POA: Insufficient documentation

## 2013-05-18 DIAGNOSIS — M533 Sacrococcygeal disorders, not elsewhere classified: Secondary | ICD-10-CM

## 2013-05-18 DIAGNOSIS — M129 Arthropathy, unspecified: Secondary | ICD-10-CM | POA: Insufficient documentation

## 2013-05-18 DIAGNOSIS — M545 Low back pain, unspecified: Secondary | ICD-10-CM | POA: Insufficient documentation

## 2013-05-18 DIAGNOSIS — M25559 Pain in unspecified hip: Secondary | ICD-10-CM | POA: Insufficient documentation

## 2013-05-18 DIAGNOSIS — G8929 Other chronic pain: Secondary | ICD-10-CM | POA: Insufficient documentation

## 2013-05-18 NOTE — Progress Notes (Signed)
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 Pain has persisted despite conservative management will inject sacroiliac joints today  Bilateral sacroiliac injections under fluoroscopic guidance  Indication: Low back and buttocks pain not relieved by medication management and other conservative care.  Informed consent was obtained after describing risks and benefits of the procedure with the patient, this includes bleeding, bruising, infection, paralysis and medication side effects. The patient wishes to proceed and has given written consent. The patient was placed in a prone position. The lumbar and sacral area was marked and prepped with Betadine. A 25-gauge 1-1/2 inch needle was inserted into the skin and subcutaneous tissue and 1 mL of 1% lidocaine was injected into each side. Then a 25-gauge 3 inch spinal needle was inserted under fluoroscopic guidance into the left sacroiliac joint. AP and lateral images were utilized. Omnipaque 180x0.5 mL under live fluoroscopy demonstrated no intravascular uptake. Then a solution containing one ML of 40 mg per mL depomedrol in 2 ML of 2% lidocaine MPF was injected x1.5 mL. This same procedure was repeated on the right side using the same needle, injectate, and technique. Patient tolerated the procedure well. Post procedure instructions were given. Please see post procedure form.Right anterior capsular rent was noted

## 2013-05-18 NOTE — Progress Notes (Signed)
  PROCEDURE RECORD [facility]   Name: Sue [name] DOB:01-08-1976 MRN: [id number]  Date:05/18/2013  Physician: Claudette Laws, MD    Nurse/CMA: Shion Bluestein CMA/Levens CMA  Allergies:  Allergies  Allergen Reactions  . Sulfamethoxazole-Trimethoprim     REACTION: septra--doesn't recall what type of reaction  . Amoxicillin Rash    Consent Signed: yes  Is patient diabetic? no  CBG today?   Pregnant: no LMP: No LMP recorded. (age 45-55)  Anticoagulants: no Anti-inflammatory: no Antibiotics: no  Procedure: Bilateral Sacroiliac Steroid Injection Position: Prone Start Time: 1004a End Time: 1009 Fluoro Time: 14  RN/CMA Larua Collier CMA Levens CMA    Time 9:38 1011    BP 133/78 140/83    Pulse 79 81    Respirations 14 14    O2 Sat 100 100    S/S 6 6    Pain Level 4/10 3/10     D/C home with Markec Farner(son), patient A & O X 3, D/C instructions reviewed, and sits independently.

## 2013-05-18 NOTE — Patient Instructions (Signed)
Sacroiliac injection  Bilateral Celestone and lidocaine

## 2013-05-30 ENCOUNTER — Telehealth: Payer: Self-pay

## 2013-05-30 NOTE — Telephone Encounter (Signed)
Patient called regarding her back after her injection.  She is just getting feeling back in her leg.  She is concerned about her scares on her back.  The spots seem like ringworm.  Patient denies itching.  They are really dry spots.  She has cleaned the area with alcohol/peroxide.  She is concerned that she is having a reaction to something.  Patient denies iodine allergy.  Please advise.

## 2013-05-30 NOTE — Telephone Encounter (Signed)
I don't think it is related to injection, it is almost 2 weeks since and all the meds would be out of her system by now I would be happy to evaluate or she can go to PCP

## 2013-05-31 NOTE — Telephone Encounter (Signed)
Left message for patient to call and be evaluated or contact PCP.

## 2013-06-22 ENCOUNTER — Telehealth: Payer: Self-pay

## 2013-06-22 DIAGNOSIS — G8929 Other chronic pain: Secondary | ICD-10-CM

## 2013-06-22 DIAGNOSIS — M545 Low back pain, unspecified: Secondary | ICD-10-CM

## 2013-06-22 NOTE — Telephone Encounter (Signed)
Order MRI of Lumbar spine

## 2013-06-22 NOTE — Telephone Encounter (Signed)
Patient states she is having increased lower back pain. She said her pain level is a 8, and the Meloxicam is not helping. Patient is requesting a different medication to help wit the pain. Please advise.

## 2013-06-23 ENCOUNTER — Telehealth: Payer: Self-pay

## 2013-06-23 NOTE — Telephone Encounter (Signed)
Patient called back requetsing something for pain.  Her MRI is scheduled.

## 2013-06-23 NOTE — Telephone Encounter (Signed)
Patient aware of MRI order.

## 2013-06-23 NOTE — Telephone Encounter (Signed)
Patient says that meloxicam is not helping.  She has tried icy hot and ibuprofen but it does not seem to be helping.  She is going to try heat and ice over the weekend and is waiting on other recommendations.  She is aware Dr Bryson Dames is out of the office.

## 2013-06-26 MED ORDER — TRAMADOL-ACETAMINOPHEN 37.5-325 MG PO TABS: 1.0000 | ORAL_TABLET | Freq: Three times a day (TID) | ORAL | Status: DC | PRN

## 2013-06-26 NOTE — Telephone Encounter (Signed)
May call in ultracet #90 1 po q8h prn

## 2013-06-26 NOTE — Telephone Encounter (Signed)
Notified Sue [name] rx had been called to pharmacy

## 2013-06-29 ENCOUNTER — Inpatient Hospital Stay: Admission: RE | Admit: 2013-06-29 | Payer: Medicaid Other | Source: Ambulatory Visit

## 2013-06-30 ENCOUNTER — Telehealth: Payer: Self-pay

## 2013-06-30 NOTE — Telephone Encounter (Signed)
Left message advising patient to follow up with her PCP or sports med provider regarding her pain.

## 2013-06-30 NOTE — Telephone Encounter (Signed)
Patient MRI had to be rescheduled due to insurance reasons.  She has tried the tramadol but it made her sick and uncomfortable.  She would like to know her other options.  Please advise.

## 2013-06-30 NOTE — Telephone Encounter (Signed)
We have been consulted by sports med to do blocks (injection only referral) rather than med management, pt should call sports med or PCP for meds

## 2013-07-17 ENCOUNTER — Ambulatory Visit
Admission: RE | Admit: 2013-07-17 | Discharge: 2013-07-17 | Disposition: A | Discharge status: Home / Self Care | Payer: Medicaid Other | Source: Ambulatory Visit | Attending: Physical Medicine & Rehabilitation | Admitting: Physical Medicine & Rehabilitation

## 2013-07-17 DIAGNOSIS — M545 Low back pain, unspecified: Secondary | ICD-10-CM

## 2013-07-17 DIAGNOSIS — G8929 Other chronic pain: Secondary | ICD-10-CM

## 2013-08-07 ENCOUNTER — Encounter: Payer: Self-pay | Admitting: Obstetrics

## 2013-08-07 ENCOUNTER — Ambulatory Visit (INDEPENDENT_AMBULATORY_CARE_PROVIDER_SITE_OTHER): Payer: Medicaid Other | Admitting: Obstetrics

## 2013-08-07 VITALS — BP 143/87 | HR 76 | Temp 98.8°F | Ht 64.0 in | Wt 173.0 lb

## 2013-08-07 DIAGNOSIS — I10 Essential (primary) hypertension: Secondary | ICD-10-CM

## 2013-08-07 DIAGNOSIS — Z113 Encounter for screening for infections with a predominantly sexual mode of transmission: Secondary | ICD-10-CM

## 2013-08-07 DIAGNOSIS — Z0000 Encounter for general adult medical examination without abnormal findings: Secondary | ICD-10-CM

## 2013-08-07 MED ORDER — AMLODIPINE BESYLATE 5 MG PO TABS: 5.0000 mg | ORAL_TABLET | Freq: Every day | ORAL | Status: DC

## 2013-08-07 NOTE — Progress Notes (Signed)
Subjective:     Sue [name] is a 38 y.o. female here for a routine exam.  Current complaints: BTB this past month.    Personal health questionnaire:  Is patient [name], have a family history of breast and/or ovarian cancer: no Is there a family history of uterine cancer diagnosed at age < 46, gastrointestinal cancer, urinary tract cancer, family member who is a Personnel officer syndrome-associated carrier: no Is the patient overweight and hypertensive, family history of diabetes, personal history of gestational diabetes or PCOS: no Is patient over 64, have PCOS,  family history of premature CHD under age 76, diabetes, smoke, have hypertension or peripheral artery disease:  no  The HPI was reviewed and explored in further detail by the provider. Gynecologic History Patient's last menstrual period was 04/07/2013. Contraception: OCP (estrogen/progesterone) Last Pap: 2014. Results were: normal Last mammogram: n/a. Results were: n/a  Obstetric History OB History  Gravida Para Term Preterm AB SAB TAB Ectopic Multiple Living  6 3 2 1 3 3    3     # Outcome Date GA Lbr Len/2nd Weight Sex Delivery Anes PTL Lv  6 TRM 07/06/05 [redacted]w[redacted]d  6 lb 12 oz (3.062 kg) M SVD None  Y  5 SAB 2006 [redacted]w[redacted]d       N  4 TRM 03/27/96 [redacted]w[redacted]d  7 lb 12 oz (3.515 kg) M SVD None  Y  3 SAB 1996        N  2 PRE 02/17/92 [redacted]w[redacted]d  3 lb 11 oz (1.673 kg) M SVD None  Y  1 SAB 1992        N      Past Medical History  Diagnosis Date  . Asthma     mild by PFT's 03/16/1998  . Sciatica 01/12/06  . History of abnormal Pap smear     s/p cryo( Dr. 13/6/07)  . Cervical lymphadenopathy     biopsy negativeWiliam Ke)  . Normal vaginal delivery     x3  . Complete spontaneous abortion     x2  . Hypertension   . HSV (herpes simplex virus) anogenital infection   . GERD (gastroesophageal reflux disease)   . 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     Past Surgical History  Procedure Laterality Date  . Biopsy of lymph node in r neck  2007.    Current outpatient prescriptions:albuterol (PROVENTIL HFA) 108 (90 BASE) MCG/ACT inhaler, Inhale 2 puffs into the lungs every 6 (six) hours as needed for wheezing., Disp: 1 Inhaler, Rfl: 6;  Cetirizine HCl 10 MG CAPS, Take 1 capsule (10 mg total) by mouth at bedtime as needed., Disp: 30 capsule, Rfl: 3;  ibuprofen (ADVIL,MOTRIN) 200 MG tablet, Take 600 mg by mouth every 6 (six) hours as needed for pain., Disp: , Rfl:  levonorgestrel-ethinyl estradiol (SEASONALE) 0.15-0.03 MG per tablet, Take 1 tablet by mouth daily.  , Disp: , Rfl: ;  meloxicam (MOBIC) 15 MG tablet, Take 1 tablet (15 mg total) by mouth daily., Disp: 30 tablet, Rfl: 1;  Multiple Vitamin (MULITIVITAMIN WITH MINERALS) TABS, Take 1 tablet by mouth daily., Disp: , Rfl: ;  valACYclovir (VALTREX) 500 MG tablet, Take 500 mg by mouth daily as needed. For outbreaks., Disp: , Rfl:  amLODipine (NORVASC) 5 MG tablet, Take 1 tablet (5 mg total) by mouth daily., Disp: 30 tablet, Rfl:  11;  diclofenac sodium (VOLTAREN) 1 % GEL, Apply 2 g topically 4 (four) times daily as needed (pain)., Disp: , Rfl:  Allergies  Allergen Reactions  . Sulfamethoxazole-Trimethoprim     REACTION: septra--doesn't recall what type of reaction  . Amoxicillin Rash    History  Substance Use Topics  . Smoking status: Never Smoker   . Smokeless tobacco: Never Used  . Alcohol Use: Yes     Comment: occasional beer and liquor on weekends    Family History  Problem Relation Age of Onset  . Ulcerative colitis Mother   . Hypertension Mother   . Diabetes Father   . Heart attack Father   . Hypertension Father   . Diabetes Sister   . Heart attack Maternal Grandmother       Review of Systems  Constitutional: negative for fatigue and weight loss Respiratory: negative for cough and wheezing Cardiovascular: negative  for chest pain, fatigue and palpitations Gastrointestinal: negative for abdominal pain and change in bowel habits Musculoskeletal:negative for myalgias Neurological: negative for gait problems and tremors Behavioral/Psych: negative for abusive relationship, depression Endocrine: negative for temperature intolerance   Genitourinary:negative for abnormal menstrual periods, genital lesions, hot flashes, sexual problems and vaginal discharge Integument/breast: negative for breast lump, breast tenderness, nipple discharge and skin lesion(s)    Objective:       BP 143/87  Pulse 76  Temp(Src) 98.8 F (37.1 C)  Ht 5\' 4"  (1.626 m)  Wt 173 lb (78.472 kg)  BMI 29.68 kg/m2  LMP 04/07/2013 General:   alert  Skin:   no rash or abnormalities  Lungs:   clear to auscultation bilaterally  Heart:   regular rate and rhythm, S1, S2 normal, no murmur, click, rub or gallop  Breasts:   normal without suspicious masses, skin or nipple changes or axillary nodes  Abdomen:  normal findings: no organomegaly, soft, non-tender and no hernia  Pelvis:  External genitalia: normal general appearance Urinary system: urethral meatus normal and bladder without fullness, nontender Vaginal: normal without tenderness, induration or masses Cervix: normal appearance Adnexa: normal bimanual exam Uterus: anteverted and non-tender, normal size   Lab Review Urine pregnancy test Labs reviewed yes Radiologic studies reviewed no    Assessment:    Healthy female exam.   Hypertension.  Stable on Norvasc.  Dose increased to 5 mg daily.  Contraceptive Surveillance Plan:    Education reviewed: safe sex/STD prevention and self breast exams. Follow up in: 1 year.   Meds ordered this encounter  Medications  . amLODipine (NORVASC) 5 MG tablet    Sig: Take 1 tablet (5 mg total) by mouth daily.    Dispense:  30 tablet    Refill:  11   Orders Placed This Encounter  Procedures  . HIV antibody  . Hepatitis B surface  antigen  . RPR  . Hepatitis C antibody  . CBC with Differential  . Comprehensive metabolic panel  . TSH    Possible management options include:  May need endometrial biopsy and U/S if AUB continues.

## 2013-08-07 NOTE — Addendum Note (Signed)
Addended by: Odessa Fleming on: 08/07/2013 05:28 PM   Modules accepted: Orders

## 2013-08-08 LAB — COMPREHENSIVE METABOLIC PANEL
ALT: 17 U/L (ref 0–35)
AST: 16 U/L (ref 0–37)
Albumin: 3.7 g/dL (ref 3.5–5.2)
Alkaline Phosphatase: 65 U/L (ref 39–117)
BUN: 10 mg/dL (ref 6–23)
CO2: 25 mEq/L (ref 19–32)
Calcium: 9 mg/dL (ref 8.4–10.5)
Chloride: 102 mEq/L (ref 96–112)
Creat: 0.79 mg/dL (ref 0.50–1.10)
Glucose, Bld: 101 mg/dL — ABNORMAL HIGH (ref 70–99)
Potassium: 4.2 mEq/L (ref 3.5–5.3)
Sodium: 134 mEq/L — ABNORMAL LOW (ref 135–145)
Total Bilirubin: 0.2 mg/dL (ref 0.2–1.2)
Total Protein: 7.1 g/dL (ref 6.0–8.3)

## 2013-08-08 LAB — CBC WITH DIFFERENTIAL/PLATELET
Basophils Absolute: 0.1 10*3/uL (ref 0.0–0.1)
Basophils Relative: 1 % (ref 0–1)
Eosinophils Absolute: 0.2 10*3/uL (ref 0.0–0.7)
Eosinophils Relative: 4 % (ref 0–5)
HCT: 38.7 % (ref 36.0–46.0)
Hemoglobin: 13.2 g/dL (ref 12.0–15.0)
Lymphocytes Relative: 45 % (ref 12–46)
Lymphs Abs: 2.3 10*3/uL (ref 0.7–4.0)
MCH: 29.5 pg (ref 26.0–34.0)
MCHC: 34.1 g/dL (ref 30.0–36.0)
MCV: 86.4 fL (ref 78.0–100.0)
Monocytes Absolute: 0.4 10*3/uL (ref 0.1–1.0)
Monocytes Relative: 7 % (ref 3–12)
Neutro Abs: 2.2 10*3/uL (ref 1.7–7.7)
Neutrophils Relative %: 43 % (ref 43–77)
Platelets: 344 10*3/uL (ref 150–400)
RBC: 4.48 MIL/uL (ref 3.87–5.11)
RDW: 13.7 % (ref 11.5–15.5)
WBC: 5.2 10*3/uL (ref 4.0–10.5)

## 2013-08-08 LAB — HIV ANTIBODY (ROUTINE TESTING W REFLEX): HIV 1&2 Ab, 4th Generation: NONREACTIVE

## 2013-08-08 LAB — TSH: TSH: 0.497 u[IU]/mL (ref 0.350–4.500)

## 2013-08-08 LAB — RPR

## 2013-08-08 LAB — HEPATITIS C ANTIBODY: HCV Ab: NEGATIVE

## 2013-08-08 LAB — HEPATITIS B SURFACE ANTIGEN: Hepatitis B Surface Ag: NEGATIVE

## 2013-08-09 LAB — GC/CHLAMYDIA PROBE AMP
CT Probe RNA: NEGATIVE
GC Probe RNA: NEGATIVE

## 2013-08-09 LAB — PAP IG AND HPV HIGH-RISK: HPV DNA High Risk: DETECTED — AB

## 2013-08-09 LAB — WET PREP BY MOLECULAR PROBE
Candida species: NEGATIVE
Gardnerella vaginalis: NEGATIVE
Trichomonas vaginosis: NEGATIVE

## 2013-08-17 ENCOUNTER — Ambulatory Visit (HOSPITAL_BASED_OUTPATIENT_CLINIC_OR_DEPARTMENT_OTHER): Payer: Medicaid Other | Admitting: Physical Medicine & Rehabilitation

## 2013-08-17 ENCOUNTER — Encounter: Payer: Self-pay | Admitting: Physical Medicine & Rehabilitation

## 2013-08-17 ENCOUNTER — Encounter: Payer: Medicaid Other | Attending: Physical Medicine & Rehabilitation

## 2013-08-17 VITALS — BP 136/87 | HR 76 | Resp 14 | Ht 64.0 in | Wt 171.0 lb

## 2013-08-17 DIAGNOSIS — M545 Low back pain, unspecified: Secondary | ICD-10-CM | POA: Insufficient documentation

## 2013-08-17 DIAGNOSIS — G8929 Other chronic pain: Secondary | ICD-10-CM | POA: Insufficient documentation

## 2013-08-17 DIAGNOSIS — M25559 Pain in unspecified hip: Secondary | ICD-10-CM | POA: Diagnosis not present

## 2013-08-17 DIAGNOSIS — M129 Arthropathy, unspecified: Secondary | ICD-10-CM | POA: Diagnosis not present

## 2013-08-17 DIAGNOSIS — R209 Unspecified disturbances of skin sensation: Secondary | ICD-10-CM | POA: Diagnosis not present

## 2013-08-17 DIAGNOSIS — M461 Sacroiliitis, not elsewhere classified: Secondary | ICD-10-CM

## 2013-08-17 NOTE — Patient Instructions (Signed)
If this injection he is less than 6 weeks of relief, you may call to cancel the next injection

## 2013-08-17 NOTE — Progress Notes (Signed)
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 Pain has persisted despite conservative management will inject sacroiliac joints today  Bilateral sacroiliac injections under fluoroscopic guidance  Indication: Low back and buttocks pain not relieved by medication management and other conservative care.  Informed consent was obtained after describing risks and benefits of the procedure with the patient, this includes bleeding, bruising, infection, paralysis and medication side effects. The patient wishes to proceed and has given written consent. The patient was placed in a prone position. The lumbar and sacral area was marked and prepped with Betadine. A 25-gauge 1-1/2 inch needle was inserted into the skin and subcutaneous tissue and 1 mL of 1% lidocaine was injected into each side. Then a 25-gauge 3 inch spinal needle was inserted under fluoroscopic guidance into the left sacroiliac joint. AP and lateral images were utilized. Omnipaque 180x0.5 mL under live fluoroscopy demonstrated no intravascular uptake. Then a solution containing one ML of 40 mg per mL depomedrol in 2 ML of 2% lidocaine MPF was injected x1.5 mL. This same procedure was repeated on the right side using the same needle, injectate, and technique. Patient tolerated the procedure well. Post procedure instructions were given. Please see post procedure form.Right anterior capsular rent was noted 

## 2013-08-17 NOTE — Progress Notes (Signed)
  PROCEDURE RECORD [facility]   Name: BETHENNY LOSEE DOB:06/12/1975 MRN: [id number]  Date:08/17/2013  Physician: Claudette Laws, MD    Nurse/CMA: Deric Bocock CMA  Allergies:  Allergies  Allergen Reactions  . Sulfamethoxazole-Trimethoprim     REACTION: septra--doesn't recall what type of reaction  . Amoxicillin Rash    Consent Signed: yes  Is patient diabetic? no  CBG today?   Pregnant: no LMP: Patient's last menstrual period was 08/05/2013. (age 55-55)  Anticoagulants: no Anti-inflammatory: no Antibiotics: no  Procedure: Bilateral Sacroiliac Steroid Injection Position: Prone Start Time: 9:44 End Time: 9:48 Fluoro Time:14 seconds  RN/CMA Ronny Korff CMA Haileyann Staiger CMA    Time 9:05 9:51    BP 136/87 154/84    Pulse 76 83    Respirations 14 14    O2 Sat 100 100    S/S 6 6    Pain Level 4/10 0/10     D/C home with Denzil Magnuson, patient A & O X 3, D/C instructions reviewed, and sits independently.

## 2013-08-28 ENCOUNTER — Ambulatory Visit: Payer: Medicaid Other | Admitting: Internal Medicine

## 2013-08-29 ENCOUNTER — Ambulatory Visit (INDEPENDENT_AMBULATORY_CARE_PROVIDER_SITE_OTHER): Payer: Medicaid Other | Admitting: Internal Medicine

## 2013-08-29 VITALS — BP 132/87 | HR 71 | Temp 98.3°F | Ht 63.0 in | Wt 169.5 lb

## 2013-08-29 DIAGNOSIS — M533 Sacrococcygeal disorders, not elsewhere classified: Secondary | ICD-10-CM

## 2013-08-29 DIAGNOSIS — M5432 Sciatica, left side: Secondary | ICD-10-CM

## 2013-08-29 DIAGNOSIS — I10 Essential (primary) hypertension: Secondary | ICD-10-CM

## 2013-08-29 NOTE — Assessment & Plan Note (Signed)
Persistent pain for 8 years since she had her child. Tried pain clinic, physical therapy, sports medicine refferal, joint injections. Joint injections help for a week. Has had MRIs over the years- 2007, 2011, 2015, results not explaining pt pain- joint athrosis, with mild disc bulge. Pt requesting refferal to Orthopedics.  Plan- Refferal to orthopedic today.

## 2013-08-29 NOTE — Patient Instructions (Addendum)
General Instructions: We have put in your refferal to orthopedic surgery.   Please bring your medicines with you each time you come to clinic.  Medicines may include prescription medications, over-the-counter medications, herbal remedies, eye drops, vitamins, or other pills.   Progress Toward Treatment Goals:  Treatment Goal 03/24/2013  Blood pressure unchanged    Self Care Goals & Plans:  Self Care Goal 03/24/2013  Manage my medications take my medicines as prescribed; bring my medications to every visit; refill my medications on time; follow the sick day instructions if I am sick  Monitor my health keep track of my blood pressure  Eat healthy foods eat more vegetables; eat fruit for snacks and desserts; eat smaller portions  Be physically active find an activity I enjoy; take a walk every day

## 2013-08-29 NOTE — Assessment & Plan Note (Signed)
Blood pressure to today- 132/87. Stable. Compliant with amlodipine- 5mg  daily.  Plan- continue current medication.

## 2013-08-29 NOTE — Progress Notes (Signed)
Patient ID: Sue [name], female   DOB: Jan 28, 1976, 38 y.o.   MRN: [id number]   Subjective:   Patient ID: Sue [name] female   DOB: February 09, 1976 38 y.o.   MRN: [id number]  HPI: Sue [name] is a 38 y.o. with PMH of HTN, Asthma, ulcerative proctitis, and  sacro-iliac joint dysfunction, presented today with c/o pain in her sacroiliac joint area, severe, intermittent, when pain starts can take up to 1 week to stop. Pt says she cannot go to work, or go about her usual activities when it starts. Pt says this pain started after she had her child. No fever, no change in bowel habit, no abdominal pain. Pt says she dose not tolerate tramdol, has tried physical therapy, which did noit help. Was also reffered to sports medicine. She was going to a pain clinic and steroid injection were tried which helped for 1 week and then will return. Refferal to orthopedics was therefore recommened by the pain clinic.    Past Medical History  Diagnosis Date  . Asthma     mild by PFT's 03/16/1998  . Sciatica 01/12/06  . History of abnormal Pap smear     s/p cryo( Dr. Wiliam Ke)  . Cervical lymphadenopathy     biopsy negativeMarvetta Gibbons)  . Normal vaginal delivery     x3  . Complete spontaneous abortion     x2  . Hypertension   . HSV (herpes simplex virus) anogenital infection   . GERD (gastroesophageal reflux disease)   . 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    Current Outpatient Prescriptions  Medication Sig Dispense Refill  . albuterol (PROVENTIL HFA) 108 (90 BASE) MCG/ACT inhaler Inhale 2 puffs into the lungs every 6 (six) hours as needed for wheezing.  1 Inhaler  6  . amLODipine (NORVASC) 5 MG tablet Take 1 tablet (5 mg total) by mouth daily.  30 tablet  11  . Cetirizine HCl 10 MG CAPS Take 1 capsule (10 mg total) by mouth at bedtime as needed.  30 capsule  3    . diclofenac sodium (VOLTAREN) 1 % GEL Apply 2 g topically 4 (four) times daily as needed (pain).      Marland Kitchen ibuprofen (ADVIL,MOTRIN) 200 MG tablet Take 600 mg by mouth every 6 (six) hours as needed for pain.      Marland Kitchen levonorgestrel-ethinyl estradiol (SEASONALE) 0.15-0.03 MG per tablet Take 1 tablet by mouth daily.        . meloxicam (MOBIC) 15 MG tablet Take 1 tablet (15 mg total) by mouth daily.  30 tablet  1  . Multiple Vitamin (MULITIVITAMIN WITH MINERALS) TABS Take 1 tablet by mouth daily.      . valACYclovir (VALTREX) 500 MG tablet Take 500 mg by mouth daily as needed. For outbreaks.       No current facility-administered medications for this visit.   Family History  Problem Relation Age of Onset  . Ulcerative colitis Mother   . Hypertension Mother   . Diabetes Father   . Heart attack Father   . Hypertension Father   . Diabetes Sister   . Heart attack Maternal Grandmother    History   Social History  . Marital Status: Single    Spouse Name: N/A    Number of Children: N/A  . Years of Education: N/A   Social History Main  Topics  . Smoking status: Never Smoker   . Smokeless tobacco: Never Used  . Alcohol Use: Yes     Comment: occasional beer and liquor on weekends  . Drug Use: No  . Sexual Activity: Not Currently    Birth Control/ Protection: Pill   Other Topics Concern  . Not on file   Social History Narrative  . No narrative on file   Review of Systems: CONSTITUTIONAL- No Fever, weightloss, night sweat or change in appetite. SKIN- No Rash, colour changes or itching. HEAD- No Headache or dizziness. EYES- No Vision loss, pain, redness, double or blurred vision. RESPIRATORY- No Cough or SOB. CARDIAC- No Palpitations, DOE, PND or chest pain. GI- No nausea, vomiting, diarrhoea, constipation, abd pain. URINARY- No Frequency, urgency, straining or dysuria. NEUROLOGIC- No Numbness, syncope, seizures or burning. [facility]- Denies depression or anxiety.  Objective:  Physical  Exam: Filed Vitals:   08/29/13 1549  BP: 132/87  Pulse: 71  Temp: 98.3 F (36.8 C)  TempSrc: Oral  Height: 5\' 3"  (1.6 m)  Weight: 169 lb 8 oz (76.885 kg)  SpO2: 100%   GENERAL- alert, co-operative, appears as stated age, not in any distress. HEENT- Atraumatic, normocephalic, PERRL, EOMI, oral mucosa appears moist, good and intact dentition, neck supple. CARDIAC- RRR, no murmurs, rubs or gallops. RESP- Moving equal volumes of air, and clear to auscultation bilaterally, no wheezes or crackles. ABDOMEN- Soft, nontender, no guarding or rebound, no palpable masses or organomegaly, bowel sounds present. NEURO- No obvious Cr N abnormality, strenght upper and lower extremities- 5/5, Gait- Normal. BACK- No tenderness in region of sacro-iliac joint. EXTREMITIES- pulse 2+, symmetric, no pedal edema. SKIN- Warm, dry, No rash or lesion. PSYCH- Normal mood and affect, appropriate thought content and speech.  Assessment & Plan:   The patient's case and plan of care was discussed with attending physician, Dr.  Please see problem based charting for assessment and plan.

## 2013-08-30 NOTE — Progress Notes (Signed)
INTERNAL MEDICINE TEACHING ATTENDING ADDENDUM - Nischal Narendra, MD: I reviewed and discussed at the time of visit with the resident Dr. Emokpae, the patient's medical history, physical examination, diagnosis and results of tests and treatment and I agree with the patient's care as documented. 

## 2013-09-07 ENCOUNTER — Encounter (HOSPITAL_COMMUNITY): Payer: Self-pay | Admitting: *Deleted

## 2013-09-07 ENCOUNTER — Telehealth: Payer: Self-pay | Admitting: *Deleted

## 2013-09-07 ENCOUNTER — Inpatient Hospital Stay (HOSPITAL_COMMUNITY)
Admission: AD | Admit: 2013-09-07 | Discharge: 2013-09-07 | Disposition: A | Discharge status: Home / Self Care | Payer: Medicaid Other | Source: Ambulatory Visit | Attending: Obstetrics | Admitting: Obstetrics

## 2013-09-07 ENCOUNTER — Ambulatory Visit: Payer: Medicaid Other | Admitting: Obstetrics

## 2013-09-07 DIAGNOSIS — K219 Gastro-esophageal reflux disease without esophagitis: Secondary | ICD-10-CM | POA: Insufficient documentation

## 2013-09-07 DIAGNOSIS — N644 Mastodynia: Secondary | ICD-10-CM | POA: Insufficient documentation

## 2013-09-07 DIAGNOSIS — I10 Essential (primary) hypertension: Secondary | ICD-10-CM | POA: Insufficient documentation

## 2013-09-07 HISTORY — DX: Unspecified abnormal cytological findings in specimens from vagina: R87.629

## 2013-09-07 LAB — POCT PREGNANCY, URINE: Preg Test, Ur: NEGATIVE

## 2013-09-07 MED ORDER — KETOROLAC TROMETHAMINE 60 MG/2ML IM SOLN
60.0000 mg | Freq: Once | INTRAMUSCULAR | Status: AC
  Administered 2013-09-07: 60 mg via INTRAMUSCULAR
  Filled 2013-09-07: qty 2

## 2013-09-07 NOTE — Discharge Instructions (Signed)
PATIENT INSTRUCTIONS FIBROCYSTIC BREAST DISEASE  FOLLOW-UP:  Please make an appointment with your physician in 1 week(s).  Call your physician should you develop a new breast mass that is different, if one particular lump starts to enlarge, or if nipple discharge develops.   CAUSE:  Many women have some lumpiness within their breasts and these areas at times can become tender during certain times in your menstrual cycle.  These areas tend to feel like a firm rubber ball as compared to a cancer which will more commonly feel hard and almost rock-like.  Fibrocystic breast disease does not in and of itself increase your risk for breast cancer but you should be sure to examine yiour breasts at the same time of the month on a monthly basis.  If there are a lot of areas of lumpiness you should tape a piece of paper on the mirror with a diagram of your breasts, noting where the areas of lumpiness are and their relative size.  You can then refer to this diagram on a monthly basis to keep better track of any changes should they occur.  DIET:  You should try and avoid foods, or at least minimize foods, such as chocolate and caffeine which may cause the symptoms of tenderness to become worse.  ACTIVITY:  You may want to wear a bra that offers additional support, and/or consider a sports bra, especially during those times when your breasts are more tender.  MEDICATIONS:  Taking Vitamin E capsules twice a day along with Evening of Primrose Capsules three times a day, or as directed on the bottle, may help your symptoms.  These are both available over-the-counter and without a prescription. There is clinical evidence that these may help symptoms in some patients.   If your physician has prescribed medication for your fibrocystic breast disease, be sure to take it as instructed on the bottle and let him/her know if you have any side effects.  QUESTIONS:  Please feel free to call your physician  if you have any  questions, and they will be glad to assist you.

## 2013-09-07 NOTE — MAU Note (Signed)
Patient states she started having right breast pain all over the breast last night. Denies lumps or drainage from the breast.

## 2013-09-07 NOTE — MAU Provider Note (Signed)
History     CSN: [id number]  Arrival date and time: 09/07/13 1443   First Provider Initiated Contact with Patient 09/07/13 1731      Chief Complaint  Patient presents with  . Breast Pain   HPI Comments: Sue [name] 38 y.o. [id number] presents to MAU with right breast pain ongoing for 2 days. There is no mass, nipple discharge, skin changes. She denies any trauma, or reason for pains. Not breastfeeding. Denies family history of cancer. Has been wearing under wire bras exclusively.     Past Medical History  Diagnosis Date  . Asthma     mild by PFT's 03/16/1998  . Sciatica 01/12/06  . History of abnormal Pap smear     s/p cryo( Dr. Wiliam [name])  . Cervical lymphadenopathy     biopsy negativeMarvetta [name])  . Normal vaginal delivery     x3  . Complete spontaneous abortion     x2  . Hypertension   . HSV (herpes simplex virus) anogenital infection   . GERD (gastroesophageal reflux disease)   . 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   . Vaginal Pap smear, abnormal     Past Surgical History  Procedure Laterality Date  . Biopsy of lymph node in r neck  2007.  Sue [name] Kitchen Leep      Family History  Problem Relation Age of Onset  . Ulcerative colitis Mother   . Hypertension Mother   . Diabetes Father   . Heart attack Father   . Hypertension Father   . Diabetes Sister   . Heart attack Maternal Grandmother     History  Substance Use Topics  . Smoking status: Never Smoker   . Smokeless tobacco: Never Used  . Alcohol Use: Yes     Comment: occasional beer and liquor on weekends    Allergies:  Allergies  Allergen Reactions  . Sulfamethoxazole-Trimethoprim Swelling    Swelling is of the lips.  . Amoxicillin Rash    Prescriptions prior to admission  Medication Sig Dispense Refill  . amLODipine (NORVASC) 5 MG tablet Take 1 tablet (5 mg  total) by mouth daily.  30 tablet  11  . ibuprofen (ADVIL,MOTRIN) 200 MG tablet Take 600 mg by mouth every 6 (six) hours as needed for pain.      Sue [name] Kitchen levonorgestrel-ethinyl estradiol (SEASONALE) 0.15-0.03 MG per tablet Take 1 tablet by mouth daily.        . meloxicam (MOBIC) 15 MG tablet Take 1 tablet (15 mg total) by mouth daily.  30 tablet  1  . Multiple Vitamin (MULITIVITAMIN WITH MINERALS) TABS Take 1 tablet by mouth daily.      . Naproxen Sodium (ALEVE) 220 MG CAPS Take 220 mg by mouth daily as needed (For headache.).      Sue [name] Kitchen valACYclovir (VALTREX) 500 MG tablet Take 500 mg by mouth daily as needed. For outbreaks.      Sue [name] Kitchen albuterol (PROVENTIL HFA) 108 (90 BASE) MCG/ACT inhaler Inhale 2 puffs into the lungs every 6 (six) hours as needed for wheezing.  1 Inhaler  6    Review of Systems  Constitutional: Negative.   Eyes: Negative.   Respiratory: Negative.   Cardiovascular: Negative.   Gastrointestinal: Negative.   Genitourinary: Negative.   Musculoskeletal:       Right breast pain  Skin: Negative.   Neurological: Positive for headaches.  Psychiatric/Behavioral: Negative.    Physical Exam   Blood pressure 130/76, pulse 74, temperature 98.9 F (37.2 C), temperature source Oral, resp. rate 16, height 5\' 3"  (1.6 m), weight 76.749 kg (169 lb 3.2 oz), last menstrual period 08/05/2013, SpO2 100.00%.  Physical Exam  Constitutional: She is oriented to person, place, and time. She appears well-developed and well-nourished. No distress.  Has headache also  HENT:  Head: Normocephalic and atraumatic.  Eyes: Pupils are equal, round, and reactive to light.  Respiratory: Right breast exhibits tenderness. Right breast exhibits no inverted nipple, no mass, no nipple discharge and no skin change.    GI: Soft. Bowel sounds are normal.  Genitourinary:  Not done   Musculoskeletal: Normal range of motion.  Neurological: She is alert and oriented to person, place, and time.  Skin: Skin is warm.    Psychiatric: She has a normal mood and affect. Her behavior is normal. Judgment and thought content normal.    MAU Course  Procedures  MDM Toradol 60 mg IM  Assessment and Plan  A: Right breast pain  P: Toradol 60 mg now Advised to stop using underwire bra [facility] for diagnostic mammogram / after hours / will leave them message give patient order  08/07/2013 09/07/2013, 5:45 PM

## 2013-09-07 NOTE — Telephone Encounter (Signed)
Patient reports having a lot of breast pain. 4:23 LM on VM to CB to schedule appt.

## 2013-09-11 ENCOUNTER — Other Ambulatory Visit: Payer: Self-pay | Admitting: Nurse Practitioner

## 2013-09-11 DIAGNOSIS — N644 Mastodynia: Secondary | ICD-10-CM

## 2013-09-12 ENCOUNTER — Ambulatory Visit: Payer: Medicaid Other | Admitting: Obstetrics

## 2013-09-18 ENCOUNTER — Ambulatory Visit
Admission: RE | Admit: 2013-09-18 | Discharge: 2013-09-18 | Disposition: A | Discharge status: Home / Self Care | Payer: Medicaid Other | Source: Ambulatory Visit | Attending: Nurse Practitioner | Admitting: Nurse Practitioner

## 2013-09-18 ENCOUNTER — Encounter (INDEPENDENT_AMBULATORY_CARE_PROVIDER_SITE_OTHER): Payer: Self-pay

## 2013-09-18 DIAGNOSIS — N644 Mastodynia: Secondary | ICD-10-CM

## 2013-09-27 ENCOUNTER — Telehealth: Payer: Self-pay | Admitting: *Deleted

## 2013-09-27 NOTE — Telephone Encounter (Signed)
Pt was seen by Dr Clearance Coots on 09/12/13 for this problem

## 2013-09-27 NOTE — Telephone Encounter (Signed)
Patient called stating she has a mole in her private area causing irritation.   Tried to contact patient to make her an appointment. LM on VM to CB at 6:05PM.

## 2013-10-03 NOTE — Telephone Encounter (Signed)
Left message on VM to call office for an appointment with her provider.

## 2013-10-12 ENCOUNTER — Encounter: Payer: Self-pay | Admitting: Physical Medicine & Rehabilitation

## 2013-11-02 ENCOUNTER — Ambulatory Visit (INDEPENDENT_AMBULATORY_CARE_PROVIDER_SITE_OTHER): Payer: Medicaid Other | Admitting: Internal Medicine

## 2013-11-02 ENCOUNTER — Encounter: Payer: Self-pay | Admitting: Internal Medicine

## 2013-11-02 VITALS — BP 130/81 | HR 70 | Temp 97.5°F | Wt 172.7 lb

## 2013-11-02 DIAGNOSIS — J301 Allergic rhinitis due to pollen: Secondary | ICD-10-CM

## 2013-11-02 DIAGNOSIS — R300 Dysuria: Secondary | ICD-10-CM

## 2013-11-02 DIAGNOSIS — J302 Other seasonal allergic rhinitis: Secondary | ICD-10-CM

## 2013-11-02 DIAGNOSIS — M549 Dorsalgia, unspecified: Secondary | ICD-10-CM

## 2013-11-02 LAB — POCT URINALYSIS DIPSTICK
Bilirubin, UA: NEGATIVE
Glucose, UA: NEGATIVE
Ketones, UA: NEGATIVE
Leukocytes, UA: NEGATIVE
Nitrite, UA: NEGATIVE
Protein, UA: NEGATIVE
Spec Grav, UA: 1.02
Urobilinogen, UA: 1
pH, UA: 6.5

## 2013-11-02 MED ORDER — MELOXICAM 15 MG PO TABS: 15.0000 mg | ORAL_TABLET | Freq: Every day | ORAL | Status: DC

## 2013-11-02 MED ORDER — CYCLOBENZAPRINE HCL 10 MG PO TABS: 10.0000 mg | ORAL_TABLET | Freq: Every day | ORAL | Status: DC

## 2013-11-02 MED ORDER — CETIRIZINE HCL 10 MG PO TABS: 10.0000 mg | ORAL_TABLET | Freq: Every day | ORAL | Status: DC

## 2013-11-02 NOTE — Patient Instructions (Signed)
Start taking meloxicam 15 mg once a day for back pain and flexeril 10mg  once a day at night. You will need to stop taking aleve while you are taking meloxicam.  If your back pain worsens please call the clinic for an appointment.

## 2013-11-03 NOTE — Assessment & Plan Note (Signed)
Pt endorsing symptoms of seasonal allergies which she has tried zyrtec and flonase in the past.   - rx for zyrtec, and advised to continue nasal spray for post nasal drip

## 2013-11-03 NOTE — Progress Notes (Signed)
Subjective:     Patient ID: Sue [name], female   DOB: 09/17/1975, 38 y.o.   MRN: [id number]  HPI Pt is a 38 y/o female w/ PMHx of HTN, ulcerative proctitis, and chronic lower back pain who presents to clinic for back pain x 1.5 months and a sore throat x 1.5 weeks. Pt describes back pain as a muscle soreness type sensation that is located at her mid/upper back b/l. Pain wakes her up from sleep around 1-4 am in the morning. She also wakes up with back pain and as soon as she urinates and walks around pain goes away until nighttime. She is currently not in any pain. She has tried aleve for pain which did not help and she has also tried flexeril which has helped her sleep through the night w/o waking up in pain. Denies any new, unusual activities or heavy lifting. She is a IT sales professional at [employer] and has weight limitations at work. She sees ortho for medial branch blocks and has received 2 so far once on 7/16 and another time on 8/6. She reports that current mid/upper back pain that she is experiencing now started on 7/16 after her injection w/ ortho. She has since seen them on 8/6 and informed them of her back pain which they told her was not related to her spine.   She does endorse occasional dysuria and notes that at times she will have cloudy urine with a pungent odor associated w/ it. She denies frequency or urgency. She normally has night sweats but denies subjective fevers and chills.   Pt is also complaining of lt ear pain and lt throat pain x 1.5 weeks. Throat pain is more of an itchy sensation they makes her want to clear her throat. She has been sneezing, has rhinorrhea but denies coughing. She had similar symptoms as these last year as well and took zyrtec along w/ flonase that helped her w/ her symptoms.    Review of Systems  Constitutional: Negative for fever.  HENT: Positive for congestion, ear pain (left), postnasal drip, rhinorrhea, sore throat and voice change.   Eyes: Negative for  discharge.  Respiratory: Negative for cough and shortness of breath.   Genitourinary: Positive for dysuria. Negative for urgency and difficulty urinating.  Musculoskeletal: Positive for back pain.       Objective:   Physical Exam  Constitutional: She appears well-developed and well-nourished.  HENT:  Mouth/Throat: Oropharynx is clear and moist. No oropharyngeal exudate.  Eyes:  Injected sclera b/l  Cardiovascular: Normal rate and regular rhythm.   Pulmonary/Chest: Effort normal and breath sounds normal.  Abdominal: Soft. Bowel sounds are normal. There is no tenderness.  Musculoskeletal:  No CVA tenderness, normal range of motion while standing and rotating upper torso left & right, able to bend down to touch toes, no spinal tenderness to palpation, no tenderness to palpation over mid/upper back, no bruises noted       Assessment:     Please see problem based assessment and plan.        Plan:     Please see problem based assessment and plan.

## 2013-11-03 NOTE — Assessment & Plan Note (Signed)
Pt endorses occasional dysuria, cloudy urine, and pungent odor to urine. Denies urgency or frequency.   - spot UA checked in clinic today, negative for LE and nitrites. Small RBCs noted. Per chart pt has hx of hgb in all of her UAs except one in 2012. Would consider repeat UA w/smear at next visit and potential cytoscopy in the future for persistent hematuria.

## 2013-11-03 NOTE — Assessment & Plan Note (Addendum)
Pt presents to clinic today for mid/upper back pain w/ no spinal tenderness to palpation. Currently asymptomatic in exam room and only notes pain in the morning before urination and at night. Likely MSK source of pain. She cannot take ibuprofen 2/2 her ulcerative proctitis.   - rx for flexeril 10mg  QHS x 2 weeks and meloxicam 15mg  daily x 2 weeks - instructed patient to stop taking aleve while on meloxicam - checked UA since she notes back pain goes away after urination in the morning and endorses dysuria which came back negative for LE and nitrites

## 2013-11-07 NOTE — Addendum Note (Signed)
Addended by: Debe Coder B on: 11/07/2013 09:15 AM   Modules accepted: Level of Service

## 2013-11-07 NOTE — Progress Notes (Signed)
Internal Medicine Clinic Attending Date of visit: 11/02/2013  I saw and evaluated the patient.  I personally confirmed the key portions of the history and exam documented by Dr. Truong and I reviewed pertinent patient test results.  The assessment, diagnosis, and plan were formulated together and I agree with the documentation in the resident's note. 

## 2013-11-21 ENCOUNTER — Ambulatory Visit: Payer: Medicaid Other | Admitting: Physical Medicine & Rehabilitation

## 2013-12-13 ENCOUNTER — Encounter: Payer: Self-pay | Admitting: Internal Medicine

## 2013-12-13 ENCOUNTER — Ambulatory Visit (INDEPENDENT_AMBULATORY_CARE_PROVIDER_SITE_OTHER): Payer: Medicaid Other | Admitting: Internal Medicine

## 2013-12-13 VITALS — BP 127/81 | HR 76 | Temp 98.4°F | Ht 63.0 in | Wt 172.7 lb

## 2013-12-13 DIAGNOSIS — H2000 Unspecified acute and subacute iridocyclitis: Secondary | ICD-10-CM

## 2013-12-13 DIAGNOSIS — M549 Dorsalgia, unspecified: Secondary | ICD-10-CM

## 2013-12-13 NOTE — Assessment & Plan Note (Signed)
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 Vision is intact. Does not wear contacts. Ddx: I do not suspect viral bacterial conjunctivitis. She has no history of sarcoidosis. However, she has back issues, which could point toward ankylosing spondylitis, which might predispose to anterior uveitis.  Plan: 1. Labs/imaging: none 2. Therapy: urgent Ophthalmology evaluation 3. Follow up: as needed with recommendation from ophthalmology.

## 2013-12-13 NOTE — Assessment & Plan Note (Signed)
followup with orthopedic. She has a back MRI scan yesterday evening.

## 2013-12-13 NOTE — Progress Notes (Signed)
Patient ID: Sue [name], female   DOB: [birth year], 38 y.o.   MRN: [id number]   Subjective:   HPI: [name] is a 38 y.o. female w/ PMHx of HTN, ulcerative proctitis, and chronic lower back pain who presents left eye pain   Reason(s) for this visit: 1. Left eye pain: patient had noticed pain in the left eye which started over the weekend. She also endorses blured vision in the same eye. No contacts with persons with similar symptoms. No increased drainage from the left eye. No sensation of sand in the eye. She reports photosensitivity. Does not wear contacts. No floaters. No fevers, chills. No URI symptoms. She endorses headaches on the left side. Patient wonders whether she has Ankylosing spondylitis, which she read over the Internet that might be associated with the eye pain. This is the first episode. The right eye is normal. She has not used any home regimens. Significantly, patient reports that mother has similar symptoms involving her eyes and she was born legally blind. No personal family history of sarcoidosis.    ROS: Constitutional: Denies fever, chills, diaphoresis, appetite change and fatigue.  Respiratory: Denies SOB, DOE, cough, chest tightness, and wheezing. Denies chest pain. CVS: No chest pain, palpitations and leg swelling.  GI: No abdominal pain, nausea, vomiting, bloody stools GU: No dysuria, frequency, hematuria, or flank pain.  MSK: No myalgias, back pain, joint swelling, arthralgias  Psych: No depression symptoms. No SI or SA.    Objective:  Physical Exam: Filed Vitals:   12/13/13 1002  BP: 127/81  Pulse: 76  Temp: 98.4 F (36.9 C)  TempSrc: Oral  Height: 5\' 3"  (1.6 m)  Weight: 172 lb 11.2 oz (78.336 kg)  SpO2: 100%   General: Well nourished. No acute distress.  HEENT: Normal oral mucosa. MMM.  Left eye: vision is normal. Pupil is sluggish on reaction compared to the right side. Increased redness. No drainage. Patient avoids any contact with the eye,  and she exhibits repulsion to light. Lungs: CTA bilaterally. Heart: RRR; no extra sounds or murmurs  Abdomen: Non-distended, normal bowel sounds, soft, nontender; no hepatosplenomegaly  Extremities: No pedal edema. No joint swelling or tenderness. Neurologic: Normal EOM,  Alert and oriented x3. No obvious neurologic/cranial nerve deficits.  Assessment & Plan:  Discussed case with my attending in the clinic, Dr. See problem based charting.

## 2013-12-13 NOTE — Patient Instructions (Addendum)
General Instructions: I will refer you to an eye doctor  Please bring your medicines with you each time you come to clinic.  Medicines may include prescription medications, over-the-counter medications, herbal remedies, eye drops, vitamins, or other pills.   Progress Toward Treatment Goals:  Treatment Goal 03/24/2013  Blood pressure unchanged    Self Care Goals & Plans:  Self Care Goal 12/13/2013  Manage my medications take my medicines as prescribed; bring my medications to every visit; refill my medications on time  Monitor my health -  Eat healthy foods drink diet soda or water instead of juice or soda; eat more vegetables; eat foods that are low in salt; eat baked foods instead of fried foods; eat fruit for snacks and desserts  Be physically active -    No flowsheet data found.   Care Management & Community Referrals:  No flowsheet data found.     Uveitis The uveal tract (uvea) is a layer of the eye made up of three structures:   The choroid - a layer containing the eye's blood vessels located between the outer white part of the eyeball (sclera) and the inner layer (retina).  The iris - the colored portion of the eye.  The ciliary body - a area of muscle at the base of the iris that helps focus the lens. Uveitis happens when any or all portions of the uveal tract become inflamed.   Iritis is when the iris becomes inflamed. It is the most common form of uveitis. It is extremely important to treat iritis early, as it may lead to internal eye damage causing scarring or diseases such as glaucoma. Some people have only one attack of iritis (in one or both eyes) in their lifetime, while others may get it many times.  Pars planitis is when the ciliary body becomes inflamed.  Choroiditis is when the choroid layer becomes inflamed. If the inflammation also involves the retina, it is called chorioretinitis.  Panuveitis is when inflammation affects all of the structures of the  uveal tract. CAUSES   Diseases where the body's immune system attacks tissues within your own body (autoimmune diseases).  Infections (tuberculosis, gonorrhea, fungus infections, Lyme disease, infection of the lining of the heart).  Trauma or injury.  Eye diseases (acute glaucoma and others).  Inflammation from other parts of the uveal track.  Severe eye infections.  Other rare diseases. SYMPTOMS  Iritis  Eye pain or aching.  Light sensitivity.  Loss of sight or blurred vision.  Redness of the eye. This is often accompanied by a ring of redness around the outside of the cornea or clear covering at the front of the eye (ciliary flush).  Excessive tearing of the eye(s).  A small pupil that does not enlarge in the dark and stays smaller than the other eye's pupil.  A whitish area that obscures the lower part of the colored iris. Sometimes this is visible when looking at the eye. Since iritis causes the eye to become red, it is often confused with a much less dangerous form of "pink eye" or conjunctivitis. One of the most important symptoms is sensitivity to light. Anytime there is redness, discomfort in the eye(s) and extreme light sensitivity, it is extremely important to see an ophthalmologist as soon as possible. Pars Planitis  Mild, progressive drop in vision.  Floating spots in the field of vision in front of the eye.  Development of a cataract. Choroiditis  May have no symptoms.  Progressive drop in vision.  Loss of vision in an isolated area of peripheral vision (scotoma) or to the side.  Sore, aching eye. TREATMENT  Iritis  Corticosteroid eye drops and dilating agent eye drops are often given. Corticosteroid eye drops are used to decrease inflammation. Dilating drops help to prevent scarring of the iris and the risk of the iris becoming "stuck" to the front surface of the lens. Follow your caregivers exact instructions on taking and stopping corticosteroid  medications (drops or pills).  Sometimes, the iritis will be so severe that it will not respond to commonly used medications. If this happens, it may be necessary to use steroid injections. The injections are given under the eye's outer surface. Sometimes oral medications are given. Treatment used for iritis is usually made on an individual basis. Pars Planitis  If mild, no treatment may be required  Corticosteroids are usually given orally or for severe cases, injections are given under the surface of the eye. Choroiditis  If associated with an underlying disease, the disease itself is treated.  Corticosteroids are usually administered by mouth (orally).  Corticosteroid injections or other medicines specific are given to treat the cause of infection or inflammation. These injections are given into the interior cavity of the eye (intravitreal injection).  If caused by a parasite or worm that can be seen by the ophthalmologists, laser treatments may be used to kill the parasite. Panuveitis  Treatment depends upon the cause and severity of the inflammation. It may consist of any one, or a combination of the treatments outlined above. HOME CARE INSTRUCTIONS  Your caregiver will give specific instructions regarding the use of eye medications or other medications. Follow all instructions in both taking and stopping the medications. SEEK IMMEDIATE MEDICAL CARE IF:  You have redness of one or both eyes.  You experience a great deal of light sensitivity.  You have pain or aching in either eye.  You notice a drop in vision in either eye. MAKE SURE YOU:   Understand these instructions.  Will watch your condition.  Will get help right away if you are not doing well or get worse. Document Released: 05/22/2008 Document Revised: 05/18/2011 Document Reviewed: 05/22/2008 [facility] Patient Information 2015 [place], [place]. This information is not intended to replace advice given to you by your  health care provider. Make sure you discuss any questions you have with your health care provider.

## 2013-12-14 NOTE — Progress Notes (Addendum)
INTERNAL MEDICINE TEACHING ATTENDING ADDENDUM - Wyett Narine, MD: I reviewed and discussed at the time of visit with the resident Dr. Kazibwe, the patient's medical history, physical examination, diagnosis and results of pertinent tests and treatment and I agree with the patient's care as documented.  

## 2014-01-08 ENCOUNTER — Encounter: Payer: Self-pay | Admitting: Internal Medicine

## 2014-04-05 ENCOUNTER — Telehealth: Payer: Self-pay | Admitting: *Deleted

## 2014-04-05 NOTE — Telephone Encounter (Signed)
Pt called - having left side chest pain past 6-8 weeks - worse at night. Rate pain 0-10 at 8 and goes to back area. Pt states hx heart disease in family. Denies chest pain at time phone call. Suggest to go to ER. Taking med for acid reflux. Stanton Kidney Johnmark Geiger RN 04/05/14 2:50PM

## 2014-04-06 ENCOUNTER — Emergency Department (HOSPITAL_COMMUNITY): Payer: Medicaid Other

## 2014-04-06 ENCOUNTER — Encounter (HOSPITAL_COMMUNITY): Payer: Self-pay

## 2014-04-06 ENCOUNTER — Emergency Department (HOSPITAL_COMMUNITY)
Admission: EM | Admit: 2014-04-06 | Discharge: 2014-04-06 | Disposition: A | Discharge status: Home / Self Care | Payer: Medicaid Other | Attending: Emergency Medicine | Admitting: Emergency Medicine

## 2014-04-06 DIAGNOSIS — Z8619 Personal history of other infectious and parasitic diseases: Secondary | ICD-10-CM | POA: Diagnosis not present

## 2014-04-06 DIAGNOSIS — Z8739 Personal history of other diseases of the musculoskeletal system and connective tissue: Secondary | ICD-10-CM | POA: Diagnosis not present

## 2014-04-06 DIAGNOSIS — Z880 Allergy status to penicillin: Secondary | ICD-10-CM | POA: Insufficient documentation

## 2014-04-06 DIAGNOSIS — R002 Palpitations: Secondary | ICD-10-CM | POA: Insufficient documentation

## 2014-04-06 DIAGNOSIS — Z793 Long term (current) use of hormonal contraceptives: Secondary | ICD-10-CM | POA: Insufficient documentation

## 2014-04-06 DIAGNOSIS — Z3202 Encounter for pregnancy test, result negative: Secondary | ICD-10-CM | POA: Diagnosis not present

## 2014-04-06 DIAGNOSIS — R079 Chest pain, unspecified: Secondary | ICD-10-CM | POA: Insufficient documentation

## 2014-04-06 DIAGNOSIS — I10 Essential (primary) hypertension: Secondary | ICD-10-CM | POA: Diagnosis not present

## 2014-04-06 DIAGNOSIS — Z79899 Other long term (current) drug therapy: Secondary | ICD-10-CM | POA: Diagnosis not present

## 2014-04-06 DIAGNOSIS — J45901 Unspecified asthma with (acute) exacerbation: Secondary | ICD-10-CM | POA: Diagnosis not present

## 2014-04-06 DIAGNOSIS — Z8719 Personal history of other diseases of the digestive system: Secondary | ICD-10-CM | POA: Diagnosis not present

## 2014-04-06 LAB — TROPONIN I: Troponin I: <0.03 ng/mL (ref <0.031)

## 2014-04-06 LAB — POC URINE PREG, ED: Preg Test, Ur: NEGATIVE

## 2014-04-06 LAB — CBC
HCT: 41.1 % (ref 36.0–46.0)
Hemoglobin: 14 g/dL (ref 12.0–15.0)
MCH: 29.3 pg (ref 26.0–34.0)
MCHC: 34.1 g/dL (ref 30.0–36.0)
MCV: 86 fL (ref 78.0–100.0)
Platelets: 365 10*3/uL (ref 150–400)
RBC: 4.78 MIL/uL (ref 3.87–5.11)
RDW: 14.4 % (ref 11.5–15.5)
WBC: 7.2 10*3/uL (ref 4.0–10.5)

## 2014-04-06 LAB — BASIC METABOLIC PANEL
Anion gap: 7 (ref 5–15)
BUN: 9 mg/dL (ref 6–23)
CO2: 28 mmol/L (ref 19–32)
Calcium: 9.3 mg/dL (ref 8.4–10.5)
Chloride: 102 mmol/L (ref 96–112)
Creatinine, Ser: 0.98 mg/dL (ref 0.50–1.10)
GFR calc Af Amer: 84 mL/min — ABNORMAL LOW (ref >90)
GFR calc non Af Amer: 72 mL/min — ABNORMAL LOW (ref >90)
Glucose, Bld: 123 mg/dL — ABNORMAL HIGH (ref 70–99)
Potassium: 4.3 mmol/L (ref 3.5–5.1)
Sodium: 137 mmol/L (ref 135–145)

## 2014-04-06 LAB — BRAIN NATRIURETIC PEPTIDE: B Natriuretic Peptide: 13.3 pg/mL (ref 0.0–100.0)

## 2014-04-06 NOTE — ED Notes (Signed)
Pt reports having central chest pain intermittently at night since November. Pt reports pain waking her from sleep. Shortness of breath. Non radiating.

## 2014-04-06 NOTE — ED Provider Notes (Signed)
CSN: [id number]     Arrival date & time 04/06/14  0254 History   First MD Initiated Contact with Patient 04/06/14 [phone]     Chief Complaint  Patient presents with  . Chest Pain     (Consider location/radiation/quality/duration/timing/severity/associated sxs/prior Treatment) The history is provided by the patient and medical records.    This is a 39 year old female with past medical history significant for asthma, sciatica, hypertension, HSV, GERD, presenting to the ED for chest pain.  Patient states she has been having central chest pain intermittently since November 2015. Patient states this mostly occurs at night while sleeping and awakes her from sleep. Patient states when pain occurs it feels like a "pressure" associated with some mild shortness of breath and palpitations. States she has to get out of bed and walk around the house for several minutes before will resolve. Patient has no known cardiac history. No history of sleep apnea. She does have family history of coronary artery disease and stroke-- her mother and maternal grandmother. Patient has never been a smoker. She has never been evaluated by a cardiologist. She denies any recent cough, fever, chills, abdominal pain, nausea, vomiting, or diaphoresis.  No recent travel, surgeries, prolonged immobilization, lower extremity edema, or calf pain. Patient is not currently on any exogenous estrogens. No prior history of DVT or PE.  Patient is asymptomatic on arrival to ED.  Past Medical History  Diagnosis Date  . Asthma     mild by PFT's 03/16/1998  . Sciatica 01/12/06  . History of abnormal Pap smear     s/p cryo( Dr. Wiliam Ke)  . Cervical lymphadenopathy     biopsy negativeMarvetta Gibbons)  . Normal vaginal delivery     x3  . Complete spontaneous abortion     x2  . Hypertension   . HSV (herpes simplex virus) anogenital infection   . GERD (gastroesophageal reflux disease)   . 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   . Vaginal Pap smear, abnormal    Past Surgical History  Procedure Laterality Date  . Biopsy of lymph node in r neck  2007.  Marland Kitchen Leep     Family History  Problem Relation Age of Onset  . Ulcerative colitis Mother   . Hypertension Mother   . Diabetes Father   . Heart attack Father   . Hypertension Father   . Diabetes Sister   . Heart attack Maternal Grandmother    History  Substance Use Topics  . Smoking status: Never Smoker   . Smokeless tobacco: Never Used  . Alcohol Use: Yes     Comment: occasional beer and liquor on weekends   OB History    Gravida Para Term Preterm AB TAB SAB Ectopic Multiple Living   6 3 2 1 3  3   3      Review of Systems  Respiratory: Positive for shortness of breath.   Cardiovascular: Positive for chest pain and palpitations.  All other systems reviewed and are negative.     Allergies  Sulfamethoxazole-trimethoprim and Amoxicillin  Home Medications   Prior to Admission medications   Medication Sig Start Date End Date Taking? Authorizing Provider  albuterol (PROVENTIL HFA) 108 (90 BASE) MCG/ACT inhaler Inhale 2 puffs into the lungs every 6 (six) hours as needed for wheezing. 03/24/13 04/23/15  04/25/15, MD  amLODipine (NORVASC) 5  MG tablet Take 1 tablet (5 mg total) by mouth daily. 08/07/13   Brock Bad, MD  cetirizine (ZYRTEC) 10 MG tablet Take 1 tablet (10 mg total) by mouth daily. 11/02/13   Gara Kroner, MD  cyclobenzaprine (FLEXERIL) 10 MG tablet Take 1 tablet (10 mg total) by mouth at bedtime. 11/02/13   Gara Kroner, MD  levonorgestrel-ethinyl estradiol (SEASONALE) 0.15-0.03 MG per tablet Take 1 tablet by mouth daily.      Historical Provider, MD  meloxicam (MOBIC) 15 MG tablet Take 1 tablet (15 mg total) by mouth daily. 11/02/13   Gara Kroner, MD  Multiple Vitamin (MULITIVITAMIN WITH MINERALS) TABS Take 1  tablet by mouth daily.    Historical Provider, MD  Naproxen Sodium (ALEVE) 220 MG CAPS Take 220 mg by mouth daily as needed (For headache.).    Historical Provider, MD  valACYclovir (VALTREX) 500 MG tablet Take 500 mg by mouth daily as needed. For outbreaks.    Historical Provider, MD   BP 155/93 mmHg  Pulse 87  Temp(Src) 97.9 F (36.6 C) (Oral)  Resp 17  SpO2 100%  LMP 04/03/2014   Physical Exam  Constitutional: She is oriented to person, place, and time. She appears well-developed and well-nourished. No distress.  HENT:  Head: Normocephalic and atraumatic.  Mouth/Throat: Oropharynx is clear and moist.  Eyes: Conjunctivae and EOM are normal. Pupils are equal, round, and reactive to light.  Neck: Normal range of motion. Neck supple.  Cardiovascular: Normal rate, regular rhythm and normal heart sounds.   Pulmonary/Chest: Effort normal and breath sounds normal. No respiratory distress. She has no wheezes.  Abdominal: Soft. Bowel sounds are normal. There is no tenderness. There is no guarding.  Musculoskeletal: Normal range of motion. She exhibits no edema.  No calf asymmetry, tenderness, or palpable cords No edema  Neurological: She is alert and oriented to person, place, and time.  Skin: Skin is warm and dry. She is not diaphoretic.  Psychiatric: She has a normal mood and affect.  Nursing note and vitals reviewed.   ED Course  Procedures (including critical care time) Labs Review Labs Reviewed  BASIC METABOLIC PANEL - Abnormal; Notable for the following:    Glucose, Bld 123 (*)    GFR calc non Af Amer 72 (*)    GFR calc Af Amer 84 (*)    All other components within normal limits  CBC  TROPONIN I  BRAIN NATRIURETIC PEPTIDE  POC URINE PREG, ED    Imaging Review Dg Chest 2 View  04/06/2014   CLINICAL DATA:  Chest pain and shortness of breath since November  EXAM: CHEST  2 VIEW  COMPARISON:  07/08/2010  FINDINGS: Normal heart size, mediastinal contours, and pulmonary  vascularity.  Minimal chronic peribronchial thickening.  No pulmonary infiltrate, pleural effusion, or pneumothorax.  Bones unremarkable.  EKG lead projects over lateral RIGHT upper chest.  IMPRESSION: Minimal chronic bronchitic changes without infiltrate.   Electronically Signed   By: Ulyses Southward M.D.   On: 04/06/2014 09:16     EKG Interpretation   Date/Time:  Friday April 06 2014 08:23:51 EST Ventricular Rate:  82 PR Interval:  148 QRS Duration: 77 QT Interval:  363 QTC Calculation: 424 R Axis:   29 Text Interpretation:  Sinus rhythm Borderline T wave abnormalities No  significant change since last tracing Confirmed by POLLINA  MD,  CHRISTOPHER [phone]) on 04/06/2014 8:27:09 AM      MDM   Final diagnoses:  Chest pain   39 year old  female with intermittent chest pain, SOB, and palpitations for the past 3 months. No personal cardiac history, family history of heart disease and stroke. On exam, patient afebrile and nontoxic in appearance. She is currently asymptomatic, vital signs stable. EKG sinus rhythm without ischemic change.  Labwork reassuring, troponin negative. Chest x-ray is clear. Patient has remained in normal sinus rhythm on monitor, no episodes of chest pain, palpitations, or SOB while in the emergency department. At this time low suspicion for ACS, PE, dissection, or other acute cardiac event. Patient given referral to cardiology for further evaluation, possible Holter monitor.  Discussed plan with patient, he/she acknowledged understanding and agreed with plan of care.  Return precautions given for new or worsening symptoms.  Garlon Hatchet, PA-C 04/06/14 1150  Gilda Crease, MD 04/06/14 [phone]

## 2014-04-06 NOTE — Discharge Instructions (Signed)
Your work-up today was normal. Follow-up with cardiology if desired-- call to schedule appt. Return to the ED for new or worsening symptoms.

## 2014-05-22 ENCOUNTER — Telehealth: Payer: Self-pay | Admitting: Internal Medicine

## 2014-05-22 NOTE — Telephone Encounter (Signed)
Call to patient to confirm appointment for 05/23/14 at 1:30 lmtcb

## 2014-05-23 ENCOUNTER — Encounter: Payer: Self-pay | Admitting: Internal Medicine

## 2014-05-23 ENCOUNTER — Ambulatory Visit (INDEPENDENT_AMBULATORY_CARE_PROVIDER_SITE_OTHER): Payer: Medicaid Other | Admitting: Internal Medicine

## 2014-05-23 VITALS — BP 125/69 | HR 96 | Temp 97.8°F | Ht 63.0 in | Wt 172.1 lb

## 2014-05-23 DIAGNOSIS — R05 Cough: Secondary | ICD-10-CM

## 2014-05-23 DIAGNOSIS — I10 Essential (primary) hypertension: Secondary | ICD-10-CM

## 2014-05-23 DIAGNOSIS — R059 Cough, unspecified: Secondary | ICD-10-CM

## 2014-05-23 MED ORDER — AZITHROMYCIN 250 MG PO TABS: ORAL_TABLET | ORAL | Status: AC

## 2014-05-23 MED ORDER — FAMOTIDINE 20 MG PO TABS: 20.0000 mg | ORAL_TABLET | Freq: Two times a day (BID) | ORAL | Status: DC

## 2014-05-23 MED ORDER — LORATADINE 10 MG PO TABS: 10.0000 mg | ORAL_TABLET | Freq: Every day | ORAL | Status: DC

## 2014-05-23 MED ORDER — ALBUTEROL SULFATE HFA 108 (90 BASE) MCG/ACT IN AERS: 2.0000 | INHALATION_SPRAY | Freq: Four times a day (QID) | RESPIRATORY_TRACT | Status: DC | PRN

## 2014-05-23 NOTE — Assessment & Plan Note (Addendum)
Ms Sensabaugh has had a progressively worsening cough for three weeks productive of white/clear sputum with associated SOB and post-nasal drip. The cause is unclear. While she has a history of asthma and is taking her albuterol inhaler 3x a day, asthma exacerbation is unlikely given the time frame and there was no wheezing on exam. She can have an infection such as bronchitis or PNA. A viral etiology is possible but unlikely given progression of symptoms. If she had a virus, it may have cleared but triggered her acid reflux or her seasonal allergies could be exacerbating it in a positive feedback. We will thus treat multifactorially with antibiotics and antihistamine and ask her to return if no improvement in two weeks for CXR and lab work -azithromycin 500 mg once, then 250 mg daily x 4 days -loratadine 10 mg daily -famotidine 20 mg bid -refill albuterol inhaler 2 puff q6hprn

## 2014-05-23 NOTE — Progress Notes (Signed)
   Subjective:    Patient ID: Sue [name], female    DOB: 02-12-1976, 39 y.o   MRN: [id number]  HPI  Sue [name] is a 39 year old woman with asthma, HTN, ulcerative proctitis, sciatica who presents for cough. She has had a cough productive of sputum that is either clear or white. She has had this for about three weeks and has gotten progressively worse. She also feels a post-nasal drip and feels like she has to clear her throat. She says she usually gets these symptoms when winter turns to spring or when summer turns to fall. She is using her albuterol inhaler about three times a day. She feels some shortness of breath but not wheezy. She denies fevers, chills, night sweats.   Review of Systems  Constitutional: Negative for fever, chills and diaphoresis.  HENT: Positive for congestion and postnasal drip. Negative for ear discharge, ear pain, rhinorrhea, sinus pressure and sore throat.   Respiratory: Positive for cough and shortness of breath. Negative for wheezing.   Cardiovascular: Negative for chest pain.  Neurological: Negative for dizziness, weakness, light-headedness and numbness.       Objective:   Physical Exam  Constitutional: She is oriented to person, place, and time. She appears well-developed and well-nourished. No distress.  HENT:  Head: Normocephalic and atraumatic.  Mouth/Throat: Oropharynx is clear and moist. No oropharyngeal exudate.  No sinus tenderness on palpation  Eyes: EOM are normal. Pupils are equal, round, and reactive to light.  Cardiovascular: Normal rate, regular rhythm, normal heart sounds and intact distal pulses.  Exam reveals no gallop and no friction rub.   No murmur heard. Pulmonary/Chest: Effort normal and breath sounds normal. No respiratory distress. She has no wheezes.  Abdominal: Soft. Bowel sounds are normal. She exhibits no distension. There is no tenderness.  Neurological: She is alert and oriented to person, place, and time.  Skin: She is not  diaphoretic.  Vitals reviewed.         Assessment & Plan:

## 2014-05-23 NOTE — Patient Instructions (Signed)
It was a pleasure to see you today. Please take the antibiotic (two pills today then one pill a day for four days starting tomorrow). Take the pepcid twice a day for reflux and claritin once a day for allergies. Call us to return if no improvement in two weeks. Please return to clinic or seek medical attention if you have any new or worsening trouble breathing, cough, fever, or other worrisome medical condition. We look forward to seeing you again as needed.  Farley Ly, MD  General Instructions:   Please try to bring all your medicines next time. This will help [place] keep you safe from mistakes.   Progress Toward Treatment Goals:  Treatment Goal 05/23/2014  Blood pressure at goal    Self Care Goals & Plans:  Self Care Goal 05/23/2014  Manage my medications bring my medications to every visit; refill my medications on time; take my medicines as prescribed  Monitor my health -  Eat healthy foods drink diet soda or water instead of juice or soda; eat more vegetables; eat foods that are low in salt; eat baked foods instead of fried foods  Be physically active -    No flowsheet data found.   Care Management & Community Referrals:  No flowsheet data found.

## 2014-05-23 NOTE — Assessment & Plan Note (Signed)
BP Readings from Last 3 Encounters:  05/23/14 125/69  04/06/14 128/84  12/13/13 127/81    Lab Results  Component Value Date   NA 137 04/06/2014   K 4.3 04/06/2014   CREATININE 0.98 04/06/2014    Assessment: Blood pressure control: controlled Progress toward BP goal:  at goal Comments: well controlled on amlodipine 5 mg daily  Plan: Medications:  continue current medications Educational resources provided:   Self management tools provided:   Other plans: none

## 2014-05-25 NOTE — Progress Notes (Signed)
Internal Medicine Clinic Attending  Case discussed with Dr. Rothman at the time of the visit.  We reviewed the resident's history and exam and pertinent patient test results.  I agree with the assessment, diagnosis, and plan of care documented in the resident's note. 

## 2014-07-09 ENCOUNTER — Other Ambulatory Visit: Payer: Self-pay | Admitting: *Deleted

## 2014-07-09 DIAGNOSIS — B009 Herpesviral infection, unspecified: Secondary | ICD-10-CM

## 2014-07-09 MED ORDER — VALACYCLOVIR HCL 500 MG PO TABS: ORAL_TABLET | ORAL | Status: DC

## 2014-07-13 ENCOUNTER — Emergency Department (HOSPITAL_COMMUNITY)
Admission: EM | Admit: 2014-07-13 | Discharge: 2014-07-13 | Disposition: A | Discharge status: Home / Self Care | Payer: Medicaid Other | Source: Home / Self Care | Attending: Family Medicine | Admitting: Family Medicine

## 2014-07-13 ENCOUNTER — Encounter (HOSPITAL_COMMUNITY): Payer: Self-pay | Admitting: Emergency Medicine

## 2014-07-13 DIAGNOSIS — S46811A Strain of other muscles, fascia and tendons at shoulder and upper arm level, right arm, initial encounter: Secondary | ICD-10-CM

## 2014-07-13 DIAGNOSIS — J301 Allergic rhinitis due to pollen: Secondary | ICD-10-CM

## 2014-07-13 DIAGNOSIS — J029 Acute pharyngitis, unspecified: Secondary | ICD-10-CM | POA: Diagnosis not present

## 2014-07-13 DIAGNOSIS — R0982 Postnasal drip: Secondary | ICD-10-CM

## 2014-07-13 DIAGNOSIS — S161XXA Strain of muscle, fascia and tendon at neck level, initial encounter: Secondary | ICD-10-CM

## 2014-07-13 MED ORDER — DICLOFENAC SODIUM 1 % TD GEL: 1.0000 "application " | Freq: Four times a day (QID) | TRANSDERMAL | Status: DC

## 2014-07-13 NOTE — Discharge Instructions (Signed)
Allergic Rhinitis Allergic rhinitis is when the mucous membranes in the nose respond to allergens. Allergens are particles in the air that cause your body to have an allergic reaction. This causes you to release allergic antibodies. Through a chain of events, these eventually cause you to release histamine into the blood stream. Although meant to protect the body, it is this release of histamine that causes your discomfort, such as frequent sneezing, congestion, and an itchy, runny nose.  CAUSES  Seasonal allergic rhinitis (hay fever) is caused by pollen allergens that may come from grasses, trees, and weeds. Year-round allergic rhinitis (perennial allergic rhinitis) is caused by allergens such as house dust mites, pet dander, and mold spores.  SYMPTOMS   Nasal stuffiness (congestion).  Itchy, runny nose with sneezing and tearing of the eyes. DIAGNOSIS  Your health care provider can help you determine the allergen or allergens that trigger your symptoms. If you and your health care provider are unable to determine the allergen, skin or blood testing may be used. TREATMENT  Allergic rhinitis does not have a cure, but it can be controlled by:  Medicines and allergy shots (immunotherapy).  Avoiding the allergen. Hay fever may often be treated with antihistamines in pill or nasal spray forms. Antihistamines block the effects of histamine. There are over-the-counter medicines that may help with nasal congestion and swelling around the eyes. Check with your health care provider before taking or giving this medicine.  If avoiding the allergen or the medicine prescribed do not work, there are many new medicines your health care provider can prescribe. Stronger medicine may be used if initial measures are ineffective. Desensitizing injections can be used if medicine and avoidance does not work. Desensitization is when a patient is given ongoing shots until the body becomes less sensitive to the allergen.  Make sure you follow up with your health care provider if problems continue. HOME CARE INSTRUCTIONS It is not possible to completely avoid allergens, but you can reduce your symptoms by taking steps to limit your exposure to them. It helps to know exactly what you are allergic to so that you can avoid your specific triggers. SEEK MEDICAL CARE IF:   You have a fever.  You develop a cough that does not stop easily (persistent).  You have shortness of breath.  You start wheezing.  Symptoms interfere with normal daily activities. Document Released: 11/18/2000 Document Revised: 02/28/2013 Document Reviewed: 10/31/2012 [facility] Patient Information 2015 [place], [place]. This information is not intended to replace advice given to you by your health care provider. Make sure you discuss any questions you have with your health care provider.  Muscle Strain A muscle strain is an injury that occurs when a muscle is stretched beyond its normal length. Usually a small number of muscle fibers are torn when this happens. Muscle strain is rated in degrees. First-degree strains have the least amount of muscle fiber tearing and pain. Second-degree and third-degree strains have increasingly more tearing and pain.  Usually, recovery from muscle strain takes 1-2 weeks. Complete healing takes 5-6 weeks.  CAUSES  Muscle strain happens when a sudden, violent force placed on a muscle stretches it too far. This may occur with lifting, sports, or a fall.  RISK FACTORS Muscle strain is especially common in athletes.  SIGNS AND SYMPTOMS At the site of the muscle strain, there may be:  Pain.  Bruising.  Swelling.  Difficulty using the muscle due to pain or lack of normal function. DIAGNOSIS  Your health  care provider will perform a physical exam and ask about your medical history. TREATMENT  Often, the best treatment for a muscle strain is resting, icing, and applying cold compresses to the injured area.   HOME CARE INSTRUCTIONS   Use the PRICE method of treatment to promote muscle healing during the first 2-3 days after your injury. The PRICE method involves:  Protecting the muscle from being injured again.  Restricting your activity and resting the injured body part.  Icing your injury. To do this, put ice in a plastic bag. Place a towel between your skin and the bag. Then, apply the ice and leave it on from 15-20 minutes each hour. After the third day, switch to moist heat packs.  Apply compression to the injured area with a splint or elastic bandage. Be careful not to wrap it too tightly. This may interfere with blood circulation or increase swelling.  Elevate the injured body part above the level of your heart as often as you can.  Only take over-the-counter or prescription medicines for pain, discomfort, or fever as directed by your health care provider.  Warming up prior to exercise helps to prevent future muscle strains. SEEK MEDICAL CARE IF:   You have increasing pain or swelling in the injured area.  You have numbness, tingling, or a significant loss of strength in the injured area. MAKE SURE YOU:   Understand these instructions.  Will watch your condition.  Will get help right away if you are not doing well or get worse. Document Released: 02/23/2005 Document Revised: 12/14/2012 Document Reviewed: 09/22/2012 [facility] Patient Information 2015 [place], [place]. This information is not intended to replace advice given to you by your health care provider. Make sure you discuss any questions you have with your health care provider.  Sore Throat A sore throat is pain, burning, irritation, or scratchiness of the throat. There is often pain or tenderness when swallowing or talking. A sore throat may be accompanied by other symptoms, such as coughing, sneezing, fever, and swollen neck glands. A sore throat is often the first sign of another sickness, such as a cold, flu, strep  throat, or mononucleosis (commonly known as mono). Most sore throats go away without medical treatment. CAUSES  The most common causes of a sore throat include:  A viral infection, such as a cold, flu, or mono.  A bacterial infection, such as strep throat, tonsillitis, or whooping cough.  Seasonal allergies.  Dryness in the air.  Irritants, such as smoke or pollution.  Gastroesophageal reflux disease (GERD). HOME CARE INSTRUCTIONS   Only take over-the-counter medicines as directed by your caregiver.  Drink enough fluids to keep your urine clear or pale yellow.  Rest as needed.  Try using throat sprays, lozenges, or sucking on hard candy to ease any pain (if older than 4 years or as directed).  Sip warm liquids, such as broth, herbal tea, or warm water with honey to relieve pain temporarily. You may also eat or drink cold or frozen liquids such as frozen ice pops.  Gargle with salt water (mix 1 tsp salt with 8 oz of water).  Do not smoke and avoid secondhand smoke.  Put a cool-mist humidifier in your bedroom at night to moisten the air. You can also turn on a hot shower and sit in the bathroom with the door closed for 5-10 minutes. SEEK IMMEDIATE MEDICAL CARE IF:  You have difficulty breathing.  You are unable to swallow fluids, soft foods, or your saliva.  You have increased swelling in the throat.  Your sore throat does not get better in 7 days.  You have nausea and vomiting.  You have a fever or persistent symptoms for more than 2-3 days.  You have a fever and your symptoms suddenly get worse. MAKE SURE YOU:   Understand these instructions.  Will watch your condition.  Will get help right away if you are not doing well or get worse. Document Released: 04/02/2004 Document Revised: 02/10/2012 Document Reviewed: 11/01/2011 [facility] Patient Information 2015 [place], [place]. This information is not intended to replace advice given to you by your health care  provider. Make sure you discuss any questions you have with your health care provider.

## 2014-07-13 NOTE — ED Provider Notes (Signed)
CSN: [id number]     Arrival date & time 07/13/14  4166 History   First MD Initiated Contact with Patient 07/13/14 1132     Chief Complaint  Patient presents with  . Neck Pain  . Otalgia   (Consider location/radiation/quality/duration/timing/severity/associated sxs/prior Treatment) HPI Comments: 39 year old female is complaining of throat pain with swallowing. She is also complaining of PND and having to clear her throat often. For approximately one week she has had pain to the right side of her neck. She points to the right trapezius muscle and splenius capitis. She is uncertain as to why this muscle began hurting her. So worse with rotation particularly to the left. She denies any known injury or trauma. She is currently taking Coricidin for drainage.   Past Medical History  Diagnosis Date  . Asthma     mild by PFT's 03/16/1998  . Sciatica 01/12/06  . History of abnormal Pap smear     s/p cryo( Dr. Wiliam Ke)  . Cervical lymphadenopathy     biopsy negativeMarvetta Gibbons)  . Normal vaginal delivery     x3  . Complete spontaneous abortion     x2  . Hypertension   . HSV (herpes simplex virus) anogenital infection   . GERD (gastroesophageal reflux disease)   . 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   . Vaginal Pap smear, abnormal    Past Surgical History  Procedure Laterality Date  . Biopsy of lymph node in r neck  2007.  Marland Kitchen [facility]     Family History  Problem Relation Age of Onset  . Ulcerative colitis Mother   . Hypertension Mother   . Diabetes Father   . Heart attack Father   . Hypertension Father   . Diabetes Sister   . Heart attack Maternal Grandmother    History  Substance Use Topics  . Smoking status: Never Smoker   . Smokeless tobacco: Never Used  . Alcohol Use: Yes     Comment: occasional beer and liquor on weekends   OB  History    Gravida Para Term Preterm AB TAB SAB Ectopic Multiple Living   6 3 2 1 3  3   3      Review of Systems  Constitutional: Negative for fever, activity change and fatigue.  HENT: Positive for postnasal drip, rhinorrhea and sore throat.   Eyes: Negative.   Respiratory: Positive for cough. Negative for shortness of breath and wheezing.   Cardiovascular: Negative for chest pain.  Gastrointestinal: Negative.   Musculoskeletal: Positive for neck pain. Negative for back pain and gait problem.  Neurological: Negative.   Psychiatric/Behavioral: Negative.     Allergies  Sulfamethoxazole-trimethoprim and Amoxicillin  Home Medications   Prior to Admission medications   Medication Sig Start Date End Date Taking? Authorizing Provider  albuterol (PROVENTIL HFA) 108 (90 BASE) MCG/ACT inhaler Inhale 2 puffs into the lungs every 6 (six) hours as needed for wheezing. 05/23/14 06/21/16 Yes 06/23/16, MD  amLODipine (NORVASC) 5 MG tablet Take 1 tablet (5 mg total) by mouth daily. 08/07/13  Yes 10/07/13, MD  cetirizine (ZYRTEC) 10 MG tablet Take 1 tablet (10 mg total) by mouth daily. Patient taking differently: Take 10 mg by mouth daily as needed for allergies or rhinitis.  11/02/13  Yes 11/04/13, MD  levonorgestrel-ethinyl estradiol (SEASONALE) 0.15-0.03 MG per tablet Take  1 tablet by mouth daily.     Yes Historical Provider, MD  cyclopentolate (CYCLODRYL,CYCLOGYL) 1 % ophthalmic solution Place 1 drop into the left eye 2 (two) times a week. 01/05/14   Historical Provider, MD  diclofenac sodium (VOLTAREN) 1 % GEL Apply 1 application topically 4 (four) times daily. 07/13/14   Hayden Rasmussen, NP  famotidine (PEPCID) 20 MG tablet Take 1 tablet (20 mg total) by mouth 2 (two) times daily. 05/23/14 05/23/15  Lorenda Hatchet, MD  loratadine (CLARITIN) 10 MG tablet Take 1 tablet (10 mg total) by mouth daily. 05/23/14   Lorenda Hatchet, MD  Multiple Vitamin (MULITIVITAMIN WITH MINERALS) TABS Take 1  tablet by mouth daily.    Historical Provider, MD  Naproxen Sodium (ALEVE) 220 MG CAPS Take 220 mg by mouth daily as needed (For headache.).    Historical Provider, MD  valACYclovir (VALTREX) 500 MG tablet Take 1 tablet by mouth twice a day for 3 days. 07/09/14   Brock Bad, MD   BP 132/84 mmHg  Pulse 83  Temp(Src) 98.1 F (36.7 C) (Oral)  Resp 16  SpO2 99%  LMP 07/10/2014 Physical Exam  Constitutional: She is oriented to person, place, and time. She appears well-developed and well-nourished. No distress.  HENT:  Mouth/Throat: No oropharyngeal exudate.  Bilateral TMs without erythema or effusion. Minor retraction. Oropharynx with moderate amount of clear PND, mild injection and cobblestoning.  Eyes: Conjunctivae and EOM are normal.  Neck: Neck supple.  Decreased range of motion due to pain in the right paracervical musculature. There is tenderness to the right splenius capitis and portion of the right trapezius medial portion of the ridge of the trapezius. No spinal tenderness, deformity, swelling or discoloration.  Cardiovascular: Normal rate, regular rhythm, normal heart sounds and intact distal pulses.   Pulmonary/Chest: Effort normal and breath sounds normal. No respiratory distress. She has no wheezes. She has no rales.  Lymphadenopathy:    She has no cervical adenopathy.  Neurological: She is alert and oriented to person, place, and time. She exhibits normal muscle tone.  Skin: Skin is warm and dry.  Psychiatric: She has a normal mood and affect.  Nursing note and vitals reviewed.   ED Course  Procedures (including critical care time) Labs Review Labs Reviewed - No data to display  Imaging Review No results found.   MDM   1. PND (post-nasal drip)   2. Pharyngitis   3. Allergic rhinitis due to pollen   4. Trapezius strain, right, initial encounter   5. Neck muscle strain, initial encounter    Consider trying Allegra or Claritin for drainage. Cepacol lozenges.  Ibuprofen 600 mg for sore throat pain and neck pain. Diclofenac gel 4 times a day as directed Heat and stretches as demonstrated Restart Flonase nasal spray daily    Hayden Rasmussen, NP 07/13/14 1156

## 2014-07-13 NOTE — ED Notes (Signed)
C/o pain on right side of neck and right ear pain onset 2 days Denies inj/trauma Taking tyle for pain w/temp relief Alert, no signs of acute distress.

## 2014-07-25 ENCOUNTER — Encounter: Payer: Self-pay | Admitting: *Deleted

## 2014-08-15 ENCOUNTER — Ambulatory Visit: Payer: Self-pay | Admitting: Certified Nurse Midwife

## 2014-08-16 ENCOUNTER — Ambulatory Visit (INDEPENDENT_AMBULATORY_CARE_PROVIDER_SITE_OTHER): Payer: Medicaid Other | Admitting: Obstetrics

## 2014-08-16 ENCOUNTER — Encounter: Payer: Self-pay | Admitting: Obstetrics

## 2014-08-16 VITALS — BP 122/78 | HR 79 | Temp 98.9°F | Ht 64.0 in | Wt 175.0 lb

## 2014-08-16 DIAGNOSIS — N393 Stress incontinence (female) (male): Secondary | ICD-10-CM

## 2014-08-16 DIAGNOSIS — Z124 Encounter for screening for malignant neoplasm of cervix: Secondary | ICD-10-CM

## 2014-08-16 DIAGNOSIS — A630 Anogenital (venereal) warts: Secondary | ICD-10-CM

## 2014-08-16 DIAGNOSIS — Z0000 Encounter for general adult medical examination without abnormal findings: Secondary | ICD-10-CM

## 2014-08-16 DIAGNOSIS — Z01419 Encounter for gynecological examination (general) (routine) without abnormal findings: Secondary | ICD-10-CM

## 2014-08-17 ENCOUNTER — Encounter: Payer: Self-pay | Admitting: Obstetrics

## 2014-08-17 LAB — HEPATITIS C ANTIBODY: HCV Ab: NEGATIVE

## 2014-08-17 LAB — RPR

## 2014-08-17 LAB — HIV ANTIBODY (ROUTINE TESTING W REFLEX): HIV 1&2 Ab, 4th Generation: NONREACTIVE

## 2014-08-17 LAB — HEPATITIS B SURFACE ANTIGEN: Hepatitis B Surface Ag: NEGATIVE

## 2014-08-17 NOTE — Progress Notes (Signed)
Subjective:        Sue [name] is a 39 y.o. female here for a routine exam.  Current complaints: none.    Personal health questionnaire:  Is patient [name], have a family history of breast and/or ovarian cancer: no Is there a family history of uterine cancer diagnosed at age < 21, gastrointestinal cancer, urinary tract cancer, family member who is a Personnel officer syndrome-associated carrier: no Is the patient overweight and hypertensive, family history of diabetes, personal history of gestational diabetes, preeclampsia or PCOS: no Is patient over 52, have PCOS,  family history of premature CHD under age 25, diabetes, smoke, have hypertension or peripheral artery disease:  no At any time, has a partner hit, kicked or otherwise hurt or frightened you?: no Over the past 2 weeks, have you felt down, depressed or hopeless?: no Over the past 2 weeks, have you felt little interest or pleasure in doing things?:no   Gynecologic History Patient's last menstrual period was 07/08/2014. Contraception: OCP (estrogen/progesterone) Last Pap: 2015. Results were: normal Last mammogram: . Results were: normal  Obstetric History OB History  Gravida Para Term Preterm AB SAB TAB Ectopic Multiple Living  6 3 2 1 3 3    3     # Outcome Date GA Lbr Len/2nd Weight Sex Delivery Anes PTL Lv  6 Term 07/06/05 [redacted]w[redacted]d  6 lb 12 oz (3.062 kg) M Vag-Spont None  Y  5 SAB 2006 [redacted]w[redacted]d       N  4 Term 03/27/96 [redacted]w[redacted]d  7 lb 12 oz (3.515 kg) M Vag-Spont None  Y  3 SAB 1996        N  2 Preterm 02/17/92 [redacted]w[redacted]d  3 lb 11 oz (1.673 kg) M Vag-Spont None  Y  1 SAB 1992        N      Past Medical History  Diagnosis Date  . Asthma     mild by PFT's 03/16/1998  . Sciatica 01/12/06  . History of abnormal Pap smear     s/p cryo( Dr. 13/6/07)  . Cervical lymphadenopathy     biopsy negativeWiliam Ke)  . Normal vaginal delivery     x3  . Complete spontaneous abortion     x2  . Hypertension   . HSV (herpes simplex virus)  anogenital infection   . GERD (gastroesophageal reflux disease)   . 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   . Vaginal Pap smear, abnormal     Past Surgical History  Procedure Laterality Date  . Biopsy of lymph node in r neck  2007.  2008 [facility]ep       Current outpatient prescriptions:  .  albuterol (PROVENTIL HFA) 108 (90 BASE) MCG/ACT inhaler, Inhale 2 puffs into the lungs every 6 (six) hours as needed for wheezing., Disp: 1 Inhaler, Rfl: 6 .  amLODipine (NORVASC) 5 MG tablet, Take 1 tablet (5 mg total) by mouth daily., Disp: 30 tablet, Rfl: 11 .  cetirizine (ZYRTEC) 10 MG tablet, Take 1 tablet (10 mg total) by mouth daily. (Patient taking differently: Take 10 mg by mouth daily as needed for allergies or rhinitis. ), Disp: 30 tablet, Rfl: 6 .  famotidine (PEPCID) 20 MG tablet, Take 1 tablet (20 mg total) by mouth 2 (two) times daily., Disp: 60 tablet, Rfl: 0 .  levonorgestrel-ethinyl estradiol (SEASONALE) 0.15-0.03 MG  per tablet, Take 1 tablet by mouth daily.  , Disp: , Rfl:  .  loratadine (CLARITIN) 10 MG tablet, Take 1 tablet (10 mg total) by mouth daily., Disp: 30 tablet, Rfl: 0 .  meloxicam (MOBIC) 7.5 MG tablet, Take 7.5 mg by mouth daily., Disp: , Rfl:  .  mesalamine (CANASA) 1000 MG suppository, Place 1,000 mg rectally at bedtime., Disp: , Rfl:  .  Multiple Vitamin (MULITIVITAMIN WITH MINERALS) TABS, Take 1 tablet by mouth daily., Disp: , Rfl:  .  Naproxen Sodium (ALEVE) 220 MG CAPS, Take 220 mg by mouth daily as needed (For headache.)., Disp: , Rfl:  .  valACYclovir (VALTREX) 500 MG tablet, Take 1 tablet by mouth twice a day for 3 days., Disp: 30 tablet, Rfl: prn .  cyclopentolate (CYCLODRYL,CYCLOGYL) 1 % ophthalmic solution, Place 1 drop into the left eye 2 (two) times a week., Disp: , Rfl: 0 .  diclofenac sodium (VOLTAREN) 1 % GEL, Apply  1 application topically 4 (four) times daily. (Patient not taking: Reported on 08/16/2014), Disp: 100 g, Rfl: 0 Allergies  Allergen Reactions  . Sulfamethoxazole-Trimethoprim Swelling    Swelling is of the lips.  . Amoxicillin Rash    History  Substance Use Topics  . Smoking status: Never Smoker   . Smokeless tobacco: Never Used  . Alcohol Use: 0.0 oz/week    0 Standard drinks or equivalent per week     Comment: occasional beer and liquor on weekends    Family History  Problem Relation Age of Onset  . Ulcerative colitis Mother   . Hypertension Mother   . Diabetes Father   . Heart attack Father   . Hypertension Father   . Diabetes Sister   . Heart attack Maternal Grandmother       Review of Systems  Constitutional: negative for fatigue and weight loss Respiratory: negative for cough and wheezing Cardiovascular: negative for chest pain, fatigue and palpitations Gastrointestinal: negative for abdominal pain and change in bowel habits Musculoskeletal:negative for myalgias Neurological: negative for gait problems and tremors Behavioral/Psych: negative for abusive relationship, depression Endocrine: negative for temperature intolerance   Genitourinary:negative for abnormal menstrual periods.  Positive for genital lesions Integument/breast: negative for breast lump, breast tenderness, nipple discharge and skin lesion(s)    Objective:       BP 122/78 mmHg  Pulse 79  Temp(Src) 98.9 F (37.2 C)  Ht 5\' 4"  (1.626 m)  Wt 175 lb (79.379 kg)  BMI 30.02 kg/m2  LMP 07/08/2014 General:   alert  Skin:   small condyloma in perineal area  Lungs:   clear to auscultation bilaterally  Heart:   regular rate and rhythm, S1, S2 normal, no murmur, click, rub or gallop  Breasts:   normal without suspicious masses, skin or nipple changes or axillary nodes  Abdomen:  normal findings: no organomegaly, soft, non-tender and no hernia  Pelvis:  External genitalia: normal general appearance,  small perineal condyloma Urinary system: urethral meatus normal and bladder without fullness, nontender Vaginal: normal without tenderness, induration or masses Cervix: normal appearance Adnexa: normal bimanual exam Uterus: anteverted and non-tender, normal size   Lab Review Urine pregnancy test Labs reviewed yes Radiologic studies reviewed yes   Assessment:    Healthy female exam.    Condyloma of perineum  SUI  Contraceptive Management    Plan:   TCA 80% solution applied to wart Urine culture done and patient referred to Urology for SUI Continue OCP's   Education reviewed: depression evaluation, low  fat, low cholesterol diet, safe sex/STD prevention, self breast exams and weight bearing exercise. Contraception: OCP (estrogen/progesterone). Follow up in: 1 year.   Meds ordered this encounter  Medications  . meloxicam (MOBIC) 7.5 MG tablet    Sig: Take 7.5 mg by mouth daily.  . mesalamine (CANASA) 1000 MG suppository    Sig: Place 1,000 mg rectally at bedtime.   Orders Placed This Encounter  Procedures  . SureSwab, Vaginosis/Vaginitis Plus  . HIV antibody  . Hepatitis B surface antigen  . RPR  . Hepatitis C antibody

## 2014-08-20 LAB — SURESWAB, VAGINOSIS/VAGINITIS PLUS
Atopobium vaginae: NOT DETECTED Log (cells/mL)
C. albicans, DNA: NOT DETECTED
C. glabrata, DNA: NOT DETECTED
C. parapsilosis, DNA: NOT DETECTED
C. trachomatis RNA, TMA: NOT DETECTED
C. tropicalis, DNA: NOT DETECTED
Gardnerella vaginalis: NOT DETECTED Log (cells/mL)
LACTOBACILLUS SPECIES: 7.2 Log (cells/mL)
MEGASPHAERA SPECIES: NOT DETECTED Log (cells/mL)
N. gonorrhoeae RNA, TMA: NOT DETECTED
T. vaginalis RNA, QL TMA: NOT DETECTED

## 2014-08-20 LAB — PAP IG AND HPV HIGH-RISK: HPV DNA High Risk: NOT DETECTED

## 2014-08-24 ENCOUNTER — Telehealth: Payer: Self-pay

## 2014-08-24 NOTE — Telephone Encounter (Signed)
patient has appt with [facility] on 09/13/14 at 1:45pm - left vm

## 2014-09-12 ENCOUNTER — Telehealth: Payer: Self-pay | Admitting: *Deleted

## 2014-09-12 ENCOUNTER — Encounter: Payer: Self-pay | Admitting: Internal Medicine

## 2014-09-12 ENCOUNTER — Ambulatory Visit (INDEPENDENT_AMBULATORY_CARE_PROVIDER_SITE_OTHER): Payer: Medicaid Other | Admitting: Internal Medicine

## 2014-09-12 VITALS — BP 135/85 | HR 99 | Temp 98.5°F | Ht 64.0 in | Wt 174.6 lb

## 2014-09-12 DIAGNOSIS — I10 Essential (primary) hypertension: Secondary | ICD-10-CM | POA: Diagnosis not present

## 2014-09-12 DIAGNOSIS — Z0000 Encounter for general adult medical examination without abnormal findings: Secondary | ICD-10-CM | POA: Insufficient documentation

## 2014-09-12 DIAGNOSIS — G44209 Tension-type headache, unspecified, not intractable: Secondary | ICD-10-CM | POA: Diagnosis present

## 2014-09-12 MED ORDER — NAPROXEN SODIUM 220 MG PO CAPS: 220.0000 mg | ORAL_CAPSULE | Freq: Two times a day (BID) | ORAL | Status: DC | PRN

## 2014-09-12 MED ORDER — CYCLOBENZAPRINE HCL 5 MG PO TABS: 5.0000 mg | ORAL_TABLET | Freq: Three times a day (TID) | ORAL | Status: DC | PRN

## 2014-09-12 NOTE — Assessment & Plan Note (Signed)
Assessment: Pt with well-controlled hypertension compliant with one-class (CCB) anti-hypertensive therapy who presents with blood pressure of 135/85.   Plan:  -BP 135/85 at goal <140/90 -Continue amlodipine 5 mg daily  -Last BMP on 04/06/14 with Cr 0.98 and GFR 84, repeat at next visit

## 2014-09-12 NOTE — Patient Instructions (Addendum)
-  Take naproxen 220 mg twice a day as needed for pain -Take flexeril 5 mg three times daily as needed for muscle spasms, don't drive when you take this medication as it may make you drowsy -Try heating pads to ease up your neck muscles -Please come back if your headache does not get better -Very nice meeting you!   Tension Headache A tension headache is pain, pressure, or aching felt over the front and sides of the head. Tension headaches often come after stress, feeling worried (anxiety), or feeling sad or down for a while (depressed). HOME CARE  Only take medicine as told by your doctor.  Lie down in a dark, quiet room when you have a headache.  Keep a journal to find out if certain things bring on headaches. For example, write down:  What you eat and drink.  How much sleep you get.  Any change to your diet or medicines.  Relax by getting a massage or doing other relaxing activities.  Put ice or heat packs on the head and neck area as told by your doctor.  Lessen stress.  Sit up straight. Do not tighten (tense) your muscles.  Quit smoking if you smoke.  Lessen how much alcohol you drink.  Lessen how much caffeine you drink, or stop drinking caffeine.  Eat and exercise regularly.  Get enough sleep.  Avoid using too much pain medicine. GET HELP RIGHT AWAY IF:   Your headache becomes really bad.  You have a fever.  You have a stiff neck.  You have trouble seeing.  Your muscles are weak, or you lose muscle control.  You lose your balance or have trouble walking.  You feel like you will pass out (faint), or you pass out.  You have really bad symptoms that are different than your first symptoms.  You have problems with the medicines given to you by your doctor.  Your medicines do not work.  Your headache feels different than the other headaches.  You feel sick to your stomach (nauseous) or throw up (vomit). MAKE SURE YOU:   Understand these  instructions.  Will watch your condition.  Will get help right away if you are not doing well or get worse. Document Released: 05/20/2009 Document Revised: 05/18/2011 Document Reviewed: 02/13/2011 [facility] Patient Information 2015 [place], [place]. This information is not intended to replace advice given to you by your health care provider. Make sure you discuss any questions you have with your health care provider.  General Instructions:   Please bring your medicines with you each time you come to clinic.  Medicines may include prescription medications, over-the-counter medications, herbal remedies, eye drops, vitamins, or other pills.   Progress Toward Treatment Goals:  Treatment Goal 05/23/2014  Blood pressure at goal    Self Care Goals & Plans:  Self Care Goal 09/12/2014  Manage my medications take my medicines as prescribed; bring my medications to every visit; refill my medications on time; follow the sick day instructions if I am sick  Monitor my health keep track of my blood pressure; keep track of my weight  Eat healthy foods eat more vegetables; eat fruit for snacks and desserts  Be physically active find an activity I enjoy    No flowsheet data found.   Care Management & Community Referrals:  No flowsheet data found.

## 2014-09-12 NOTE — Progress Notes (Signed)
Case discussed with Dr. Rabbani. We reviewed the resident's history and exam and pertinent patient test results.  I agree with the assessment, diagnosis, and plan of care documented in the resident's note. 

## 2014-09-12 NOTE — Assessment & Plan Note (Signed)
Inquire regarding tdap vaccination at next visit.

## 2014-09-12 NOTE — Assessment & Plan Note (Addendum)
Assessment: Pt with four-day history of tension headache in setting of probable cervical muscle strain and resulting spasms who presents with no alarm symptoms.    Plan:  -Start flexeril 5 mg TID PRN muscle spasm -Start naproxen 220 BID PRN pain/inflammation -Pt instructed to use heating pad on neck -Pt instructed to return if symptoms do not improve or worsen

## 2014-09-12 NOTE — Progress Notes (Signed)
Patient ID: Sue [name], female   DOB: [birth year], 39 y.o.   MRN: [id number]    Subjective:   Patient ID: Sue [name] female   DOB: 05/20/1975 39 y.o.   MRN: [id number]  HPI: Sue [name] is a 39 y.o. pleasant woman with past medical history of hypertension, asthma, allergic rhinitis, ulcerative proctitis, HSV infection, chronic low back pain, and GERD who presents with chief complaint of headache.  She reports acute onset of a throbbing and constant posterior-located headache that began four days ago when she woke up in the morning. It is associated with neck stiffness/pain and limited range of motion of her neck. She denies fever, chills, URI symptoms, vision change, weakness, paresthesias, nausea/vomiting, photosensitivity, or head trauma. She drinks soda regularly and has not cut back recently. She has history of tension headache in 2013 that was thought to be due to neck strain that resolved with NSAID and muscle relaxant therapy after a few days. She has been using OTC tylenol 1500 mg daily, heat pack, and massage with mild relief.   She is compliant with taking norvasc 5 mg daily for hypertension. She denies lightheadedness, chest pain, or LE edema.     Past Medical History  Diagnosis Date  . Asthma     mild by PFT's 03/16/1998  . Sciatica 01/12/06  . History of abnormal Pap smear     s/p cryo( Dr. Wiliam Ke)  . Cervical lymphadenopathy     biopsy negativeMarvetta Gibbons)  . Normal vaginal delivery     x3  . Complete spontaneous abortion     x2  . Hypertension   . HSV (herpes simplex virus) anogenital infection   . GERD (gastroesophageal reflux disease)   . 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   . Vaginal Pap smear, abnormal    Current Outpatient Prescriptions  Medication Sig Dispense Refill  . albuterol (PROVENTIL HFA) 108 (90  BASE) MCG/ACT inhaler Inhale 2 puffs into the lungs every 6 (six) hours as needed for wheezing. 1 Inhaler 6  . amLODipine (NORVASC) 5 MG tablet Take 1 tablet (5 mg total) by mouth daily. 30 tablet 11  . cetirizine (ZYRTEC) 10 MG tablet Take 1 tablet (10 mg total) by mouth daily. (Patient taking differently: Take 10 mg by mouth daily as needed for allergies or rhinitis. ) 30 tablet 6  . cyclopentolate (CYCLODRYL,CYCLOGYL) 1 % ophthalmic solution Place 1 drop into the left eye 2 (two) times a week.  0  . diclofenac sodium (VOLTAREN) 1 % GEL Apply 1 application topically 4 (four) times daily. (Patient not taking: Reported on 08/16/2014) 100 g 0  . famotidine (PEPCID) 20 MG tablet Take 1 tablet (20 mg total) by mouth 2 (two) times daily. 60 tablet 0  . levonorgestrel-ethinyl estradiol (SEASONALE) 0.15-0.03 MG per tablet Take 1 tablet by mouth daily.      Marland Kitchen loratadine (CLARITIN) 10 MG tablet Take 1 tablet (10 mg total) by mouth daily. 30 tablet 0  . meloxicam (MOBIC) 7.5 MG tablet Take 7.5 mg by mouth daily.    . mesalamine (CANASA) 1000 MG suppository Place 1,000 mg rectally at bedtime.    . Multiple Vitamin (MULITIVITAMIN WITH MINERALS) TABS Take 1 tablet by mouth daily.    . Naproxen Sodium (ALEVE) 220 MG CAPS Take 220 mg by mouth daily as needed (For headache.).    Marland Kitchen  valACYclovir (VALTREX) 500 MG tablet Take 1 tablet by mouth twice a day for 3 days. 30 tablet prn   No current facility-administered medications for this visit.   Family History  Problem Relation Age of Onset  . Ulcerative colitis Mother   . Hypertension Mother   . Diabetes Father   . Heart attack Father   . Hypertension Father   . Diabetes Sister   . Heart attack Maternal Grandmother    History   Social History  . Marital Status: Single    Spouse Name: N/A  . Number of Children: N/A  . Years of Education: N/A   Social History Main Topics  . Smoking status: Never Smoker   . Smokeless tobacco: Never Used  . Alcohol Use:  0.0 oz/week    0 Standard drinks or equivalent per week     Comment: occasional beer and liquor on weekends  . Drug Use: No  . Sexual Activity: Not Currently    Birth Control/ Protection: Pill   Other Topics Concern  . Not on file   Social History Narrative   Review of Systems: Review of Systems  Constitutional: Negative for fever and chills.  HENT: Negative for congestion.   Eyes: Negative for blurred vision, double vision and photophobia.  Respiratory: Negative for cough, shortness of breath and wheezing.   Cardiovascular: Negative for chest pain and leg swelling.  Gastrointestinal: Negative for nausea, vomiting, abdominal pain, diarrhea and constipation.  Genitourinary: Negative for dysuria, urgency, frequency and hematuria.  Neurological: Positive for headaches (since 4 days ago). Negative for dizziness, sensory change and focal weakness.    Objective:  Physical Exam: Filed Vitals:   09/12/14 1601 09/12/14 1602  BP:  135/85  Pulse:  99  Temp:  98.5 F (36.9 C)  TempSrc:  Oral  Height: 5\' 4"  (1.626 m)   Weight: 174 lb 9.6 oz (79.198 kg)   SpO2:  100%   Physical Exam  Constitutional: She is oriented to person, place, and time. She appears well-developed and well-nourished. No distress.  HENT:  Head: Normocephalic and atraumatic.  Right Ear: External ear normal.  Left Ear: External ear normal.  Nose: Nose normal.  Mouth/Throat: Oropharynx is clear and moist. No oropharyngeal exudate.  Eyes: Conjunctivae and EOM are normal. Pupils are equal, round, and reactive to light. Right eye exhibits no discharge. Left eye exhibits no discharge. No scleral icterus.  Neck: Neck supple.  Left sided muscle tension with limited ROM.  Cardiovascular: Normal rate, regular rhythm and normal heart sounds.   No murmur heard. Pulmonary/Chest: Effort normal and breath sounds normal. No respiratory distress. She has no wheezes. She has no rales.  Abdominal: Soft. Bowel sounds are normal.  She exhibits no distension. There is no tenderness. There is no rebound and no guarding.  Musculoskeletal: Normal range of motion. She exhibits no edema or tenderness.  Neurological: She is alert and oriented to person, place, and time. No cranial nerve deficit.  Normal 5/5 muscle strength. Normal sensation to light touch of extremities.   Skin: Skin is warm and dry. No rash noted. She is not diaphoretic. No erythema. No pallor.  Psychiatric: She has a normal mood and affect. Her behavior is normal. Judgment and thought content normal.    Assessment & Plan:   Please see problem list for problem-based assessment and plan

## 2014-09-12 NOTE — Telephone Encounter (Signed)
Pt calls and states for 3 days she has a h/a in the back of her head, neck and ears. 8/10. Hurts to move head, sit up in bed, constant. Used otc tylenol with no relief. Taking liquids well she states but encouraged to stay well hydrated. States light does not bother her, slight nausea, no vomiting, no vision changes, no gait changes, no strength changes, no shortness of breath, no chest pain. Scheduled appt today 1515 dr Johna Roles. She is advised greatly that if she has shortness of breath, chest pain, changes in strength, thought process, vision, gait, facial droop, speech to call 911 and come to [place], she is agreeable

## 2014-09-12 NOTE — Telephone Encounter (Signed)
Agree with appt Thanks 

## 2014-10-31 ENCOUNTER — Ambulatory Visit (INDEPENDENT_AMBULATORY_CARE_PROVIDER_SITE_OTHER): Payer: Medicaid Other | Admitting: Internal Medicine

## 2014-10-31 ENCOUNTER — Encounter: Payer: Self-pay | Admitting: Internal Medicine

## 2014-10-31 ENCOUNTER — Encounter: Payer: Self-pay | Admitting: Obstetrics

## 2014-10-31 ENCOUNTER — Ambulatory Visit (INDEPENDENT_AMBULATORY_CARE_PROVIDER_SITE_OTHER): Payer: Medicaid Other | Admitting: Obstetrics

## 2014-10-31 VITALS — BP 121/81 | HR 73 | Temp 98.6°F | Wt 173.1 lb

## 2014-10-31 VITALS — BP 123/71 | HR 76 | Temp 98.7°F | Wt 172.5 lb

## 2014-10-31 DIAGNOSIS — Z0000 Encounter for general adult medical examination without abnormal findings: Secondary | ICD-10-CM

## 2014-10-31 DIAGNOSIS — N750 Cyst of Bartholin's gland: Secondary | ICD-10-CM | POA: Diagnosis not present

## 2014-10-31 DIAGNOSIS — R221 Localized swelling, mass and lump, neck: Secondary | ICD-10-CM | POA: Diagnosis present

## 2014-10-31 DIAGNOSIS — I10 Essential (primary) hypertension: Secondary | ICD-10-CM

## 2014-10-31 DIAGNOSIS — Z23 Encounter for immunization: Secondary | ICD-10-CM | POA: Diagnosis not present

## 2014-10-31 NOTE — Progress Notes (Signed)
WORD CATHETER PROCEDURE NOTE  A time-out was performed confirming the procedure and allergy status.  The skin was prepped.  The skin was infiltrated with 1% lidocaine plain.  A word catheter was inserted through a stab incision into the abscess.  Coral Ceo MD   Patient ID: Sue [name], female   DOB: 1975-12-12, 39 y.o.   MRN: [id number]  No chief complaint on file.   HPI Sue [name] is a 39 y.o. female.  Swelling of left lower side of vagina, non tender, ~ 8 day duration.  Denies fever, chills or vaginal discharge.  HPI  Past Medical History  Diagnosis Date  . Asthma     mild by PFT's 03/16/1998  . Sciatica 01/12/06  . History of abnormal Pap smear     s/p cryo( Dr. Wiliam Ke)  . Cervical lymphadenopathy     biopsy negativeMarvetta Gibbons)  . Normal vaginal delivery     x3  . Complete spontaneous abortion     x2  . Hypertension   . HSV (herpes simplex virus) anogenital infection   . GERD (gastroesophageal reflux disease)   . 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   . Vaginal Pap smear, abnormal     Past Surgical History  Procedure Laterality Date  . Biopsy of lymph node in r neck  2007.  Marland Kitchen Leep      Family History  Problem Relation Age of Onset  . Ulcerative colitis Mother   . Hypertension Mother   . Diabetes Father   . Heart attack Father   . Hypertension Father   . Diabetes Sister   . Heart attack Maternal Grandmother     Social History Social History  Substance Use Topics  . Smoking status: Never Smoker   . Smokeless tobacco: Never Used  . Alcohol Use: 0.0 oz/week    0 Standard drinks or equivalent per week     Comment: occasional beer and liquor on weekends    Allergies  Allergen Reactions  . Sulfamethoxazole-Trimethoprim Swelling    Swelling is of the lips.  . Amoxicillin Rash    Current  Outpatient Prescriptions  Medication Sig Dispense Refill  . albuterol (PROVENTIL HFA) 108 (90 BASE) MCG/ACT inhaler Inhale 2 puffs into the lungs every 6 (six) hours as needed for wheezing. 1 Inhaler 6  . amLODipine (NORVASC) 5 MG tablet Take 1 tablet (5 mg total) by mouth daily. 30 tablet 11  . cetirizine (ZYRTEC) 10 MG tablet Take 1 tablet (10 mg total) by mouth daily. (Patient taking differently: Take 10 mg by mouth daily as needed for allergies or rhinitis. ) 30 tablet 6  . cyclobenzaprine (FLEXERIL) 5 MG tablet Take 1 tablet (5 mg total) by mouth 3 (three) times daily as needed for muscle spasms. 30 tablet 0  . cyclopentolate (CYCLODRYL,CYCLOGYL) 1 % ophthalmic solution Place 1 drop into the left eye 2 (two) times a week.  0  . famotidine (PEPCID) 20 MG tablet Take 1 tablet (20 mg total) by mouth 2 (two) times daily. 60 tablet 0  . levonorgestrel-ethinyl estradiol (SEASONALE) 0.15-0.03 MG per tablet Take 1 tablet by mouth daily.      Marland Kitchen loratadine (CLARITIN) 10 MG tablet Take 1 tablet (10 mg total) by mouth daily. 30 tablet 0  . mesalamine (CANASA) 1000 MG suppository Place 1,000 mg rectally at bedtime.    Marland Kitchen  Multiple Vitamin (MULITIVITAMIN WITH MINERALS) TABS Take 1 tablet by mouth daily.    . Naproxen Sodium (ALEVE) 220 MG CAPS Take 1 capsule (220 mg total) by mouth 2 (two) times daily as needed (For headache.). 60 each 0  . valACYclovir (VALTREX) 500 MG tablet Take 1 tablet by mouth twice a day for 3 days. 30 tablet prn   No current facility-administered medications for this visit.    Review of Systems Review of Systems Constitutional: negative for fatigue and weight loss Respiratory: negative for cough and wheezing Cardiovascular: negative for chest pain, fatigue and palpitations Gastrointestinal: negative for abdominal pain and change in bowel habits Genitourinary: positive for swelling of left lower vaginal area Integument/breast: negative for nipple  discharge Musculoskeletal:negative for myalgias Neurological: negative for gait problems and tremors Behavioral/Psych: negative for abusive relationship, depression Endocrine: negative for temperature intolerance     Blood pressure 121/81, pulse 73, temperature 98.6 F (37 C), weight 173 lb 1.6 oz (78.518 kg).  Physical Exam Physical Exam           General:  Alert and no distress Abdomen:  normal findings: no organomegaly, soft, non-tender and no hernia  Pelvis:  External genitalia: Left Bartholin's Cyst.  Non tender Urinary system: urethral meatus normal and bladder without fullness, nontender Vaginal: normal without tenderness, induration or masses Cervix: normal appearance Adnexa: normal bimanual exam Uterus: anteverted and non-tender, normal size      Data Reviewed Labs  Assessment     Left Bartholin's Cyst     Plan    I&D of Left Bartholin's Cyst with insertion of Word Catheter ( see procedure note ) Doxycycline Rx F/U in 2 weeks   No orders of the defined types were placed in this encounter.   No orders of the defined types were placed in this encounter.

## 2014-10-31 NOTE — Patient Instructions (Signed)
-  Will check your bloodwork today and call you with the results  -Will set you up for an ultrasound of your neck and call you with the results  -Will give you tetanus and flu shots today -Nice seeing you again!     General Instructions:   Please bring your medicines with you each time you come to clinic.  Medicines may include prescription medications, over-the-counter medications, herbal remedies, eye drops, vitamins, or other pills.   Progress Toward Treatment Goals:  Treatment Goal 05/23/2014  Blood pressure at goal    Self Care Goals & Plans:  Self Care Goal 09/12/2014  Manage my medications take my medicines as prescribed; bring my medications to every visit; refill my medications on time; follow the sick day instructions if I am sick  Monitor my health keep track of my blood pressure; keep track of my weight  Eat healthy foods eat more vegetables; eat fruit for snacks and desserts  Be physically active find an activity I enjoy    No flowsheet data found.   Care Management & Community Referrals:  No flowsheet data found.

## 2014-10-31 NOTE — Assessment & Plan Note (Addendum)
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     Plan: -Obtain TSH level  -Obtain CBC w/diff  -HIV Ab on 08/16/14 was non-reactive  -Obtain US soft tissue neck for further evaluation -Pt instructed to take previously prescribed naproxen 220 mg BID PRN pain/infammation -Pt instructed to go to the ED if swelling worsens with obstructive/respiratory symptoms    ADDENDUM on 9/7/1/6: Pt's throat swelling improved after was recently prescribed doxycyline by gynecology for left bartholin's cyst on 10/31/14. Pt no longer wants US soft tissue neck, will therefore cancel.

## 2014-10-31 NOTE — Assessment & Plan Note (Signed)
-  Pt received annual influenza vaccination today on 10/31/14 -Pt received tdap vaccination today on 10/31/14

## 2014-10-31 NOTE — Assessment & Plan Note (Signed)
Assessment: Pt with well-controlled hypertension compliant with one-class (CCB) anti-hypertensive therapy who presents with blood pressure of 123/71.   Plan:  -BP 123/71 at goal <140/90 -Continue amlodipine 5 mg daily  -Obtain CMP

## 2014-10-31 NOTE — Progress Notes (Signed)
Patient ID: Sue [name], female   DOB: 1975-03-28, 39 y.o.   MRN: [id number]    Subjective:   Patient ID: Sue [name] female   DOB: 21-Dec-1975 39 y.o.   MRN: [id number]  HPI: [name] is a 39 y.o. pleasant woman with past medical history of hypertension, asthma, allergic rhinitis, ulcerative proctitis, HSV infection, chronic low back pain, and GERD who presents with chief complaint of throat swelling.   She reports having throat swelling and pain with neck enlargement that started 2 days ago without preceding trauma or injury. She also has associated left sided movable tender lymph node, mild voice hoarseness, and stiffness of her neck when she moves it to the left. She denies history of thyroid problems and last TSH level last year was normal. She denies fever, chills, recent illness, sore throat, neck pain, headache, lip/tongue swelling, dysphagia, odynophagia, choking, other enlarged lymph nodes, weight loss, night sweating, fatigue, or history of anaphylaxis. She not taken anything for the pain but was taking OTC allergy medication with no relief. She has history of persistent (58-month) enlarged 1.5 cm - 2 cm right mid-jugular lymph node that required excision biopsy on 08/15/04 with biopsy results unremarkable for lymphoma.   She is compliant with taking norvasc 5 mg daily for hypertension. She denies blurry vision, lightheadedness, chest pain, or LE edema.   She would like to have tdap and flu shots today.      Past Medical History  Diagnosis Date  . Asthma     mild by PFT's 03/16/1998  . Sciatica 01/12/06  . History of abnormal Pap smear     s/p cryo( Dr. Wiliam Ke)  . Cervical lymphadenopathy     biopsy negativeMarvetta Gibbons)  . Normal vaginal delivery     x3  . Complete spontaneous abortion     x2  . Hypertension   . HSV (herpes simplex virus) anogenital infection   . GERD (gastroesophageal reflux disease)   . 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   . Vaginal Pap smear, abnormal    Current Outpatient Prescriptions  Medication Sig Dispense Refill  . albuterol (PROVENTIL HFA) 108 (90 BASE) MCG/ACT inhaler Inhale 2 puffs into the lungs every 6 (six) hours as needed for wheezing. 1 Inhaler 6  . amLODipine (NORVASC) 5 MG tablet Take 1 tablet (5 mg total) by mouth daily. 30 tablet 11  . cetirizine (ZYRTEC) 10 MG tablet Take 1 tablet (10 mg total) by mouth daily. (Patient taking differently: Take 10 mg by mouth daily as needed for allergies or rhinitis. ) 30 tablet 6  . cyclobenzaprine (FLEXERIL) 5 MG tablet Take 1 tablet (5 mg total) by mouth 3 (three) times daily as needed for muscle spasms. 30 tablet 0  . cyclopentolate (CYCLODRYL,CYCLOGYL) 1 % ophthalmic solution Place 1 drop into the left eye 2 (two) times a week.  0  . famotidine (PEPCID) 20 MG tablet Take 1 tablet (20 mg total) by mouth 2 (two) times daily. 60 tablet 0  . levonorgestrel-ethinyl estradiol (SEASONALE) 0.15-0.03 MG per tablet Take 1 tablet by mouth daily.      Marland Kitchen loratadine (CLARITIN) 10 MG tablet Take 1 tablet (10 mg total) by mouth daily. 30 tablet 0  . mesalamine (CANASA) 1000 MG suppository Place 1,000 mg rectally at bedtime.    . Multiple Vitamin (MULITIVITAMIN WITH MINERALS)  TABS Take 1 tablet by mouth daily.    . Naproxen Sodium (ALEVE) 220 MG CAPS Take 1 capsule (220 mg total) by mouth 2 (two) times daily as needed (For headache.). 60 each 0  . valACYclovir (VALTREX) 500 MG tablet Take 1 tablet by mouth twice a day for 3 days. 30 tablet prn   No current facility-administered medications for this visit.   Family History  Problem Relation Age of Onset  . Ulcerative colitis Mother   . Hypertension Mother   . Diabetes Father   . Heart attack Father   . Hypertension Father   . Diabetes Sister   . Heart attack Maternal Grandmother    Social  History   Social History  . Marital Status: Single    Spouse Name: N/A  . Number of Children: N/A  . Years of Education: N/A   Social History Main Topics  . Smoking status: Never Smoker   . Smokeless tobacco: Never Used  . Alcohol Use: 0.0 oz/week    0 Standard drinks or equivalent per week     Comment: occasional beer and liquor on weekends  . Drug Use: No  . Sexual Activity: Not Currently    Birth Control/ Protection: Pill   Other Topics Concern  . Not on file   Social History Narrative   Review of Systems: Review of Systems  Constitutional: Negative for fever, chills and weight loss.  HENT: Negative for congestion and sore throat.        Throat swelling and pain for past 2 days   Eyes: Negative for blurred vision.  Respiratory: Negative for cough, shortness of breath and wheezing.   Cardiovascular: Positive for palpitations (occasionally). Negative for chest pain and leg swelling.  Gastrointestinal: Positive for nausea. Negative for vomiting, abdominal pain, diarrhea, constipation and blood in stool.  Genitourinary: Negative for dysuria, urgency, frequency and hematuria.       Labial abscess vs boil  Musculoskeletal:       Left-sided neck stiffness  Neurological: Negative for dizziness and headaches.     Objective:  Physical Exam: Filed Vitals:   10/31/14 0946  BP: 123/71  Pulse: 76  Temp: 98.7 F (37.1 C)  TempSrc: Oral  Weight: 172 lb 8 oz (78.245 kg)  SpO2: 100%    Physical Exam  Constitutional: She is oriented to person, place, and time. She appears well-developed and well-nourished. No distress.  HENT:  Head: Normocephalic and atraumatic.  Right Ear: External ear normal.  Left Ear: External ear normal.  Nose: Nose normal.  Mouth/Throat: Oropharynx is clear and moist. No oropharyngeal exudate.  Eyes: Conjunctivae and EOM are normal. Pupils are equal, round, and reactive to light. Right eye exhibits no discharge. Left eye exhibits no discharge. No  scleral icterus.  Neck: Normal range of motion. Neck supple. No thyromegaly present.  Cardiovascular: Normal rate, regular rhythm and normal heart sounds.   Pulmonary/Chest: Effort normal and breath sounds normal. No respiratory distress. She has no wheezes. She has no rales.  Abdominal: Soft. Bowel sounds are normal. She exhibits no distension. There is no tenderness. There is no rebound and no guarding.  Musculoskeletal: Normal range of motion. She exhibits no edema or tenderness.  Lymphadenopathy:    She has cervical adenopathy (approx 0.5 cm left sided mobile tender lymph node).  Neurological: She is alert and oriented to person, place, and time.  Skin: Skin is warm and dry. No rash noted. She is not diaphoretic. No erythema. No pallor.  Psychiatric: She  has a normal mood and affect. Her behavior is normal. Judgment and thought content normal.    Assessment & Plan:   Please see problem list for problem-based assessment and plan

## 2014-11-01 LAB — CBC WITH DIFFERENTIAL/PLATELET
Basophils Absolute: 0 10*3/uL (ref 0.0–0.2)
Basos: 0 %
EOS (ABSOLUTE): 0.2 10*3/uL (ref 0.0–0.4)
Eos: 3 %
Hematocrit: 37.8 % (ref 34.0–46.6)
Hemoglobin: 12.7 g/dL (ref 11.1–15.9)
Immature Grans (Abs): 0 10*3/uL (ref 0.0–0.1)
Immature Granulocytes: 0 %
Lymphocytes Absolute: 2 10*3/uL (ref 0.7–3.1)
Lymphs: 32 %
MCH: 28.9 pg (ref 26.6–33.0)
MCHC: 33.6 g/dL (ref 31.5–35.7)
MCV: 86 fL (ref 79–97)
Monocytes Absolute: 0.4 10*3/uL (ref 0.1–0.9)
Monocytes: 7 %
Neutrophils Absolute: 3.5 10*3/uL (ref 1.4–7.0)
Neutrophils: 58 %
Platelets: 361 10*3/uL (ref 150–379)
RBC: 4.4 x10E6/uL (ref 3.77–5.28)
RDW: 14 % (ref 12.3–15.4)
WBC: 6.1 10*3/uL (ref 3.4–10.8)

## 2014-11-01 LAB — COMPREHENSIVE METABOLIC PANEL
ALT: 25 IU/L (ref 0–32)
AST: 14 IU/L (ref 0–40)
Albumin/Globulin Ratio: 1.1 (ref 1.1–2.5)
Albumin: 3.7 g/dL (ref 3.5–5.5)
Alkaline Phosphatase: 94 IU/L (ref 39–117)
BUN/Creatinine Ratio: 12 (ref 8–20)
BUN: 11 mg/dL (ref 6–20)
Bilirubin Total: 0.3 mg/dL (ref 0.0–1.2)
CO2: 23 mmol/L (ref 18–29)
Calcium: 9.1 mg/dL (ref 8.7–10.2)
Chloride: 103 mmol/L (ref 97–108)
Creatinine, Ser: 0.91 mg/dL (ref 0.57–1.00)
GFR calc Af Amer: 93 mL/min/{1.73_m2} (ref >59)
GFR calc non Af Amer: 80 mL/min/{1.73_m2} (ref >59)
Globulin, Total: 3.4 g/dL (ref 1.5–4.5)
Glucose: 85 mg/dL (ref 65–99)
Potassium: 4.8 mmol/L (ref 3.5–5.2)
Sodium: 139 mmol/L (ref 134–144)
Total Protein: 7.1 g/dL (ref 6.0–8.5)

## 2014-11-01 LAB — TSH: TSH: 0.425 u[IU]/mL — ABNORMAL LOW (ref 0.450–4.500)

## 2014-11-01 NOTE — Addendum Note (Signed)
Addended byOtis Brace on: 11/01/2014 04:04 PM   Modules accepted: Orders

## 2014-11-02 ENCOUNTER — Encounter: Payer: Self-pay | Admitting: *Deleted

## 2014-11-02 ENCOUNTER — Encounter: Payer: Self-pay | Admitting: Obstetrics

## 2014-11-02 ENCOUNTER — Ambulatory Visit (INDEPENDENT_AMBULATORY_CARE_PROVIDER_SITE_OTHER): Payer: Medicaid Other | Admitting: Obstetrics

## 2014-11-02 ENCOUNTER — Emergency Department (HOSPITAL_COMMUNITY)
Admission: EM | Admit: 2014-11-02 | Discharge: 2014-11-02 | Disposition: A | Discharge status: Home / Self Care | Payer: Medicaid Other | Attending: Emergency Medicine | Admitting: Emergency Medicine

## 2014-11-02 ENCOUNTER — Encounter (HOSPITAL_COMMUNITY): Payer: Self-pay | Admitting: Emergency Medicine

## 2014-11-02 VITALS — BP 145/82 | HR 84 | Temp 98.2°F | Wt 174.0 lb

## 2014-11-02 DIAGNOSIS — K219 Gastro-esophageal reflux disease without esophagitis: Secondary | ICD-10-CM | POA: Insufficient documentation

## 2014-11-02 DIAGNOSIS — Z4800 Encounter for change or removal of nonsurgical wound dressing: Secondary | ICD-10-CM | POA: Diagnosis not present

## 2014-11-02 DIAGNOSIS — N750 Cyst of Bartholin's gland: Secondary | ICD-10-CM | POA: Diagnosis not present

## 2014-11-02 DIAGNOSIS — Z880 Allergy status to penicillin: Secondary | ICD-10-CM | POA: Insufficient documentation

## 2014-11-02 DIAGNOSIS — L7621 Postprocedural hemorrhage and hematoma of skin and subcutaneous tissue following a dermatologic procedure: Secondary | ICD-10-CM | POA: Insufficient documentation

## 2014-11-02 DIAGNOSIS — Z79899 Other long term (current) drug therapy: Secondary | ICD-10-CM | POA: Diagnosis not present

## 2014-11-02 DIAGNOSIS — L7682 Other postprocedural complications of skin and subcutaneous tissue: Secondary | ICD-10-CM

## 2014-11-02 DIAGNOSIS — Y838 Other surgical procedures as the cause of abnormal reaction of the patient, or of later complication, without mention of misadventure at the time of the procedure: Secondary | ICD-10-CM | POA: Insufficient documentation

## 2014-11-02 DIAGNOSIS — N898 Other specified noninflammatory disorders of vagina: Secondary | ICD-10-CM

## 2014-11-02 DIAGNOSIS — J45909 Unspecified asthma, uncomplicated: Secondary | ICD-10-CM | POA: Insufficient documentation

## 2014-11-02 DIAGNOSIS — I10 Essential (primary) hypertension: Secondary | ICD-10-CM | POA: Diagnosis not present

## 2014-11-02 DIAGNOSIS — N939 Abnormal uterine and vaginal bleeding, unspecified: Secondary | ICD-10-CM

## 2014-11-02 DIAGNOSIS — Z5189 Encounter for other specified aftercare: Secondary | ICD-10-CM

## 2014-11-02 DIAGNOSIS — Z8619 Personal history of other infectious and parasitic diseases: Secondary | ICD-10-CM | POA: Diagnosis not present

## 2014-11-02 LAB — POC URINE PREG, ED: Preg Test, Ur: NEGATIVE

## 2014-11-02 LAB — T4, FREE: Free T4: 1.51 ng/dL (ref 0.82–1.77)

## 2014-11-02 LAB — SPECIMEN STATUS REPORT

## 2014-11-02 MED ORDER — HYDROMORPHONE HCL 2 MG PO TABS: 2.0000 mg | ORAL_TABLET | ORAL | Status: DC | PRN

## 2014-11-02 MED ORDER — OXYCODONE-ACETAMINOPHEN 5-325 MG PO TABS: 2.0000 | ORAL_TABLET | Freq: Once | ORAL | Status: DC

## 2014-11-02 NOTE — Progress Notes (Signed)
Patient ID: Sue [name], female   DOB: [birth year], 39 y.o.   MRN: [id number]  Chief Complaint  Patient presents with  . Menstrual Problem    HPI Sue [name] is a 39 y.o. female.  Vaginal bleeding and pain in vaginal area.  Word catheter for Bartholin cyst is uncomfortable.   HPI  Past Medical History  Diagnosis Date  . Asthma     mild by PFT's 03/16/1998  . Sciatica 01/12/06  . History of abnormal Pap smear     s/p cryo( Dr. Wiliam Ke)  . Cervical lymphadenopathy     biopsy negativeMarvetta Gibbons)  . Normal vaginal delivery     x3  . Complete spontaneous abortion     x2  . Hypertension   . HSV (herpes simplex virus) anogenital infection   . GERD (gastroesophageal reflux disease)   . 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   . Vaginal Pap smear, abnormal     Past Surgical History  Procedure Laterality Date  . Biopsy of lymph node in r neck  2007.  Sue [name] Leep      Family History  Problem Relation Age of Onset  . Ulcerative colitis Mother   . Hypertension Mother   . Diabetes Father   . Heart attack Father   . Hypertension Father   . Diabetes Sister   . Heart attack Maternal Grandmother     Social History Social History  Substance Use Topics  . Smoking status: Never Smoker   . Smokeless tobacco: Never Used  . Alcohol Use: 0.0 oz/week    0 Standard drinks or equivalent per week     Comment: occasional beer and liquor on weekends    Allergies  Allergen Reactions  . Sulfamethoxazole-Trimethoprim Swelling    Swelling is of the lips.  . Amoxicillin Rash    Current Outpatient Prescriptions  Medication Sig Dispense Refill  . albuterol (PROVENTIL HFA) 108 (90 BASE) MCG/ACT inhaler Inhale 2 puffs into the lungs every 6 (six) hours as needed for wheezing. 1 Inhaler 6  . amLODipine (NORVASC) 5 MG tablet Take 1 tablet (5 mg  total) by mouth daily. 30 tablet 11  . cyclobenzaprine (FLEXERIL) 5 MG tablet Take 1 tablet (5 mg total) by mouth 3 (three) times daily as needed for muscle spasms. 30 tablet 0  . cyclopentolate (CYCLODRYL,CYCLOGYL) 1 % ophthalmic solution Place 1 drop into the left eye 2 (two) times a week.  0  . famotidine (PEPCID) 20 MG tablet Take 1 tablet (20 mg total) by mouth 2 (two) times daily. 60 tablet 0  . levonorgestrel-ethinyl estradiol (SEASONALE) 0.15-0.03 MG per tablet Take 1 tablet by mouth daily.      Sue [name] loratadine (CLARITIN) 10 MG tablet Take 1 tablet (10 mg total) by mouth daily. 30 tablet 0  . mesalamine (CANASA) 1000 MG suppository Place 1,000 mg rectally at bedtime.    . Multiple Vitamin (MULITIVITAMIN WITH MINERALS) TABS Take 1 tablet by mouth daily.    . Naproxen Sodium (ALEVE) 220 MG CAPS Take 1 capsule (220 mg total) by mouth 2 (two) times daily as needed (For headache.). 60 each 0  . valACYclovir (VALTREX) 500 MG tablet Take 1 tablet by mouth twice a day for 3 days. 30 tablet prn  . cetirizine (ZYRTEC) 10 MG tablet Take 1 tablet (10 mg total) by mouth daily. (Patient not  taking: Reported on 11/02/2014) 30 tablet 6  . HYDROmorphone (DILAUDID) 2 MG tablet Take 1 tablet (2 mg total) by mouth every 4 (four) hours as needed for severe pain. 40 tablet 0   No current facility-administered medications for this visit.    Review of Systems Review of Systems Constitutional: negative for fatigue and weight loss Respiratory: negative for cough and wheezing Cardiovascular: negative for chest pain, fatigue and palpitations Gastrointestinal: negative for abdominal pain and change in bowel habits Genitourinary:positive for vaginal bleeding Integument/breast: negative for nipple discharge Musculoskeletal:negative for myalgias Neurological: negative for gait problems and tremors Behavioral/Psych: negative for abusive relationship, depression Endocrine: negative for temperature intolerance      Blood pressure 145/82, pulse 84, temperature 98.2 F (36.8 C), weight 174 lb (78.926 kg), last menstrual period 10/05/2014.  Physical Exam Physical Exam            General:  Alert and no distress Abdomen:  normal findings: no organomegaly, soft, non-tender and no hernia  Pelvis:  External genitalia: normal general appearance.  Word catheter in place Urinary system: urethral meatus normal and bladder without fullness, nontender Vaginal: normal without tenderness, induration or masses Cervix: normal appearance Adnexa: normal bimanual exam Uterus: anteverted and non-tender, normal size      Data Reviewed Labs  Assessment     Bartholin Gland Cyst.  Bleeding from Word Catheter insertion.  Stable.     Plan    Continue post op care. F/U in 2 weeks.   Orders Placed This Encounter  Procedures  . SureSwab, Vaginosis/Vaginitis Plus   Meds ordered this encounter  Medications  . HYDROmorphone (DILAUDID) 2 MG tablet    Sig: Take 1 tablet (2 mg total) by mouth every 4 (four) hours as needed for severe pain.    Dispense:  40 tablet    Refill:  0

## 2014-11-02 NOTE — ED Notes (Signed)
Pt reports had a cyst drained from vaginal area on Wednesday with a ward cath placed. Pt c/o bleeding from cyst and pain in vaginal area. Pt also reports nausea and had some abdominal pain yesterday.

## 2014-11-02 NOTE — ED Provider Notes (Addendum)
CSN: [id number]     Arrival date & time 11/02/14  0800 History   First MD Initiated Contact with Patient 11/02/14 [phone]     Chief Complaint  Patient presents with  . Wound Check  . Nausea     (Consider location/radiation/quality/duration/timing/severity/associated sxs/prior Treatment) HPI Comments: Bartholin cyst drained on Wednesday by OBGYN This AM woke up with pain increased and bleeding, reports bleeding like a period Called OBGYN and instructed to come to ED Taking doxycycline and oxycodone as rx No fevers/chills  Patient is a 39 y.o. female presenting with wound check.  Wound Check This is a new problem. Episode onset: 3 days ago. The problem occurs constantly. The problem has not changed since onset.Pertinent negatives include no chest pain, no abdominal pain, no headaches and no shortness of breath. Nothing aggravates the symptoms. Nothing relieves the symptoms. Treatments tried: oxycodone. The treatment provided mild relief.    Past Medical History  Diagnosis Date  . Asthma     mild by PFT's 03/16/1998  . Sciatica 01/12/06  . History of abnormal Pap smear     s/p cryo( Dr. Wiliam Ke)  . Cervical lymphadenopathy     biopsy negativeMarvetta Gibbons)  . Normal vaginal delivery     x3  . Complete spontaneous abortion     x2  . Hypertension   . HSV (herpes simplex virus) anogenital infection   . GERD (gastroesophageal reflux disease)   . 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   . Vaginal Pap smear, abnormal    Past Surgical History  Procedure Laterality Date  . Biopsy of lymph node in r neck  2007.  Marland Kitchen Leep     Family History  Problem Relation Age of Onset  . Ulcerative colitis Mother   . Hypertension Mother   . Diabetes Father   . Heart attack Father   . Hypertension Father   . Diabetes Sister   . Heart attack Maternal  Grandmother    Social History  Substance Use Topics  . Smoking status: Never Smoker   . Smokeless tobacco: Never Used  . Alcohol Use: 0.0 oz/week    0 Standard drinks or equivalent per week     Comment: occasional beer and liquor on weekends   OB History    Gravida Para Term Preterm AB TAB SAB Ectopic Multiple Living   6 3 2 1 3  3   3      Review of Systems  Constitutional: Negative for fever.  HENT: Negative for congestion and sore throat.   Eyes: Negative for visual disturbance.  Respiratory: Negative for cough and shortness of breath.   Cardiovascular: Negative for chest pain.  Gastrointestinal: Negative for nausea, vomiting, abdominal pain, diarrhea and constipation.  Genitourinary: Positive for vaginal bleeding (bleeding from around ward catheter) and vaginal pain (around catheter). Negative for dysuria, frequency and difficulty urinating.  Musculoskeletal: Negative for back pain and neck pain.  Skin: Positive for wound. Negative for rash.  Neurological: Negative for syncope and headaches.      Allergies  Sulfamethoxazole-trimethoprim and Amoxicillin  Home Medications   Prior to Admission medications   Medication Sig Start Date End Date Taking? Authorizing Provider  albuterol (PROVENTIL HFA) 108 (90 BASE) MCG/ACT inhaler Inhale 2 puffs into the lungs every 6 (six) hours as needed for wheezing. 05/23/14 06/21/16  06/23/16, MD  amLODipine (NORVASC)  5 MG tablet Take 1 tablet (5 mg total) by mouth daily. 08/07/13   Brock Bad, MD  cetirizine (ZYRTEC) 10 MG tablet Take 1 tablet (10 mg total) by mouth daily. Patient taking differently: Take 10 mg by mouth daily as needed for allergies or rhinitis.  11/02/13   Denton Brick, MD  cyclobenzaprine (FLEXERIL) 5 MG tablet Take 1 tablet (5 mg total) by mouth 3 (three) times daily as needed for muscle spasms. 09/12/14   Otis Brace, MD  cyclopentolate (CYCLODRYL,CYCLOGYL) 1 % ophthalmic solution Place 1 drop into the left  eye 2 (two) times a week. 01/05/14   Historical Provider, MD  famotidine (PEPCID) 20 MG tablet Take 1 tablet (20 mg total) by mouth 2 (two) times daily. 05/23/14 05/23/15  Lorenda Hatchet, MD  levonorgestrel-ethinyl estradiol (SEASONALE) 0.15-0.03 MG per tablet Take 1 tablet by mouth daily.      Historical Provider, MD  loratadine (CLARITIN) 10 MG tablet Take 1 tablet (10 mg total) by mouth daily. 05/23/14   Lorenda Hatchet, MD  mesalamine (CANASA) 1000 MG suppository Place 1,000 mg rectally at bedtime.    Historical Provider, MD  Multiple Vitamin (MULITIVITAMIN WITH MINERALS) TABS Take 1 tablet by mouth daily.    Historical Provider, MD  Naproxen Sodium (ALEVE) 220 MG CAPS Take 1 capsule (220 mg total) by mouth 2 (two) times daily as needed (For headache.). 09/12/14   Otis Brace, MD  valACYclovir (VALTREX) 500 MG tablet Take 1 tablet by mouth twice a day for 3 days. 07/09/14   Brock Bad, MD   BP 140/81 mmHg  Pulse 84  Temp(Src) 98.8 F (37.1 C) (Oral)  Resp 20  Wt 173 lb 1 oz (78.5 kg)  SpO2 99%  LMP 10/05/2014 (Approximate) Physical Exam  Constitutional: She is oriented to person, place, and time. She appears well-developed and well-nourished. No distress.  HENT:  Head: Normocephalic and atraumatic.  Eyes: Conjunctivae and EOM are normal.  Neck: Normal range of motion.  Cardiovascular: Normal rate.   Pulmonary/Chest: Effort normal. No respiratory distress.  Abdominal: Soft. She exhibits no distension. There is no tenderness.  Genitourinary: There is tenderness (around ward catheter) on the left labia. No bleeding in the vagina.  Ward catheter in place approx 1cm incision, no other tears, evidence of previous small amt of blood at edge No hematoma/no swelling/no fluctuance/no erythema/no prulence   Musculoskeletal: She exhibits no edema or tenderness.  Neurological: She is alert and oriented to person, place, and time.  Skin: Skin is warm and dry. No rash noted. She is not  diaphoretic. No erythema.  Nursing note and vitals reviewed.   ED Course  Procedures (including critical care time) Labs Review Labs Reviewed - No data to display  Imaging Review No results found. I have personally reviewed and evaluated these images and lab results as part of my medical decision-making.   EKG Interpretation None      MDM   Final diagnoses:  None    39 year old female with history of htn, asthma presents with concern for bleeding from site of ward catheter this AM after having ward catheter placed for Bartholin's cyst drainage on Wednesday.  Area examined and shown to have no sign of hematoma, no swelling, no erythema, no fluctuance, with ward catheter seated properly. There is no significant bleeding from the site of my examination.  Patient has not on blood thinners. Expect this to be typical post catheter placement bleeding from local irritation at  the site.  Patient is well-appearing, hemodynamically stable. A urine pregnancy test was done which was negative.  Discussed other symptoms to watch for and need for [facility] physician follow-up. Recommended continued pain control as discussed with her OB physician. Patient discharged in stable condition with understanding of reasons to return.    Alvira Monday, MD 11/03/14 [phone]

## 2014-11-02 NOTE — Discharge Instructions (Signed)
Bartholin's Cyst or Abscess °Bartholin's glands are small glands located within the folds of skin (labia) along the sides of the lower opening of the vagina (birth canal). A cyst may develop when the duct of the gland becomes blocked. When this happens, fluid that accumulates within the cyst can become infected. This is known as an abscess. The Bartholin gland produces a mucous fluid to lubricate the outside of the vagina during sexual intercourse. °SYMPTOMS  °· Patients with a small cyst may not have any symptoms. °· Mild discomfort to severe pain depending on the size of the cyst and if it is infected (abscess). °· Pain, redness, and swelling around the lower opening of the vagina. °· Painful intercourse. °· Pressure in the perineal area. °· Swelling of the lips of the vagina (labia). °· The cyst or abscess can be on one side or both sides of the vagina. °DIAGNOSIS  °· A large swelling is seen in the lower vagina area by your caregiver. °· Painful to touch. °· Redness and pain, if it is an abscess. °TREATMENT  °· Sometimes the cyst will go away on its own. °· Apply warm wet compresses to the area or take hot sitz baths several times a day. °· An incision to drain the cyst or abscess with local anesthesia. °· Culture the pus, if it is an abscess. °· Antibiotic treatment, if it is an abscess. °· Cut open the gland and suture the edges to make the opening of the gland bigger (marsupialization). °· Remove the whole gland if the cyst or abscess returns. °PREVENTION  °· Practice good hygiene. °· Clean the vaginal area with a mild soap and soft cloth when bathing. °· Do not rub hard in the vaginal area when bathing. °· Protect the crotch area with a padded cushion if you take long bike rides or ride horses. °· Be sure you are well lubricated when you have sexual intercourse. °HOME CARE INSTRUCTIONS  °· If your cyst or abscess was opened, a small piece of gauze, or a drain, may have been placed in the wound to allow  drainage. Do not remove this gauze or drain unless directed by your caregiver. °· Wear feminine pads, not tampons, as needed for any drainage or bleeding. °· If antibiotics were prescribed, take them exactly as directed. Finish the entire course. °· Only take over-the-counter or prescription medicines for pain, discomfort, or fever as directed by your caregiver. °SEEK IMMEDIATE MEDICAL CARE IF:  °· You have an increase in pain, redness, swelling, or drainage. °· You have bleeding from the wound which results in the use of more than the number of pads suggested by your caregiver in 24 hours. °· You have chills. °· You have a fever. °· You develop any new problems (symptoms) or aggravation of your existing condition. °MAKE SURE YOU:  °· Understand these instructions. °· Will watch your condition. °· Will get help right away if you are not doing well or get worse. °Document Released: 02/23/2005 Document Revised: 05/18/2011 Document Reviewed: 10/12/2007 °[employer]® Patient Information ©2015 [employer]. This information is not intended to replace advice given to you by your health care provider. Make sure you discuss any questions you have with your health care provider. ° °

## 2014-11-05 NOTE — Progress Notes (Signed)
[facility] Clinic Attending  Case discussed with Dr. Rabbani soon after the resident saw the patient.  We reviewed the resident's history and exam and pertinent patient test results.  I agree with the assessment, diagnosis, and plan of care documented in the resident's note.  

## 2014-11-07 LAB — SURESWAB, VAGINOSIS/VAGINITIS PLUS
Atopobium vaginae: NOT DETECTED Log (cells/mL)
C. albicans, DNA: NOT DETECTED
C. glabrata, DNA: NOT DETECTED
C. parapsilosis, DNA: NOT DETECTED
C. trachomatis RNA, TMA: NOT DETECTED
C. tropicalis, DNA: NOT DETECTED
Gardnerella vaginalis: NOT DETECTED Log (cells/mL)
LACTOBACILLUS SPECIES: 7.5 Log (cells/mL)
MEGASPHAERA SPECIES: NOT DETECTED Log (cells/mL)
N. gonorrhoeae RNA, TMA: NOT DETECTED
T. vaginalis RNA, QL TMA: NOT DETECTED

## 2014-11-13 ENCOUNTER — Ambulatory Visit (INDEPENDENT_AMBULATORY_CARE_PROVIDER_SITE_OTHER): Payer: Medicaid Other | Admitting: Obstetrics

## 2014-11-13 VITALS — BP 128/83 | HR 78 | Temp 99.1°F | Wt 172.0 lb

## 2014-11-13 DIAGNOSIS — N750 Cyst of Bartholin's gland: Secondary | ICD-10-CM

## 2014-11-14 ENCOUNTER — Ambulatory Visit: Payer: Medicaid Other | Admitting: Obstetrics

## 2014-11-14 NOTE — Addendum Note (Signed)
Addended byOtis Brace on: 11/14/2014 03:43 PM   Modules accepted: Orders

## 2014-11-15 ENCOUNTER — Encounter: Payer: Self-pay | Admitting: Obstetrics

## 2014-11-15 NOTE — Progress Notes (Signed)
Patient ID: Sue [name], female   DOB: 14-Mar-1975, 39 y.o.   MRN: [id number]  Chief Complaint  Patient presents with  . Follow-up    bartholin cyst drain    HPI Sue [name] is a 39 y.o. female.  Presents for 2 week follow up after I&D and Word Catheter placement for Bartholin Cyst.  The Word Catheter stayed in for 2 weeks before spontaneously falling out.  She has completed her 2 week course of antibiotics.  No complaints today.  No drainage or pain from Bartholin Gland. HPI  Past Medical History  Diagnosis Date  . Asthma     mild by PFT's 03/16/1998  . Sciatica 01/12/06  . History of abnormal Pap smear     s/p cryo( Dr. Wiliam Ke)  . Cervical lymphadenopathy     biopsy negativeMarvetta Gibbons)  . Normal vaginal delivery     x3  . Complete spontaneous abortion     x2  . Hypertension   . HSV (herpes simplex virus) anogenital infection   . GERD (gastroesophageal reflux disease)   . 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   . Vaginal Pap smear, abnormal     Past Surgical History  Procedure Laterality Date  . Biopsy of lymph node in r neck  2007.  Sue [name]      Family History  Problem Relation Age of Onset  . Ulcerative colitis Mother   . Hypertension Mother   . Diabetes Father   . Heart attack Father   . Hypertension Father   . Diabetes Sister   . Heart attack Maternal Grandmother     Social History Social History  Substance Use Topics  . Smoking status: Never Smoker   . Smokeless tobacco: Never Used  . Alcohol Use: 0.0 oz/week    0 Standard drinks or equivalent per week     Comment: occasional beer and liquor on weekends    Allergies  Allergen Reactions  . Sulfamethoxazole-Trimethoprim Swelling    Swelling is of the lips.  . Amoxicillin Rash    Current Outpatient Prescriptions  Medication Sig Dispense Refill  .  albuterol (PROVENTIL HFA) 108 (90 BASE) MCG/ACT inhaler Inhale 2 puffs into the lungs every 6 (six) hours as needed for wheezing. 1 Inhaler 6  . amLODipine (NORVASC) 5 MG tablet Take 1 tablet (5 mg total) by mouth daily. 30 tablet 11  . cetirizine (ZYRTEC) 10 MG tablet Take 1 tablet (10 mg total) by mouth daily. (Patient not taking: Reported on 11/02/2014) 30 tablet 6  . cyclobenzaprine (FLEXERIL) 5 MG tablet Take 1 tablet (5 mg total) by mouth 3 (three) times daily as needed for muscle spasms. 30 tablet 0  . cyclopentolate (CYCLODRYL,CYCLOGYL) 1 % ophthalmic solution Place 1 drop into the left eye 2 (two) times a week.  0  . famotidine (PEPCID) 20 MG tablet Take 1 tablet (20 mg total) by mouth 2 (two) times daily. 60 tablet 0  . HYDROmorphone (DILAUDID) 2 MG tablet Take 1 tablet (2 mg total) by mouth every 4 (four) hours as needed for severe pain. 40 tablet 0  . levonorgestrel-ethinyl estradiol (SEASONALE) 0.15-0.03 MG per tablet Take 1 tablet by mouth daily.      Sue Kitchen loratadine (CLARITIN) 10 MG tablet Take 1 tablet (10 mg total) by mouth daily. 30 tablet 0  . mesalamine (CANASA) 1000 MG suppository  Place 1,000 mg rectally at bedtime.    . Multiple Vitamin (MULITIVITAMIN WITH MINERALS) TABS Take 1 tablet by mouth daily.    . Naproxen Sodium (ALEVE) 220 MG CAPS Take 1 capsule (220 mg total) by mouth 2 (two) times daily as needed (For headache.). 60 each 0  . valACYclovir (VALTREX) 500 MG tablet Take 1 tablet by mouth twice a day for 3 days. 30 tablet prn   No current facility-administered medications for this visit.    Review of Systems Review of Systems Constitutional: negative for fatigue and weight loss Respiratory: negative for cough and wheezing Cardiovascular: negative for chest pain, fatigue and palpitations Gastrointestinal: negative for abdominal pain and change in bowel habits Genitourinary: Bartholin Gland Cyst - Left Integument/breast: negative for nipple  discharge Musculoskeletal:negative for myalgias Neurological: negative for gait problems and tremors Behavioral/Psych: negative for abusive relationship, depression Endocrine: negative for temperature intolerance     Blood pressure 128/83, pulse 78, temperature 99.1 F (37.3 C), weight 172 lb (78.019 kg), last menstrual period 10/05/2014.  Physical Exam Physical Exam:           General:  Alert and no distress Abdomen:  normal findings: no organomegaly, soft, non-tender and no hernia  Pelvis:  External genitalia: normal general appearance.  Word Catheter insertion site clean and non tender, no erythema or induration Urinary system: urethral meatus normal and bladder without fullness, nontender Vaginal: normal without tenderness, induration or masses Cervix: normal appearance Adnexa: normal bimanual exam Uterus: anteverted and non-tender, normal size      Data Reviewed Labs  Assessment     Left Bartholin Gland Cyst.  S/P Word Catheter placement.  Doing well.     Plan    F/U 6 weeks.   No orders of the defined types were placed in this encounter.   No orders of the defined types were placed in this encounter.

## 2014-12-27 ENCOUNTER — Ambulatory Visit: Payer: Self-pay | Admitting: Obstetrics

## 2015-01-03 ENCOUNTER — Other Ambulatory Visit: Payer: Self-pay | Admitting: *Deleted

## 2015-01-03 MED ORDER — LEVONORGEST-ETH ESTRAD 91-DAY 0.15-0.03 MG PO TABS: 1.0000 | ORAL_TABLET | Freq: Every day | ORAL | Status: DC

## 2015-01-26 ENCOUNTER — Emergency Department (HOSPITAL_COMMUNITY)
Admission: EM | Admit: 2015-01-26 | Discharge: 2015-01-26 | Disposition: A | Discharge status: Home / Self Care | Payer: Self-pay | Attending: Emergency Medicine | Admitting: Emergency Medicine

## 2015-01-26 ENCOUNTER — Emergency Department (EMERGENCY_DEPARTMENT_HOSPITAL): Payer: Self-pay

## 2015-01-26 ENCOUNTER — Encounter (HOSPITAL_COMMUNITY): Payer: Self-pay | Admitting: *Deleted

## 2015-01-26 DIAGNOSIS — Z880 Allergy status to penicillin: Secondary | ICD-10-CM | POA: Insufficient documentation

## 2015-01-26 DIAGNOSIS — M79609 Pain in unspecified limb: Secondary | ICD-10-CM

## 2015-01-26 DIAGNOSIS — Z79899 Other long term (current) drug therapy: Secondary | ICD-10-CM | POA: Insufficient documentation

## 2015-01-26 DIAGNOSIS — Z3202 Encounter for pregnancy test, result negative: Secondary | ICD-10-CM | POA: Insufficient documentation

## 2015-01-26 DIAGNOSIS — M7918 Myalgia, other site: Secondary | ICD-10-CM

## 2015-01-26 DIAGNOSIS — J45909 Unspecified asthma, uncomplicated: Secondary | ICD-10-CM | POA: Insufficient documentation

## 2015-01-26 DIAGNOSIS — M79651 Pain in right thigh: Secondary | ICD-10-CM | POA: Insufficient documentation

## 2015-01-26 DIAGNOSIS — I10 Essential (primary) hypertension: Secondary | ICD-10-CM | POA: Insufficient documentation

## 2015-01-26 DIAGNOSIS — Z8619 Personal history of other infectious and parasitic diseases: Secondary | ICD-10-CM | POA: Insufficient documentation

## 2015-01-26 DIAGNOSIS — M5430 Sciatica, unspecified side: Secondary | ICD-10-CM | POA: Insufficient documentation

## 2015-01-26 DIAGNOSIS — K219 Gastro-esophageal reflux disease without esophagitis: Secondary | ICD-10-CM | POA: Insufficient documentation

## 2015-01-26 DIAGNOSIS — Z79818 Long term (current) use of other agents affecting estrogen receptors and estrogen levels: Secondary | ICD-10-CM | POA: Insufficient documentation

## 2015-01-26 LAB — POC URINE PREG, ED: Preg Test, Ur: NEGATIVE

## 2015-01-26 MED ORDER — KETOROLAC TROMETHAMINE 60 MG/2ML IM SOLN
30.0000 mg | Freq: Once | INTRAMUSCULAR | Status: AC
  Administered 2015-01-26: 30 mg via INTRAMUSCULAR
  Filled 2015-01-26: qty 2

## 2015-01-26 MED ORDER — METHOCARBAMOL 500 MG PO TABS: 500.0000 mg | ORAL_TABLET | Freq: Three times a day (TID) | ORAL | Status: DC | PRN

## 2015-01-26 NOTE — ED Notes (Signed)
PT reports while putting on her shoe on Tuesday she stepped down to hard . Pt reports the RT thigh pain started on Thursday.

## 2015-01-26 NOTE — ED Provider Notes (Signed)
CSN: [id number]     Arrival date & time 01/26/15  0751 History   First MD Initiated Contact with Patient 01/26/15 0813     Chief Complaint  Patient presents with  . Leg Pain     (Consider location/radiation/quality/duration/timing/severity/associated sxs/prior Treatment) HPI   Blood pressure 131/72, pulse 89, temperature 98.1 F (36.7 C), temperature source Oral, resp. rate 20, height  (1.6 m), weight 170 lb (77.111 kg), last menstrual period 12/26/2014, SpO2 99 %.  Sue [name] is a 39 y.o. female complaining of severe, aching and cramping right inner thigh pain worsening over the course of several days. Patient reports no trauma however she notes that when she was putting on a high boot 7 days ago she feels like she stepped down to hard. She's been taking Tylenol and Flexeril at home with little relief. She denies weakness, numbness, swelling, chest pain, cough, shortness of breath. On oral contraceptives, no recent trips, surgeries.   Past Medical History  Diagnosis Date  . Asthma     mild by PFT's 03/16/1998  . Sciatica 01/12/06  . History of abnormal Pap smear     s/p cryo( Dr. Wiliam Ke)  . Cervical lymphadenopathy     biopsy negativeMarvetta Gibbons)  . Normal vaginal delivery     x3  . Complete spontaneous abortion     x2  . Hypertension   . HSV (herpes simplex virus) anogenital infection   . GERD (gastroesophageal reflux disease)   . 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   . Vaginal Pap smear, abnormal    Past Surgical History  Procedure Laterality Date  . Biopsy of lymph node in r neck  2007.  Marland Kitchen Leep     Family History  Problem Relation Age of Onset  . Ulcerative colitis Mother   . Hypertension Mother   . Diabetes Father   . Heart attack Father   . Hypertension Father   . Diabetes Sister   . Heart attack  Maternal Grandmother    Social History  Substance Use Topics  . Smoking status: Never Smoker   . Smokeless tobacco: Never Used  . Alcohol Use: 0.0 oz/week    0 Standard drinks or equivalent per week     Comment: occasional beer and liquor on weekends   OB History    Gravida Para Term Preterm AB TAB SAB Ectopic Multiple Living   Review of Systems  10 systems reviewed and found to be negative, except as noted in the HPI.   Allergies  Sulfamethoxazole-trimethoprim and Amoxicillin  Home Medications   Prior to Admission medications   Medication Sig Start Date End Date Taking? Authorizing Provider  albuterol (PROVENTIL HFA) 108 (90 BASE) MCG/ACT inhaler Inhale 2 puffs into the lungs every 6 (six) hours as needed for wheezing. 05/23/14 06/21/16  Lorenda Hatchet, MD  amLODipine (NORVASC) 5 MG tablet Take 1 tablet (5 mg total) by mouth daily. 08/07/13   Brock Bad, MD  cetirizine (ZYRTEC) 10 MG tablet Take 1 tablet (10 mg total) by mouth daily. Patient not taking: Reported on 11/02/2014 11/02/13   Denton Brick, MD  cyclopentolate (CYCLODRYL,CYCLOGYL) 1 % ophthalmic solution Place 1 drop into the left eye 2 (two) times a week. 01/05/14   Historical Provider, MD  famotidine (PEPCID) 20 MG tablet Take 1 tablet (20 mg total) by mouth 2 (two) times daily. 05/23/14 05/23/15  Lorenda Hatchet, MD  HYDROmorphone (DILAUDID) 2 MG tablet Take 1 tablet (2 mg total) by mouth every 4 (four) hours as needed for severe pain. 11/02/14   Brock Bad, MD  levonorgestrel-ethinyl estradiol (SEASONALE,INTROVALE,JOLESSA) 0.15-0.03 MG tablet Take 1 tablet by mouth daily. 01/03/15   Brock Bad, MD  loratadine (CLARITIN) 10 MG tablet Take 1 tablet (10 mg total) by mouth daily. 05/23/14   Lorenda Hatchet, MD  mesalamine (CANASA) 1000 MG suppository Place 1,000 mg rectally at bedtime.    Historical Provider, MD  methocarbamol (ROBAXIN) 500 MG tablet Take 1 tablet (500 mg total) by mouth  every 8 (eight) hours as needed for muscle spasms. 01/26/15   Robin Petrakis, PA-C  Multiple Vitamin (MULITIVITAMIN WITH MINERALS) TABS Take 1 tablet by mouth daily.    Historical Provider, MD  Naproxen Sodium (ALEVE) 220 MG CAPS Take 1 capsule (220 mg total) by mouth 2 (two) times daily as needed (For headache.). 09/12/14   Otis Brace, MD  valACYclovir (VALTREX) 500 MG tablet Take 1 tablet by mouth twice a day for 3 days. 07/09/14   Brock Bad, MD   BP 122/81 mmHg  Pulse 83  Temp(Src) 98.6 F (37 C) (Oral)  Resp 16  Ht 5\' 3"  (1.6 m)  Wt 170 lb (77.111 kg)  BMI 30.12 kg/m2  SpO2 98%  LMP 12/26/2014 Physical Exam  Constitutional: She is oriented to person, place, and time. She appears well-developed and well-nourished. No distress.  HENT:  Head: Normocephalic and atraumatic.  Mouth/Throat: Oropharynx is clear and moist.  Eyes: Conjunctivae and EOM are normal. Pupils are equal, round, and reactive to light.  Neck: Normal range of motion.  Cardiovascular: Normal rate, regular rhythm and intact distal pulses.   Pulmonary/Chest: Effort normal and breath sounds normal. No stridor. No respiratory distress. She has no wheezes. She has no rales. She exhibits no tenderness.  Abdominal: Soft. Bowel sounds are normal. She exhibits no distension and no mass. There is no tenderness. There is no rebound and no guarding.  Musculoskeletal: Normal range of motion. She exhibits tenderness. She exhibits no edema.       Legs: No calf asymmetry, superficial collaterals, palpable cords, edema, Homans sign negative positive on the right   Right DP and PT pulses 2+, she is distally neurovascularly intact with good range of motion to knee and ankle and hip.  Neurological: She is alert and oriented to person, place, and time.  Psychiatric: She has a normal mood and affect.  Nursing note and vitals reviewed.   ED Course  Procedures (including critical care time) Labs Review Labs Reviewed  POC  URINE PREG, ED    Imaging Review No results found. I have personally reviewed and evaluated these images and lab results as part of my medical decision-making.   EKG Interpretation None      MDM   Final diagnoses:  Musculoskeletal pain    Filed Vitals:   01/26/15 0756 01/26/15 0757 01/26/15 0805 01/26/15 1120  BP: 131/72   122/81  Pulse: 89   83  Temp: 98.1 F (36.7 C)   98.6 F (37 C)  TempSrc: Oral   Oral  Resp: 20   16  Height:   5\' 3"  (1.6 m)   Weight:   170 lb (77.111 kg)   SpO2: 20% 99%  98%    Medications  ketorolac (TORADOL) injection 30 mg (30 mg Intramuscular Given 01/26/15 0946)    Sue [name] is 39 y.o. female presenting with severe atraumatic right thigh pain. No swelling, diffusely tender to palpation as diagrammed. She is distally neurovascularly intact with adequate range of motion. There is no firmness to the compartment, no overlying skin changes. Vascular ultrasound negative for DVT. Likely muscle. Patient offered crutches, she has them at home. Will change her from Flexeril to Robaxin.  Evaluation does not show pathology that would require ongoing emergent intervention or inpatient treatment. Pt is hemodynamically stable and mentating appropriately. Discussed findings and plan with patient/guardian, who agrees with care plan. All questions answered. Return precautions discussed and outpatient follow up given.   Discharge Medication List as of 01/26/2015 10:57 AM    START taking these medications   Details  methocarbamol (ROBAXIN) 500 MG tablet Take 1 tablet (500 mg total) by mouth every 8 (eight) hours as needed for muscle spasms., Starting 01/26/2015, Until Discontinued, Print             Wynetta Emery, PA-C 01/26/15 1224  Vanetta Mulders, MD 01/27/15 [phone]

## 2015-01-26 NOTE — Progress Notes (Signed)
VASCULAR LAB PRELIMINARY  PRELIMINARY  PRELIMINARY  PRELIMINARY  Right lower extremity venous duplex completed.    Preliminary report:  There is no DVT or SVT noted in the right lower extremity.   Jethro Radke, RVT 01/26/2015, 10:44 AM

## 2015-01-26 NOTE — ED Notes (Signed)
PT refused crutches because she has crutches at home.

## 2015-01-26 NOTE — Discharge Instructions (Signed)
Try Aspercreme 4% Lidocaine Odor Free Pain Relief Patch which is available over-the-counter at any drugstore   For pain control you may take up to  of Motrin (also known as ibuprofen). That is usually 4 over the counter pills,  3 times a day. Take with food to minimize stomach irritation   You can also take  tylenol (acetaminophen)  (this is 3 over the counter pills) four times a day. Do not drink alcohol or combine with other medications that have acetaminophen as an ingredient (Read the labels!).    For breakthrough pain you may take Robaxin. Do not drink alcohol, drive or operate heavy machinery when taking Robaxin.  Do not hesitate to return to the emergency room for any new, worsening or concerning symptoms.  Please obtain primary care using resource guide below. Let them know that you were seen in the emergency room and that they will need to obtain records for further outpatient management.   Musculoskeletal Pain Musculoskeletal pain is muscle and boney aches and pains. These pains can occur in any part of the body. Your caregiver may treat you without knowing the cause of the pain. They may treat you if blood or urine tests, X-rays, and other tests were normal.  CAUSES There is often not a definite cause or reason for these pains. These pains may be caused by a type of germ (virus). The discomfort may also come from overuse. Overuse includes working out too hard when your body is not fit. Boney aches also come from weather changes. Bone is sensitive to atmospheric pressure changes. HOME CARE INSTRUCTIONS   Ask when your test results will be ready. Make sure you get your test results.  Only take over-the-counter or prescription medicines for pain, discomfort, or fever as directed by your caregiver. If you were given medications for your condition, do not drive, operate machinery or power tools, or sign legal documents for 24 hours. Do not drink alcohol. Do not take sleeping  pills or other medications that may interfere with treatment.  Continue all activities unless the activities cause more pain. When the pain lessens, slowly resume normal activities. Gradually increase the intensity and duration of the activities or exercise.  During periods of severe pain, bed rest may be helpful. Lay or sit in any position that is comfortable.  Putting ice on the injured area.  Put ice in a bag.  Place a towel between your skin and the bag.  Leave the ice on for 15 to 20 minutes, 3 to 4 times a day.  Follow up with your caregiver for continued problems and no reason can be found for the pain. If the pain becomes worse or does not go away, it may be necessary to repeat tests or do additional testing. Your caregiver may need to look further for a possible cause. SEEK IMMEDIATE MEDICAL CARE IF:  You have pain that is getting worse and is not relieved by medications.  You develop chest pain that is associated with shortness or breath, sweating, feeling sick to your stomach (nauseous), or throw up (vomit).  Your pain becomes localized to the abdomen.  You develop any new symptoms that seem different or that concern you. MAKE SURE YOU:   Understand these instructions.  Will watch your condition.  Will get help right away if you are not doing well or get worse.   This information is not intended to replace advice given to you by your health care provider. Make sure you discuss  any questions you have with your health care provider.   Document Released: 02/23/2005 Document Revised: 05/18/2011 Document Reviewed: 10/28/2012 Elsevier Interactive Patient Education 2016 [employer].   Emergency Department Resource Guide 1) Find a Doctor and Pay Out of Pocket Although you won't have to find out who is covered by your insurance plan, it is a good idea to ask around and get recommendations. You will then need to call the office and see if the doctor you have chosen will  accept you as a new patient and what types of options they offer for patients who are self-pay. Some doctors offer discounts or will set up payment plans for their patients who do not have insurance, but you will need to ask so you aren't surprised when you get to your appointment.  2) Contact Your Local Health Department Not all health departments have doctors that can see patients for sick visits, but many do, so it is worth a call to see if yours does. If you don't know where your local health department is, you can check in your phone book. The CDC also has a tool to help you locate your state's health department, and many state websites also have listings of all of their local health departments.  3) Find a Walk-in Clinic If your illness is not likely to be very severe or complicated, you may want to try a walk in clinic. These are popping up all over the country in pharmacies, drugstores, and shopping centers. They're usually staffed by nurse practitioners or physician assistants that have been trained to treat common illnesses and complaints. They're usually fairly quick and inexpensive. However, if you have serious medical issues or chronic medical problems, these are probably not your best option.  No Primary Care Doctor: - Call Health Connect at  [phone] - they can help you locate a primary care doctor that  accepts your insurance, provides certain services, etc. - Physician Referral Service- [phone]  Chronic Pain Problems: Organization         Address  Phone   Notes  [facility]  [phone] Patients need to be referred by their primary care doctor.   Medication Assistance: Organization         Address  Phone   Notes  [facility] Medication [facility] [address] [place] [place], [place] [address] [phone] --Must be a resident of [facility] -- Must have NO insurance coverage whatsoever (no Medicaid/ Medicare, etc.) -- The pt.  MUST have a primary care doctor that directs their care regularly and follows them in the community   [facility]  [phone]   [employer]  [phone]    Agencies that provide inexpensive medical care: Organization         Address  Phone   Notes  [facility]  [phone]   [facility]    [phone]   [facility] [address] [place], [place] [address] [phone]   [facility] [address], [place] [phone]   [facility]anned Parenthood    [phone]   [facility]    [phone]   [facility]  [address], [place] Phone:  [phone], Fax:  [phone] Hours of Operation:  9 am - 6 pm, M-F.  Also accepts Medicaid/Medicare and self-pay.  [facility]  [address] [place],  [address], [place] Phone: [phone], Fax: [phone]. Hours of Operation:  8:30 am - 5:30 pm, M-F.  Also accepts Medicaid and self-pay.  [facility] [place] [address] [place] Phone: [phone]   [facility] [address] Natasha Bence [place], [place] [phone], Ext. [phone] Mondays & Thursdays: 7-9 AM.  First 15 patients are seen on a first come, first serve basis.    Medicaid-accepting [facility] Providers:  Organization         Address  Phone   Notes  [facility] [address], [place] [phone] Also accepts self-pay patients.  [facility] [address] Laurell Josephs [place], [place]  [phone]   [facility] [address], [place] [phone]   [facility] [address] [place] [phone]   Renaye Rakers [address], [place]   [phone] Only accepts [place] Access [place] patients after they have their name applied to their card.   Self-Pay (no insurance) in  [facility]:  Organization         Address  Phone   Notes  Sickle Cell Patients, [facility] [address] [place], [place] [phone]   [facility] [address] [place], [place] [phone]   [facility] [place]  [address], [place] [phone]   [facility]/Dr. Osei-Bonsu  [address], [place] or [address], [place] [phone] Phone number for both [place] and [place] locations is the same.  [facility] [address], [place] [phone]   [facility] [address] [place] or [address] [phone]   [facility] [address], [place] [phone], phone; [phone], fax Sees patients 1st and 3rd Saturday of every month.  Must not qualify for public or private insurance (i.e. Medicaid, Medicare, [place] Health Choice, Veterans' Benefits)  Household income should be no more than 200% of the poverty level The clinic cannot treat you if you are pregnant or think you are pregnant  Sexually transmitted diseases are not treated at the clinic.    Dental Care: Organization         Address  Phone  Notes  [facility] Department of [facility] [address] [place], [place] [phone] Accepts children up to age 63 who are enrolled in [place] or [place] Health Choice; pregnant women with a Medicaid card; and children who have applied for Medicaid or [place] Health Choice, but were declined, whose parents can pay a reduced fee at time of service.  [facility]  [address], [place] [phone] Accepts children up to age 17 who are enrolled in [place] or [place] Health Choice; pregnant women with a Medicaid card; and children who have applied for Medicaid or [place] Health Choice, but were declined, whose  parents can pay a reduced fee at time of service.  [facility]  [address] [place], [place] [phone] Patients are seen by appointment only. Walk-ins are not accepted. [facility] will see patients 66 years of age and older. Monday - Tuesday (8am-5pm) Most Wednesdays (8:30-5pm) $30 per visit, cash only  [employer] [facility] [employer]  [address], [place] [phone] Patients are seen by appointment only. Walk-ins are not accepted. [facility] will see patients 6 years of age and older. One Wednesday Evening (Monthly: Volunteer Based).  $30 per visit, cash only  [employer]  [phone] for adults; Children under age 61, call Graduate Pediatric Dentistry at [phone]. Children aged 40-14, please call [phone] to request a pediatric application.  Dental services are provided in all areas of dental care including fillings, crowns and bridges, complete and partial dentures, implants, gum treatment, root canals, and extractions. Preventive care is also provided. Treatment is provided to both adults and children. Patients are selected via a lottery and there is often a waiting list.   [facility] [address], [place]  [phone] [email or web address]   [facility] [address] [place], [place] [phone], Ext. [phone] Second and Fourth Thursday of each month, opens at 6:30 AM; Clinic ends at 9 AM.  Patients are seen on a first-come first-served basis, and a limited number are seen during each clinic.   [facility]  [address] Ether Griffins [place], [place] [phone]   Eligibility Requirements You must have lived in [place], [place], or [place] counties for at least the last three months.   You cannot be eligible for state or federal sponsored [place], including [employer], [place], or [employer].   You generally cannot be eligible for  healthcare insurance through your employer.    How to apply: Eligibility screenings are held every Tuesday and Wednesday afternoon from 1:00 pm until 4:00 pm. You do not need an appointment for the interview!  [facility] [address], [place], [place] [phone]   [facility]  [phone]   [facility] Department  [phone]   [facility] Health Department  [phone]    Behavioral Health Resources in the Community: Intensive Outpatient Programs Organization         Address  Phone  Notes  [facility] [address] [place], [place] [phone]   [facility] Outpatient [address] [place], [place] [phone]   [facility] [address], [place], [place]  [phone]   [facility] Mental Health [address],  [place], [place] [phone] or [phone]   Substance Abuse Resources Organization         Address  Phone  Notes  [facility] Drug Services  [phone]   [facility]  [phone]   [facility] [place]  [phone]   Floydene Flock  [phone]   Residential & Outpatient Substance Abuse Program  [phone]   Psychological Services Organization         Address  Phone  Notes  [facility] Behavioral Health  [phone]   [facility] Services  [phone]   [facility] Mental Health [address] [place] [phone] or [phone]    Mobile Crisis Teams Organization         Address  Phone  Notes  [facility], Mobile Crisis Care Unit  [phone]   [facility]  [address] [place], [place] [phone]   Doristine Locks [address] [place] [phone]    Self-Help/Support Groups Organization         Address  Phone             Notes  [facility] [place] -  variety of support groups  [phone] Call for more information  Narcotics  Anonymous (NA), Caring Services [address], [employer] [place]  2 meetings at this location   Residential Sports administrator         Address  Phone  Notes  [facility] [address],    [place]  [phone]   [facility]  [address], [place] [address], [place], [place] [phone]   [facility] [address] [place], [place] [phone] Admissions: 8am-3pm M-F  [facility] [address],    [place], [place] [phone]   [facility] [address] [place], [place], [place] [phone]   [facility] [address],  [place], [place] [phone]   [facility] - Intensive Outpatient [address] Laurell Joseph[address], [place], [place] [phone]   [facility] (Addiction Recovery [facility] [address] [place]  [place], [place] [phone] or [phone]   [facility] ([facility]) [address], [place], [place] [phone] Accepts Medicaid  [facility] [address],  [place] [phone] Substance Abuse/Addiction Treatment   [facility] Organization         Address  Phone  Notes  [facility]  [phone]   Angie Fava, PhD [address] Ervin Kn[place], [place]   [phone] or [phone]   [facility] Behavioral   [address] [place], [place] [phone]   [facility] [address] [place], [place] [phone] Insurance/Medicaid/sponsorship through [facility] and Families [address]                                    [place], [place] [phone] Therapy/tele-psych/case  [facility] [address], [place] [phone]    Dr. Lolly Mustache  [phone]   [facility] [place]  [employer] [facility] Dept. 1) [address], [place] 2) [address] [place] 3)  [address], [place] [phone]  [phone]   [facility] Child Abuse Hotline [phone] 061-0231 or [phone] (After Hours)

## 2015-03-24 ENCOUNTER — Emergency Department (INDEPENDENT_AMBULATORY_CARE_PROVIDER_SITE_OTHER)
Admission: EM | Admit: 2015-03-24 | Discharge: 2015-03-24 | Disposition: A | Discharge status: Home / Self Care | Payer: Self-pay | Source: Home / Self Care | Attending: Emergency Medicine | Admitting: Emergency Medicine

## 2015-03-24 ENCOUNTER — Encounter (HOSPITAL_COMMUNITY): Payer: Self-pay | Admitting: Emergency Medicine

## 2015-03-24 ENCOUNTER — Emergency Department (INDEPENDENT_AMBULATORY_CARE_PROVIDER_SITE_OTHER): Payer: Self-pay

## 2015-03-24 DIAGNOSIS — J40 Bronchitis, not specified as acute or chronic: Secondary | ICD-10-CM

## 2015-03-24 MED ORDER — AZITHROMYCIN 250 MG PO TABS: ORAL_TABLET | ORAL | Status: DC

## 2015-03-24 MED ORDER — HYDROCODONE-HOMATROPINE 5-1.5 MG/5ML PO SYRP: 5.0000 mL | ORAL_SOLUTION | Freq: Four times a day (QID) | ORAL | Status: DC | PRN

## 2015-03-24 MED ORDER — PREDNISONE 50 MG PO TABS: ORAL_TABLET | ORAL | Status: DC

## 2015-03-24 NOTE — Discharge Instructions (Signed)
You have bronchitis. °Take azithromycin and prednisone as prescribed. °Use hycodan as needed for cough. °You should see improvement in the next 3-5 days. °If you develop fevers, difficulty breathing, or are just not getting better, please come back or go to the emergency room. ° °

## 2015-03-24 NOTE — ED Provider Notes (Signed)
CSN: [id number]     Arrival date & time 03/24/15  1532 History   First MD Initiated Contact with Patient 03/24/15 1631     Chief Complaint  Patient presents with  . Cough   (Consider location/radiation/quality/duration/timing/severity/associated sxs/prior Treatment) HPI  She is a 39 year old woman here for evaluation of cough. She reports a cough for the last 2 weeks. It is productive of a white phlegm. She also reports chest pain and back pain with coughing. She denies any fevers or chills. She describes some intermittent postnasal drainage and a slightly scratchy throat. Denies significant nasal congestion. No nausea or vomiting.  Past Medical History  Diagnosis Date  . Asthma     mild by PFT's 03/16/1998  . Sciatica 01/12/06  . History of abnormal Pap smear     s/p cryo( Dr. Wiliam Ke)  . Cervical lymphadenopathy     biopsy negativeMarvetta Gibbons)  . Normal vaginal delivery     x3  . Complete spontaneous abortion     x2  . Hypertension   . HSV (herpes simplex virus) anogenital infection   . GERD (gastroesophageal reflux disease)   . 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   . Vaginal Pap smear, abnormal    Past Surgical History  Procedure Laterality Date  . Biopsy of lymph node in r neck  2007.  Marland Kitchen Leep     Family History  Problem Relation Age of Onset  . Ulcerative colitis Mother   . Hypertension Mother   . Diabetes Father   . Heart attack Father   . Hypertension Father   . Diabetes Sister   . Heart attack Maternal Grandmother    Social History  Substance Use Topics  . Smoking status: Never Smoker   . Smokeless tobacco: Never Used  . Alcohol Use: 0.0 oz/week    0 Standard drinks or equivalent per week     Comment: occasional beer and liquor on weekends   OB History    Gravida Para Term Preterm AB TAB SAB Ectopic Multiple  Living   6 3 2 1 3  3   3      Review of Systems As in history of present illness Allergies  Sulfamethoxazole-trimethoprim and Amoxicillin  Home Medications   Prior to Admission medications   Medication Sig Start Date End Date Taking? Authorizing Provider  amLODipine (NORVASC) 5 MG tablet Take 1 tablet (5 mg total) by mouth daily. 08/07/13  Yes 10/07/13, MD  cetirizine (ZYRTEC) 10 MG tablet Take 1 tablet (10 mg total) by mouth daily. 11/02/13  Yes 11/04/13, MD  levonorgestrel-ethinyl estradiol (SEASONALE,INTROVALE,JOLESSA) 0.15-0.03 MG tablet Take 1 tablet by mouth daily. 01/03/15  Yes 01/05/15, MD  mesalamine (CANASA) 1000 MG suppository Place 1,000 mg rectally at bedtime.   Yes Historical Provider, MD  albuterol (PROVENTIL HFA) 108 (90 BASE) MCG/ACT inhaler Inhale 2 puffs into the lungs every 6 (six) hours as needed for wheezing. 05/23/14 06/21/16  06/23/16, MD  azithromycin (ZITHROMAX Z-PAK) 250 MG tablet Take 2 pills today, then 1 pill daily until gone. 03/24/15   03/26/15, MD  cyclopentolate (CYCLODRYL,CYCLOGYL) 1 % ophthalmic solution Place 1 drop into the left eye 2 (two) times a week. 01/05/14   Historical Provider, MD  famotidine (PEPCID) 20 MG tablet Take 1 tablet (20 mg total) by mouth 2 (  two) times daily. 05/23/14 05/23/15  Lorenda Hatchet, MD  HYDROcodone-homatropine [facility]) 5-1.5 MG/5ML syrup Take 5 mLs by mouth every 6 (six) hours as needed for cough. 03/24/15   Charm Rings, MD  HYDROmorphone (DILAUDID) 2 MG tablet Take 1 tablet (2 mg total) by mouth every 4 (four) hours as needed for severe pain. 11/02/14   Brock Bad, MD  loratadine (CLARITIN) 10 MG tablet Take 1 tablet (10 mg total) by mouth daily. 05/23/14   Lorenda Hatchet, MD  methocarbamol (ROBAXIN) 500 MG tablet Take 1 tablet (500 mg total) by mouth every 8 (eight) hours as needed for muscle spasms. 01/26/15   Nicole Pisciotta, PA-C  Multiple Vitamin (MULITIVITAMIN WITH MINERALS) TABS Take 1  tablet by mouth daily.    Historical Provider, MD  Naproxen Sodium (ALEVE) 220 MG CAPS Take 1 capsule (220 mg total) by mouth 2 (two) times daily as needed (For headache.). 09/12/14   Otis Brace, MD  predniSONE (DELTASONE) 50 MG tablet Take 1 pill daily for 5 days. 03/24/15   Charm Rings, MD  valACYclovir (VALTREX) 500 MG tablet Take 1 tablet by mouth twice a day for 3 days. 07/09/14   Brock Bad, MD   Meds Ordered and Administered this Visit  Medications - No data to display  BP 131/80 mmHg  Pulse 83  Temp(Src) 98.5 F (36.9 C) (Oral)  Resp 20  SpO2 100%  LMP 01/01/2015 No data found.   Physical Exam  Constitutional: She is oriented to person, place, and time. She appears well-developed and well-nourished. No distress.  HENT:  Nose: Nose normal.  Mouth/Throat: No oropharyngeal exudate.  Small amount of clear postnasal drainage.  Neck: Neck supple.  Cardiovascular: Normal rate, regular rhythm and normal heart sounds.   No murmur heard. Pulmonary/Chest: Effort normal and breath sounds normal. No respiratory distress. She has no wheezes. She has no rales. She exhibits tenderness.  Musculoskeletal:  Tender to palpation left upper back  Lymphadenopathy:    She has no cervical adenopathy.  Neurological: She is alert and oriented to person, place, and time.    ED Course  Procedures (including critical care time)  Labs Review Labs Reviewed - No data to display  Imaging Review Dg Chest 2 View  03/24/2015  CLINICAL DATA:  Cough EXAM: CHEST  2 VIEW COMPARISON:  04/06/1958 chest radiograph. FINDINGS: Stable cardiomediastinal silhouette with normal heart size. No pneumothorax. No pleural effusion. Clear lungs, with no focal lung consolidation and no pulmonary edema. IMPRESSION: No active cardiopulmonary disease. Electronically Signed   By: Delbert Phenix M.D.   On: 03/24/2015 17:35      MDM   1. Bronchitis    X-ray negative for pneumonia. Treat with azithromycin and  prednisone. Hycodan as needed for cough. Return precautions reviewed.    Charm Rings, MD 03/24/15 [phone]

## 2015-03-24 NOTE — ED Notes (Signed)
C/o a prod cough onset x2 weeks associated w/CP and back pain Has been taking OTC cough meds w/no relief A&O x4... No acute distress.

## 2015-05-22 ENCOUNTER — Ambulatory Visit (INDEPENDENT_AMBULATORY_CARE_PROVIDER_SITE_OTHER): Payer: Self-pay | Admitting: Internal Medicine

## 2015-05-22 ENCOUNTER — Ambulatory Visit (HOSPITAL_COMMUNITY)
Admission: RE | Admit: 2015-05-22 | Discharge: 2015-05-22 | Disposition: A | Discharge status: Home / Self Care | Payer: Medicaid Other | Source: Ambulatory Visit | Attending: Internal Medicine | Admitting: Internal Medicine

## 2015-05-22 ENCOUNTER — Encounter: Payer: Self-pay | Admitting: Internal Medicine

## 2015-05-22 VITALS — BP 137/82 | HR 83 | Temp 98.1°F | Ht 64.0 in | Wt 176.7 lb

## 2015-05-22 DIAGNOSIS — M533 Sacrococcygeal disorders, not elsewhere classified: Secondary | ICD-10-CM | POA: Insufficient documentation

## 2015-05-22 NOTE — Patient Instructions (Signed)
1. Please follow up in 4 weeks.   2. Please take all medications as previously prescribed with the following changes:  Continue to use heating pad, OTC pain control.  I have placed a referral for Dr. Regino Schultze as well as PT.   3. If you have worsening of your symptoms or new symptoms arise, please call the clinic [phone]), or go to the ER immediately if symptoms are severe.

## 2015-05-22 NOTE — Assessment & Plan Note (Addendum)
Patient with severe pain in the SI joint bilaterally, L>R. Has had this for some time. Given her history of anterior uveitis (x3), and ulcerative proctitis, raises suspicion for overarching rheumatological issue such as sacroiliitis. ESR within normal limits, although CRP elevated. HIV negative. If truly sacroiliitis, would not expect discordant values. 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 Per the literature, this is defined as sclerosis that is often confined to the iliac side of the SI joint, but can also be found in other parts. The SI joints do not typically show erosions or fusion, but patients, especially women who have given birth, might have chronic back pain for mechanical reasons. No sacroiliitis seen. However, given extra-articular symptoms and elevated CRP, could still make clinical diagnosis of sacroilitis, however, would need an HLA-B27 as well.  -Refer to ortho for steroid injection (has had this before) -PT -RTC in 4 weeks -Check HLA-B27 at next clinic visit.

## 2015-05-22 NOTE — Progress Notes (Signed)
Subjective:   Patient ID: Sue [name] female   DOB: 10/01/1975 40 y.o.   MRN: [id number]  HPI: Sue [name] is a 40 y.o. female w/ PMHx of HTN, Asthma, GERD, and ulcerative Proctitis, presents to the clinic today for an acute visit for back pain. Patient has had this for years, but states that it has become worse recently. Has a hard time walking, lying still, and sleeping comfortably due to the pain. Nothing she takes helps. She has been taking Mobic, but this does nothing for her pain, and Tramadol and other narcotics are too strong and make her sick. She has previously seen PM&R and ortho for SI joint injections and says that this has helped in the past. She claims this pain started after she had her children. Patient denies any incontinence (has mild stress incontinence but has since her pregnancies), saddle anesthesia, or significant weakness in the LE's.   Also of note, patient has a h/o ulcerative proctitis, and previous h/o anterior uveitis as well. Takes Mesalamine for her proctitis.   Past Medical History  Diagnosis Date  . Asthma     mild by PFT's 03/16/1998  . Sciatica 01/12/06  . History of abnormal Pap smear     s/p cryo( Dr. Wiliam Ke)  . Cervical lymphadenopathy     biopsy negativeMarvetta Gibbons)  . Normal vaginal delivery     x3  . Complete spontaneous abortion     x2  . Hypertension   . HSV (herpes simplex virus) anogenital infection   . GERD (gastroesophageal reflux disease)   . 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   . Vaginal Pap smear, abnormal    Current Outpatient Prescriptions  Medication Sig Dispense Refill  . albuterol (PROVENTIL HFA) 108 (90 BASE) MCG/ACT inhaler Inhale 2 puffs into the lungs every 6 (six) hours as needed for wheezing. 1 Inhaler 6  . amLODipine (NORVASC) 5 MG tablet Take 1 tablet (5 mg total)  by mouth daily. 30 tablet 11  . azithromycin (ZITHROMAX Z-PAK) 250 MG tablet Take 2 pills today, then 1 pill daily until gone. 6 tablet 0  . cetirizine (ZYRTEC) 10 MG tablet Take 1 tablet (10 mg total) by mouth daily. 30 tablet 6  . cyclopentolate (CYCLODRYL,CYCLOGYL) 1 % ophthalmic solution Place 1 drop into the left eye 2 (two) times a week.  0  . famotidine (PEPCID) 20 MG tablet Take 1 tablet (20 mg total) by mouth 2 (two) times daily. 60 tablet 0  . HYDROcodone-homatropine (HYCODAN) 5-1.5 MG/5ML syrup Take 5 mLs by mouth every 6 (six) hours as needed for cough. 120 mL 0  . HYDROmorphone (DILAUDID) 2 MG tablet Take 1 tablet (2 mg total) by mouth every 4 (four) hours as needed for severe pain. 40 tablet 0  . levonorgestrel-ethinyl estradiol (SEASONALE,INTROVALE,JOLESSA) 0.15-0.03 MG tablet Take 1 tablet by mouth daily. 1 Package 3  . loratadine (CLARITIN) 10 MG tablet Take 1 tablet (10 mg total) by mouth daily. 30 tablet 0  . mesalamine (CANASA) 1000 MG suppository Place 1,000 mg rectally at bedtime.    . methocarbamol (ROBAXIN) 500 MG tablet Take 1 tablet (500 mg total) by mouth every 8 (eight) hours as needed for muscle spasms. 20 tablet 0  . Multiple Vitamin (MULITIVITAMIN WITH MINERALS) TABS Take 1 tablet by mouth daily.    . Naproxen Sodium (  ALEVE) 220 MG CAPS Take 1 capsule (220 mg total) by mouth 2 (two) times daily as needed (For headache.). 60 each 0  . predniSONE (DELTASONE) 50 MG tablet Take 1 pill daily for 5 days. 5 tablet 0  . valACYclovir (VALTREX) 500 MG tablet Take 1 tablet by mouth twice a day for 3 days. 30 tablet prn   No current facility-administered medications for this visit.    Review of Systems: General: Denies fever, chills, diaphoresis, appetite change and fatigue.  Respiratory: Denies SOB, DOE, cough, and wheezing.   Cardiovascular: Denies chest pain and palpitations.  Gastrointestinal: Denies nausea, vomiting, abdominal pain, and diarrhea.  Genitourinary: Denies  dysuria, increased frequency, and flank pain. Endocrine: Denies hot or cold intolerance, polyuria, and polydipsia. Musculoskeletal: Positive for back pain. Denies myalgias, joint swelling, arthralgias and gait problem.  Skin: Denies pallor, rash and wounds.  Neurological: Denies dizziness, seizures, syncope, weakness, lightheadedness, numbness and headaches.  Psychiatric/Behavioral: Denies mood changes, and sleep disturbances.  Objective:   Physical Exam: Filed Vitals:   05/22/15 1549  BP: 137/82  Pulse: 83  Temp: 98.1 F (36.7 C)  TempSrc: Oral  Height: 5\' 4"  (1.626 m)  Weight: 176 lb 11.2 oz (80.151 kg)  SpO2: 100%    General: Alert, cooperative, NAD. HEENT: PERRL, EOMI. Moist mucus membranes Neck: Full range of motion without pain, supple, no lymphadenopathy or carotid bruits Lungs: Clear to ascultation bilaterally, normal work of respiration, no wheezes, rales, rhonchi Heart: RRR, no murmurs, gallops, or rubs Abdomen: Soft, non-tender, non-distended, BS + Back: No tenderness over the lumbar spine. Tender over SI joints bilaterally, L > R. Positive straight leg raise on the left, however, I suspect this is more related to SI pain rather than sciatic pain.  Extremities: No cyanosis, clubbing, or edema Neurologic: Alert & oriented X3, cranial nerves II-XII intact, strength grossly intact, sensation intact to light touch   Assessment & Plan:   Please see problem based assessment and plan.

## 2015-05-23 LAB — C-REACTIVE PROTEIN: CRP: 19.7 mg/L — ABNORMAL HIGH (ref 0.0–4.9)

## 2015-05-23 LAB — SEDIMENTATION RATE: Sed Rate: 17 mm/hr (ref 0–32)

## 2015-05-23 LAB — HIV ANTIBODY (ROUTINE TESTING W REFLEX): HIV Screen 4th Generation wRfx: NONREACTIVE

## 2015-05-24 NOTE — Progress Notes (Signed)
Internal Medicine Clinic Attending  Case discussed with Dr. Jones soon after the resident saw the patient.  We reviewed the resident's history and exam and pertinent patient test results.  I agree with the assessment, diagnosis, and plan of care documented in the resident's note. 

## 2015-05-27 ENCOUNTER — Telehealth: Payer: Self-pay | Admitting: Internal Medicine

## 2015-05-27 NOTE — Telephone Encounter (Signed)
Pt asking for results from bloodwork

## 2015-05-31 ENCOUNTER — Ambulatory Visit: Payer: Medicaid Other | Admitting: Physical Therapy

## 2015-06-05 NOTE — Telephone Encounter (Signed)
Per dr Yetta Barre he has spoken to pt and discussed results and f/u

## 2015-06-12 ENCOUNTER — Ambulatory Visit: Payer: Medicaid Other

## 2015-06-19 ENCOUNTER — Ambulatory Visit: Payer: Medicaid Other | Attending: Internal Medicine

## 2015-06-19 DIAGNOSIS — M6281 Muscle weakness (generalized): Secondary | ICD-10-CM | POA: Diagnosis present

## 2015-06-19 DIAGNOSIS — M5440 Lumbago with sciatica, unspecified side: Secondary | ICD-10-CM

## 2015-06-19 DIAGNOSIS — R252 Cramp and spasm: Secondary | ICD-10-CM | POA: Insufficient documentation

## 2015-06-19 DIAGNOSIS — R262 Difficulty in walking, not elsewhere classified: Secondary | ICD-10-CM | POA: Diagnosis present

## 2015-06-19 NOTE — Patient Instructions (Signed)

## 2015-06-19 NOTE — Therapy (Signed)
[facility] [address] [place], [place], [address] Phone: [phone]   Fax:  [phone]  Physical Therapy Evaluation  Patient Details  Name: Sue [name] MRN: [id number] Date of Birth: [birth year] Referring Provider: Lars Masson   Encounter Date: 06/19/2015      PT End of Session - 06/19/15 1734    Visit Number 1   Number of Visits 1   PT Start Time 1630   PT Stop Time 1725   PT Time Calculation (min) 55 min   Activity Tolerance Patient tolerated treatment well   Behavior During Therapy [facility] for tasks assessed/performed      Past Medical History  Diagnosis Date  . Asthma     mild by PFT's 03/16/1998  . Sciatica 01/12/06  . History of abnormal Pap smear     s/p cryo( Dr. Wiliam Ke)  . Cervical lymphadenopathy     biopsy negativeMarvetta Gibbons)  . Normal vaginal delivery     x3  . Complete spontaneous abortion     x2  . Hypertension   . HSV (herpes simplex virus) anogenital infection   . GERD (gastroesophageal reflux disease)   . 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   . Vaginal Pap smear, abnormal     Past Surgical History  Procedure Laterality Date  . Biopsy of lymph node in r neck  2007.  Marland Kitchen Leep      There were no vitals filed for this visit.       Subjective Assessment - 06/19/15 1638    Subjective Pain in SI joint area began after childbirth about 10 years ago. Pt has been attending pain management and MDs for injection into spine/ SI joint with relief initially, but no longer feels effects. When "flared up" pain is severe with changing positions and sidelying, on L more than R.    Pertinent History HTN, asthma   Limitations Sitting;Lifting;Standing;Walking;House hold activities   How long can you sit comfortably? 10- 15 mins   How long can you stand  comfortably? 30 mins    How long can you walk comfortably? 30 mins    Diagnostic tests Xray findings suggest osteitis condensans   Patient Stated Goals Est HEP.  [facility]     Currently in Pain? Yes   Pain Score 0-No pain  10/10    Pain Location Other (Comment)  mid glute bilat    Pain Descriptors / Indicators Sharp;Shooting;Spasm;Pins and needles   Pain Type Acute pain   Pain Radiating Towards bilat feet plantar surface   Pain Onset More than a month ago   Pain Frequency Intermittent   Aggravating Factors  movements    Pain Relieving Factors prednisone             [facility] PT Assessment - 06/19/15 0001    Assessment   Medical Diagnosis SI pain    Referring Provider Lars Masson    Onset Date/Surgical Date 06/18/05   Hand Dominance Right   Next MD Visit 06/27/15   Prior Therapy none   Precautions   Precautions None   Restrictions   Weight Bearing Restrictions No   Balance Screen   Has the patient fallen in the past 6 months No   Home Environment   Living Environment Private residence   Prior Function   Level of Independence Independent   Cognition  Overall Cognitive Status Within Functional Limits for tasks assessed   ROM / Strength   AROM / PROM / Strength AROM;Strength   AROM   AROM Assessment Site Lumbar   Lumbar Flexion 80  end range pain    Lumbar Extension 10   end range pain    Lumbar - Right Side Bend 10   Lumbar - Left Side Bend 10   Strength   Strength Assessment Site Hip   Right/Left Hip Right;Left   Right Hip Flexion 3-/5  limited by pain    Left Hip Flexion 3-/5  limited by pain    Special Tests    Special Tests Sacrolliac Tests;Leg LengthTest   Sacroiliac Tests  Sacral Compression   Leg length test  Apparent   Pelvic Compression   Findings Positive   Sacral thrust    Findings Positive   Sacral Compression   Findings Positive   Apparent   Comments Post Rotated L innominate                            PT Education -  06/19/15 1731    Education provided Yes   Education Details [facility] one visit only. [facility] recommended. Posture education. Correlation of weight loss and pain. SI belt use. Pre-pilates HEP. Muscle energy technique for L post-rotated innominate.    Person(s) Educated Patient   Methods Explanation;Handout   Comprehension Verbalized understanding;Returned demonstration          PT Short Term Goals - 06/19/15 1735    PT SHORT TERM GOAL #1   Title Establish HEP on first visit.    Time 1   Period Days   Status Achieved   PT SHORT TERM GOAL #2   Title Instruct in importance of posture on first visit.    Time 1   Period Days   Status Achieved                  Plan - 06/19/15 1736    Clinical Impression Statement Pt presents with LBP compatible with SI joint dysfunction. Imaging indicates findings that suggest osteitis condesans ilii. Pt would benefit from continued PT treatment and education, but was referred to [facility] clinic at [facility] for continued tx due to only one visit for Medicaid approved. Visit today established HEP, and consisted of extensive patient education listed above. Eval only due to [employer] restrictions.    Rehab Potential Good   PT Frequency 1x / week   PT Treatment/Interventions ADLs/Self Care Home Management;DME Instruction;Patient/family education;Therapeutic exercise   PT Next Visit Plan One visit only    PT Home Exercise Plan pre-pilates, recommended wt loss, postural education, follow-up at [facility] clinic.    Consulted and Agree with Plan of Care Patient      Patient will benefit from skilled therapeutic intervention in order to improve the following deficits and impairments:  Abnormal gait, Decreased range of motion, Difficulty walking, Impaired flexibility, Improper body mechanics, Postural dysfunction, Decreased mobility, Decreased strength, Pain, Increased muscle spasms, Decreased activity tolerance  Visit Diagnosis: Low back pain with  sciatica, sciatica laterality unspecified, unspecified back pain laterality - Plan: PT plan of care cert/re-cert  Cramp and spasm - Plan: PT plan of care cert/re-cert  Muscle weakness (generalized) - Plan: PT plan of care cert/re-cert  Difficulty in walking, not elsewhere classified - Plan: PT plan of care cert/re-cert     Problem List Patient Active Problem List   Diagnosis Date  Noted  . Throat swelling 10/31/2014  . Healthcare maintenance 09/12/2014  . Acute anterior uveitis 12/13/2013  . Mid back pain 11/02/2013  . Seasonal allergies 11/02/2013  . 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 Lumbago 05/18/2011  . Carpal tunnel syndrome 04/10/2011  . Tension headache 04/10/2011  . HERNIATED DISC 12/31/2009  . OTHER VOICE AND RESONANCE DISORDERS 01/30/2008  . 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 Essential hypertension 06/15/2007  . Asthma with acute exacerbation 02/08/2006  . SCIATICA 02/08/2006  . PAP SMEAR, LGSIL, ABNORMAL 02/08/2006    Haze Rushing, PT 06/19/2015, 5:47 PM  [facility] [address] [place], [place], [address] Phone: [phone]   Fax:  [phone]  Name: Sue [name] MRN: [id number] Date of Birth: 1975-05-18

## 2015-07-25 ENCOUNTER — Encounter: Payer: Self-pay | Admitting: Internal Medicine

## 2015-07-25 ENCOUNTER — Ambulatory Visit (INDEPENDENT_AMBULATORY_CARE_PROVIDER_SITE_OTHER): Payer: Medicaid Other | Admitting: Internal Medicine

## 2015-07-25 VITALS — BP 130/77 | HR 80 | Temp 98.1°F | Wt 179.6 lb

## 2015-07-25 DIAGNOSIS — M533 Sacrococcygeal disorders, not elsewhere classified: Secondary | ICD-10-CM

## 2015-07-25 DIAGNOSIS — R300 Dysuria: Secondary | ICD-10-CM | POA: Diagnosis not present

## 2015-07-25 LAB — POCT URINALYSIS DIPSTICK
Glucose, UA: NEGATIVE
Leukocytes, UA: NEGATIVE
Nitrite, UA: NEGATIVE
Protein, UA: NEGATIVE
Spec Grav, UA: 1.025
Urobilinogen, UA: 0.2
pH, UA: 5.5

## 2015-07-25 LAB — POCT URINE PREGNANCY: Preg Test, Ur: NEGATIVE

## 2015-07-25 NOTE — Progress Notes (Signed)
Subjective:   Patient ID: Sue [name] female   DOB: 1975/08/23 40 y.o.   MRN: [id number]  HPI: Sue [name] is a 40 y.o. female w/ PMHx of HTN, Asthma, GERD, and ulcerative Proctitis, presents to the clinic for follow up visit for back pain. Patient with suspected sacroiliitis, given her previous history of UC and uveitis, however, this was not seen on XR. Has been seen by orthopedics for sacroiliac injections but continues to have pain. Had CRP in the past, and repeat labs at ortho showed increased ESR and CRP, negative RF, and negative ANA. Today, she seems to be doing quite well with regards to her sacroiliac pain since she just had recent injections for this. She does however say she is having left flank pain that feels like a kidney infection or renal stone that she has had in the past. She does admit to some recent cloudy urine and mild dysuria. She denies fevers, chills, nausea, hematuria, or abdominal pain.   Past Medical History  Diagnosis Date  . Asthma     mild by PFT's 03/16/1998  . Sciatica 01/12/06  . History of abnormal Pap smear     s/p cryo( Dr. Alferd Apa)  . Cervical lymphadenopathy     biopsy negativeGwenlyn Saran)  . Normal vaginal delivery     x3  . Complete spontaneous abortion     x2  . Hypertension   . HSV (herpes simplex virus) anogenital infection   . GERD (gastroesophageal reflux disease)   . 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   . Vaginal Pap smear, abnormal    Current Outpatient Prescriptions  Medication Sig Dispense Refill  . albuterol (PROVENTIL HFA) 108 (90 BASE) MCG/ACT inhaler Inhale 2 puffs into the lungs every 6 (six) hours as needed for wheezing. 1 Inhaler 6  . amLODipine (NORVASC) 5 MG tablet Take 1 tablet (5 mg total) by mouth daily. 30 tablet 11  . azithromycin (ZITHROMAX Z-PAK) 250 MG tablet  Take 2 pills today, then 1 pill daily until gone. (Patient not taking: Reported on 06/19/2015) 6 tablet 0  . cetirizine (ZYRTEC) 10 MG tablet Take 1 tablet (10 mg total) by mouth daily. 30 tablet 6  . cyclopentolate (CYCLODRYL,CYCLOGYL) 1 % ophthalmic solution Place 1 drop into the left eye 2 (two) times a week. Reported on 06/19/2015  0  . famotidine (PEPCID) 20 MG tablet Take 1 tablet (20 mg total) by mouth 2 (two) times daily. 60 tablet 0  . HYDROcodone-homatropine (HYCODAN) 5-1.5 MG/5ML syrup Take 5 mLs by mouth every 6 (six) hours as needed for cough. (Patient not taking: Reported on 06/19/2015) 120 mL 0  . HYDROmorphone (DILAUDID) 2 MG tablet Take 1 tablet (2 mg total) by mouth every 4 (four) hours as needed for severe pain. (Patient not taking: Reported on 06/19/2015) 40 tablet 0  . levonorgestrel-ethinyl estradiol (SEASONALE,INTROVALE,JOLESSA) 0.15-0.03 MG tablet Take 1 tablet by mouth daily. 1 Package 3  . loratadine (CLARITIN) 10 MG tablet Take 1 tablet (10 mg total) by mouth daily. 30 tablet 0  . mesalamine (CANASA) 1000 MG suppository Place 1,000 mg rectally at bedtime.    . methocarbamol (ROBAXIN) 500 MG tablet Take 1 tablet (500 mg total) by mouth every 8 (eight) hours as needed for muscle spasms. (Patient not taking: Reported on 06/19/2015) 20 tablet 0  . Multiple Vitamin (  MULITIVITAMIN WITH MINERALS) TABS Take 1 tablet by mouth daily.    . Naproxen Sodium (ALEVE) 220 MG CAPS Take 1 capsule (220 mg total) by mouth 2 (two) times daily as needed (For headache.). (Patient not taking: Reported on 06/19/2015) 60 each 0  . predniSONE (DELTASONE) 50 MG tablet Take 1 pill daily for 5 days. (Patient not taking: Reported on 06/19/2015) 5 tablet 0  . valACYclovir (VALTREX) 500 MG tablet Take 1 tablet by mouth twice a day for 3 days. 30 tablet prn   No current facility-administered medications for this visit.    Review of Systems: General: Denies fever, chills, diaphoresis, appetite change and  fatigue.  Respiratory: Denies SOB, DOE, cough, and wheezing.   Cardiovascular: Denies chest pain and palpitations.  Gastrointestinal: Denies nausea, vomiting, abdominal pain, and diarrhea.  Genitourinary: Denies dysuria, increased frequency, and flank pain. Endocrine: Denies hot or cold intolerance, polyuria, and polydipsia. Musculoskeletal: Positive for back pain/left flank pain. Denies myalgias, joint swelling, arthralgias and gait problem.  Skin: Denies pallor, rash and wounds.  Neurological: Denies dizziness, seizures, syncope, weakness, lightheadedness, numbness and headaches.  Psychiatric/Behavioral: Denies mood changes, and sleep disturbances.  Objective:   Physical Exam: Filed Vitals:   07/25/15 1433  BP: 130/77  Pulse: 80  Temp: 98.1 F (36.7 C)  TempSrc: Oral  Weight: 179 lb 9.6 oz (81.466 kg)  SpO2: 100%    General: Alert, cooperative, NAD. HEENT: PERRL, EOMI. Moist mucus membranes Neck: Full range of motion without pain, supple, no lymphadenopathy or carotid bruits Lungs: Clear to ascultation bilaterally, normal work of respiration, no wheezes, rales, rhonchi Heart: RRR, no murmurs, gallops, or rubs Abdomen: Soft, non-tender, non-distended, BS + Back: Mild tenderness over the left flank. Otherwise unremarkable.  Extremities: No cyanosis, clubbing, or edema Neurologic: Alert & oriented X3, cranial nerves II-XII intact, strength grossly intact, sensation intact to light touch   Assessment & Plan:   Please see problem based assessment and plan.

## 2015-07-25 NOTE — Patient Instructions (Addendum)
1. Please make a follow up appointment in 1 week.   I will call you once your lab results come back regarding HLA-B27.   I have placed a referral for rheumatology as well.  2. Please take all medications as previously prescribed.  If you have worsening flank pain or groin pain and notice blood in your urine, come to the ED for CT scan to look  For a kidney stone.   HYDRATE.   3. If you have worsening of your symptoms or new symptoms arise, please call the clinic ([phone]), or go to the ER immediately if symptoms are severe.

## 2015-07-26 LAB — URINALYSIS, ROUTINE W REFLEX MICROSCOPIC
Bilirubin, UA: NEGATIVE
Glucose, UA: NEGATIVE
Leukocytes, UA: NEGATIVE
Nitrite, UA: NEGATIVE
Protein, UA: NEGATIVE
Specific Gravity, UA: 1.026 (ref 1.005–1.030)
Urobilinogen, Ur: 0.2 mg/dL (ref 0.2–1.0)
pH, UA: 6 (ref 5.0–7.5)

## 2015-07-26 LAB — MICROSCOPIC EXAMINATION
Casts: NONE SEEN /lpf
Epithelial Cells (non renal): NONE SEEN /hpf (ref 0–10)

## 2015-07-26 NOTE — Assessment & Plan Note (Signed)
Patient complaining of left flank pain, dysuria, cloudy urine with a strong odor. Says she has had this pain before when she had a kidney infection and when she had a kidney stone. Says the pain is not severe. No fever, chills, nausea, abdominal pain, or diarrhea. Pain is isolated to the left flank, does not radiate. No groin pain or frank hematuria. Urine was significant for trace RBC's and calcium oxalate crystals, therefore, patient may very well have renal stone. Given her level of discomfort, do not think she requires further management at this time.  -Advised adequate hydration, pain control with OTC medications -If pain worsens, she is to call the clinic or come to the ED for imaging to further assess presence of renal stone.  -RTC in 1 week for follow up, can repeat urine studies at that time if necessary

## 2015-07-26 NOTE — Assessment & Plan Note (Addendum)
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 She has not had XR findings suggestive of sacroiliitis up to this point, but does have a history of elevated ESR and CRP most recently. She has also been shown to have normal ANA and RF. Given high level of suspicion for underlying autoimmune disease, plan is as follows: -Check HLA-B27 -Refer to rheumatology, may be candidate for medical therapy such as sulfasalazine, etc.

## 2015-07-27 ENCOUNTER — Emergency Department (HOSPITAL_COMMUNITY)
Admission: EM | Admit: 2015-07-27 | Discharge: 2015-07-28 | Disposition: A | Discharge status: Home / Self Care | Payer: Medicaid Other | Attending: Emergency Medicine | Admitting: Emergency Medicine

## 2015-07-27 ENCOUNTER — Emergency Department (HOSPITAL_COMMUNITY): Payer: Medicaid Other

## 2015-07-27 ENCOUNTER — Encounter (HOSPITAL_COMMUNITY): Payer: Self-pay | Admitting: Emergency Medicine

## 2015-07-27 DIAGNOSIS — Z7951 Long term (current) use of inhaled steroids: Secondary | ICD-10-CM | POA: Diagnosis not present

## 2015-07-27 DIAGNOSIS — R7989 Other specified abnormal findings of blood chemistry: Secondary | ICD-10-CM | POA: Diagnosis not present

## 2015-07-27 DIAGNOSIS — J45909 Unspecified asthma, uncomplicated: Secondary | ICD-10-CM | POA: Diagnosis not present

## 2015-07-27 DIAGNOSIS — Z791 Long term (current) use of non-steroidal anti-inflammatories (NSAID): Secondary | ICD-10-CM | POA: Insufficient documentation

## 2015-07-27 DIAGNOSIS — Z79891 Long term (current) use of opiate analgesic: Secondary | ICD-10-CM | POA: Insufficient documentation

## 2015-07-27 DIAGNOSIS — R319 Hematuria, unspecified: Secondary | ICD-10-CM

## 2015-07-27 DIAGNOSIS — I10 Essential (primary) hypertension: Secondary | ICD-10-CM | POA: Diagnosis not present

## 2015-07-27 DIAGNOSIS — R109 Unspecified abdominal pain: Secondary | ICD-10-CM

## 2015-07-27 HISTORY — DX: Calculus of kidney: N20.0

## 2015-07-27 LAB — BASIC METABOLIC PANEL
Anion gap: 6 (ref 5–15)
BUN: 11 mg/dL (ref 6–20)
CO2: 25 mmol/L (ref 22–32)
Calcium: 9 mg/dL (ref 8.9–10.3)
Chloride: 105 mmol/L (ref 101–111)
Creatinine, Ser: 1.13 mg/dL — ABNORMAL HIGH (ref 0.44–1.00)
GFR calc Af Amer: >60 mL/min (ref >60)
GFR calc non Af Amer: >60 mL/min (ref >60)
Glucose, Bld: 108 mg/dL — ABNORMAL HIGH (ref 65–99)
Potassium: 4.1 mmol/L (ref 3.5–5.1)
Sodium: 136 mmol/L (ref 135–145)

## 2015-07-27 LAB — URINALYSIS, ROUTINE W REFLEX MICROSCOPIC
Bilirubin Urine: NEGATIVE
Glucose, UA: NEGATIVE mg/dL
Ketones, ur: NEGATIVE mg/dL
Leukocytes, UA: NEGATIVE
Nitrite: NEGATIVE
Protein, ur: NEGATIVE mg/dL
Specific Gravity, Urine: 1.025 (ref 1.005–1.030)
pH: 6 (ref 5.0–8.0)

## 2015-07-27 LAB — URINE MICROSCOPIC-ADD ON: WBC, UA: NONE SEEN WBC/hpf (ref 0–5)

## 2015-07-27 LAB — CBC
HCT: 37.7 % (ref 36.0–46.0)
Hemoglobin: 12.9 g/dL (ref 12.0–15.0)
MCH: 28.7 pg (ref 26.0–34.0)
MCHC: 34.2 g/dL (ref 30.0–36.0)
MCV: 84 fL (ref 78.0–100.0)
Platelets: 361 10*3/uL (ref 150–400)
RBC: 4.49 MIL/uL (ref 3.87–5.11)
RDW: 14.4 % (ref 11.5–15.5)
WBC: 7.1 10*3/uL (ref 4.0–10.5)

## 2015-07-27 LAB — I-STAT BETA HCG BLOOD, ED (MC, WL, AP ONLY): I-stat hCG, quantitative: <5 m[IU]/mL (ref <5)

## 2015-07-27 MED ORDER — KETOROLAC TROMETHAMINE 30 MG/ML IJ SOLN
30.0000 mg | Freq: Once | INTRAMUSCULAR | Status: AC
  Administered 2015-07-28: 30 mg via INTRAVENOUS
  Filled 2015-07-27: qty 1

## 2015-07-27 MED ORDER — FENTANYL CITRATE (PF) 100 MCG/2ML IJ SOLN
50.0000 ug | Freq: Once | INTRAMUSCULAR | Status: AC
  Administered 2015-07-27: 50 ug via INTRAVENOUS
  Filled 2015-07-27: qty 2

## 2015-07-27 MED ORDER — SODIUM CHLORIDE 0.9 % IV BOLUS (SEPSIS)
500.0000 mL | Freq: Once | INTRAVENOUS | Status: AC
  Administered 2015-07-27: 500 mL via INTRAVENOUS

## 2015-07-27 NOTE — ED Provider Notes (Signed)
CSN: [id number]     Arrival date & time 07/27/15  1824 History   First MD Initiated Contact with Patient 07/27/15 2013     Chief Complaint  Patient presents with  . Flank Pain     (Consider location/radiation/quality/duration/timing/severity/associated sxs/prior Treatment) Patient is a 40 y.o. female presenting with flank pain. The history is provided by the patient and medical records. No language interpreter was used.  Flank Pain Pertinent negatives include no abdominal pain, chest pain, coughing, diaphoresis, fatigue, fever, headaches, nausea, rash or vomiting.    Sue [name] is a 40 y.o. female  with a hx of asthma, HTN, ulcerative proctitis, nephrolithiasis presents to the Emergency Department complaining of gradual, persistent, progressively worsening left and right flank pain onset 3 days ago. Associated symptoms include dysuria.  Patient states the pain began initially in her left and then 24 hours ago migrated to her right. The pain in her left flank has resolved. Pt reports she saw her PCP on Thursday and was dx with renal colic 2/2 hemoglobin in her urine.  Patient has been taking Tylenol without relief. She reports his pain is exactly the same as her previous kidney stones. Record review shows no CT scan in the last several years. Nothing seems to make her symptoms better or worse. She denies fever, chills, headache, neck pain, chest pain, nausea, vomiting, diarrhea weakness, dizziness, syncope.  Past Medical History  Diagnosis Date  . Asthma     mild by PFT's 03/16/1998  . Sciatica 01/12/06  . History of abnormal Pap smear     s/p cryo( Dr. Wiliam Ke)  . Cervical lymphadenopathy     biopsy negativeMarvetta Gibbons)  . Normal vaginal delivery     x3  . Complete spontaneous abortion     x2  . Hypertension   . HSV (herpes simplex virus) anogenital infection   . GERD (gastroesophageal reflux disease)   . ULCERATIVE PROCTITIS 12/26/2007    Flex sig 12/12/07 Dr Buccini was c/w  ulcerative proctitis.Started on mesalamine suppositories and all sxs and pathology resolved on repeat flex sig 06/2008.   . Vaginal Pap smear, abnormal   . Kidney stone    Past Surgical History  Procedure Laterality Date  . Biopsy of lymph node in r neck  2007.  Marland Kitchen Leep     Family History  Problem Relation Age of Onset  . Ulcerative colitis Mother   . Hypertension Mother   . Diabetes Father   . Heart attack Father   . Hypertension Father   . Diabetes Sister   . Heart attack Maternal Grandmother    Social History  Substance Use Topics  . Smoking status: Never Smoker   . Smokeless tobacco: Never Used  . Alcohol Use: 0.0 oz/week    0 Standard drinks or equivalent per week     Comment: occasional beer and liquor on weekends   OB History    Gravida Para Term Preterm AB TAB SAB Ectopic Multiple Living   6 3 2 1 3  3   3      Review of Systems  Constitutional: Negative for fever, diaphoresis, appetite change, fatigue and unexpected weight change.  HENT: Negative for mouth sores.   Eyes: Negative for visual disturbance.  Respiratory: Negative for cough, chest tightness, shortness of breath and wheezing.   Cardiovascular: Negative for chest pain.  Gastrointestinal: Negative for nausea, vomiting, abdominal pain, diarrhea and constipation.  Endocrine: Negative for polydipsia, polyphagia and polyuria.  Genitourinary: Positive for dysuria and  flank pain. Negative for urgency, frequency and hematuria.  Musculoskeletal: Negative for back pain and neck stiffness.  Skin: Negative for rash.  Allergic/Immunologic: Negative for immunocompromised state.  Neurological: Negative for syncope, light-headedness and headaches.  Hematological: Does not bruise/bleed easily.  Psychiatric/Behavioral: Negative for sleep disturbance. The patient is not nervous/anxious.       Allergies  Sulfamethoxazole-trimethoprim and Amoxicillin  Home Medications   Prior to Admission medications   Medication  Sig Start Date End Date Taking? Authorizing Provider  acetaminophen (TYLENOL) 500 MG tablet Take 1,000 mg by mouth every 6 (six) hours as needed for mild pain, moderate pain or headache.   Yes Historical Provider, MD  albuterol (PROVENTIL HFA) 108 (90 BASE) MCG/ACT inhaler Inhale 2 puffs into the lungs every 6 (six) hours as needed for wheezing. 05/23/14 06/21/16 Yes Lorenda Hatchet, MD  amLODipine (NORVASC) 5 MG tablet Take 1 tablet (5 mg total) by mouth daily. 08/07/13  Yes Brock Bad, MD  cetirizine (ZYRTEC) 10 MG tablet Take 1 tablet (10 mg total) by mouth daily. Patient taking differently: Take 10 mg by mouth daily as needed for allergies.  11/02/13  Yes Denton Brick, MD  meloxicam (MOBIC) 7.5 MG tablet Take 7.5 mg by mouth daily as needed for pain.  07/24/15  Yes Historical Provider, MD  Multiple Vitamin (MULITIVITAMIN WITH MINERALS) TABS Take 1 tablet by mouth daily.   Yes Historical Provider, MD  pantoprazole (PROTONIX) 40 MG tablet Take 40 mg by mouth daily. 07/24/15  Yes Historical Provider, MD  oxyCODONE-acetaminophen (PERCOCET/ROXICET) 5-325 MG tablet Take 2 tablets by mouth every 4 (four) hours as needed for severe pain. 07/28/15   Katerina Zurn, PA-C   BP 119/81 mmHg  Pulse 90  Temp(Src) 98.7 F (37.1 C) (Oral)  Resp 19  SpO2 100%  LMP 07/01/2015 Physical Exam  Constitutional: She appears well-developed and well-nourished. No distress.  HENT:  Head: Normocephalic and atraumatic.  Mouth/Throat: Oropharynx is clear and moist. No oropharyngeal exudate.  Cardiovascular: Normal rate, regular rhythm, normal heart sounds and intact distal pulses.   Pulmonary/Chest: Effort normal and breath sounds normal. No respiratory distress. She has no wheezes.  Abdominal: Soft. Bowel sounds are normal. She exhibits no distension and no mass. There is no tenderness. There is CVA tenderness (right). There is no rebound and no guarding.  Musculoskeletal: Normal range of motion. She exhibits no  edema.  Neurological: She is alert.  Skin: Skin is warm and dry. No rash noted. She is not diaphoretic.  Psychiatric: She has a normal mood and affect.  Nursing note and vitals reviewed.   ED Course  Procedures (including critical care time) Labs Review Labs Reviewed  URINALYSIS, ROUTINE W REFLEX MICROSCOPIC (NOT AT [facility]) - Abnormal; Notable for the following:    Hgb urine dipstick TRACE (*)    All other components within normal limits  URINE MICROSCOPIC-ADD ON - Abnormal; Notable for the following:    Squamous Epithelial / LPF 0-5 (*)    Bacteria, UA FEW (*)    All other components within normal limits  BASIC METABOLIC PANEL - Abnormal; Notable for the following:    Glucose, Bld 108 (*)    Creatinine, Ser 1.13 (*)    All other components within normal limits  CBC  I-STAT BETA HCG BLOOD, ED ([facility], [facility], [facility] ONLY)    Imaging Review Ct Renal Stone Study  07/27/2015  CLINICAL DATA:  Left flank pain for the past few days. Blood in the urine. Diagnosed with  kidney stone by primary care physician. Left flank pain has. But now having pain in the right flank. History kidney stones. EXAM: CT ABDOMEN AND PELVIS WITHOUT CONTRAST TECHNIQUE: Multidetector CT imaging of the abdomen and pelvis was performed following the standard protocol without IV contrast. COMPARISON:  08/02/2011 FINDINGS: Dependent atelectasis. Interstitial changes in the lung bases may be due to edema or fibrosis. Small esophageal hiatal hernia. Kidneys are symmetrical in size and shape. No hydronephrosis or hydroureter. No renal, ureteral, or bladder stones. Stones seen on the previous study in the upper pole left kidney is no longer present. No bladder wall thickening. Diffuse fatty infiltration of the liver. The unenhanced appearance of the gallbladder, spleen, pancreas, adrenal glands, abdominal aorta, inferior vena cava, and retroperitoneal lymph nodes is unremarkable. Stomach, small bowel, and colon are not abnormally distended.  No free air or free fluid in the abdomen. Small periumbilical hernia containing fat. Pelvis: A the appendix is not identified. Uterus is retroflexed and enlarged, likely due to fibroids. No abnormal adnexal masses. No free or loculated pelvic fluid collections. No pelvic lymphadenopathy. No destructive bone lesions. IMPRESSION: No renal or ureteral stone or obstruction. Diffuse fatty infiltration of the liver. Small esophageal hiatal hernia. Probable uterine fibroids. Electronically Signed   By: Burman Nieves M.D.   On: 07/27/2015 23:10   I have personally reviewed and evaluated these images and lab results as part of my medical decision-making.    MDM   Final diagnoses:  Right flank pain  Hematuria  Elevated serum creatinine   Sue [name] presents with right flank pain and CVA tenderness.  Labs are generally reassuring however she does have an increase in her serum creatinine to 1.13 from 0.9 previously. Patient has been given fluids. Her CT scan shows no renal or ureteral stones or obstruction. No hydronephrosis.  Urinalysis shows no evidence of urinary tract infection. Nitrite and leukocyte negative. No white blood cells.  It does have trace hemoglobin.  Patient's pain is well controlled at this time. Discussed these findings with her including mild AKI.  Discussed need for close follow-up in 48 hours for repeat blood work. Also discussed reasons to return to the emergency department including fever, chills, vomiting or confusion.  We'll discharge home with pain medications.  She is without evidence of SIRS or sepsis.  No fever, hypotension or tachycardia.  Tolerating by mouth here in the emergency department.  Patient has been given a copy of her lab results to provide to her primary care physician when she follows up on Monday.  Patient and significant other are comfortable and agreeable with plan for discharge. All questions have been answered.  BP 119/81 mmHg  Pulse 90  Temp(Src) 98.7  F (37.1 C) (Oral)  Resp 19  SpO2 100%  LMP 07/01/2015    Dahlia Client Lori Popowski, PA-C 07/28/15 0020  Lavera Guise, MD 07/29/15 2045

## 2015-07-27 NOTE — ED Notes (Signed)
Pt reports L flank pain for the past few days. Went to PCP who diagnosed her with a kidney stone due to blood in urine. L flank pain has stopped at this point, but now having same pain on R flank.

## 2015-07-28 ENCOUNTER — Emergency Department (HOSPITAL_COMMUNITY)
Admission: EM | Admit: 2015-07-28 | Discharge: 2015-07-28 | Disposition: A | Discharge status: Home / Self Care | Payer: Medicaid Other | Source: Home / Self Care | Attending: Emergency Medicine | Admitting: Emergency Medicine

## 2015-07-28 ENCOUNTER — Encounter (HOSPITAL_COMMUNITY): Payer: Self-pay | Admitting: Family Medicine

## 2015-07-28 DIAGNOSIS — Z880 Allergy status to penicillin: Secondary | ICD-10-CM

## 2015-07-28 DIAGNOSIS — Z8739 Personal history of other diseases of the musculoskeletal system and connective tissue: Secondary | ICD-10-CM

## 2015-07-28 DIAGNOSIS — K219 Gastro-esophageal reflux disease without esophagitis: Secondary | ICD-10-CM

## 2015-07-28 DIAGNOSIS — Z87442 Personal history of urinary calculi: Secondary | ICD-10-CM | POA: Insufficient documentation

## 2015-07-28 DIAGNOSIS — J45909 Unspecified asthma, uncomplicated: Secondary | ICD-10-CM

## 2015-07-28 DIAGNOSIS — I10 Essential (primary) hypertension: Secondary | ICD-10-CM

## 2015-07-28 DIAGNOSIS — Z8619 Personal history of other infectious and parasitic diseases: Secondary | ICD-10-CM | POA: Insufficient documentation

## 2015-07-28 DIAGNOSIS — R109 Unspecified abdominal pain: Secondary | ICD-10-CM | POA: Insufficient documentation

## 2015-07-28 DIAGNOSIS — Z79899 Other long term (current) drug therapy: Secondary | ICD-10-CM | POA: Insufficient documentation

## 2015-07-28 LAB — I-STAT CHEM 8, ED
BUN: 10 mg/dL (ref 6–20)
Calcium, Ion: 1.15 mmol/L (ref 1.12–1.23)
Chloride: 107 mmol/L (ref 101–111)
Creatinine, Ser: 1 mg/dL (ref 0.44–1.00)
Glucose, Bld: 94 mg/dL (ref 65–99)
HCT: 42 % (ref 36.0–46.0)
Hemoglobin: 14.3 g/dL (ref 12.0–15.0)
Potassium: 4.3 mmol/L (ref 3.5–5.1)
Sodium: 143 mmol/L (ref 135–145)
TCO2: 23 mmol/L (ref 0–100)

## 2015-07-28 MED ORDER — SODIUM CHLORIDE 0.9 % IV BOLUS (SEPSIS)
1000.0000 mL | Freq: Once | INTRAVENOUS | Status: AC
  Administered 2015-07-28: 1000 mL via INTRAVENOUS

## 2015-07-28 MED ORDER — KETOROLAC TROMETHAMINE 30 MG/ML IJ SOLN
30.0000 mg | Freq: Once | INTRAMUSCULAR | Status: AC
  Administered 2015-07-28: 30 mg via INTRAVENOUS
  Filled 2015-07-28: qty 1

## 2015-07-28 MED ORDER — OXYCODONE-ACETAMINOPHEN 5-325 MG PO TABS: 2.0000 | ORAL_TABLET | ORAL | Status: DC | PRN

## 2015-07-28 NOTE — ED Provider Notes (Signed)
CSN: [id number]     Arrival date & time 07/28/15  1246 History   First MD Initiated Contact with Patient 07/28/15 1317     Chief Complaint  Patient presents with  . Back Pain     (Consider location/radiation/quality/duration/timing/severity/associated sxs/prior Treatment) HPI Sue [name] is a 40 y.o. female with PMH significant for asthma, HTN, ulcerative proctitis, and nephrolithiasis who was seen in the ED last night for gradual, constant, worsening right flank pain with associated sxs of dysuria and discharged home with percocet after negative non-contrast CT abdomen/pelvis, mild elevation in serum cr from 0.9 to 1.13, and UA with trace hematuria.  Patient saw PCP on Thursday and dxed with nephrolithiasis after hgb in urine.  She presents today with continued non-radiating R flank pain without acute changes in her symptoms.  She reports she has not taken any medications including prescribed percocet. She also states that she has been drinking very little water and that she has only peed once since being discharged at 1:30 AM.  She is very concerned about her kidney function.   Denies fever, chills, N/V/D, abdominal pain, vaginal complaints, numbness, or weakness.   Past Medical History  Diagnosis Date  . Asthma     mild by PFT's 03/16/1998  . Sciatica 01/12/06  . History of abnormal Pap smear     s/p cryo( Dr. Wiliam Ke)  . Cervical lymphadenopathy     biopsy negativeMarvetta Gibbons)  . Normal vaginal delivery     x3  . Complete spontaneous abortion     x2  . Hypertension   . HSV (herpes simplex virus) anogenital infection   . GERD (gastroesophageal reflux disease)   . 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   . Vaginal Pap smear, abnormal   . Kidney stone    Past Surgical History  Procedure Laterality Date  . Biopsy of lymph node in r  neck  2007.  Marland Kitchen Leep     Family History  Problem Relation Age of Onset  . Ulcerative colitis Mother   . Hypertension Mother   . Diabetes Father   . Heart attack Father   . Hypertension Father   . Diabetes Sister   . Heart attack Maternal Grandmother    Social History  Substance Use Topics  . Smoking status: Never Smoker   . Smokeless tobacco: Never Used  . Alcohol Use: 0.0 oz/week    0 Standard drinks or equivalent per week     Comment: occasional beer and liquor on weekends   OB History    Gravida Para Term Preterm AB TAB SAB Ectopic Multiple Living   6 3 2 1 3  3   3      Review of Systems All other systems negative unless otherwise stated in HPI    Allergies  Sulfamethoxazole-trimethoprim and Amoxicillin  Home Medications   Prior to Admission medications   Medication Sig Start Date End Date Taking? Authorizing Provider  acetaminophen (TYLENOL) 500 MG tablet Take 1,000 mg by mouth every 6 (six) hours as needed for mild pain, moderate pain or headache.    Historical Provider, MD  albuterol (PROVENTIL HFA) 108 (90 BASE) MCG/ACT inhaler Inhale 2 puffs into the lungs every 6 (six) hours as needed for wheezing. 05/23/14 06/21/16  06/23/16, MD  amLODipine (NORVASC) 5 MG tablet Take 1 tablet (5 mg total) by  mouth daily. 08/07/13   Brock Bad, MD  cetirizine (ZYRTEC) 10 MG tablet Take 1 tablet (10 mg total) by mouth daily. Patient taking differently: Take 10 mg by mouth daily as needed for allergies.  11/02/13   Denton Brick, MD  meloxicam (MOBIC) 7.5 MG tablet Take 7.5 mg by mouth daily as needed for pain.  07/24/15   Historical Provider, MD  Multiple Vitamin (MULITIVITAMIN WITH MINERALS) TABS Take 1 tablet by mouth daily.    Historical Provider, MD  oxyCODONE-acetaminophen (PERCOCET/ROXICET) 5-325 MG tablet Take 2 tablets by mouth every 4 (four) hours as needed for severe pain. 07/28/15   Hannah Muthersbaugh, PA-C  pantoprazole (PROTONIX) 40 MG tablet Take 40 mg by  mouth daily. 07/24/15   Historical Provider, MD   BP 122/89 mmHg  Pulse 92  Temp(Src) 99.1 F (37.3 C) (Oral)  Resp 16  Ht 5\' 4"  (1.626 m)  Wt 81.194 kg  BMI 30.71 kg/m2  SpO2 98%  LMP 07/01/2015 Physical Exam  Constitutional: She is oriented to person, place, and time. She appears well-developed and well-nourished.  Non-toxic appearance. She does not have a sickly appearance. She does not appear ill.  Patient tearful.  HENT:  Head: Normocephalic and atraumatic.  Mouth/Throat: Oropharynx is clear and moist.  Eyes: Conjunctivae are normal. Pupils are equal, round, and reactive to light.  Neck: Normal range of motion. Neck supple.  Cardiovascular: Normal rate and regular rhythm.   Pulmonary/Chest: Effort normal and breath sounds normal. No accessory muscle usage or stridor. No respiratory distress. She has no wheezes. She has no rhonchi. She has no rales.  Abdominal: Soft. Bowel sounds are normal. She exhibits no distension. There is no tenderness. There is CVA tenderness (Right). There is no rebound and no guarding.  Musculoskeletal: Normal range of motion.  No c/t/l/s midline tenderness.  No step offs or crepitus.  Lymphadenopathy:    She has no cervical adenopathy.  Neurological: She is alert and oriented to person, place, and time.  Speech clear without dysarthria.  Skin: Skin is warm and dry.  Psychiatric: She has a normal mood and affect. Her behavior is normal.    ED Course  Procedures (including critical care time) Labs Review Labs Reviewed  I-STAT CHEM 8, ED    Imaging Review Ct Renal Stone Study  07/27/2015  CLINICAL DATA:  Left flank pain for the past few days. Blood in the urine. Diagnosed with kidney stone by primary care physician. Left flank pain has. But now having pain in the right flank. History kidney stones. EXAM: CT ABDOMEN AND PELVIS WITHOUT CONTRAST TECHNIQUE: Multidetector CT imaging of the abdomen and pelvis was performed following the standard protocol  without IV contrast. COMPARISON:  08/02/2011 FINDINGS: Dependent atelectasis. Interstitial changes in the lung bases may be due to edema or fibrosis. Small esophageal hiatal hernia. Kidneys are symmetrical in size and shape. No hydronephrosis or hydroureter. No renal, ureteral, or bladder stones. Stones seen on the previous study in the upper pole left kidney is no longer present. No bladder wall thickening. Diffuse fatty infiltration of the liver. The unenhanced appearance of the gallbladder, spleen, pancreas, adrenal glands, abdominal aorta, inferior vena cava, and retroperitoneal lymph nodes is unremarkable. Stomach, small bowel, and colon are not abnormally distended. No free air or free fluid in the abdomen. Small periumbilical hernia containing fat. Pelvis: A the appendix is not identified. Uterus is retroflexed and enlarged, likely due to fibroids. No abnormal adnexal masses. No free or loculated pelvic fluid  collections. No pelvic lymphadenopathy. No destructive bone lesions. IMPRESSION: No renal or ureteral stone or obstruction. Diffuse fatty infiltration of the liver. Small esophageal hiatal hernia. Probable uterine fibroids. Electronically Signed   By: Burman Nieves M.D.   On: 07/27/2015 23:10   I have personally reviewed and evaluated these images and lab results as part of my medical decision-making.   EKG Interpretation None      MDM   Final diagnoses:  Right flank pain   Patient presents with right flank pain. Patient was seen at [facility] last night for the same symptoms. Renal CT performed and negative for urolithiasis or other acute abnormality. Lab work showed slight bump in creatinine from 0.9 to 1.13. UA showed no signs of infection. Pain treated with Toradol and fentanyl. Patient was discharged with Percocet and instructed to call her PCP on Monday. She presents today for continued right flank pain. Patient did not take anything for her pain including discharge prescription  of Percocet. She states that she has only urinated once since being discharged at 1:30 AM. She also reports very little water intake. No fever, chills, nausea, vomiting, or vaginal complaints.  Will give IVF and obtain i-stat chem 8.  No indication for further imaging or lab work at this time given no change in symptoms.  Symptoms likely related to decreased fluid intake.  Doubt pyelonephritis, nephrolithiasis, or other acute intra-abdominal pathology. Creatinine today is 1. This is normal. Again, no indication for further lab work or imaging at this time since there is been no acute changes in patient's symptoms and she had a normal workup last night. Patient instructed to take Percocet that was prescribed at her previous visit. Encouraged adequate fluid intake. Patient advised to follow-up with her primary care doctor. Discussed return precautions. Patient agrees and acknowledges the above plan for discharge.      Cheri Fowler, PA-C 07/28/15 1513  Margarita Grizzle, MD 08/01/15 1352

## 2015-07-28 NOTE — ED Notes (Signed)
Pt here for continued back pain over the past few days. sts has been on both sides right and left flank. sts blood in urine. sts not peeing very much and concerned. Was seen at [facility] last night for same and told creatinine was elevated.

## 2015-07-28 NOTE — Discharge Instructions (Signed)
1. Medications: percocet, usual home medications 2. Treatment: rest, drink plenty of fluids,  3. Follow Up: Please followup with your primary doctor in 2 days for discussion of your diagnoses and further evaluation after today's visit; if you do not have a primary care doctor use the resource guide provided to find one; Please return to the ER for fevers, vomiting, worsening pain or confusion.

## 2015-07-28 NOTE — Discharge Instructions (Signed)
Your kidney function is normal today.  Take Percocet from your previous visit as needed for pain.  You may take 800 mg ibuprofen for pain as well.  Make sure you are drinking plenty of water (2 liters).  Follow up with your primary care doctor tomorrow.  Return to the ED if you experience worsening pain, fever, chills, nausea, vomiting, or any other worsening symptoms.   Flank Pain Flank pain refers to pain that is located on the side of the body between the upper abdomen and the back. The pain may occur over a short period of time (acute) or may be long-term or reoccurring (chronic). It may be mild or severe. Flank pain can be caused by many things. CAUSES  Some of the more common causes of flank pain include:  Muscle strains.   Muscle spasms.   A disease of your spine (vertebral disk disease).   A lung infection (pneumonia).   Fluid around your lungs (pulmonary edema).   A kidney infection.   Kidney stones.   A very painful skin rash caused by the chickenpox virus (shingles).   Gallbladder disease.  HOME CARE INSTRUCTIONS  Home care will depend on the cause of your pain. In general,  Rest as directed by your caregiver.  Drink enough fluids to keep your urine clear or pale yellow.  Only take over-the-counter or prescription medicines as directed by your caregiver. Some medicines may help relieve the pain.  Tell your caregiver about any changes in your pain.  Follow up with your caregiver as directed. SEEK IMMEDIATE MEDICAL CARE IF:   Your pain is not controlled with medicine.   You have new or worsening symptoms.  Your pain increases.   You have abdominal pain.   You have shortness of breath.   You have persistent nausea or vomiting.   You have swelling in your abdomen.   You feel faint or pass out.   You have blood in your urine.  You have a fever or persistent symptoms for more than 2-3 days.  You have a fever and your symptoms suddenly get  worse. MAKE SURE YOU:   Understand these instructions.  Will watch your condition.  Will get help right away if you are not doing well or get worse.   This information is not intended to replace advice given to you by your health care provider. Make sure you discuss any questions you have with your health care provider.   Document Released: 04/16/2005 Document Revised: 11/18/2011 Document Reviewed: 10/08/2011 Elsevier Interactive Patient Education [employer]

## 2015-07-29 NOTE — Progress Notes (Signed)
Internal Medicine Clinic Attending  Case discussed with Dr. Jones at the time of the visit.  We reviewed the resident's history and exam and pertinent patient test results.  I agree with the assessment, diagnosis, and plan of care documented in the resident's note.  

## 2015-07-31 LAB — HLA-B27 ANTIGEN: HLA B27: NEGATIVE

## 2015-08-01 ENCOUNTER — Telehealth: Payer: Self-pay | Admitting: Internal Medicine

## 2015-08-01 ENCOUNTER — Ambulatory Visit (INDEPENDENT_AMBULATORY_CARE_PROVIDER_SITE_OTHER): Payer: Medicaid Other | Admitting: Internal Medicine

## 2015-08-01 VITALS — BP 127/80 | HR 73 | Temp 98.6°F | Ht 64.0 in | Wt 180.3 lb

## 2015-08-01 DIAGNOSIS — R319 Hematuria, unspecified: Secondary | ICD-10-CM | POA: Diagnosis not present

## 2015-08-01 DIAGNOSIS — M533 Sacrococcygeal disorders, not elsewhere classified: Secondary | ICD-10-CM

## 2015-08-01 DIAGNOSIS — R300 Dysuria: Secondary | ICD-10-CM

## 2015-08-01 LAB — POCT URINALYSIS DIPSTICK
Bilirubin, UA: NEGATIVE
Glucose, UA: NEGATIVE
Ketones, UA: NEGATIVE
Nitrite, UA: POSITIVE
Protein, UA: 100
Spec Grav, UA: 1.015
Urobilinogen, UA: 0.2
pH, UA: 6

## 2015-08-01 NOTE — Telephone Encounter (Signed)
APT. REMINDER CALL, LMTCB °

## 2015-08-01 NOTE — Progress Notes (Signed)
Subjective:   Patient ID: Sue [name] female   DOB: 1975-06-21 40 y.o.   MRN: [id number]  HPI: Sue [name] is a 40 y.o. female w/ PMHx of HTN, Asthma, GERD, and ulcerative Proctitis, presents to the clinic for follow up visit for flank pain. Patient says she went to the ED recently for continued flank pain after her visit in the clinic. She had CT scan at that time which did not show a stone. She received IVF and was sent home. Today, her pain has resolved, no further dysuria. Urine still showing a small amount of blood. Likely passed a stone.   Also following up regarding HLA-B27 status, which was negative. 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 Also asked about other possibilities, ie: Behcet's, etc. Patient does state she has had genital lesions in the past, was told she had herpes. No oral aphthous ulcers that she knows of. Patient does mention family h/o IBD in her mother who she states has Crohn's Disease.    Current Outpatient Prescriptions  Medication Sig Dispense Refill  . acetaminophen (TYLENOL) 500 MG tablet Take 1,000 mg by mouth every 6 (six) hours as needed for mild pain, moderate pain or headache.    . albuterol (PROVENTIL HFA) 108 (90 BASE) MCG/ACT inhaler Inhale 2 puffs into the lungs every 6 (six) hours as needed for wheezing. 1 Inhaler 6  . amLODipine (NORVASC) 5 MG tablet Take 1 tablet (5 mg total) by mouth daily. 30 tablet 11  . cetirizine (ZYRTEC) 10 MG tablet Take 1 tablet (10 mg total) by mouth daily. (Patient taking differently: Take 10 mg by mouth daily as needed for allergies. ) 30 tablet 6  . meloxicam (MOBIC) 7.5 MG tablet Take 7.5 mg by mouth daily as needed for pain.   0  . Multiple Vitamin (MULITIVITAMIN WITH MINERALS) TABS Take 1 tablet by mouth daily.    Marland Kitchen oxyCODONE-acetaminophen (PERCOCET/ROXICET) 5-325 MG tablet Take 2 tablets by mouth every 4 (four) hours as needed for severe pain. 6 tablet 0  . pantoprazole (PROTONIX) 40  MG tablet Take 40 mg by mouth daily.  1  . [DISCONTINUED] levonorgestrel-ethinyl estradiol (SEASONALE,INTROVALE,JOLESSA) 0.15-0.03 MG tablet Take 1 tablet by mouth daily. (Patient not taking: Reported on 07/27/2015) 1 Package 3  . [DISCONTINUED] loratadine (CLARITIN) 10 MG tablet Take 1 tablet (10 mg total) by mouth daily. (Patient not taking: Reported on 07/27/2015) 30 tablet 0   No current facility-administered medications for this visit.   Review of Systems  General: Denies fever, diaphoresis, appetite change, and fatigue.  Respiratory: Denies SOB, cough, and wheezing.   Cardiovascular: Denies chest pain and palpitations.  Gastrointestinal: Denies nausea, vomiting, abdominal pain, and diarrhea Musculoskeletal: Denies myalgias, arthralgias, back pain, and gait problem.  Neurological: Denies dizziness, syncope, weakness, lightheadedness, and headaches.  Psychiatric/Behavioral: Denies mood changes, sleep disturbance, and agitation.   Objective:   Physical Exam: Filed Vitals:   08/01/15 1509  BP: 127/80  Pulse: 73  Temp: 98.6 F (37 C)  TempSrc: Oral  Height: '5\' 4"'$  (1.626 m)  Weight: 180 lb 4.8 oz (81.784 kg)  SpO2: 100%     General: Alert, cooperative, NAD. HEENT: PERRL, EOMI. Moist mucus membranes Neck: Full range of motion without pain, supple, no lymphadenopathy or carotid bruits Lungs: Clear to ascultation bilaterally, normal work of respiration, no wheezes, rales, rhonchi Heart: RRR, no murmurs, gallops, or rubs Abdomen: Soft, non-tender, non-distended, BS + Extremities: No cyanosis, clubbing, or edema Neurologic: Alert &  oriented X3, cranial nerves II-XII intact, strength grossly intact, sensation intact to light touch    Assessment & Plan:   Please see problem based assessment and plan.

## 2015-08-01 NOTE — Patient Instructions (Addendum)
1. Please follow up in 6 weeks, after you see the rheumatologist.   2. Please take all medications as previously prescribed.  Please stay hydrated!!  3. If you have worsening of your symptoms or new symptoms arise, please call the clinic [phone]), or go to the ER immediately if symptoms are severe.  You have done a great job in taking all your medications. Please continue to do this.

## 2015-08-02 LAB — URINALYSIS, ROUTINE W REFLEX MICROSCOPIC
Bilirubin, UA: NEGATIVE
Glucose, UA: NEGATIVE
Ketones, UA: NEGATIVE
Leukocytes, UA: NEGATIVE
Nitrite, UA: NEGATIVE
Protein, UA: NEGATIVE
Specific Gravity, UA: >=1.03 — AB (ref 1.005–1.030)
Urobilinogen, Ur: 0.2 mg/dL (ref 0.2–1.0)
pH, UA: 6 (ref 5.0–7.5)

## 2015-08-02 LAB — MICROSCOPIC EXAMINATION: Casts: NONE SEEN /lpf

## 2015-08-02 NOTE — Assessment & Plan Note (Signed)
HLA B27 negative. Still high suspicion for autoimmune condition. 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 No previous aphthous ulcers.  -Referral to rheumatology pending.

## 2015-08-02 NOTE — Assessment & Plan Note (Signed)
Flank pain and dysuria has resolved. Patient was seen in the ED for pain previously, had CT scan which was negative for stone. Very well had recently passed a stone and continued to have residual pain related to this. Checked UA again today, trace blood still present. Given absence of symptoms, no further workup at this time. ] -Would consider repeat UA in the future to ensure patient does not have chronic hematuria which would require further workup, however, recent CT does not suggest renal/bladder lesion.

## 2015-08-02 NOTE — Progress Notes (Signed)
Internal Medicine Clinic Attending  Case discussed with Dr. Jones at the time of the visit.  We reviewed the resident's history and exam and pertinent patient test results.  I agree with the assessment, diagnosis, and plan of care documented in the resident's note.  

## 2015-09-04 ENCOUNTER — Other Ambulatory Visit: Payer: Self-pay | Admitting: Obstetrics

## 2015-09-06 ENCOUNTER — Ambulatory Visit: Payer: Medicaid Other | Admitting: Internal Medicine

## 2015-09-06 ENCOUNTER — Encounter: Payer: Self-pay | Admitting: Internal Medicine

## 2015-09-07 ENCOUNTER — Other Ambulatory Visit: Payer: Self-pay | Admitting: Obstetrics

## 2015-09-26 ENCOUNTER — Other Ambulatory Visit (HOSPITAL_COMMUNITY): Payer: Self-pay | Admitting: Advanced Practice Midwife

## 2015-09-26 ENCOUNTER — Encounter (HOSPITAL_COMMUNITY): Payer: Self-pay | Admitting: *Deleted

## 2015-09-26 ENCOUNTER — Inpatient Hospital Stay (HOSPITAL_COMMUNITY)
Admission: AD | Admit: 2015-09-26 | Discharge: 2015-09-26 | Disposition: A | Discharge status: Home / Self Care | Payer: Medicaid Other | Source: Ambulatory Visit | Attending: Family Medicine | Admitting: Family Medicine

## 2015-09-26 DIAGNOSIS — R42 Dizziness and giddiness: Secondary | ICD-10-CM | POA: Insufficient documentation

## 2015-09-26 DIAGNOSIS — R102 Pelvic and perineal pain: Secondary | ICD-10-CM

## 2015-09-26 DIAGNOSIS — R509 Fever, unspecified: Secondary | ICD-10-CM | POA: Diagnosis not present

## 2015-09-26 DIAGNOSIS — Z880 Allergy status to penicillin: Secondary | ICD-10-CM | POA: Diagnosis not present

## 2015-09-26 DIAGNOSIS — I10 Essential (primary) hypertension: Secondary | ICD-10-CM | POA: Diagnosis not present

## 2015-09-26 DIAGNOSIS — R319 Hematuria, unspecified: Secondary | ICD-10-CM | POA: Diagnosis not present

## 2015-09-26 DIAGNOSIS — Z87442 Personal history of urinary calculi: Secondary | ICD-10-CM | POA: Insufficient documentation

## 2015-09-26 DIAGNOSIS — Z79899 Other long term (current) drug therapy: Secondary | ICD-10-CM | POA: Insufficient documentation

## 2015-09-26 DIAGNOSIS — R1030 Lower abdominal pain, unspecified: Secondary | ICD-10-CM | POA: Diagnosis present

## 2015-09-26 DIAGNOSIS — Z882 Allergy status to sulfonamides status: Secondary | ICD-10-CM | POA: Insufficient documentation

## 2015-09-26 DIAGNOSIS — J45909 Unspecified asthma, uncomplicated: Secondary | ICD-10-CM | POA: Diagnosis not present

## 2015-09-26 DIAGNOSIS — K219 Gastro-esophageal reflux disease without esophagitis: Secondary | ICD-10-CM | POA: Diagnosis not present

## 2015-09-26 LAB — URINE MICROSCOPIC-ADD ON
Bacteria, UA: NONE SEEN
RBC / HPF: NONE SEEN RBC/hpf (ref 0–5)

## 2015-09-26 LAB — URINALYSIS, ROUTINE W REFLEX MICROSCOPIC
Bilirubin Urine: NEGATIVE
Glucose, UA: NEGATIVE mg/dL
Ketones, ur: NEGATIVE mg/dL
Leukocytes, UA: NEGATIVE
Nitrite: NEGATIVE
Protein, ur: NEGATIVE mg/dL
Specific Gravity, Urine: 1.025 (ref 1.005–1.030)
pH: 5.5 (ref 5.0–8.0)

## 2015-09-26 LAB — CBC WITH DIFFERENTIAL/PLATELET
Basophils Absolute: 0 10*3/uL (ref 0.0–0.1)
Basophils Relative: 0 %
Eosinophils Absolute: 0.3 10*3/uL (ref 0.0–0.7)
Eosinophils Relative: 4 %
HCT: 37 % (ref 36.0–46.0)
Hemoglobin: 12.7 g/dL (ref 12.0–15.0)
Lymphocytes Relative: 32 %
Lymphs Abs: 1.9 10*3/uL (ref 0.7–4.0)
MCH: 29.1 pg (ref 26.0–34.0)
MCHC: 34.3 g/dL (ref 30.0–36.0)
MCV: 84.7 fL (ref 78.0–100.0)
Monocytes Absolute: 0.2 10*3/uL (ref 0.1–1.0)
Monocytes Relative: 3 %
Neutro Abs: 3.5 10*3/uL (ref 1.7–7.7)
Neutrophils Relative %: 61 %
Platelets: 304 10*3/uL (ref 150–400)
RBC: 4.37 MIL/uL (ref 3.87–5.11)
RDW: 15.5 % (ref 11.5–15.5)
WBC: 5.9 10*3/uL (ref 4.0–10.5)

## 2015-09-26 LAB — POCT PREGNANCY, URINE: Preg Test, Ur: NEGATIVE

## 2015-09-26 MED ORDER — KETOROLAC TROMETHAMINE 60 MG/2ML IM SOLN
60.0000 mg | Freq: Once | INTRAMUSCULAR | Status: AC
  Administered 2015-09-26: 60 mg via INTRAMUSCULAR
  Filled 2015-09-26: qty 2

## 2015-09-26 MED ORDER — HYDROMORPHONE HCL 2 MG PO TABS: 2.0000 mg | ORAL_TABLET | ORAL | Status: DC | PRN

## 2015-09-26 NOTE — Discharge Instructions (Signed)
Hematuria, Adult Hematuria is blood in your urine. It can be caused by a bladder infection, kidney infection, prostate infection, kidney stone, or cancer of your urinary tract. Infections can usually be treated with medicine, and a kidney stone usually will pass through your urine. If neither of these is the cause of your hematuria, further workup to find out the reason may be needed. It is very important that you tell your health care provider about any blood you see in your urine, even if the blood stops without treatment or happens without causing pain. Blood in your urine that happens and then stops and then happens again can be a symptom of a very serious condition. Also, pain is not a symptom in the initial stages of many urinary cancers. HOME CARE INSTRUCTIONS   Drink lots of fluid, 3-4 quarts a day. If you have been diagnosed with an infection, cranberry juice is especially recommended, in addition to large amounts of water.  Avoid caffeine, tea, and carbonated beverages because they tend to irritate the bladder.  Avoid alcohol because it may irritate the prostate.  Take all medicines as directed by your health care provider.  If you were prescribed an antibiotic medicine, finish it all even if you start to feel better.  If you have been diagnosed with a kidney stone, follow your health care provider's instructions regarding straining your urine to catch the stone.  Empty your bladder often. Avoid holding urine for long periods of time.  After a bowel movement, women should cleanse front to back. Use each tissue only once.  Empty your bladder before and after sexual intercourse if you are a female. SEEK MEDICAL CARE IF:  You develop back pain.  You have a fever.  You have a feeling of sickness in your stomach (nausea) or vomiting.  Your symptoms are not better in 3 days. Return sooner if you are getting worse. SEEK IMMEDIATE MEDICAL CARE IF:   You develop severe vomiting and  are unable to keep the medicine down.  You develop severe back or abdominal pain despite taking your medicines.  You begin passing a large amount of blood or clots in your urine.  You feel extremely weak or faint, or you pass out. MAKE SURE YOU:   Understand these instructions.  Will watch your condition.  Will get help right away if you are not doing well or get worse.   This information is not intended to replace advice given to you by your health care provider. Make sure you discuss any questions you have with your health care provider.   Document Released: 02/23/2005 Document Revised: 03/16/2014 Document Reviewed: 10/24/2012 Elsevier Interactive Patient Education 2016 [employer] Pelvic Pain, Female Female pelvic pain can be caused by many different things and start from a variety of places. Pelvic pain refers to pain that is located in the lower half of the abdomen and between your hips. The pain may occur over a short period of time (acute) or may be reoccurring (chronic). The cause of pelvic pain may be related to disorders affecting the female reproductive organs (gynecologic), but it may also be related to the bladder, kidney stones, an intestinal complication, or muscle or skeletal problems. Getting help right away for pelvic pain is important, especially if there has been severe, sharp, or a sudden onset of unusual pain. It is also important to get help right away because some types of pelvic pain can be life threatening.  CAUSES  Below are only some  of the causes of pelvic pain. The causes of pelvic pain can be in one of several categories.   Gynecologic.  Pelvic inflammatory disease.  Sexually transmitted infection.  Ovarian cyst or a twisted ovarian ligament (ovarian torsion).  Uterine lining that grows outside the uterus (endometriosis).  Fibroids, cysts, or tumors.  Ovulation.  Pregnancy.  Pregnancy that occurs outside the uterus (ectopic  pregnancy).  Miscarriage.  Labor.  Abruption of the placenta or ruptured uterus.  Infection.  Uterine infection (endometritis).  Bladder infection.  Diverticulitis.  Miscarriage related to a uterine infection (septic abortion).  Bladder.  Inflammation of the bladder (cystitis).  Kidney stone(s).  Gastrointestinal.  Constipation.  Diverticulitis.  Neurologic.  Trauma.  Feeling pelvic pain because of mental or emotional causes (psychosomatic).  Cancers of the bowel or pelvis. EVALUATION  Your caregiver will want to take a careful history of your concerns. This includes recent changes in your health, a careful gynecologic history of your periods (menses), and a sexual history. Obtaining your family history and medical history is also important. Your caregiver may suggest a pelvic exam. A pelvic exam will help identify the location and severity of the pain. It also helps in the evaluation of which organ system may be involved. In order to identify the cause of the pelvic pain and be properly treated, your caregiver may order tests. These tests may include:   A pregnancy test.  Pelvic ultrasonography.  An X-ray exam of the abdomen.  A urinalysis or evaluation of vaginal discharge.  Blood tests. HOME CARE INSTRUCTIONS   Only take over-the-counter or prescription medicines for pain, discomfort, or fever as directed by your caregiver.   Rest as directed by your caregiver.   Eat a balanced diet.   Drink enough fluids to make your urine clear or pale yellow, or as directed.   Avoid sexual intercourse if it causes pain.   Apply warm or cold compresses to the lower abdomen depending on which one helps the pain.   Avoid stressful situations.   Keep a journal of your pelvic pain. Write down when it started, where the pain is located, and if there are things that seem to be associated with the pain, such as food or your menstrual cycle.  Follow up with your  caregiver as directed.  SEEK MEDICAL CARE IF:  Your medicine does not help your pain.  You have abnormal vaginal discharge. SEEK IMMEDIATE MEDICAL CARE IF:   You have heavy bleeding from the vagina.   Your pelvic pain increases.   You feel light-headed or faint.   You have chills.   You have pain with urination or blood in your urine.   You have uncontrolled diarrhea or vomiting.   You have a fever or persistent symptoms for more than 3 days.  You have a fever and your symptoms suddenly get worse.   You are being physically or sexually abused.   This information is not intended to replace advice given to you by your health care provider. Make sure you discuss any questions you have with your health care provider.   Document Released: 01/21/2004 Document Revised: 11/14/2014 Document Reviewed: 06/15/2011 Elsevier Interactive Patient Education [employer]

## 2015-09-26 NOTE — MAU Provider Note (Signed)
Chief Complaint:  Abdominal Pain   First Provider Initiated Contact with Patient 09/26/15 0756     HPI  Sue [name] is a 40 y.o. [id number] who presents to maternity admissions reporting lower abdominal pain since Tuesday. Pain refers upward toward middle abdomen bilaterally.  Denies bleeding or discharge. . She reports vaginal itching/burning, urinary symptoms, h/a, dizziness, n/v, or fever/chills.   Has an appt with Dr Clearance Coots first week in August History of ? Fibroids, hx kidney stones (neg in May 2017)  RN Note: Pt reports having lower abd pain That radiates up both sides since Tuesday.Denies vag bleeding or discharge.          Past Medical History: Past Medical History  Diagnosis Date  . Asthma     mild by PFT's 03/16/1998  . Sciatica 01/12/06  . History of abnormal Pap smear     s/p cryo( Dr. Wiliam Ke)  . Cervical lymphadenopathy     biopsy negativeMarvetta Gibbons)  . Normal vaginal delivery     x3  . Complete spontaneous abortion     x2  . Hypertension   . HSV (herpes simplex virus) anogenital infection   . GERD (gastroesophageal reflux disease)   . 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   . Vaginal Pap smear, abnormal   . Kidney stone     Past obstetric history: OB History  Gravida Para Term Preterm AB SAB TAB Ectopic Multiple Living  6 3 2 1 3 3    3     # Outcome Date GA Lbr Len/2nd Weight Sex Delivery Anes PTL Lv  6 Term 07/06/05 [redacted]w[redacted]d  6 lb 12 oz (3.062 kg) M Vag-Spont None  Y  5 SAB 2006 [redacted]w[redacted]d       N  4 Term 03/27/96 [redacted]w[redacted]d  7 lb 12 oz (3.515 kg) M Vag-Spont None  Y  3 SAB 1996        N  2 Preterm 02/17/92 [redacted]w[redacted]d  3 lb 11 oz (1.673 kg) M Vag-Spont None  Y  1 SAB 1992        N      Past Surgical History: Past Surgical History  Procedure Laterality Date  . Biopsy of lymph node in r neck  2007.  2008 [facility]       Family History: Family History  Problem Relation Age of Onset  . Ulcerative colitis Mother   . Hypertension Mother   . Diabetes Father   . Heart attack Father   . Hypertension Father   . Diabetes Sister   . Heart attack Maternal Grandmother     Social History: Social History  Substance Use Topics  . Smoking status: Never Smoker   . Smokeless tobacco: Never Used  . Alcohol Use: 0.0 oz/week    0 Standard drinks or equivalent per week     Comment: occasional beer and liquor on weekends    Allergies:  Allergies  Allergen Reactions  . Sulfamethoxazole-Trimethoprim Swelling    Swelling is of the lips.  . Amoxicillin Rash    Has patient had a PCN reaction causing immediate rash, facial/tongue/throat swelling, SOB or lightheadedness with hypotension: no Has patient had a PCN reaction causing severe rash involving mucus membranes or skin necrosis: pt had rash -- non severe  Has patient had a PCN reaction that required hospitalization: no Has patient had a PCN reaction  occurring within the last 10 years: no If all of the above answers are "NO", then may proceed with Cephalosporin use.     Meds:  Prescriptions prior to admission  Medication Sig Dispense Refill Last Dose  . acetaminophen (TYLENOL) 500 MG tablet Take 1,000 mg by mouth every 6 (six) hours as needed for mild pain, moderate pain or headache.   07/27/2015 at 1130  . albuterol (PROVENTIL HFA) 108 (90 BASE) MCG/ACT inhaler Inhale 2 puffs into the lungs every 6 (six) hours as needed for wheezing. 1 Inhaler 6 unknown  . amLODipine (NORVASC) 5 MG tablet take 1 tablet by mouth once daily 30 tablet 11   . cetirizine (ZYRTEC) 10 MG tablet Take 1 tablet (10 mg total) by mouth daily. (Patient taking differently: Take 10 mg by mouth daily as needed for allergies. ) 30 tablet 6 unknown  . meloxicam (MOBIC) 7.5 MG tablet Take 7.5 mg by mouth daily as needed for pain.   0 07/22/2015  . Multiple Vitamin (MULITIVITAMIN WITH MINERALS)  TABS Take 1 tablet by mouth daily.   Past Week at Unknown time  . oxyCODONE-acetaminophen (PERCOCET/ROXICET) 5-325 MG tablet Take 2 tablets by mouth every 4 (four) hours as needed for severe pain. 6 tablet 0   . pantoprazole (PROTONIX) 40 MG tablet Take 40 mg by mouth daily.  1 07/22/2015    I have reviewed patient's Past Medical Hx, Surgical Hx, Family Hx, Social Hx, medications and allergies.  ROS:  Review of Systems  Constitutional: Negative for fever and chills.  Respiratory: Negative for shortness of breath.   Gastrointestinal: Positive for abdominal pain and constipation ("a little"). Negative for nausea, vomiting and diarrhea.  Genitourinary: Positive for pelvic pain. Negative for dysuria, flank pain and difficulty urinating.  Musculoskeletal: Negative for back pain.   Other systems negative   Physical Exam  Patient Vitals for the past 24 hrs:  BP Temp Temp src Pulse Resp Height Weight  09/26/15 0737 118/82 mmHg 98.6 F (37 C) Oral 79 18 5\' 4"  (1.626 m) 175 lb 1.9 oz (79.434 kg)   Constitutional: Well-developed, well-nourished female in no acute distress.  Cardiovascular: normal rate and rhythm, no ectopy audible, S1 & S2 heard, no murmur Respiratory: normal effort, no distress. Lungs CTAB with no wheezes or crackles GI: Abd soft, mildly tender over suprapubic area  Nondistended.  No rebound, No guarding.  Bowel Sounds audible  MS: Extremities nontender, no edema, normal ROM Neurologic: Alert and oriented x 4.   Grossly nonfocal. GU: Neg CVAT. Skin:  Warm and Dry Psych:  Affect appropriate.  PELVIC EXAM: Cervix pink, visually closed, without lesion, scant white creamy discharge, vaginal walls and external genitalia normal Bimanual exam: Cervix firm, neg CMT, uterus nontender, somewhat enlarged (10-12 wk size), retroverted, adnexa without tenderness, enlargement, or mass    Labs:    Results for orders placed or performed during the hospital encounter of 09/26/15 (from the  past 72 hour(s))  Urinalysis, Routine w reflex microscopic (not at [facility])     Status: Abnormal   Collection Time: 09/26/15  7:40 AM  Result Value Ref Range   Color, Urine YELLOW YELLOW   APPearance CLEAR CLEAR   Specific Gravity, Urine 1.025 1.005 - 1.030   pH 5.5 5.0 - 8.0   Glucose, UA NEGATIVE NEGATIVE mg/dL   Hgb urine dipstick MODERATE (A) NEGATIVE   Bilirubin Urine NEGATIVE NEGATIVE   Ketones, ur NEGATIVE NEGATIVE mg/dL   Protein, ur NEGATIVE NEGATIVE mg/dL   Nitrite NEGATIVE  NEGATIVE   Leukocytes, UA NEGATIVE NEGATIVE  Urine microscopic-add on     Status: Abnormal   Collection Time: 09/26/15  7:40 AM  Result Value Ref Range   Squamous Epithelial / LPF 0-5 (A) NONE SEEN   WBC, UA 0-5 0 - 5 WBC/hpf   RBC / HPF NONE SEEN 0 - 5 RBC/hpf   Bacteria, UA NONE SEEN NONE SEEN   Urine-Other MUCOUS PRESENT   Pregnancy, urine POC     Status: None   Collection Time: 09/26/15  7:54 AM  Result Value Ref Range   Preg Test, Ur NEGATIVE NEGATIVE    Comment:        THE SENSITIVITY OF THIS METHODOLOGY IS >24 mIU/mL   CBC with Differential/Platelet     Status: None   Collection Time: 09/26/15  8:34 AM  Result Value Ref Range   WBC 5.9 4.0 - 10.5 K/uL   RBC 4.37 3.87 - 5.11 MIL/uL   Hemoglobin 12.7 12.0 - 15.0 g/dL   HCT 86.7 61.9 - 50.9 %   MCV 84.7 78.0 - 100.0 fL   MCH 29.1 26.0 - 34.0 pg   MCHC 34.3 30.0 - 36.0 g/dL   RDW 32.6 71.2 - 45.8 %   Platelets 304 150 - 400 K/uL   Neutrophils Relative % 61 %   Neutro Abs 3.5 1.7 - 7.7 K/uL   Lymphocytes Relative 32 %   Lymphs Abs 1.9 0.7 - 4.0 K/uL   Monocytes Relative 3 %   Monocytes Absolute 0.2 0.1 - 1.0 K/uL   Eosinophils Relative 4 %   Eosinophils Absolute 0.3 0.0 - 0.7 K/uL   Basophils Relative 0 %   Basophils Absolute 0.0 0.0 - 0.1 K/uL     Imaging:  No results found.  MAU Course/MDM: I have ordered labs as follows:  UA, CBC (to help rule out appendicitis) Imaging ordered: Outpatient [place] of pelvis and renal (to assess  for fibroids and possible bladder stones) Results reviewed.     Pt stable at time of discharge.  Assessment: Pelvic pain of unclear etiology Hematuria  Plan: Discharge home Recommend Follow up with Dr Clearance Coots in office as scheduled Rx sent for Dilaudid PO for Pain due to intolerance of other meds. LImited #[id number]  Warned not to drive while taking    Medication List    ASK your doctor about these medications        acetaminophen 500 MG tablet  Commonly known as:  TYLENOL  Take 1,000 mg by mouth every 6 (six) hours as needed for mild pain, moderate pain or headache.     albuterol 108 (90 Base) MCG/ACT inhaler  Commonly known as:  PROVENTIL HFA  Inhale 2 puffs into the lungs every 6 (six) hours as needed for wheezing.     amLODipine 5 MG tablet  Commonly known as:  NORVASC  take 1 tablet by mouth once daily     cetirizine 10 MG tablet  Commonly known as:  ZYRTEC  Take 1 tablet (10 mg total) by mouth daily.     meloxicam 7.5 MG tablet  Commonly known as:  MOBIC  Take 7.5 mg by mouth daily as needed for pain.     multivitamin with minerals Tabs tablet  Take 1 tablet by mouth daily.     oxyCODONE-acetaminophen 5-325 MG tablet  Commonly known as:  PERCOCET/ROXICET  Take 2 tablets by mouth every 4 (four) hours as needed for severe pain.     pantoprazole 40 MG tablet  Commonly known as:  PROTONIX  Take 40 mg by mouth daily.       Encouraged to return here or to other Urgent Care/ED if she develops worsening of symptoms, increase in pain, fever, or other concerning symptoms.   Wynelle Bourgeois CNM, MSN Certified Nurse-Midwife 09/26/2015 7:57 AM

## 2015-09-26 NOTE — MAU Note (Signed)
Pt reports having lower abd pain  That radiates up both sides since Tuesday.Denies vag bleeding or discharge.

## 2015-10-03 ENCOUNTER — Ambulatory Visit (HOSPITAL_COMMUNITY)
Admission: RE | Admit: 2015-10-03 | Discharge: 2015-10-03 | Disposition: A | Discharge status: Home / Self Care | Payer: Medicaid Other | Source: Ambulatory Visit | Attending: Advanced Practice Midwife | Admitting: Advanced Practice Midwife

## 2015-10-03 DIAGNOSIS — R102 Pelvic and perineal pain: Secondary | ICD-10-CM | POA: Diagnosis present

## 2015-10-03 DIAGNOSIS — N854 Malposition of uterus: Secondary | ICD-10-CM | POA: Insufficient documentation

## 2015-10-06 ENCOUNTER — Ambulatory Visit (HOSPITAL_COMMUNITY)
Admission: EM | Admit: 2015-10-06 | Discharge: 2015-10-06 | Disposition: A | Discharge status: Home / Self Care | Payer: Medicaid Other | Attending: Emergency Medicine | Admitting: Emergency Medicine

## 2015-10-06 ENCOUNTER — Encounter (HOSPITAL_COMMUNITY): Payer: Self-pay | Admitting: *Deleted

## 2015-10-06 DIAGNOSIS — S61219A Laceration without foreign body of unspecified finger without damage to nail, initial encounter: Secondary | ICD-10-CM | POA: Diagnosis not present

## 2015-10-06 NOTE — ED Triage Notes (Signed)
Patient reports cutting her hand yesterday on a piece of glass, well approximated laceration to inside of right hang to first knuckle of pointer finger. Patient states area has been bleeding. Dried blood noted. RN has helped patient soak and clean area.

## 2015-10-06 NOTE — ED Provider Notes (Signed)
[facility]    CSN: [id number] Arrival date & time: 10/06/15  1331  First Provider Contact:  First MD Initiated Contact with Patient 10/06/15 1552        History   Chief Complaint Chief Complaint  Patient presents with  . Laceration  . Hand Injury    HPI Sue [name] is a 40 y.o. female.   She is a 40 year old woman here for evaluation of right index finger laceration. This occurred yesterday while washing dishes. She denies any foreign body. She states [place] was [place] the beach on [place] so she used some wound glue rather than seeing a doctor. It has continued to intermittently ooze blood, especially with making a fist.      Past Medical History:  Diagnosis Date  . Asthma    mild by PFT's 03/16/1998  . Cervical lymphadenopathy    biopsy negativeMarvetta Gibbons)  . Complete spontaneous abortion    x2  . GERD (gastroesophageal reflux disease)   . History of abnormal Pap smear    s/p cryo( Dr. Wiliam Ke)  . HSV (herpes simplex virus) anogenital infection   . Hypertension   . Kidney stone   . Normal vaginal delivery    x3  . Sciatica 01/12/06  . 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   . Vaginal Pap smear, abnormal     Patient Active Problem List   Diagnosis Date Noted  . Dysuria 07/25/2015  . Throat swelling 10/31/2014  . Healthcare maintenance 09/12/2014  . Acute anterior uveitis 12/13/2013  . Mid back pain 11/02/2013  . Seasonal allergies 11/02/2013  . 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 Lumbago 05/18/2011  . Carpal tunnel syndrome 04/10/2011  . Tension headache 04/10/2011  . HERNIATED DISC 12/31/2009  . OTHER VOICE AND RESONANCE DISORDERS 01/30/2008  . 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 Essential hypertension 06/15/2007  . Asthma with acute exacerbation 02/08/2006  . SCIATICA 02/08/2006  . PAP SMEAR,  LGSIL, ABNORMAL 02/08/2006    Past Surgical History:  Procedure Laterality Date  . biopsy of lymph node in R neck  2007.  Marland Kitchen LEEP      OB History    Gravida Para Term Preterm AB Living   6 3 2 1 3 3    SAB TAB Ectopic Multiple Live Births   3       3       Home Medications    Prior to Admission medications   Medication Sig Start Date End Date Taking? Authorizing Provider  albuterol (PROVENTIL HFA) 108 (90 BASE) MCG/ACT inhaler Inhale 2 puffs into the lungs every 6 (six) hours as needed for wheezing. 05/23/14 06/21/16 Yes 06/23/16, MD  amLODipine (NORVASC) 5 MG tablet take 1 tablet by mouth once daily 09/07/15  Yes 11/08/15, MD  pantoprazole (PROTONIX) 40 MG tablet Take 40 mg by mouth daily. 07/24/15  Yes Historical Provider, MD  acetaminophen (TYLENOL) 500 MG tablet Take 1,000 mg by mouth every 6 (six) hours as needed for mild pain, moderate pain or headache.    Historical Provider, MD  cetirizine (ZYRTEC) 10 MG tablet Take 1 tablet (10 mg total) by mouth daily. Patient taking differently: Take 10 mg by mouth daily as needed for allergies.  11/02/13   11/04/13, MD  meloxicam (MOBIC) 7.5 MG tablet Take 7.5 mg by mouth daily as needed for pain.  07/24/15   Historical Provider, MD  Multiple Vitamin (MULITIVITAMIN WITH MINERALS) TABS Take 1 tablet by mouth daily.    Historical Provider, MD    Family History Family History  Problem Relation Age of Onset  . Ulcerative colitis Mother   . Hypertension Mother   . Diabetes Father   . Heart attack Father   . Hypertension Father   . Diabetes Sister   . Heart attack Maternal Grandmother     Social History Social History  Substance Use Topics  . Smoking status: Never Smoker  . Smokeless tobacco: Never Used  . Alcohol use 0.0 oz/week     Comment: occasional beer and liquor on weekends     Allergies   Sulfamethoxazole-trimethoprim and Amoxicillin   Review of Systems Review of Systems  Skin: Positive for wound.       Physical Exam Triage Vital Signs ED Triage Vitals [10/06/15 1555]  Enc Vitals Group     BP 130/78     Pulse Rate 78     Resp 17     Temp 98.1 F (36.7 C)     Temp Source Oral     SpO2 100 %     Weight      Height      Head Circumference      Peak Flow      Pain Score      Pain Loc      Pain Edu?      Excl. in GC?    No data found.   Updated Vital Signs BP 130/78 (BP Location: Right Arm)   Pulse 78   Temp 98.1 F (36.7 C) (Oral)   Resp 17   LMP 09/29/2015   SpO2 100%   Visual Acuity Right Eye Distance:   Left Eye Distance:   Bilateral Distance:    Right Eye Near:   Left Eye Near:    Bilateral Near:     Physical Exam  Constitutional: She is oriented to person, place, and time. She appears well-developed and well-nourished. No distress.  Cardiovascular: Normal rate.   Pulmonary/Chest: Effort normal.  Neurological: She is alert and oriented to person, place, and time.  Skin:  1.5 cm the laceration to the left lateral index finger over the MCP joint. No active bleeding. No erythema.     UC Treatments / Results  Labs (all labs ordered are listed, but only abnormal results are displayed) Labs Reviewed - No data to display  EKG  EKG Interpretation None       Radiology No results found.  Procedures Procedures (including critical care time)  Medications Ordered in UC Medications - No data to display   Initial Impression / Assessment and Plan / UC Course  I have reviewed the triage vital signs and the nursing notes.  Pertinent labs & imaging results that were available during my care of the patient were reviewed by me and considered in my medical decision making (see chart for details).  Clinical Course    Wound cleaned and Steri-Strips applied. Wound care instructions given. Follow-up as needed.  Final Clinical Impressions(s) / UC Diagnoses   Final diagnoses:  Finger laceration, initial encounter    New Prescriptions Current  Discharge Medication List       Charm Rings, MD 10/06/15 1622

## 2015-10-06 NOTE — Discharge Instructions (Signed)
I put some Steri-Strips on the cut so it doesn't keep opening. These should stay pretty well stuck on for 5-7 days before falling off. It is okay to wash your hands, but do not soak them in water. Make sure you pat them dry.  Follow-up as needed.

## 2015-10-09 ENCOUNTER — Telehealth: Payer: Self-pay | Admitting: *Deleted

## 2015-10-09 NOTE — Telephone Encounter (Signed)
Message received from ultrasound dept on 7/27 stating that pt needed to be notified of her results. I called her and informed her that the ultrasound of kidneys and pelvis were normal. There was no abnormality on either ultrasound which would explain the pain she was having when she was evaluated @ [facility]. Per [facility] note by Wynelle Bourgeois, CNM pr is to follow up with Dr. Clearance Coots. Pt voiced understanding of all information and instructions given.

## 2015-10-10 ENCOUNTER — Ambulatory Visit: Payer: Medicaid Other | Admitting: Obstetrics

## 2015-10-30 ENCOUNTER — Other Ambulatory Visit: Payer: Self-pay | Admitting: Obstetrics

## 2015-10-30 DIAGNOSIS — B009 Herpesviral infection, unspecified: Secondary | ICD-10-CM

## 2015-11-21 ENCOUNTER — Ambulatory Visit (INDEPENDENT_AMBULATORY_CARE_PROVIDER_SITE_OTHER): Payer: Medicaid Other | Admitting: Obstetrics

## 2015-11-21 ENCOUNTER — Encounter: Payer: Self-pay | Admitting: Obstetrics

## 2015-11-21 VITALS — BP 144/90 | HR 73 | Ht 64.0 in | Wt 173.5 lb

## 2015-11-21 DIAGNOSIS — Z01419 Encounter for gynecological examination (general) (routine) without abnormal findings: Secondary | ICD-10-CM | POA: Diagnosis not present

## 2015-11-21 DIAGNOSIS — Z0000 Encounter for general adult medical examination without abnormal findings: Secondary | ICD-10-CM

## 2015-11-21 DIAGNOSIS — Z3009 Encounter for other general counseling and advice on contraception: Secondary | ICD-10-CM

## 2015-11-21 DIAGNOSIS — M1990 Unspecified osteoarthritis, unspecified site: Secondary | ICD-10-CM

## 2015-11-21 DIAGNOSIS — R300 Dysuria: Secondary | ICD-10-CM | POA: Diagnosis not present

## 2015-11-21 LAB — POCT URINALYSIS DIPSTICK
Bilirubin, UA: NEGATIVE
Blood, UA: 250
Glucose, UA: NEGATIVE
Ketones, UA: NEGATIVE
Leukocytes, UA: NEGATIVE
Nitrite, UA: NEGATIVE
Spec Grav, UA: 1.025
Urobilinogen, UA: NEGATIVE
pH, UA: 5

## 2015-11-21 NOTE — Progress Notes (Signed)
Subjective:        Sue [name] is a 40 y.o. female here for a routine exam.  Current complaints: joint pains.    Personal health questionnaire:  Is patient Sue [name], have a family history of breast and/or ovarian cancer: no Is there a family history of uterine cancer diagnosed at age < 12, gastrointestinal cancer, urinary tract cancer, family member who is a Personnel officer syndrome-associated carrier: no Is the patient overweight and hypertensive, family history of diabetes, personal history of gestational diabetes, preeclampsia or PCOS: no Is patient over 24, have PCOS,  family history of premature CHD under age 66, diabetes, smoke, have hypertension or peripheral artery disease:  no At any time, has a partner hit, kicked or otherwise hurt or frightened you?: no Over the past 2 weeks, have you felt down, depressed or hopeless?: no Over the past 2 weeks, have you felt little interest or pleasure in doing things?:no   Gynecologic History Patient's last menstrual period was 10/28/2015. Contraception: none Last Pap: 2016. Results were: normal Last mammogram: n/a. Results were: n/a  Obstetric History OB History  Gravida Para Term Preterm AB Living  6 3 2 1 3 3   SAB TAB Ectopic Multiple Live Births  3       3    # Outcome Date GA Lbr Len/2nd Weight Sex Delivery Anes PTL Lv  6 Term 07/06/05 [redacted]w[redacted]d  6 lb 12 oz (3.062 kg) M Vag-Spont None  LIV  5 SAB 2006 [redacted]w[redacted]d       DEC  4 Term 03/27/96 [redacted]w[redacted]d  7 lb 12 oz (3.515 kg) M Vag-Spont None  LIV  3 SAB 1996        DEC  2 Preterm 02/17/92 [redacted]w[redacted]d  3 lb 11 oz (1.673 kg) M Vag-Spont None  LIV  1 SAB 1992        DEC      Past Medical History:  Diagnosis Date  . Asthma    mild by PFT's 03/16/1998  . Cervical lymphadenopathy    biopsy negative3/10/1998)  . Complete spontaneous abortion    x2  . GERD (gastroesophageal reflux disease)   . History of abnormal Pap smear    s/p cryo( Dr. [name]  . HSV (herpes simplex virus) anogenital  infection   . Hypertension   . Kidney stone   . Normal vaginal delivery    x3  . Sciatica 01/12/06  . 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   . Vaginal Pap smear, abnormal     Past Surgical History:  Procedure Laterality Date  . biopsy of lymph node in R neck  2007.  2008 LEEP       Current Outpatient Prescriptions:  .  amLODipine (NORVASC) 5 MG tablet, take 1 tablet by mouth once daily, Disp: 30 tablet, Rfl: 11 .  meloxicam (MOBIC) 7.5 MG tablet, Take 7.5 mg by mouth daily as needed for pain. , Disp: , Rfl: 0 .  pantoprazole (PROTONIX) 40 MG tablet, Take 40 mg by mouth daily., Disp: , Rfl: 1 .  Probiotic Product (PROBIOTIC-10 PO), Take by mouth., Disp: , Rfl:  .  valACYclovir (VALTREX) 500 MG tablet, take 1 tablet by mouth twice a day for 3 days, Disp: 30 tablet, Rfl: PRN .  acetaminophen (TYLENOL) 500 MG tablet, Take 1,000 mg by mouth every 6 (six) hours as needed  for mild pain, moderate pain or headache., Disp: , Rfl:  .  albuterol (PROVENTIL HFA) 108 (90 BASE) MCG/ACT inhaler, Inhale 2 puffs into the lungs every 6 (six) hours as needed for wheezing. (Patient not taking: Reported on 11/21/2015), Disp: 1 Inhaler, Rfl: 6 .  cetirizine (ZYRTEC) 10 MG tablet, Take 1 tablet (10 mg total) by mouth daily. (Patient taking differently: Take 10 mg by mouth daily as needed for allergies. ), Disp: 30 tablet, Rfl: 6 .  Multiple Vitamin (MULITIVITAMIN WITH MINERALS) TABS, Take 1 tablet by mouth daily., Disp: , Rfl:  Allergies  Allergen Reactions  . Sulfamethoxazole-Trimethoprim Swelling    Swelling is of the lips.  . Amoxicillin Rash    Has patient had a PCN reaction causing immediate rash, facial/tongue/throat swelling, SOB or lightheadedness with hypotension: no Has patient had a PCN reaction causing severe rash involving mucus membranes or skin  necrosis: pt had rash -- non severe  Has patient had a PCN reaction that required hospitalization: no Has patient had a PCN reaction occurring within the last 10 years: no If all of the above answers are "NO", then may proceed with Cephalosporin use.     Social History  Substance Use Topics  . Smoking status: Never Smoker  . Smokeless tobacco: Never Used  . Alcohol use 0.0 oz/week     Comment: occasional beer and liquor on weekends    Family History  Problem Relation Age of Onset  . Ulcerative colitis Mother   . Hypertension Mother   . Diabetes Father   . Heart attack Father   . Hypertension Father   . Diabetes Sister   . Heart attack Maternal Grandmother       Review of Systems  Constitutional: negative for fatigue and weight loss Respiratory: negative for cough and wheezing Cardiovascular: negative for chest pain, fatigue and palpitations Gastrointestinal: negative for abdominal pain and change in bowel habits Musculoskeletal:positive for myalgias Neurological: negative for gait problems and tremors Behavioral/Psych: negative for abusive relationship, depression Endocrine: negative for temperature intolerance   Genitourinary:negative for abnormal menstrual periods, genital lesions, hot flashes, sexual problems and vaginal discharge Integument/breast: negative for breast lump, breast tenderness, nipple discharge and skin lesion(s)    Objective:       BP (!) 144/90   Pulse 73   Ht 5\' 4"  (1.626 m)   Wt 173 lb 8 oz (78.7 kg)   LMP 10/28/2015   BMI 29.78 kg/m  General:   alert  Skin:   no rash or abnormalities  Lungs:   clear to auscultation bilaterally  Heart:   regular rate and rhythm, S1, S2 normal, no murmur, click, rub or gallop  Breasts:   normal without suspicious masses, skin or nipple changes or axillary nodes  Abdomen:  normal findings: no organomegaly, soft, non-tender and no hernia  Pelvis:  External genitalia: normal general appearance Urinary system:  urethral meatus normal and bladder without fullness, nontender Vaginal: normal without tenderness, induration or masses Cervix: normal appearance Adnexa: normal bimanual exam Uterus: anteverted and non-tender, normal size   Lab Review Urine pregnancy test Labs reviewed yes Radiologic studies reviewed no  50% of 20 min visit spent on counseling and coordination of care.   Assessment:    Healthy female exam.    Arthritis  Contraceptive counseling and advice   Plan:    Continue NSAIDS prn  Education reviewed: calcium supplements, depression evaluation, low fat, low cholesterol diet, safe sex/STD prevention, self breast exams and weight bearing exercise. Contraception:  none. Follow up in: 1 year.   Meds ordered this encounter  Medications  . Probiotic Product (PROBIOTIC-10 PO)    Sig: Take by mouth.   No orders of the defined types were placed in this encounter.    Patient ID: Sue [name], female   DOB: 08/04/1975, 40 y.o.   MRN: [id number]

## 2015-11-22 LAB — HEPATITIS B SURFACE ANTIGEN: Hepatitis B Surface Ag: NEGATIVE

## 2015-11-22 LAB — HEPATITIS C ANTIBODY: Hep C Virus Ab: <0.1 s/co ratio (ref 0.0–0.9)

## 2015-11-22 LAB — RPR: RPR Ser Ql: NONREACTIVE

## 2015-11-22 LAB — HIV ANTIBODY (ROUTINE TESTING W REFLEX): HIV Screen 4th Generation wRfx: NONREACTIVE

## 2015-11-23 LAB — URINE CULTURE

## 2015-11-25 LAB — PAP IG AND HPV HIGH-RISK
HPV, high-risk: NEGATIVE
PAP Smear Comment: 0

## 2015-11-28 ENCOUNTER — Other Ambulatory Visit: Payer: Self-pay | Admitting: Obstetrics

## 2015-11-28 ENCOUNTER — Telehealth: Payer: Self-pay | Admitting: *Deleted

## 2015-11-28 DIAGNOSIS — N760 Acute vaginitis: Principal | ICD-10-CM

## 2015-11-28 DIAGNOSIS — B9689 Other specified bacterial agents as the cause of diseases classified elsewhere: Secondary | ICD-10-CM

## 2015-11-28 DIAGNOSIS — B3731 Acute candidiasis of vulva and vagina: Secondary | ICD-10-CM

## 2015-11-28 DIAGNOSIS — B373 Candidiasis of vulva and vagina: Secondary | ICD-10-CM

## 2015-11-28 LAB — NUSWAB VG+, CANDIDA 6SP
Candida albicans, NAA: POSITIVE — AB
Candida glabrata, NAA: NEGATIVE
Candida krusei, NAA: NEGATIVE
Candida lusitaniae, NAA: NEGATIVE
Candida parapsilosis, NAA: NEGATIVE
Candida tropicalis, NAA: NEGATIVE
Chlamydia trachomatis, NAA: NEGATIVE
Megasphaera 1: HIGH Score — AB
Neisseria gonorrhoeae, NAA: NEGATIVE
Trich vag by NAA: NEGATIVE

## 2015-11-28 MED ORDER — METRONIDAZOLE 500 MG PO TABS: 500.0000 mg | ORAL_TABLET | Freq: Two times a day (BID) | ORAL | 2 refills | Status: DC

## 2015-11-28 MED ORDER — FLUCONAZOLE 150 MG PO TABS: 150.0000 mg | ORAL_TABLET | Freq: Once | ORAL | 0 refills | Status: AC

## 2015-11-28 NOTE — Telephone Encounter (Signed)
Patient aware of lab result and medication sent to her pharmacy.Cautioned not to drink alcohol with this medication because it can make her vomit.

## 2015-11-28 NOTE — Telephone Encounter (Signed)
Phone message left for patient to call for lab results. 

## 2015-12-03 ENCOUNTER — Other Ambulatory Visit: Payer: Self-pay | Admitting: *Deleted

## 2015-12-03 DIAGNOSIS — B9689 Other specified bacterial agents as the cause of diseases classified elsewhere: Secondary | ICD-10-CM

## 2015-12-03 DIAGNOSIS — N760 Acute vaginitis: Principal | ICD-10-CM

## 2015-12-03 MED ORDER — CLINDAMYCIN HCL 300 MG PO CAPS: 300.0000 mg | ORAL_CAPSULE | Freq: Three times a day (TID) | ORAL | 0 refills | Status: AC

## 2015-12-03 NOTE — Progress Notes (Signed)
Pt called to office stating that she was prescribed Flagyl for BV. Pt states that she is having bad GI upset.  Pt request another medication if possible.  Reviewed with Dr Clearance Coots, Clindamycin 300mg  ordered. Rx was sent to pharmacy. Pt aware.

## 2015-12-13 ENCOUNTER — Ambulatory Visit (INDEPENDENT_AMBULATORY_CARE_PROVIDER_SITE_OTHER): Payer: Medicaid Other | Admitting: Obstetrics

## 2015-12-13 VITALS — BP 134/91 | HR 73 | Temp 98.8°F | Wt 169.8 lb

## 2015-12-13 DIAGNOSIS — R221 Localized swelling, mass and lump, neck: Secondary | ICD-10-CM | POA: Diagnosis not present

## 2015-12-13 DIAGNOSIS — T7840XA Allergy, unspecified, initial encounter: Secondary | ICD-10-CM

## 2015-12-13 LAB — POCT RAPID STREP A (OFFICE): Rapid Strep A Screen: NEGATIVE

## 2015-12-13 NOTE — Progress Notes (Signed)
Patient is in office for throat swelling that may be associated to her medication. Strep test performed, with negative results.

## 2015-12-16 ENCOUNTER — Encounter: Payer: Self-pay | Admitting: Obstetrics

## 2015-12-16 NOTE — Progress Notes (Signed)
Patient ID: Sue [name], female   DOB: 08-10-1975, 40 y.o.   MRN: [id number]  Chief Complaint  Patient presents with  . Medication Problem    Patient is in office for problem r/t medication.    HPI Sue [name] is a 40 y.o. female.  Patient swelling of throat after taking Clindamycin for BV.  Resolved after stopping Clindamycin. HPI  Past Medical History:  Diagnosis Date  . Asthma    mild by PFT's 03/16/1998  . Cervical lymphadenopathy    biopsy negativeMarvetta Gibbons)  . Complete spontaneous abortion    x2  . GERD (gastroesophageal reflux disease)   . History of abnormal Pap smear    s/p cryo( Dr. Wiliam Ke)  . HSV (herpes simplex virus) anogenital infection   . Hypertension   . Kidney stone   . Normal vaginal delivery    x3  . Sciatica 01/12/06  . 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   . Vaginal Pap smear, abnormal     Past Surgical History:  Procedure Laterality Date  . biopsy of lymph node in R neck  2007.  Marland Kitchen LEEP      Family History  Problem Relation Age of Onset  . Ulcerative colitis Mother   . Hypertension Mother   . Diabetes Father   . Heart attack Father   . Hypertension Father   . Diabetes Sister   . Heart attack Maternal Grandmother     Social History Social History  Substance Use Topics  . Smoking status: Never Smoker  . Smokeless tobacco: Never Used  . Alcohol use 0.0 oz/week     Comment: occasional beer and liquor on weekends    Allergies  Allergen Reactions  . Sulfamethoxazole-Trimethoprim Swelling    Swelling is of the lips.  . Amoxicillin Rash    Has patient had a PCN reaction causing immediate rash, facial/tongue/throat swelling, SOB or lightheadedness with hypotension: no Has patient had a PCN reaction causing severe rash involving mucus membranes or skin necrosis: pt had rash -- non severe  Has  patient had a PCN reaction that required hospitalization: no Has patient had a PCN reaction occurring within the last 10 years: no If all of the above answers are "NO", then may proceed with Cephalosporin use.     Current Outpatient Prescriptions  Medication Sig Dispense Refill  . acetaminophen (TYLENOL) 500 MG tablet Take 1,000 mg by mouth every 6 (six) hours as needed for mild pain, moderate pain or headache.    . albuterol (PROVENTIL HFA) 108 (90 BASE) MCG/ACT inhaler Inhale 2 puffs into the lungs every 6 (six) hours as needed for wheezing. (Patient not taking: Reported on 11/21/2015) 1 Inhaler 6  . amLODipine (NORVASC) 5 MG tablet take 1 tablet by mouth once daily 30 tablet 11  . cetirizine (ZYRTEC) 10 MG tablet Take 1 tablet (10 mg total) by mouth daily. (Patient taking differently: Take 10 mg by mouth daily as needed for allergies. ) 30 tablet 6  . meloxicam (MOBIC) 7.5 MG tablet Take 7.5 mg by mouth daily as needed for pain.   0  . metroNIDAZOLE (FLAGYL) 500 MG tablet Take 1 tablet (500 mg total) by mouth 2 (two) times daily. (Patient not taking: Reported on 12/13/2015) 14 tablet 2  . Multiple Vitamin (MULITIVITAMIN WITH MINERALS) TABS Take 1 tablet by mouth daily.    . pantoprazole (PROTONIX)  40 MG tablet Take 40 mg by mouth daily.  1  . Probiotic Product (PROBIOTIC-10 PO) Take by mouth.    . valACYclovir (VALTREX) 500 MG tablet take 1 tablet by mouth twice a day for 3 days 30 tablet PRN   No current facility-administered medications for this visit.     Review of Systems Review of Systems Constitutional: negative for fatigue and weight loss Respiratory: negative for cough and wheezing Cardiovascular: negative for chest pain, fatigue and palpitations Gastrointestinal: negative for abdominal pain and change in bowel habits Genitourinary:negative Integument/breast: negative for nipple discharge Musculoskeletal:negative for myalgias Neurological: negative for gait problems and  tremors Behavioral/Psych: negative for abusive relationship, depression Endocrine: negative for temperature intolerance     Blood pressure (!) 134/91, pulse 73, temperature 98.8 F (37.1 C), temperature source Oral, weight 169 lb 12.8 oz (77 kg), last menstrual period 10/28/2015.  Physical Exam Physical Exam:  Deferred  >50% of 10 min visit spent on counseling and coordination of care.   Data Reviewed Labs  Assessment     Allergic reaction to Clindamycin H/O BV.  Resolved.    Plan    D/C Clindamycin F/U prn   Orders Placed This Encounter  Procedures  . POCT rapid strep A   No orders of the defined types were placed in this encounter.

## 2015-12-18 ENCOUNTER — Ambulatory Visit: Payer: Medicaid Other | Admitting: Obstetrics

## 2016-01-02 ENCOUNTER — Ambulatory Visit (INDEPENDENT_AMBULATORY_CARE_PROVIDER_SITE_OTHER): Payer: Medicaid Other | Admitting: Internal Medicine

## 2016-01-02 ENCOUNTER — Encounter: Payer: Self-pay | Admitting: Internal Medicine

## 2016-01-02 ENCOUNTER — Ambulatory Visit (HOSPITAL_COMMUNITY)
Admission: RE | Admit: 2016-01-02 | Discharge: 2016-01-02 | Disposition: A | Discharge status: Home / Self Care | Payer: Medicaid Other | Source: Ambulatory Visit | Attending: Internal Medicine | Admitting: Internal Medicine

## 2016-01-02 VITALS — BP 111/69 | HR 87 | Temp 98.2°F | Ht 64.0 in | Wt 173.3 lb

## 2016-01-02 DIAGNOSIS — E663 Overweight: Secondary | ICD-10-CM

## 2016-01-02 DIAGNOSIS — Z87440 Personal history of urinary (tract) infections: Secondary | ICD-10-CM

## 2016-01-02 DIAGNOSIS — K760 Fatty (change of) liver, not elsewhere classified: Secondary | ICD-10-CM | POA: Diagnosis not present

## 2016-01-02 DIAGNOSIS — R109 Unspecified abdominal pain: Secondary | ICD-10-CM

## 2016-01-02 DIAGNOSIS — K449 Diaphragmatic hernia without obstruction or gangrene: Secondary | ICD-10-CM | POA: Diagnosis not present

## 2016-01-02 DIAGNOSIS — Z6829 Body mass index (BMI) 29.0-29.9, adult: Secondary | ICD-10-CM | POA: Diagnosis not present

## 2016-01-02 DIAGNOSIS — K219 Gastro-esophageal reflux disease without esophagitis: Secondary | ICD-10-CM

## 2016-01-02 DIAGNOSIS — R1032 Left lower quadrant pain: Secondary | ICD-10-CM | POA: Diagnosis not present

## 2016-01-02 DIAGNOSIS — R3129 Other microscopic hematuria: Secondary | ICD-10-CM

## 2016-01-02 DIAGNOSIS — M545 Low back pain: Secondary | ICD-10-CM | POA: Diagnosis not present

## 2016-01-02 DIAGNOSIS — Z87442 Personal history of urinary calculi: Secondary | ICD-10-CM

## 2016-01-02 LAB — URINALYSIS W MICROSCOPIC (NOT AT ARMC)
Glucose, UA: NEGATIVE mg/dL
Hgb urine dipstick: NEGATIVE
Ketones, ur: NEGATIVE mg/dL
Leukocytes, UA: NEGATIVE
Nitrite: NEGATIVE
Protein, ur: NEGATIVE mg/dL
Specific Gravity, Urine: 1.028 (ref 1.005–1.030)
pH: 6 (ref 5.0–8.0)

## 2016-01-02 LAB — POCT URINE PREGNANCY: Preg Test, Ur: NEGATIVE

## 2016-01-02 LAB — GLUCOSE, CAPILLARY: Glucose-Capillary: 91 mg/dL (ref 65–99)

## 2016-01-02 LAB — POCT GLYCOSYLATED HEMOGLOBIN (HGB A1C): Hemoglobin A1C: 5.4

## 2016-01-02 NOTE — Patient Instructions (Addendum)
Sue [name] it was nice meeting you today.   -I have ordered some labs today and will call you when I get the results.

## 2016-01-03 LAB — BMP8+ANION GAP
Anion Gap: 15 mmol/L (ref 10.0–18.0)
BUN/Creatinine Ratio: 12 (ref 9–23)
BUN: 10 mg/dL (ref 6–20)
CO2: 24 mmol/L (ref 18–29)
Calcium: 9.2 mg/dL (ref 8.7–10.2)
Chloride: 99 mmol/L (ref 96–106)
Creatinine, Ser: 0.81 mg/dL (ref 0.57–1.00)
GFR calc Af Amer: 106 mL/min/{1.73_m2} (ref >59)
GFR calc non Af Amer: 92 mL/min/{1.73_m2} (ref >59)
Glucose: 87 mg/dL (ref 65–99)
Potassium: 5 mmol/L (ref 3.5–5.2)
Sodium: 138 mmol/L (ref 134–144)

## 2016-01-03 LAB — MICROALBUMIN / CREATININE URINE RATIO
Creatinine, Urine: 356.9 mg/dL
Microalb/Creat Ratio: 3.9 mg/g creat (ref 0.0–30.0)
Microalbumin, Urine: 13.9 ug/mL

## 2016-01-05 DIAGNOSIS — E663 Overweight: Secondary | ICD-10-CM | POA: Insufficient documentation

## 2016-01-05 DIAGNOSIS — K760 Fatty (change of) liver, not elsewhere classified: Secondary | ICD-10-CM | POA: Insufficient documentation

## 2016-01-05 DIAGNOSIS — K449 Diaphragmatic hernia without obstruction or gangrene: Secondary | ICD-10-CM | POA: Insufficient documentation

## 2016-01-05 NOTE — Assessment & Plan Note (Signed)
Renal CT done 01/02/2016 with incidental finding of a fatty appearing liver. Spoke to the patient over the phone. States she is aware and denies alcohol use. LFTs normal in 10/2014.   -Educated patient about healthy eating and exercise. Emphasized the importance of weight loss.

## 2016-01-05 NOTE — Assessment & Plan Note (Signed)
Renal CT done 01/02/16 showing a small hiatal hernia (stable since previous study). Pt does endorse GERD symptoms and bloating sensation after eating. She is currently on a PPI. Denies having any dysphagia or odynophagia. Denies any SOB.   -Continue PPI -Explained to the patient signs of hernia incarceration and advised to seek immediate medical attn in that case.

## 2016-01-05 NOTE — Assessment & Plan Note (Signed)
Patient endorses polyuria and is concerned about having diabetes. A1c 5.4 and random blood glucose 91.  -Reassurance  -Educated patient about healthy eating and exercise. Emphasized the importance of weight loss.

## 2016-01-05 NOTE — Assessment & Plan Note (Addendum)
HPI Patient is presenting with a 2 week hx of worsening L flank pain, 8/10 intensity. Reports having intermittent flank pain for the past 3-4 yrs. States she was seen by urology several yrs ago and was told she had kidney stones. She describes the pain as "shock-like" and non-radiating. Denies having any F/C/N/V. Reports having a sensation of not being able to empty her bladder completely. Also reports having urinary frequency and urgency. Denies any trauma to her back. She is a never smoker. 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 Denies any history of malignancy, exposure to alkylating agents (cyclophosphamide), or history of pelvic radiation. Reports having one episode of pyelonephritis and 2-3 episodes of UTI in her lifetime; most recent UTI > 1 yr ago. Denies any family hx of kidney disease. States her LMP ended 10/17.  A Chart review shows patient has been having microscopic hematuria since 2009. Renal CT done 07/2015 negative for renal or ureteral stones or onstruction. Renal US done 09/2015 negative for stone or hydronephrosis; showing a normal bladder. Urine dipstick at this visit showing trace amount of blood but negative for infection. UA came back negative for blood. Urine microalbumin/ cr ratio normal. Lack of hematuria + proteinuria makes glomerular disease less likely. Patient had CVA tenderness but pyelonephritis less likely as no signs of systemic infection. Her renal function is at baseline. Renal CT at this visit showing no nephrolithiasis or obstructive uropathy. It does show SI joint sclerosis bilaterally which may reflect osteoarthritis or sacroiliitis. Patient states she is aware of this finding. This could possibly explain pain in the lower back/ flank region region.   Plan -Consider referral to urology for possible cystoscopy if patient has hematuria at future visit as she does have risk factors for bladder cancer including age >35 and possible  occupational exposure to carcinogens.   Addendum 01/06/16: Will place urology referral at this time for cytoscopy as patient has been having microscopic hematuria since 2009. In addition, she endorses a sensation of being able to empty her bladder completely which is concerning for a possible bladder mass causing outflow tract obstruction.  Addendum 01/07/16: Spoke to the patient over the phone. She is aware of the urology referral. Also it was noted in patient's history that she has ulcerative proctitis, which brings up the possibility of fistula formation between the GI and GU tract. Patient states she is currently being followed  By Dr. Matthias Hughs (gastrolenterology). States she saw him a month ago and flexible sigmoidoscopy was done at that time. States she was told she had hemorrhoids and her next appointment with GI is in 6 months. I encouraged her to keep going to her GI appointments and go see urology as well.

## 2016-01-05 NOTE — Progress Notes (Signed)
   CC: Patient is c/o L flank pain.   HPI:  [name] is a 40 y.o. F with a PMHx of conditions listed below presenting to the clinic c/o L flank pain. Obesity and incidental findings on imaging were also discussed. Please see problem based charting for the status of the patient's current and chronic medical conditions.   Past Medical History:  Diagnosis Date  . Asthma    mild by PFT's 03/16/1998  . Cervical lymphadenopathy    biopsy negativeMarvetta Gibbons)  . Complete spontaneous abortion    x2  . GERD (gastroesophageal reflux disease)   . History of abnormal Pap smear    s/p cryo( Dr. Wiliam Ke)  . HSV (herpes simplex virus) anogenital infection   . Hypertension   . Kidney stone   . Normal vaginal delivery    x3  . Sciatica 01/12/06  . 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   . Vaginal Pap smear, abnormal     Review of Systems:  Pertinent positives mentioned in HPI. Remainder of all ROS negative.   Physical Exam:  Vitals:   01/02/16 1349  BP: 111/69  Pulse: 87  Temp: 98.2 F (36.8 C)  TempSrc: Oral  SpO2: 100%  Weight: 78.6 kg (173 lb 4.8 oz)  Height: 5\' 4"  (1.626 m)   Physical Exam  Constitutional: She is oriented to person, place, and time. She appears well-developed and well-nourished. No distress.  HENT:  Head: Normocephalic and atraumatic.  Mouth/Throat: Oropharynx is clear and moist.  Eyes: EOM are normal.  Neck: Neck supple. No tracheal deviation present.  Cardiovascular: Normal rate, regular rhythm and intact distal pulses.   Pulmonary/Chest: Effort normal and breath sounds normal. No respiratory distress.  Abdominal: Soft. Bowel sounds are normal. She exhibits no distension. There is no tenderness.  Genitourinary:  Genitourinary Comments: +CVA tenderness on the left   Musculoskeletal: Normal range of motion.  She exhibits no edema.  Neurological: She is alert and oriented to person, place, and time.  Skin: Skin is warm and dry.    Assessment & Plan:   See Encounters Tab for problem based charting.  Patient seen with Dr. 

## 2016-01-06 ENCOUNTER — Telehealth: Payer: Self-pay | Admitting: Internal Medicine

## 2016-01-06 NOTE — Telephone Encounter (Signed)
Pt returning your phone call

## 2016-01-06 NOTE — Progress Notes (Signed)
Internal Medicine Clinic Attending  I saw and evaluated the patient.  I personally confirmed the key portions of the history and exam documented by Dr. Loney Loh and I reviewed pertinent patient test results.  The assessment, diagnosis, and plan were formulated together and I agree with the documentation in the resident's note. Intermittent hematuria is likely explained by nephrolithiasis, however given her last 2 negative CT scans and urinary symptoms of inability to empty bladder we will refer to Urology for consideration of cystoscopy.

## 2016-01-06 NOTE — Addendum Note (Signed)
Addended by: John Giovanni on: 01/06/2016 01:46 PM   Modules accepted: Orders

## 2016-01-07 NOTE — Telephone Encounter (Signed)
Pt states she is returning call to Dr Loney Loh

## 2016-02-04 ENCOUNTER — Ambulatory Visit (INDEPENDENT_AMBULATORY_CARE_PROVIDER_SITE_OTHER): Payer: Medicaid Other | Admitting: *Deleted

## 2016-02-04 DIAGNOSIS — Z23 Encounter for immunization: Secondary | ICD-10-CM | POA: Diagnosis present

## 2016-03-24 ENCOUNTER — Ambulatory Visit: Payer: Medicaid Other

## 2016-03-25 ENCOUNTER — Ambulatory Visit: Payer: Medicaid Other

## 2016-03-27 ENCOUNTER — Ambulatory Visit (INDEPENDENT_AMBULATORY_CARE_PROVIDER_SITE_OTHER): Payer: Medicaid Other | Admitting: Internal Medicine

## 2016-03-27 VITALS — BP 132/78 | HR 92 | Temp 98.5°F | Wt 175.8 lb

## 2016-03-27 DIAGNOSIS — M069 Rheumatoid arthritis, unspecified: Secondary | ICD-10-CM | POA: Diagnosis not present

## 2016-03-27 DIAGNOSIS — L659 Nonscarring hair loss, unspecified: Secondary | ICD-10-CM | POA: Diagnosis present

## 2016-03-27 DIAGNOSIS — R202 Paresthesia of skin: Secondary | ICD-10-CM

## 2016-03-27 DIAGNOSIS — Z8739 Personal history of other diseases of the musculoskeletal system and connective tissue: Secondary | ICD-10-CM

## 2016-03-27 DIAGNOSIS — R200 Anesthesia of skin: Secondary | ICD-10-CM

## 2016-03-27 NOTE — Patient Instructions (Signed)
Ms. Birenbaum,  I would like to get you set up to have nerve conduction studies done on your left wrist. I think it is most likely that you have carpal tunnel in that left wrist. Continue to use the brace at night.   We will give you call to set up the appointment.

## 2016-03-27 NOTE — Assessment & Plan Note (Signed)
Also has complaints of hair loss for the past 3-4 years. Notes generalized thinning. No rashes or itching.   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 No rashes or erythema present.    A/P Likely secondary to her RA. Does not appear to be correlated since starting [place].

## 2016-03-27 NOTE — Progress Notes (Signed)
Medicine attending: Medical history, presenting problems, physical findings, and medications, reviewed with resident physician Dr Windle Guard the day of the patient visit and I concur with his evaluation and management plan. Lady with RA on Humira. Hx of R carpal tunnel. Now with paresthesia of left hand. Wearing a brace. We will refer for nerve conduction study.

## 2016-03-27 NOTE — Progress Notes (Signed)
   CC: Left Hand Numbness/Hair loss  HPI:  [name] is a 41 y.o. female with a past medical history listed below here today with complaints of left hand numbness/tingling/swelling and hair loss. She reports today that she has been having ongoing nocturnal left hand swelling for the past 6 months. This occurs 1-2 hours after lying down to go to bed at night. It is accompanied by numbness in her left hand and tingling that extends just past her wrist and stops. Denies any weakness, neck pain, shooting pain. Occasionally wakes her up at night. Symptoms are improved in the morning but not completely resolved. She established with Rheumatology for RA in December and was started on Humera at that time. Reports the rest of her RA symptoms have improved and was expecting this to improve as well but symptoms are unchanged from prior. She is right hand dominant and uses her right hand for most things. Denies any repetitive motions with her left hand. No trauma or other injuries. Reports she had carpal tunnel in her right hand in the past. Does have a left wrist brace that she uses every night.  Also has complaints of hair loss for the past 3-4 years. Notes generalized thinning. No rashes or itching.   Past Medical History:  Diagnosis Date  . Asthma    mild by PFT's 03/16/1998  . Cervical lymphadenopathy    biopsy negativeMarvetta Gibbons)  . Complete spontaneous abortion    x2  . GERD (gastroesophageal reflux disease)   . History of abnormal Pap smear    s/p cryo( Dr. Wiliam Ke)  . HSV (herpes simplex virus) anogenital infection   . Hypertension   . Kidney stone   . Normal vaginal delivery    x3  . Sciatica 01/12/06  . 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   . Vaginal Pap smear, abnormal     Review of Systems:   Negative except as noted in  HPI  Physical Exam:  Vitals:   03/27/16 1540  BP: 132/78  Pulse: 92  Temp: 98.5 F (36.9 C)  TempSrc: Oral  SpO2: 100%  Weight: 175 lb 12.8 oz (79.7 kg)   Physical Exam  Constitutional: She is oriented to person, place, and time and well-developed, well-nourished, and in no distress.  HENT:  Head: Normocephalic and atraumatic.  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 No rashes or erythema present.   Cardiovascular: Normal rate and regular rhythm.   Pulmonary/Chest: Effort normal and breath sounds normal.  Neurological: She is alert and oriented to person, place, and time.  Negative phalen's maneuver and tinel's sign. Normal strength and intact sensation in B/L UE.   Skin: Skin is warm and dry. No rash noted. No erythema.     Assessment & Plan:   See Encounters Tab for problem based charting.  Patient discussed with Dr. Cyndie Chime

## 2016-03-27 NOTE — Assessment & Plan Note (Signed)
She reports today that she has been having ongoing nocturnal left hand swelling for the past 6 months. This occurs 1-2 hours after lying down to go to bed at night. It is accompanied by numbness in her left hand and tingling that extends just past her wrist and stops. Denies any weakness, neck pain, shooting pain. Occasionally wakes her up at night. Symptoms are improved in the morning but not completely resolved. She established with Rheumatology for RA in December and was started on Humera at that time. Reports the rest of her RA symptoms have improved and was expecting this to improve as well but symptoms are unchanged from prior. She is right hand dominant and uses her right hand for most things. Denies any repetitive motions with her left hand. No trauma or other injuries. Reports she had carpal tunnel in her right hand in the past. Does have a left wrist brace that she uses every night.  A/P Likely carpal tunnel of her left wrist. Will refer for nerve conduction studies. Continue left wrist brace qhs.

## 2016-03-30 NOTE — Addendum Note (Signed)
Addended by: Hollie Salk on: 03/30/2016 03:41 PM   Modules accepted: Orders

## 2016-04-06 ENCOUNTER — Encounter: Payer: Self-pay | Admitting: Internal Medicine

## 2016-05-13 ENCOUNTER — Encounter: Payer: Self-pay | Admitting: Internal Medicine

## 2016-05-13 ENCOUNTER — Ambulatory Visit (INDEPENDENT_AMBULATORY_CARE_PROVIDER_SITE_OTHER): Payer: Medicaid Other | Admitting: Internal Medicine

## 2016-05-13 VITALS — BP 120/67 | HR 79 | Temp 98.6°F | Ht 64.0 in | Wt 178.0 lb

## 2016-05-13 DIAGNOSIS — R05 Cough: Secondary | ICD-10-CM | POA: Diagnosis not present

## 2016-05-13 DIAGNOSIS — J3489 Other specified disorders of nose and nasal sinuses: Secondary | ICD-10-CM | POA: Diagnosis not present

## 2016-05-13 DIAGNOSIS — J029 Acute pharyngitis, unspecified: Secondary | ICD-10-CM

## 2016-05-13 NOTE — Patient Instructions (Signed)
Your sore throat is likely from post nasal drip or an early viral infection.   Take tylenol for pain. Use flonase twice a day for next few days and zyrtec daily.  Use humidifier.   Do salt water gargles for sore throat. You can also get over the counter Cloraseptic spray for sore throat.   Follow up in 1 week if you are not feeling better.

## 2016-05-13 NOTE — Assessment & Plan Note (Signed)
Sore throat is likely 2/2 to post nasal drip. Not using zyrtec or flonase every day. This could also be early viral infection. Afebrile. CENTOR criteria is zero for strep pharyngitis so no further testing is needed for her.   Will do supportive care with tylenol and chloraseptic spray for sore throat, use flonase BID and zyrtec daily along with salt water gargling and humidifier.   F/up if not better in 1 week.

## 2016-05-13 NOTE — Progress Notes (Signed)
   CC: sore throat  HPI:  Sue [name] is a 41 y.o. with PMH as listed below is here with sore throat since yesterday with post nasal drip. She has hx of post nasal drip, supposed to be on zyrtec and flonase but uses them infrequently. Started having right sided sore throat, feels like burning in her throat, different from her GERD. Also has increased post nasal drip and nasal congestion along with mild coughing. No fever, myalgia, sinus pressure, n/v, diarrhea, or any other symptom currently. No sick contacts.     Past Medical History:  Diagnosis Date  . Asthma    mild by PFT's 03/16/1998  . Cervical lymphadenopathy    biopsy negativeMarvetta Gibbons)  . Complete spontaneous abortion    x2  . GERD (gastroesophageal reflux disease)   . History of abnormal Pap smear    s/p cryo( Dr. Wiliam Ke)  . HSV (herpes simplex virus) anogenital infection   . Hypertension   . Kidney stone   . Normal vaginal delivery    x3  . Sciatica 01/12/06  . 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   . Vaginal Pap smear, abnormal     Review of Systems:   Review of Systems  Constitutional: Negative for chills and fever.  HENT: Positive for congestion and sore throat. Negative for ear pain.   Respiratory: Positive for cough. Negative for sputum production, shortness of breath and wheezing.   Cardiovascular: Negative for chest pain and palpitations.  Gastrointestinal: Negative for heartburn.  Genitourinary: Negative for dysuria.  Musculoskeletal: Negative for myalgias.  Skin: Negative for rash.     Physical Exam:  Vitals:   05/13/16 1524  BP: 120/67  Pulse: 79  Temp: 98.6 F (37 C)  TempSrc: Oral  SpO2: 100%  Weight: 178 lb (80.7 kg)  Height: 5\' 4"  (1.626 m)   Physical Exam  Constitutional: She is oriented to person, place, and time. She appears  well-developed and well-nourished. No distress.  HENT:  Head: Normocephalic and atraumatic.  Right Ear: External ear normal.  Left Ear: External ear normal.  Nose: Nose normal.  Mouth/Throat: Oropharynx is clear and moist.  Normal tympanic membranes b/l. No oral or oropharyngeal exudates. Has mild erythema on posterior oropharynx.   Has some tenderness over her right throat but no lymph nodes are seen.   Neck: Neck supple.  Cardiovascular: Normal rate and regular rhythm.  Exam reveals no gallop and no friction rub.   No murmur heard. Respiratory: Effort normal and breath sounds normal. No respiratory distress. She has no wheezes.  GI: Soft. Bowel sounds are normal. She exhibits no distension.  Neurological: She is alert and oriented to person, place, and time.  Skin: She is not diaphoretic.    Assessment & Plan:   See Encounters Tab for problem based charting.  Patient discussed with Dr. 

## 2016-05-15 NOTE — Progress Notes (Signed)
Internal Medicine Clinic Attending  Case discussed with Dr. Ahmed at the time of the visit.  We reviewed the resident's history and exam and pertinent patient test results.  I agree with the assessment, diagnosis, and plan of care documented in the resident's note. 

## 2016-05-22 ENCOUNTER — Ambulatory Visit (INDEPENDENT_AMBULATORY_CARE_PROVIDER_SITE_OTHER): Payer: Medicaid Other | Admitting: Neurology

## 2016-05-22 ENCOUNTER — Encounter (INDEPENDENT_AMBULATORY_CARE_PROVIDER_SITE_OTHER): Payer: Self-pay | Admitting: Neurology

## 2016-05-22 DIAGNOSIS — Z0289 Encounter for other administrative examinations: Secondary | ICD-10-CM

## 2016-05-22 DIAGNOSIS — R202 Paresthesia of skin: Secondary | ICD-10-CM

## 2016-05-22 NOTE — Procedures (Signed)
Full Name: Sue [name] Gender: Female MRN #: [id number] Date of Birth: March 08, 1976    Visit Date: 05/22/2016 10:07 Age: 41 Years 4 Months Old Examining Physician: Levert Feinstein, MD  History: 41 years old right-handed female with history of rheumatoid arthritis complains of wrist pain, intermittent bilateral hands paresthesia  Summary of the test: Nerve conduction study: Bilateral median mixed response were 0.3 milliseconds prolonged compared to ipsilateral ulnar mixed response. Bilateral median sensory and motor responses were within normal limits.  Electromyography: Selected needle examination of left upper extremity muscles showed no significant abnormality.  Conclusion: This is essentially a normal study. There is no electrodiagnostic evidence of bilateral upper extremity neuropathy or left cervical radiculopathy.    ------------------------------- Levert Feinstein, M.D.  [facility] [address] [place], [place] [address] Tel: [phone] Fax: [phone]        [facility]    Nerve / Sites Rec. Site Peak Lat Ref.  Amp Ref. Segments Distance Peak Diff Ref.    ms ms V V  cm ms ms  R Median - Orthodromic (Dig II, Mid palm)     Dig II Wrist 3.3 ?3.4 13 ?10 Dig II - Wrist 13    R Median, Ulnar - Transcarpal comparison     Median Palm Wrist 2.4 ?2.2 40 ?35 Median Palm - Wrist 8       Ulnar Palm Wrist 2.1 ?2.2 6 ?12 Ulnar Palm - Wrist 8          Median Palm - Ulnar Palm  0.4 ?0.4  R Ulnar - Orthodromic, (Dig V, Mid palm)     Dig V Wrist 2.8 ?3.1 8 ?5 Dig V - Wrist 11    L Median - Orthodromic (Dig II, Mid palm)     Dig II Wrist 3.1 ?3.4 22 ?10 Dig II - Wrist 13    L Median, Ulnar - Transcarpal comparison     Median Palm Wrist 2.3 ?2.2 72 ?35 Median Palm - Wrist 8       Ulnar Palm Wrist 2.0 ?2.2 9 ?12 Ulnar Palm - Wrist 8          Median Palm - Ulnar Palm  0.3 ?0.4  L Ulnar - Orthodromic, (Dig V, Mid palm)     Dig V Wrist 2.7 ?3.1 6 ?5 Dig V - Wrist 11                 MNC    Nerve / Sites Rec. Site Latency Ref. Amplitude Ref. Rel Amp Segments Distance Velocity Ref. Area    ms ms mV mV %  cm m/s m/s mVms  R Median - APB     Wrist APB 4.2 ?4.4 5.3 ?4.0 100 Wrist - APB 7   21.7     Upper arm APB 7.9  5.1  95.1 Upper arm - Wrist 21 56 ?49 17.8  R Ulnar - ADM     Wrist ADM 2.8 ?3.3 10.5 ?6.0 100 Wrist - ADM 7   36.6     B.Elbow ADM 5.6  9.9  94.2 B.Elbow - Wrist 16 57 ?49 35.9     A.Elbow ADM 7.8  9.4  95.1 A.Elbow - B.Elbow 12 55 ?49 36.9         A.Elbow - Wrist      L Median - APB     Wrist APB 3.4 ?4.4 8.8 ?4.0 100 Wrist - APB 7   28.8     Upper  arm APB 7.4  8.7  98.5 Upper arm - Wrist 21 53 ?49 27.5  L Ulnar - ADM     Wrist ADM 2.7 ?3.3 10.6 ?6.0 100 Wrist - ADM 7   33.4     B.Elbow ADM 5.5  9.7  91.6 B.Elbow - Wrist 16 56 ?49 34.4     A.Elbow ADM 7.7  9.2  95.1 A.Elbow - B.Elbow 12 55 ?49 33.6         A.Elbow - Wrist                 F  Wave    Nerve F Lat Ref.   ms ms  R Ulnar - ADM 29.9 ?32.0  L Ulnar - ADM 28.2 ?32.0         EMG full       EMG Summary Table    Spontaneous MUAP Recruitment  Muscle IA Fib PSW Fasc Other Amp Dur. Poly Pattern  R. Pronator teres Normal None None None _______ Normal Normal Normal Normal  R. First dorsal interosseous Normal None None None _______ Normal Normal Normal Normal  R. Abductor pollicis brevis Normal None None None _______ Normal Normal Normal Normal  R. Biceps brachii Normal None None None _______ Normal Normal Normal Normal  R. Deltoid Normal None None None _______ Normal Normal Normal Normal  R. Extensor digitorum communis Normal None None None _______ Normal Normal Normal Normal

## 2016-08-11 ENCOUNTER — Ambulatory Visit (INDEPENDENT_AMBULATORY_CARE_PROVIDER_SITE_OTHER): Payer: Medicaid Other

## 2016-08-11 ENCOUNTER — Encounter: Payer: Self-pay | Admitting: Obstetrics

## 2016-08-11 VITALS — BP 149/96 | HR 85 | Wt 174.0 lb

## 2016-08-11 DIAGNOSIS — Z3200 Encounter for pregnancy test, result unknown: Secondary | ICD-10-CM

## 2016-08-11 DIAGNOSIS — Z3201 Encounter for pregnancy test, result positive: Secondary | ICD-10-CM

## 2016-08-11 LAB — POCT URINE PREGNANCY: Preg Test, Ur: POSITIVE — AB

## 2016-08-11 NOTE — Progress Notes (Signed)
Patient presents for UPT. Pregnancy Test is POSITIVE. LMP unknown, she thinks it was at the end of April on or before 06/29/16.

## 2016-09-05 ENCOUNTER — Inpatient Hospital Stay (HOSPITAL_COMMUNITY)
Admission: AD | Admit: 2016-09-05 | Discharge: 2016-09-05 | Disposition: A | Discharge status: Home / Self Care | Payer: Medicaid Other | Source: Ambulatory Visit | Attending: Family Medicine | Admitting: Family Medicine

## 2016-09-05 ENCOUNTER — Encounter (HOSPITAL_COMMUNITY): Payer: Self-pay

## 2016-09-05 DIAGNOSIS — O26891 Other specified pregnancy related conditions, first trimester: Secondary | ICD-10-CM | POA: Insufficient documentation

## 2016-09-05 DIAGNOSIS — Z3A09 9 weeks gestation of pregnancy: Secondary | ICD-10-CM | POA: Insufficient documentation

## 2016-09-05 DIAGNOSIS — O9989 Other specified diseases and conditions complicating pregnancy, childbirth and the puerperium: Secondary | ICD-10-CM

## 2016-09-05 DIAGNOSIS — Z880 Allergy status to penicillin: Secondary | ICD-10-CM | POA: Diagnosis not present

## 2016-09-05 DIAGNOSIS — R350 Frequency of micturition: Secondary | ICD-10-CM | POA: Diagnosis present

## 2016-09-05 HISTORY — DX: Rheumatoid arthritis, unspecified: M06.9

## 2016-09-05 LAB — URINALYSIS, ROUTINE W REFLEX MICROSCOPIC
Bilirubin Urine: NEGATIVE
Glucose, UA: NEGATIVE mg/dL
Hgb urine dipstick: NEGATIVE
Ketones, ur: NEGATIVE mg/dL
Leukocytes, UA: NEGATIVE
Nitrite: NEGATIVE
Protein, ur: NEGATIVE mg/dL
Specific Gravity, Urine: 1.021 (ref 1.005–1.030)
pH: 7 (ref 5.0–8.0)

## 2016-09-05 LAB — WET PREP, GENITAL
Clue Cells Wet Prep HPF POC: NONE SEEN
Sperm: NONE SEEN
Trich, Wet Prep: NONE SEEN
Yeast Wet Prep HPF POC: NONE SEEN

## 2016-09-05 MED ORDER — PHENAZOPYRIDINE HCL 200 MG PO TABS: 200.0000 mg | ORAL_TABLET | Freq: Three times a day (TID) | ORAL | 0 refills | Status: DC

## 2016-09-05 MED ORDER — PHENAZOPYRIDINE HCL 200 MG PO TABS: 200.0000 mg | ORAL_TABLET | Freq: Once | ORAL | 0 refills | Status: AC

## 2016-09-05 MED ORDER — PHENAZOPYRIDINE HCL 100 MG PO TABS
200.0000 mg | ORAL_TABLET | Freq: Once | ORAL | Status: AC
  Administered 2016-09-05: 200 mg via ORAL
  Filled 2016-09-05: qty 2

## 2016-09-05 NOTE — Discharge Instructions (Signed)

## 2016-09-05 NOTE — MAU Provider Note (Signed)
History   [id number] @ 9.5 wks in with urinary freq and pressure since yesterday. Denies vag bleeding or ROM.  CSN: [id number]  Arrival date & time 09/05/16  1445   None     Chief Complaint  Patient presents with  . Urinary Frequency  . Abdominal Pain    HPI  Past Medical History:  Diagnosis Date  . Asthma    mild by PFT's 03/16/1998  . Cervical lymphadenopathy    biopsy negativeMarvetta Gibbons)  . Complete spontaneous abortion    x2  . GERD (gastroesophageal reflux disease)   . History of abnormal Pap smear    s/p cryo( Dr. Wiliam Ke)  . HSV (herpes simplex virus) anogenital infection   . Hypertension   . Kidney stone   . Normal vaginal delivery    x3  . Rheumatoid arthritis (HCC)   . Sciatica 01/12/06  . 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   . Vaginal Pap smear, abnormal     Past Surgical History:  Procedure Laterality Date  . biopsy of lymph node in R neck  2007.  Marland Kitchen LEEP      Family History  Problem Relation Age of Onset  . Ulcerative colitis Mother   . Hypertension Mother   . Diabetes Father   . Heart attack Father   . Hypertension Father   . Diabetes Sister   . Heart attack Maternal Grandmother     Social History  Substance Use Topics  . Smoking status: Never Smoker  . Smokeless tobacco: Never Used  . Alcohol use 0.0 oz/week     Comment: occasional beer and liquor on weekends    OB History    Gravida Para Term Preterm AB Living   7 3 2 1 3 3    SAB TAB Ectopic Multiple Live Births   3       3      Review of Systems  Constitutional: Negative.   HENT: Negative.   Eyes: Negative.   Respiratory: Negative.   Cardiovascular: Negative.   Gastrointestinal: Negative.   Endocrine: Negative.   Genitourinary: Positive for frequency and urgency.  Musculoskeletal: Negative.   Skin: Negative.    Allergic/Immunologic: Negative.   Neurological: Negative.   Hematological: Negative.   Psychiatric/Behavioral: Negative.     Allergies  Sulfamethoxazole-trimethoprim and Amoxicillin  Home Medications    BP 124/79 (BP Location: Right Arm)   Pulse 78   Temp 97.5 F (36.4 C)   Resp 18   Ht 5\' 4"  (1.626 m)   LMP 06/29/2016 (Approximate)   Physical Exam  Constitutional: She is oriented to person, place, and time. She appears well-developed and well-nourished.  HENT:  Head: Normocephalic.  Eyes: Pupils are equal, round, and reactive to light.  Neck: Normal range of motion.  Cardiovascular: Normal rate, regular rhythm, normal heart sounds and intact distal pulses.   Pulmonary/Chest: Effort normal and breath sounds normal.  Abdominal: Soft. Bowel sounds are normal.  Genitourinary: Vagina normal and uterus normal.  Musculoskeletal: Normal range of motion.  Neurological: She is alert and oriented to person, place, and time. She has normal reflexes.  Skin: Skin is warm and dry.  Psychiatric: She has a normal mood and affect. Her behavior is normal. Judgment and thought content normal.    MAU Course  Procedures (including critical care time)  Labs Reviewed  WET PREP, GENITAL  URINALYSIS, ROUTINE W REFLEX MICROSCOPIC  GC/CHLAMYDIA PROBE AMP ([place]) NOT AT [facility]   No results found.   1. Urinary frequency       MDM  VSS, abd soft non tender. Wet prep, GC and chla done. Will treat with pyridium and d/c home to f/u as needed.

## 2016-09-05 NOTE — MAU Note (Signed)
Patient presents with urinary frequency and not feeling like voiding sufficiently x 3 days.

## 2016-09-06 ENCOUNTER — Other Ambulatory Visit: Payer: Self-pay | Admitting: Obstetrics

## 2016-09-07 LAB — GC/CHLAMYDIA PROBE AMP (~~LOC~~) NOT AT ARMC
Chlamydia: NEGATIVE
Neisseria Gonorrhea: NEGATIVE

## 2016-09-14 ENCOUNTER — Encounter: Payer: Medicaid Other | Admitting: Certified Nurse Midwife

## 2016-09-15 ENCOUNTER — Other Ambulatory Visit (HOSPITAL_COMMUNITY)
Admission: RE | Admit: 2016-09-15 | Discharge: 2016-09-15 | Disposition: A | Discharge status: Home / Self Care | Payer: Medicaid Other | Source: Ambulatory Visit | Attending: Obstetrics & Gynecology | Admitting: Obstetrics & Gynecology

## 2016-09-15 ENCOUNTER — Encounter: Payer: Self-pay | Admitting: Obstetrics & Gynecology

## 2016-09-15 ENCOUNTER — Ambulatory Visit (INDEPENDENT_AMBULATORY_CARE_PROVIDER_SITE_OTHER): Payer: Medicaid Other | Admitting: Obstetrics & Gynecology

## 2016-09-15 VITALS — BP 138/79 | HR 85 | Wt 174.1 lb

## 2016-09-15 DIAGNOSIS — Z3689 Encounter for other specified antenatal screening: Secondary | ICD-10-CM | POA: Diagnosis not present

## 2016-09-15 DIAGNOSIS — O10919 Unspecified pre-existing hypertension complicating pregnancy, unspecified trimester: Secondary | ICD-10-CM | POA: Diagnosis not present

## 2016-09-15 DIAGNOSIS — Z3A11 11 weeks gestation of pregnancy: Secondary | ICD-10-CM | POA: Diagnosis not present

## 2016-09-15 DIAGNOSIS — O09529 Supervision of elderly multigravida, unspecified trimester: Secondary | ICD-10-CM | POA: Diagnosis not present

## 2016-09-15 DIAGNOSIS — O10911 Unspecified pre-existing hypertension complicating pregnancy, first trimester: Secondary | ICD-10-CM | POA: Diagnosis not present

## 2016-09-15 DIAGNOSIS — O09521 Supervision of elderly multigravida, first trimester: Secondary | ICD-10-CM

## 2016-09-15 MED ORDER — ASPIRIN EC 81 MG PO TBEC: 81.0000 mg | DELAYED_RELEASE_TABLET | Freq: Every day | ORAL | 2 refills | Status: DC

## 2016-09-15 NOTE — Patient Instructions (Signed)
Hypertension During Pregnancy Hypertension, commonly called high blood pressure, is when the force of blood pumping through your arteries is too strong. Arteries are blood vessels that carry blood from the heart throughout the body. Hypertension during pregnancy can cause problems for you and your baby. Your baby may be born early (prematurely) or may not weigh as much as he or she should at birth. Very bad cases of hypertension during pregnancy can be life-threatening. Different types of hypertension can occur during pregnancy. These include:  Chronic hypertension. This happens when: ? You have hypertension before pregnancy and it continues during pregnancy. ? You develop hypertension before you are [redacted] weeks pregnant, and it continues during pregnancy.  Gestational hypertension. This is hypertension that develops after the 20th week of pregnancy.  Preeclampsia, also called toxemia of pregnancy. This is a very serious type of hypertension that develops only during pregnancy. It affects the whole body, and it can be very dangerous for you and your baby.  Gestational hypertension and preeclampsia usually go away within 6 weeks after your baby is born. Women who have hypertension during pregnancy have a greater chance of developing hypertension later in life or during future pregnancies. What are the causes? The exact cause of hypertension is not known. What increases the risk? There are certain factors that make it more likely for you to develop hypertension during pregnancy. These include:  Having hypertension during a previous pregnancy or prior to pregnancy.  Being overweight.  Being older than age 40.  Being pregnant for the first time or being pregnant with more than one baby.  Becoming pregnant using fertilization methods such as IVF (in vitro fertilization).  Having diabetes, kidney problems, or systemic lupus erythematosus.  Having a family history of hypertension.  What are the  signs or symptoms? Chronic hypertension and gestational hypertension rarely cause symptoms. Preeclampsia causes symptoms, which may include:  Increased protein in your urine. Your health care provider will check for this at every visit before you give birth (prenatal visit).  Severe headaches.  Sudden weight gain.  Swelling of the hands, face, legs, and feet.  Nausea and vomiting.  Vision problems, such as blurred or double vision.  Numbness in the face, arms, legs, and feet.  Dizziness.  Slurred speech.  Sensitivity to bright lights.  Abdominal pain.  Convulsions.  How is this diagnosed? You may be diagnosed with hypertension during a routine prenatal exam. At each prenatal visit, you may:  Have a urine test to check for high amounts of protein in your urine.  Have your blood pressure checked. A blood pressure reading is recorded as two numbers, such as "120 over 80" (or 120/80). The first ("top") number is called the systolic pressure. It is a measure of the pressure in your arteries when your heart beats. The second ("bottom") number is called the diastolic pressure. It is a measure of the pressure in your arteries as your heart relaxes between beats. Blood pressure is measured in a unit called mm Hg. A normal blood pressure reading is: ? Systolic: below 120. ? Diastolic: below 80.  The type of hypertension that you are diagnosed with depends on your test results and when your symptoms developed.  Chronic hypertension is usually diagnosed before 20 weeks of pregnancy.  Gestational hypertension is usually diagnosed after 20 weeks of pregnancy.  Hypertension with high amounts of protein in the urine is diagnosed as preeclampsia.  Blood pressure measurements that stay above 160 systolic, or above 110 diastolic, are   signs of severe preeclampsia.  How is this treated? Treatment for hypertension during pregnancy varies depending on the type of hypertension you have and how  serious it is.  If you take medicines called ACE inhibitors to treat chronic hypertension, you may need to switch medicines. ACE inhibitors should not be taken during pregnancy.  If you have gestational hypertension, you may need to take blood pressure medicine.  If you are at risk for preeclampsia, your health care provider may recommend that you take a low-dose aspirin every day to prevent high blood pressure during your pregnancy.  If you have severe preeclampsia, you may need to be hospitalized so you and your baby can be monitored closely. You may also need to take medicine (magnesium sulfate) to prevent seizures and to lower blood pressure. This medicine may be given as an injection or through an IV tube.  In some cases, if your condition gets worse, you may need to deliver your baby early.  Follow these instructions at home: Eating and drinking  Drink enough fluid to keep your urine clear or pale yellow.  Eat a healthy diet that is low in salt (sodium). Do not add salt to your food. Check food labels to see how much sodium a food or beverage contains. Lifestyle  Do not use any products that contain nicotine or tobacco, such as cigarettes and e-cigarettes. If you need help quitting, ask your health care provider.  Do not use alcohol.  Avoid caffeine.  Avoid stress as much as possible. Rest and get plenty of sleep. General instructions  Take over-the-counter and prescription medicines only as told by your health care provider.  While lying down, lie on your left side. This keeps pressure off your baby.  While sitting or lying down, raise (elevate) your feet. Try putting some pillows under your lower legs.  Exercise regularly. Ask your health care provider what kinds of exercise are best for you.  Keep all prenatal and follow-up visits as told by your health care provider. This is important. Contact a health care provider if:  You have symptoms that your health care  provider told you may require more treatment or monitoring, such as: ? Fever. ? Vomiting. ? Headache. Get help right away if:  You have severe abdominal pain or vomiting that does not get better with treatment.  You suddenly develop swelling in your hands, ankles, or face.  You gain 4 lbs (1.8 kg) or more in 1 week.  You develop vaginal bleeding, or you have blood in your urine.  You do not feel your baby moving as much as usual.  You have blurred or double vision.  You have muscle twitching or sudden tightening (spasms).  You have shortness of breath.  Your lips or fingernails turn blue. This information is not intended to replace advice given to you by your health care provider. Make sure you discuss any questions you have with your health care provider. Document Released: 11/11/2010 Document Revised: 09/13/2015 Document Reviewed: 08/09/2015 [employer] Interactive Patient Education  2018 [employer] First Trimester of Pregnancy The first trimester of pregnancy is from week 1 until the end of week 13 (months 1 through 3). A week after a sperm fertilizes an egg, the egg will implant on the wall of the uterus. This embryo will begin to develop into a baby. Genes from you and your partner will form the baby. The female genes will determine whether the baby will be a boy or a girl. At 6-8   weeks, the eyes and face will be formed, and the heartbeat can be seen on ultrasound. At the end of 12 weeks, all the baby's organs will be formed. Now that you are pregnant, you will want to do everything you can to have a healthy baby. Two of the most important things are to get good prenatal care and to follow your health care provider's instructions. Prenatal care is all the medical care you receive before the baby's birth. This care will help prevent, find, and treat any problems during the pregnancy and childbirth. Body changes during your first trimester Your body goes through many changes during  pregnancy. The changes vary from woman to woman.  You may gain or lose a couple of pounds at first.  You may feel sick to your stomach (nauseous) and you may throw up (vomit). If the vomiting is uncontrollable, call your health care provider.  You may tire easily.  You may develop headaches that can be relieved by medicines. All medicines should be approved by your health care provider.  You may urinate more often. Painful urination may mean you have a bladder infection.  You may develop heartburn as a result of your pregnancy.  You may develop constipation because certain hormones are causing the muscles that push stool through your intestines to slow down.  You may develop hemorrhoids or swollen veins (varicose veins).  Your breasts may begin to grow larger and become tender. Your nipples may stick out more, and the tissue that surrounds them (areola) may become darker.  Your gums may bleed and may be sensitive to brushing and flossing.  Dark spots or blotches (chloasma, mask of pregnancy) may develop on your face. This will likely fade after the baby is born.  Your menstrual periods will stop.  You may have a loss of appetite.  You may develop cravings for certain kinds of food.  You may have changes in your emotions from day to day, such as being excited to be pregnant or being concerned that something may go wrong with the pregnancy and baby.  You may have more vivid and strange dreams.  You may have changes in your hair. These can include thickening of your hair, rapid growth, and changes in texture. Some women also have hair loss during or after pregnancy, or hair that feels dry or thin. Your hair will most likely return to normal after your baby is born.  What to expect at prenatal visits During a routine prenatal visit:  You will be weighed to make sure you and the baby are growing normally.  Your blood pressure will be taken.  Your abdomen will be measured to  track your baby's growth.  The fetal heartbeat will be listened to between weeks 10 and 14 of your pregnancy.  Test results from any previous visits will be discussed.  Your health care provider may ask you:  How you are feeling.  If you are feeling the baby move.  If you have had any abnormal symptoms, such as leaking fluid, bleeding, severe headaches, or abdominal cramping.  If you are using any tobacco products, including cigarettes, chewing tobacco, and electronic cigarettes.  If you have any questions.  Other tests that may be performed during your first trimester include:  Blood tests to find your blood type and to check for the presence of any previous infections. The tests will also be used to check for low iron levels (anemia) and protein on red blood cells (Rh antibodies). Depending   on your risk factors, or if you previously had diabetes during pregnancy, you may have tests to check for high blood sugar that affects pregnant women (gestational diabetes).  Urine tests to check for infections, diabetes, or protein in the urine.  An ultrasound to confirm the proper growth and development of the baby.  Fetal screens for spinal cord problems (spina bifida) and Down syndrome.  HIV (human immunodeficiency virus) testing. Routine prenatal testing includes screening for HIV, unless you choose not to have this test.  You may need other tests to make sure you and the baby are doing well.  Follow these instructions at home: Medicines  Follow your health care provider's instructions regarding medicine use. Specific medicines may be either safe or unsafe to take during pregnancy.  Take a prenatal vitamin that contains at least 600 micrograms (mcg) of folic acid.  If you develop constipation, try taking a stool softener if your health care provider approves. Eating and drinking  Eat a balanced diet that includes fresh fruits and vegetables, whole grains, good sources of protein  such as meat, eggs, or tofu, and low-fat dairy. Your health care provider will help you determine the amount of weight gain that is right for you.  Avoid raw meat and uncooked cheese. These carry germs that can cause birth defects in the baby.  Eating four or five small meals rather than three large meals a day may help relieve nausea and vomiting. If you start to feel nauseous, eating a few soda crackers can be helpful. Drinking liquids between meals, instead of during meals, also seems to help ease nausea and vomiting.  Limit foods that are high in fat and processed sugars, such as fried and sweet foods.  To prevent constipation: ? Eat foods that are high in fiber, such as fresh fruits and vegetables, whole grains, and beans. ? Drink enough fluid to keep your urine clear or pale yellow. Activity  Exercise only as directed by your health care provider. Most women can continue their usual exercise routine during pregnancy. Try to exercise for 30 minutes at least 5 days a week. Exercising will help you: ? Control your weight. ? Stay in shape. ? Be prepared for labor and delivery.  Experiencing pain or cramping in the lower abdomen or lower back is a good sign that you should stop exercising. Check with your health care provider before continuing with normal exercises.  Try to avoid standing for long periods of time. Move your legs often if you must stand in one place for a long time.  Avoid heavy lifting.  Wear low-heeled shoes and practice good posture.  You may continue to have sex unless your health care provider tells you not to. Relieving pain and discomfort  Wear a good support bra to relieve breast tenderness.  Take warm sitz baths to soothe any pain or discomfort caused by hemorrhoids. Use hemorrhoid cream if your health care provider approves.  Rest with your legs elevated if you have leg cramps or low back pain.  If you develop varicose veins in your legs, wear support  hose. Elevate your feet for 15 minutes, 3-4 times a day. Limit salt in your diet. Prenatal care  Schedule your prenatal visits by the twelfth week of pregnancy. They are usually scheduled monthly at first, then more often in the last 2 months before delivery.  Write down your questions. Take them to your prenatal visits.  Keep all your prenatal visits as told by your health care   provider. This is important. Safety  Wear your seat belt at all times when driving.  Make a list of emergency phone numbers, including numbers for family, friends, the hospital, and police and fire departments. General instructions  Ask your health care provider for a referral to a local prenatal education class. Begin classes no later than the beginning of month 6 of your pregnancy.  Ask for help if you have counseling or nutritional needs during pregnancy. Your health care provider can offer advice or refer you to specialists for help with various needs.  Do not use hot tubs, steam rooms, or saunas.  Do not douche or use tampons or scented sanitary pads.  Do not cross your legs for long periods of time.  Avoid cat litter boxes and soil used by cats. These carry germs that can cause birth defects in the baby and possibly loss of the fetus by miscarriage or stillbirth.  Avoid all smoking, herbs, alcohol, and medicines not prescribed by your health care provider. Chemicals in these products affect the formation and growth of the baby.  Do not use any products that contain nicotine or tobacco, such as cigarettes and e-cigarettes. If you need help quitting, ask your health care provider. You may receive counseling support and other resources to help you quit.  Schedule a dentist appointment. At home, brush your teeth with a soft toothbrush and be gentle when you floss. Contact a health care provider if:  You have dizziness.  You have mild pelvic cramps, pelvic pressure, or nagging pain in the abdominal  area.  You have persistent nausea, vomiting, or diarrhea.  You have a bad smelling vaginal discharge.  You have pain when you urinate.  You notice increased swelling in your face, hands, legs, or ankles.  You are exposed to fifth disease or chickenpox.  You are exposed to German measles (rubella) and have never had it. Get help right away if:  You have a fever.  You are leaking fluid from your vagina.  You have spotting or bleeding from your vagina.  You have severe abdominal cramping or pain.  You have rapid weight gain or loss.  You vomit blood or material that looks like coffee grounds.  You develop a severe headache.  You have shortness of breath.  You have any kind of trauma, such as from a fall or a car accident. Summary  The first trimester of pregnancy is from week 1 until the end of week 13 (months 1 through 3).  Your body goes through many changes during pregnancy. The changes vary from woman to woman.  You will have routine prenatal visits. During those visits, your health care provider will examine you, discuss any test results you may have, and talk with you about how you are feeling. This information is not intended to replace advice given to you by your health care provider. Make sure you discuss any questions you have with your health care provider. Document Released: 02/17/2001 Document Revised: 02/05/2016 Document Reviewed: 02/05/2016 [employer] Interactive Patient Education  2017 [employer]  

## 2016-09-15 NOTE — Progress Notes (Signed)
Subjective:   Caydance Kuehnle is a 41 y.o. [id number] at [redacted]w[redacted]d by LMP being seen today for her first obstetrical visit.  Her obstetrical history is significant for advanced maternal age and CHTN. Patient does not intend to breast feed. Pregnancy history fully reviewed.  Patient reports no complaints.  HISTORY: Obstetric History   G7   P3   T2   P1   A3   L3    SAB3   TAB0   Ectopic0   Multiple0   Live Births3     # Outcome Date GA Lbr Len/2nd Weight Sex Delivery Anes PTL Lv  7 Current           6 Term 07/06/05 [redacted]w[redacted]d  6 lb 12 oz (3.062 kg) M Vag-Spont None  LIV  5 SAB 2006 [redacted]w[redacted]d       DEC  4 Term 03/27/96 [redacted]w[redacted]d  7 lb 12 oz (3.515 kg) M Vag-Spont None  LIV  3 SAB 1996        DEC  2 Preterm 02/17/92 [redacted]w[redacted]d  3 lb 11 oz (1.673 kg) M Vag-Spont None  LIV  1 SAB 1992        DEC     Past Medical History:  Diagnosis Date  . Asthma    mild by PFT's 03/16/1998  . Cervical lymphadenopathy    biopsy negativeMarvetta Gibbons)  . Chronic hypertension   . Complete spontaneous abortion    x2  . GERD (gastroesophageal reflux disease)   . History of abnormal Pap smear    s/p cryo( Dr. Wiliam Ke)  . HSV (herpes simplex virus) anogenital infection   . Hypertension   . Kidney stone   . Normal vaginal delivery    x3  . Rheumatoid arthritis (HCC)   . Sciatica 01/12/06  . 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   . Vaginal Pap smear, abnormal    Past Surgical History:  Procedure Laterality Date  . biopsy of lymph node in R neck  2007.  Marland Kitchen LEEP     Family History  Problem Relation Age of Onset  . Ulcerative colitis Mother   . Hypertension Mother   . Diabetes Father   . Heart attack Father   . Hypertension Father   . Diabetes Sister   . Thyroid cancer Sister   . Heart attack Maternal Grandmother   . Stroke Maternal Grandmother    Social History    Substance Use Topics  . Smoking status: Never Smoker  . Smokeless tobacco: Never Used  . Alcohol use No     Comment: occasional beer and liquor on weekends   Allergies  Allergen Reactions  . Ciprofloxacin Swelling  . Clindamycin/Lincomycin Swelling  . Sulfamethoxazole-Trimethoprim Swelling    Swelling is of the lips. Swelling is of the lips.  . Amoxicillin Rash    Has patient had a PCN reaction causing immediate rash, facial/tongue/throat swelling, SOB or lightheadedness with hypotension: no Has patient had a PCN reaction causing severe rash involving mucus membranes or skin necrosis: pt had rash -- non severe  Has patient had a PCN reaction that required hospitalization: no Has patient had a PCN reaction occurring within the last 10 years: no If all of the above answers are "NO", then may proceed with Cephalosporin use.    Current Outpatient Prescriptions on File Prior to Visit  Medication Sig  Dispense Refill  . acetaminophen (TYLENOL) 500 MG tablet Take 1,000 mg by mouth every 6 (six) hours as needed for mild pain, moderate pain or headache.    Marland Kitchen amLODipine (NORVASC) 5 MG tablet take 1 tablet by mouth once daily 30 tablet 11  . cetirizine (ZYRTEC) 10 MG tablet Take 1 tablet (10 mg total) by mouth daily. (Patient taking differently: Take 10 mg by mouth daily as needed for allergies. ) 30 tablet 6  . albuterol (PROVENTIL HFA) 108 (90 BASE) MCG/ACT inhaler Inhale 2 puffs into the lungs every 6 (six) hours as needed for wheezing. (Patient not taking: Reported on 11/21/2015) 1 Inhaler 6  . Multiple Vitamin (MULITIVITAMIN WITH MINERALS) TABS Take 1 tablet by mouth daily.    . pantoprazole (PROTONIX) 40 MG tablet Take 40 mg by mouth daily.  1  . phenazopyridine (PYRIDIUM) 200 MG tablet Take 1 tablet (200 mg total) by mouth 3 (three) times daily. (Patient not taking: Reported on 09/15/2016) 9 tablet 0  . Probiotic Product (PROBIOTIC-10 PO) Take by mouth.    . valACYclovir (VALTREX) 500 MG  tablet take 1 tablet by mouth twice a day for 3 days (Patient not taking: Reported on 09/15/2016) 30 tablet PRN  . [DISCONTINUED] levonorgestrel-ethinyl estradiol (SEASONALE,INTROVALE,JOLESSA) 0.15-0.03 MG tablet Take 1 tablet by mouth daily. (Patient not taking: Reported on 07/27/2015) 1 Package 3  . [DISCONTINUED] loratadine (CLARITIN) 10 MG tablet Take 1 tablet (10 mg total) by mouth daily. (Patient not taking: Reported on 07/27/2015) 30 tablet 0   No current facility-administered medications on file prior to visit.      Exam   Vitals:   09/15/16 1011  BP: 138/79  Pulse: 85  Weight: 174 lb 1.6 oz (79 kg)   Fetal Heart Rate (bpm): +SONO   Uterus:     Pelvic Exam: Deferred   System: General: well-developed, well-nourished female in no acute distress   Breasts:  normal appearance, no masses or tenderness   Skin: normal coloration and turgor, no rashes   Neurologic: oriented, normal, negative, normal mood   Extremities: normal strength, tone, and muscle mass, ROM of all joints is normal   HEENT PERRLA, extraocular movement intact and sclera clear, anicteric   Mouth/Teeth mucous membranes moist, pharynx normal without lesions and dental hygiene good   Neck supple and no masses   Cardiovascular: regular rate and rhythm   Respiratory:  no respiratory distress, normal breath sounds   Abdomen: soft, non-tender; bowel sounds normal; no masses,  no organomegaly     Assessment:   Pregnancy: [id number] Patient Active Problem List   Diagnosis Date Noted  . Advanced maternal age in multigravida, unspecified trimester 09/15/2016  . Encounter for supervision of high risk multigravida of advanced maternal age, antepartum 09/15/2016  . Chronic hypertension during pregnancy, antepartum 09/15/2016  . HSV-2 infection complicating pregnancy 09/15/2016  . Numbness and tingling in left hand 03/27/2016  . Alopecia 03/27/2016  . Left flank pain 01/05/2016  . Fatty liver 01/05/2016  . Hiatal hernia  01/05/2016  . Overweight (BMI 25.0-29.9) 01/05/2016  . Dysuria 07/25/2015  . Throat swelling 10/31/2014  . Healthcare maintenance 09/12/2014  . Acute anterior uveitis 12/13/2013  . Mid back pain 11/02/2013  . Seasonal allergies 11/02/2013  . Sore throat 01/11/2012  . 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 Lumbago 05/18/2011  . Carpal tunnel syndrome 04/10/2011  . Tension headache 04/10/2011  . HERNIATED DISC 12/31/2009  . OTHER VOICE AND RESONANCE DISORDERS 01/30/2008  . 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  Essential hypertension 06/15/2007  . Asthma with acute exacerbation 02/08/2006  . SCIATICA 02/08/2006  . PAP SMEAR, LGSIL, ABNORMAL 02/08/2006     Plan:  1. Advanced maternal age in multigravida, unspecified trimester Interested in NIPS and genetic counseling; also wants NT. MFM referral made. - [place] MFM Fetal Nuchal Translucency; Future - AMB MFM GENETICS REFERRAL - AMB referral to maternal fetal medicine - [place] MFM OB DETAIL +14 WK; Future  2. Chronic hypertension during pregnancy, antepartum Continue Norvasc for now. Baseline labs checked. Discussed implications of CHTN in pregnancy, need for optimizing BP control to decrease CHTN associated maternal-fetal morbidity and mortality, need for antenatal testing and frequent ultrasounds/prenatal visits. All questions answered.  - Comprehensive metabolic panel - Protein / creatinine ratio, urine - aspirin EC 81 MG tablet; Take 1 tablet (81 mg total) by mouth daily. Take after 12 weeks for prevention of preeclampsia later in pregnancy  Dispense: 300 tablet; Refill: 2  3. Encounter for supervision of high risk multigravida of advanced maternal age, antepartum Initial labs drawn - Hemoglobinopathy evaluation - Obstetric Panel, Including HIV - Cystic Fibrosis Mutation 97 - Culture, OB Urine - SMN1 Copy Number Analysis - Prenatal Vit w/Fe-Methylfol-FA (PNV PO); Take by mouth. - Hemoglobin A1c - TSH - Urine cytology ancillary  only Continue prenatal vitamins. Genetic Screening discussed, First trimester screen and NIPS: ordered at MFM Ultrasound discussed; fetal anatomic survey: ordered. Problem list reviewed and updated. The nature of [facility] with multiple MDs and other Advanced Practice Providers was explained to patient; also emphasized that residents, students are part of our team. Routine obstetric precautions reviewed. Return in about 3 weeks (around 10/06/2016) for OB Visit.     Jaynie Collins, MD, FACOG Attending Obstetrician & Gynecologist, [facility] for [employer], [facility]

## 2016-09-16 LAB — PROTEIN / CREATININE RATIO, URINE
Creatinine, Urine: 157.2 mg/dL
Protein, Ur: 11.8 mg/dL
Protein/Creat Ratio: 75 mg/g creat (ref 0–200)

## 2016-09-17 LAB — URINE CYTOLOGY ANCILLARY ONLY
Chlamydia: NEGATIVE
Neisseria Gonorrhea: NEGATIVE
Trichomonas: NEGATIVE

## 2016-09-17 LAB — URINE CULTURE, OB REFLEX

## 2016-09-17 LAB — CULTURE, OB URINE

## 2016-09-28 LAB — OBSTETRIC PANEL, INCLUDING HIV
Antibody Screen: NEGATIVE
Basophils Absolute: 0 10*3/uL (ref 0.0–0.2)
Basos: 0 %
EOS (ABSOLUTE): 0.2 10*3/uL (ref 0.0–0.4)
Eos: 3 %
HIV Screen 4th Generation wRfx: NONREACTIVE
Hematocrit: 37.9 % (ref 34.0–46.6)
Hemoglobin: 12.5 g/dL (ref 11.1–15.9)
Hepatitis B Surface Ag: NEGATIVE
Immature Grans (Abs): 0 10*3/uL (ref 0.0–0.1)
Immature Granulocytes: 1 %
Lymphocytes Absolute: 2.3 10*3/uL (ref 0.7–3.1)
Lymphs: 31 %
MCH: 29.4 pg (ref 26.6–33.0)
MCHC: 33 g/dL (ref 31.5–35.7)
MCV: 89 fL (ref 79–97)
Monocytes Absolute: 0.6 10*3/uL (ref 0.1–0.9)
Monocytes: 8 %
Neutrophils Absolute: 4.1 10*3/uL (ref 1.4–7.0)
Neutrophils: 57 %
Platelets: 299 10*3/uL (ref 150–379)
RBC: 4.25 x10E6/uL (ref 3.77–5.28)
RDW: 14.6 % (ref 12.3–15.4)
RPR Ser Ql: NONREACTIVE
Rh Factor: POSITIVE
Rubella Antibodies, IGG: 3.48 index (ref >0.99)
WBC: 7.2 10*3/uL (ref 3.4–10.8)

## 2016-09-28 LAB — SMN1 COPY NUMBER ANALYSIS (SMA CARRIER SCREENING)

## 2016-09-28 LAB — COMPREHENSIVE METABOLIC PANEL
ALT: 14 IU/L (ref 0–32)
AST: 13 IU/L (ref 0–40)
Albumin/Globulin Ratio: 1.1 — ABNORMAL LOW (ref 1.2–2.2)
Albumin: 4 g/dL (ref 3.5–5.5)
Alkaline Phosphatase: 81 IU/L (ref 39–117)
BUN/Creatinine Ratio: 10 (ref 9–23)
BUN: 6 mg/dL (ref 6–24)
Bilirubin Total: 0.4 mg/dL (ref 0.0–1.2)
CO2: 21 mmol/L (ref 20–29)
Calcium: 9.2 mg/dL (ref 8.7–10.2)
Chloride: 100 mmol/L (ref 96–106)
Creatinine, Ser: 0.62 mg/dL (ref 0.57–1.00)
GFR calc Af Amer: 130 mL/min/{1.73_m2} (ref >59)
GFR calc non Af Amer: 113 mL/min/{1.73_m2} (ref >59)
Globulin, Total: 3.6 g/dL (ref 1.5–4.5)
Glucose: 90 mg/dL (ref 65–99)
Potassium: 3.8 mmol/L (ref 3.5–5.2)
Sodium: 138 mmol/L (ref 134–144)
Total Protein: 7.6 g/dL (ref 6.0–8.5)

## 2016-09-28 LAB — HEMOGLOBINOPATHY EVALUATION
HGB C: 0 %
HGB S: 0 %
HGB VARIANT: 0 %
Hemoglobin A2 Quantitation: 2.7 % (ref 1.8–3.2)
Hemoglobin F Quantitation: 0 % (ref 0.0–2.0)
Hgb A: 97.3 % (ref 96.4–98.8)

## 2016-09-28 LAB — HEMOGLOBIN A1C
Est. average glucose Bld gHb Est-mCnc: 103 mg/dL
Hgb A1c MFr Bld: 5.2 % (ref 4.8–5.6)

## 2016-09-28 LAB — TSH: TSH: 0.039 u[IU]/mL — ABNORMAL LOW (ref 0.450–4.500)

## 2016-09-29 ENCOUNTER — Ambulatory Visit (HOSPITAL_COMMUNITY)
Admission: RE | Admit: 2016-09-29 | Discharge: 2016-09-29 | Disposition: A | Discharge status: Home / Self Care | Payer: Medicaid Other | Source: Ambulatory Visit | Attending: Obstetrics & Gynecology | Admitting: Obstetrics & Gynecology

## 2016-09-29 ENCOUNTER — Encounter (HOSPITAL_COMMUNITY): Payer: Self-pay

## 2016-09-29 ENCOUNTER — Other Ambulatory Visit: Payer: Self-pay | Admitting: Obstetrics & Gynecology

## 2016-09-29 ENCOUNTER — Ambulatory Visit (HOSPITAL_BASED_OUTPATIENT_CLINIC_OR_DEPARTMENT_OTHER)
Admission: RE | Admit: 2016-09-29 | Discharge: 2016-09-29 | Disposition: A | Discharge status: Home / Self Care | Payer: Medicaid Other | Source: Ambulatory Visit | Attending: Obstetrics & Gynecology | Admitting: Obstetrics & Gynecology

## 2016-09-29 DIAGNOSIS — Z3A13 13 weeks gestation of pregnancy: Secondary | ICD-10-CM | POA: Insufficient documentation

## 2016-09-29 DIAGNOSIS — Z315 Encounter for genetic counseling: Secondary | ICD-10-CM | POA: Insufficient documentation

## 2016-09-29 DIAGNOSIS — O09529 Supervision of elderly multigravida, unspecified trimester: Secondary | ICD-10-CM

## 2016-09-29 DIAGNOSIS — O09521 Supervision of elderly multigravida, first trimester: Secondary | ICD-10-CM | POA: Diagnosis not present

## 2016-09-29 DIAGNOSIS — Z3682 Encounter for antenatal screening for nuchal translucency: Secondary | ICD-10-CM

## 2016-09-29 DIAGNOSIS — O10919 Unspecified pre-existing hypertension complicating pregnancy, unspecified trimester: Secondary | ICD-10-CM

## 2016-09-29 LAB — CYSTIC FIBROSIS MUTATION 97: Interpretation: NOT DETECTED

## 2016-09-29 NOTE — Progress Notes (Signed)
Genetic Counseling  High-Risk Gestation Note  Appointment Date:  09/29/2016 Referred By: Delene Loll* Date of Birth:  08-30-1975 Partner: Sue [name]   Pregnancy History: [id number] Estimated Date of Delivery: 04/05/17 Estimated Gestational Age: [redacted]w[redacted]d Attending: Alpha Gula, MD   Sue [name] was seen for genetic counseling because of a maternal age of 41 y.o..  She will be 41 years old at delivery.   In summary:  Discussed AMA and associated risk for fetal aneuploidy  Discussed options for screening  First screen- declined  Quad screen- declined  NIPS- elected to pursue today (Panorama)  Ultrasound- NT ultrasound performed today; see separate report  Discussed diagnostic testing options  CVS-declined  Amniocentesis-declined  Reviewed family history concerns  Discussed carrier screening options- performed through [facility] provider, within normal limits  CF  SMA  Hemoglobinopathies  She was counseled regarding maternal age and the association with risk for chromosome conditions due to nondisjunction with aging of the ova.   We reviewed chromosomes, nondisjunction, and the associated 1 in 22 risk for fetal aneuploidy at [redacted]w[redacted]d gestation related to a maternal age of 41 years old at delivery.  She was counseled that the risk for aneuploidy decreases as gestational age increases, accounting for those pregnancies which spontaneously abort.  We specifically discussed Down syndrome (trisomy 23), trisomies 36 and 46, and sex chromosome aneuploidies (47,XXX and 47,XXY) including the common features and prognoses of each.   We reviewed available screening options including First Screen, Quad screen, noninvasive prenatal screening (NIPS)/cell free DNA (cfDNA) screening, and detailed ultrasound.  She was counseled that screening tests are used to modify a patient's a priori risk for aneuploidy, typically based on age. This estimate provides a pregnancy specific risk  assessment. We reviewed the benefits and limitations of each option. Specifically, we discussed the conditions for which each test screens, the detection rates, and false positive rates of each. She was also counseled regarding diagnostic testing via CVS and amniocentesis. We reviewed the approximate 1 in 100 risk for complications from CVS and the approximate 1 in 300-500 risk for complications from amniocentesis, including spontaneous pregnancy loss. We discussed the possible results that the tests might provide including: positive, negative, unanticipated, and no result. Finally, they were counseled regarding the cost of each option and potential out of pocket expenses.  After consideration of all the options, she elected to proceed with NIPS (Panorama through [facility] laboratory).  Those results will be available in 8-10 days.    She also expressed interest in pursuing a nuchal translucency ultrasound, which was performed today.  The report will be documented separately.  The patient would like to return for a detailed ultrasound at ~18+ weeks gestation.  This appointment was scheduled today. She understands that screening tests cannot rule out all birth defects or genetic syndromes. The patient was advised of this limitation and states she still does not want additional testing at this time.    [name] was provided with written information regarding cystic fibrosis (CF), spinal muscular atrophy (SMA) and hemoglobinopathies including the carrier frequency, availability of carrier screening and prenatal diagnosis if indicated.  In addition, we discussed that CF and hemoglobinopathies are routinely screened for as part of the [place] newborn screening panel.  Sue [name] had carrier screening for CF, SMA, and hemoglobinopathies through her OB provider, which were within normal limits. Thus, her risk to be a carrier for these conditions has been reduced.    Both family histories were reviewed and found to  be  contributory for congenital blindness for the patient's mother due to optic atrophy. No additional relatives were reported with vision loss. Optic nerve abnormalities can be acquired or congenital. We discussed that recurrence risk estimate is difficult to assess without additional information regarding an underlying etiology. However, recurrence risk is most likely low, given the reported family history information. Without further information regarding the provided family history, an accurate genetic risk cannot be calculated. Further genetic counseling is warranted if more information is obtained.  [name] denied exposure to environmental toxins or chemical agents. She denied the use of alcohol, tobacco or street drugs. She denied significant viral illnesses during the course of her pregnancy. Her medical and surgical histories were contributory for rheumatoid arthritis.   I counseled [name] regarding the above risks and available options.  The approximate face-to-face time with the genetic counselor was 45 minutes.  Sue Plowman, MS,  Certified [employer] 09/29/2016

## 2016-09-30 ENCOUNTER — Telehealth: Payer: Self-pay

## 2016-09-30 NOTE — Telephone Encounter (Signed)
Advised pt that provider requested T4 and T3 level to be checked at next visit, patient agreed. Next appt is Tuesday 7-31.

## 2016-10-06 ENCOUNTER — Ambulatory Visit (INDEPENDENT_AMBULATORY_CARE_PROVIDER_SITE_OTHER): Payer: Medicaid Other | Admitting: Obstetrics and Gynecology

## 2016-10-06 VITALS — BP 120/79 | HR 83 | Wt 174.0 lb

## 2016-10-06 DIAGNOSIS — O09529 Supervision of elderly multigravida, unspecified trimester: Secondary | ICD-10-CM

## 2016-10-06 DIAGNOSIS — Z8619 Personal history of other infectious and parasitic diseases: Secondary | ICD-10-CM

## 2016-10-06 DIAGNOSIS — O10912 Unspecified pre-existing hypertension complicating pregnancy, second trimester: Secondary | ICD-10-CM

## 2016-10-06 DIAGNOSIS — Z3009 Encounter for other general counseling and advice on contraception: Secondary | ICD-10-CM

## 2016-10-06 DIAGNOSIS — O09522 Supervision of elderly multigravida, second trimester: Secondary | ICD-10-CM

## 2016-10-06 DIAGNOSIS — R7989 Other specified abnormal findings of blood chemistry: Secondary | ICD-10-CM

## 2016-10-06 DIAGNOSIS — O10919 Unspecified pre-existing hypertension complicating pregnancy, unspecified trimester: Secondary | ICD-10-CM

## 2016-10-06 DIAGNOSIS — R946 Abnormal results of thyroid function studies: Secondary | ICD-10-CM

## 2016-10-06 HISTORY — DX: Personal history of other infectious and parasitic diseases: Z86.19

## 2016-10-06 NOTE — Progress Notes (Signed)
Subjective:  Sue [name] is a 41 y.o. [id number] at [redacted]w[redacted]d being seen today for ongoing prenatal care.  She is currently monitored for the following issues for this high-risk pregnancy and has Essential hypertension; HERNIATED DISC; Carpal tunnel syndrome; Sacroiliac dysfunction; Mid back pain; Seasonal allergies; Acute anterior uveitis; Healthcare maintenance; Fatty liver; Hiatal hernia; Overweight (BMI 25.0-29.9); Alopecia; Advanced maternal age in multigravida, unspecified trimester; Encounter for supervision of high risk multigravida of advanced maternal age, antepartum; Chronic hypertension during pregnancy, antepartum; Unwanted fertility; History of ELISA positive for HSV; and Abnormal thyroid blood test on her problem list.  Patient reports no complaints.  Contractions: Not present. Vag. Bleeding: None.  Movement: Present. Denies leaking of fluid.   The following portions of the patient's history were reviewed and updated as appropriate: allergies, current medications, past family history, past medical history, past social history, past surgical history and problem list. Problem list updated.  Objective:   Vitals:   10/06/16 1537  BP: 120/79  Pulse: 83  Weight: 174 lb (78.9 kg)    Fetal Status: Fetal Heart Rate (bpm): 150   Movement: Present     General:  Alert, oriented and cooperative. Patient is in no acute distress.  Skin: Skin is warm and dry. No rash noted.   Cardiovascular: Normal heart rate noted  Respiratory: Normal respiratory effort, no problems with respiration noted  Abdomen: Soft, gravid, appropriate for gestational age. Pain/Pressure: Absent     Pelvic:  Cervical exam deferred        Extremities: Normal range of motion.  Edema: None  Mental Status: Normal mood and affect. Normal behavior. Normal judgment and thought content.   Urinalysis:      Assessment and Plan:  Pregnancy: [id number] at [redacted]w[redacted]d  1. Encounter for supervision of high risk multigravida of advanced maternal  age, antepartum Stable  2. Chronic hypertension during pregnancy, antepartum Stable  Continue with BASA and Norvasc  3. Advanced maternal age in multigravida, unspecified trimester Normal NT, Panomaroma pending AFP at next OB visit  4. Unwanted fertility Wil need to sign BTL papers at 28 weeks  5. History of ELISA positive for HSV Suppressive treatment at 36 weeks  6. Abnormal thyroid blood test  - Thyroid Panel With TSH  Preterm labor symptoms and general obstetric precautions including but not limited to vaginal bleeding, contractions, leaking of fluid and fetal movement were reviewed in detail with the patient. Please refer to After Visit Summary for other counseling recommendations.  Return in about 4 weeks (around 11/03/2016) for OB visit.   Hermina Staggers, MD

## 2016-10-06 NOTE — Patient Instructions (Signed)

## 2016-10-07 ENCOUNTER — Other Ambulatory Visit: Payer: Self-pay

## 2016-10-07 LAB — THYROID PANEL WITH TSH
Free Thyroxine Index: 2.1 (ref 1.2–4.9)
T3 Uptake Ratio: 17 % — ABNORMAL LOW (ref 24–39)
T4, Total: 12.3 ug/dL — ABNORMAL HIGH (ref 4.5–12.0)
TSH: 0.176 u[IU]/mL — ABNORMAL LOW (ref 0.450–4.500)

## 2016-10-08 ENCOUNTER — Telehealth (HOSPITAL_COMMUNITY): Payer: Self-pay | Admitting: MS"

## 2016-10-08 NOTE — Telephone Encounter (Signed)
Attempted to contact patient regarding results of noninvasive prenatal screening for aneuploidy (Panorama), which are within normal limits. Left message for patient to return call.   Clydie Braun Halynn Reitano 10/08/2016 9:39 AM

## 2016-10-08 NOTE — Telephone Encounter (Signed)
Called Sue [name] to discuss her prenatal cell free DNA test results.  Mrs. Sue [name] had Panorama testing through [facility].  Testing was offered because of advanced maternal age.   The patient was identified by name and DOB.  We reviewed that these are within normal limits, showing a less than 1 in 10,000 risk for trisomies 21, 18 and 13, and monosomy X (Turner syndrome).  In addition, the risk for triploidy and sex chromosome trisomies (47,XXX and 47,XXY) was also low risk.  We reviewed that this testing identifies > 99% of pregnancies with trisomy 97, trisomy 41, sex chromosome trisomies (47,XXX and 47,XXY), and triploidy. The detection rate for trisomy 18 is 98%.  The detection rate for monosomy X is ~92%.  The false positive rate is <0.1% for all conditions. Testing was also consistent with female fetal sex.  The patient did wish to know fetal sex.  She understands that this testing does not identify all genetic conditions.  All questions were answered to her satisfaction, she was encouraged to call with additional questions or concerns.  Quinn Plowman, MS Certified Genetic Counselor 10/08/2016 2:02 PM

## 2016-10-14 ENCOUNTER — Telehealth: Payer: Self-pay

## 2016-10-14 DIAGNOSIS — R7989 Other specified abnormal findings of blood chemistry: Secondary | ICD-10-CM

## 2016-10-14 NOTE — Telephone Encounter (Signed)
Contacted pt to advise of referral to endocrinology by provider, no answer, left vm.

## 2016-10-20 ENCOUNTER — Encounter: Payer: Self-pay | Admitting: *Deleted

## 2016-10-20 ENCOUNTER — Telehealth: Payer: Self-pay | Admitting: *Deleted

## 2016-10-20 DIAGNOSIS — O09529 Supervision of elderly multigravida, unspecified trimester: Secondary | ICD-10-CM

## 2016-10-20 NOTE — Telephone Encounter (Signed)
Patient states she believes that the ASA she has been instructed to take is causing her bowel disease to flare and she is having bleeding from that. She wants to know what to do. Told patient I would check with her provider-but the ASA is a preventative and if it is causing her to have GI bleeding they may have her stop it.

## 2016-10-21 ENCOUNTER — Ambulatory Visit: Payer: Medicaid Other | Admitting: Endocrinology

## 2016-10-21 ENCOUNTER — Telehealth: Payer: Self-pay | Admitting: *Deleted

## 2016-10-21 NOTE — Telephone Encounter (Signed)
Dr Alysia Penna is out of town- per Dr Debroah Loop tell patient to stop ASA for now and will discuss at next appointment. Message left for patient.

## 2016-10-22 ENCOUNTER — Telehealth: Payer: Self-pay | Admitting: Endocrinology

## 2016-10-22 NOTE — Telephone Encounter (Signed)
Returning call but patient only has medicaid.  Ty,  -LL

## 2016-10-22 NOTE — Telephone Encounter (Signed)
LM for pt to see if she can come in at the work in time per Dr. George Hugh request at 1045 today

## 2016-10-23 ENCOUNTER — Encounter: Payer: Self-pay | Admitting: Endocrinology

## 2016-10-23 ENCOUNTER — Ambulatory Visit (INDEPENDENT_AMBULATORY_CARE_PROVIDER_SITE_OTHER): Payer: Medicaid Other | Admitting: Endocrinology

## 2016-10-23 VITALS — BP 126/80 | HR 77 | Wt 176.0 lb

## 2016-10-23 DIAGNOSIS — R946 Abnormal results of thyroid function studies: Secondary | ICD-10-CM

## 2016-10-23 DIAGNOSIS — R7989 Other specified abnormal findings of blood chemistry: Secondary | ICD-10-CM

## 2016-10-23 MED ORDER — METHIMAZOLE 5 MG PO TABS: 5.0000 mg | ORAL_TABLET | Freq: Three times a day (TID) | ORAL | 1 refills | Status: DC

## 2016-10-23 NOTE — Telephone Encounter (Signed)
Patient returning call from [place], phone lines are down so I could not transfer. Please advise.

## 2016-10-23 NOTE — Progress Notes (Signed)
Subjective:    Patient ID: Sue [name], female    DOB: 02-26-1976, 41 y.o.   MRN: [id number]  HPI Pt is referred by Dr Alysia Penna, for hyperthyroidism.  she was dx'ed with mild hyperthyroidism in 2010.  she has never been on therapy for this.  she has never had XRT to the anterior neck, or thyroid surgery.  He has never had thyroid imaging.  she does not consume kelp or any other prescribed or non-prescribed thyroid medication.  she has never been on amiodarone.  She is now 4 mos pregnant.  She has only gained 2 lbs so far.  She had minimal n/v early in the pregnancy.  She has slight palpitations in the chest, but no assoc excessive diaphoresis.   Past Medical History:  Diagnosis Date  . Asthma    mild by PFT's 03/16/1998  . Cervical lymphadenopathy    biopsy negativeMarvetta Gibbons)  . Chronic hypertension   . Complete spontaneous abortion    x2  . GERD (gastroesophageal reflux disease)   . History of abnormal Pap smear    s/p cryo( Dr. Wiliam Ke)  . HSV (herpes simplex virus) anogenital infection   . Hypertension   . Kidney stone   . Normal vaginal delivery    x3  . Rheumatoid arthritis (HCC)   . Sciatica 01/12/06  . 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   . Vaginal Pap smear, abnormal     Past Surgical History:  Procedure Laterality Date  . biopsy of lymph node in R neck  2007.  Marland Kitchen LEEP      Social History   Social History  . Marital status: Single    Spouse name: N/A  . Number of children: N/A  . Years of education: N/A   Occupational History  . Not on file.   Social History Main Topics  . Smoking status: Never Smoker  . Smokeless tobacco: Never Used  . Alcohol use No     Comment: occasional beer and liquor on weekends  . Drug use: No  . Sexual activity: Yes    Birth control/ protection: None   Other Topics Concern  . Not  on file   Social History Narrative  . No narrative on file    Current Outpatient Prescriptions on File Prior to Visit  Medication Sig Dispense Refill  . acetaminophen (TYLENOL) 500 MG tablet Take 1,000 mg by mouth every 6 (six) hours as needed for mild pain, moderate pain or headache.    Marland Kitchen amLODipine (NORVASC) 5 MG tablet take 1 tablet by mouth once daily 30 tablet 11  . calcium carbonate (TUMS - DOSED IN MG ELEMENTAL CALCIUM) 500 MG chewable tablet Chew 1 tablet by mouth daily.    . cetirizine (ZYRTEC) 10 MG tablet Take 1 tablet (10 mg total) by mouth daily. (Patient taking differently: Take 10 mg by mouth daily as needed for allergies. ) 30 tablet 6  . HUMIRA PEN 40 MG/0.8ML PNKT   0  . pantoprazole (PROTONIX) 40 MG tablet Take 40 mg by mouth daily.  1  . Prenatal Vit w/Fe-Methylfol-FA (PNV PO) Take by mouth.    . Probiotic Product (PROBIOTIC-10 PO) Take by mouth.    Marland Kitchen aspirin EC 81 MG tablet Take 1 tablet (81 mg total) by mouth daily. Take after 12 weeks for prevention of preeclampsia later in pregnancy (Patient not  taking: Reported on 10/23/2016) 300 tablet 2  . [DISCONTINUED] levonorgestrel-ethinyl estradiol (SEASONALE,INTROVALE,JOLESSA) 0.15-0.03 MG tablet Take 1 tablet by mouth daily. (Patient not taking: Reported on 07/27/2015) 1 Package 3  . [DISCONTINUED] loratadine (CLARITIN) 10 MG tablet Take 1 tablet (10 mg total) by mouth daily. (Patient not taking: Reported on 07/27/2015) 30 tablet 0   No current facility-administered medications on file prior to visit.     Allergies  Allergen Reactions  . Ciprofloxacin Swelling    Throat swells  . Clindamycin/Lincomycin Swelling  . Sulfamethoxazole-Trimethoprim Swelling    Swelling is of the lips. Swelling is of the lips.  . Amoxicillin Rash    Has patient had a PCN reaction causing immediate rash, facial/tongue/throat swelling, SOB or lightheadedness with hypotension: no Has patient had a PCN reaction causing severe rash involving mucus  membranes or skin necrosis: pt had rash -- non severe  Has patient had a PCN reaction that required hospitalization: no Has patient had a PCN reaction occurring within the last 10 years: no If all of the above answers are "NO", then may proceed with Cephalosporin use.     Family History  Problem Relation Age of Onset  . Ulcerative colitis Mother   . Hypertension Mother   . Diabetes Father   . Heart attack Father   . Hypertension Father   . Diabetes Sister   . Thyroid cancer Sister   . Heart attack Maternal Grandmother   . Stroke Maternal Grandmother     BP 126/80   Pulse 77   Wt 176 lb (79.8 kg)   LMP 06/29/2016 (Approximate)   SpO2 98%   BMI 30.21 kg/m    Review of Systems denies fever, headache, hoarseness, diplopia, edema, sob, diarrhea, polyuria, muscle weakness, tremor, anxiety, heat intolerance, and rhinorrhea.  She has dry skin and easy bruising.     Objective:   Physical Exam VS: see vs page GEN: no distress HEAD: head: no deformity eyes: no periorbital swelling, no proptosis external nose and ears are normal mouth: no lesion seen NECK: a healed scar is present (LN resection in 2006).  I do not appreciate a nodule in the thyroid or elsewhere in the neck CHEST WALL: no deformity LUNGS: clear to auscultation CV: reg rate and rhythm, no murmur ABD: gravid MUSCULOSKELETAL: muscle bulk and strength are grossly normal.  no obvious joint swelling.  gait is normal and steady EXTEMITIES: no deformity.  no ulcer on the feet.  feet are of normal color and temp.  no edema PULSES: dorsalis pedis intact bilat.  no carotid bruit NEURO:  cn 2-12 grossly intact.   readily moves all 4's.  sensation is intact to touch on the feet SKIN:  Normal texture and temperature.  No rash or suspicious lesion is visible.   NODES:  None palpable at the neck PSYCH: alert, well-oriented.  Does not appear anxious nor depressed.   CT (2014): Small nodular lesion left lobe thyroid  Lab  Results  Component Value Date   TSH 0.176 (L) 10/06/2016   T4TOTAL 12.3 (H) 10/06/2016   I have reviewed outside records, and summarized: Pt was noted to have suppressed TSH, and referred here.  She is considered to have a high-risk pregnancy, due to several conditions.     Assessment & Plan:  Small nodular thyroid, new to me.  Hyperthyroidism, due to the nodules.   Pregnancy with minimal weight gain.  In this setting, low-dose tapazole rx is reasonable.   Patient Instructions  I have sent  a prescription to your pharmacy, to slow the thyroid. If ever you have fever while taking methimazole, stop it and call us, even if the reason is obvious, because of the risk of a rare side-effect. Please come back for a follow-up appointment in 2-3 weeks.

## 2016-10-23 NOTE — Patient Instructions (Signed)
I have sent a prescription to your pharmacy, to slow the thyroid. If ever you have fever while taking methimazole, stop it and call us, even if the reason is obvious, because of the risk of a rare side-effect. Please come back for a follow-up appointment in 2-3 weeks.   

## 2016-10-23 NOTE — Telephone Encounter (Signed)
I put patient on the schedule for 4:00 today.  Dr Everardo All wanted to see her 10 64 the 15th, but she could make it so I put her in today, hope it is not a problem.

## 2016-10-26 ENCOUNTER — Telehealth: Payer: Self-pay | Admitting: Endocrinology

## 2016-10-26 MED ORDER — METHIMAZOLE 5 MG PO TABS: 5.0000 mg | ORAL_TABLET | Freq: Every day | ORAL | 1 refills | Status: DC

## 2016-10-26 NOTE — Telephone Encounter (Signed)
It should be once a day.  I have corrected, and resent the prescription to your pharmacy.  Thank you for calling.  I'll see you next time.

## 2016-10-26 NOTE — Telephone Encounter (Signed)
Patient needs clarification on the directions for her methimazole (TAPAZOLE) 5 MG tablet. Patient was told to take 1x a day however, the directions on the medication says take 3x a day. Call patient as soon as possible to advise so she can begin taking the medication.

## 2016-10-26 NOTE — Telephone Encounter (Signed)
Please advise. Thanks.  

## 2016-10-27 ENCOUNTER — Telehealth: Payer: Self-pay

## 2016-10-27 NOTE — Telephone Encounter (Signed)
Called patient and LVM advising of the once a day medication. Left call back number if questions.  

## 2016-10-27 NOTE — Telephone Encounter (Signed)
Called patient and LVM advising of the once a day medication. Left call back number if questions.

## 2016-10-27 NOTE — Telephone Encounter (Signed)
Calling about Methimazole dosage.   Advised it's once a day per Dr. George Hugh note, but patient would like a call back from Sarah.  Ty,  -LL

## 2016-11-03 ENCOUNTER — Ambulatory Visit (INDEPENDENT_AMBULATORY_CARE_PROVIDER_SITE_OTHER): Payer: Medicaid Other | Admitting: Obstetrics and Gynecology

## 2016-11-03 ENCOUNTER — Encounter: Payer: Medicaid Other | Admitting: Obstetrics and Gynecology

## 2016-11-03 VITALS — BP 120/76 | HR 83 | Wt 176.0 lb

## 2016-11-03 DIAGNOSIS — E0590 Thyrotoxicosis, unspecified without thyrotoxic crisis or storm: Secondary | ICD-10-CM

## 2016-11-03 DIAGNOSIS — O99282 Endocrine, nutritional and metabolic diseases complicating pregnancy, second trimester: Secondary | ICD-10-CM

## 2016-11-03 DIAGNOSIS — Z3009 Encounter for other general counseling and advice on contraception: Secondary | ICD-10-CM

## 2016-11-03 DIAGNOSIS — O10919 Unspecified pre-existing hypertension complicating pregnancy, unspecified trimester: Secondary | ICD-10-CM

## 2016-11-03 DIAGNOSIS — O09529 Supervision of elderly multigravida, unspecified trimester: Secondary | ICD-10-CM

## 2016-11-03 DIAGNOSIS — Z8619 Personal history of other infectious and parasitic diseases: Secondary | ICD-10-CM

## 2016-11-03 DIAGNOSIS — O99280 Endocrine, nutritional and metabolic diseases complicating pregnancy, unspecified trimester: Secondary | ICD-10-CM | POA: Insufficient documentation

## 2016-11-03 NOTE — Progress Notes (Signed)
Subjective:  Sue [name] is a 41 y.o. [id number] at [redacted]w[redacted]d being seen today for ongoing prenatal care.  She is currently monitored for the following issues for this high-risk pregnancy and has Essential hypertension; HERNIATED DISC; Carpal tunnel syndrome; Sacroiliac dysfunction; Mid back pain; Seasonal allergies; Acute anterior uveitis; Healthcare maintenance; Fatty liver; Hiatal hernia; Overweight (BMI 25.0-29.9); Alopecia; Advanced maternal age in multigravida, unspecified trimester; Encounter for supervision of high risk multigravida of advanced maternal age, antepartum; Chronic hypertension during pregnancy, antepartum; Unwanted fertility; History of ELISA positive for HSV; and Hyperthyroidism affecting pregnancy on her problem list.  Patient reports no complaints.  Contractions: Not present. Vag. Bleeding: None.  Movement: Present. Denies leaking of fluid.   The following portions of the patient's history were reviewed and updated as appropriate: allergies, current medications, past family history, past medical history, past social history, past surgical history and problem list. Problem list updated.  Objective:   Vitals:   11/03/16 0915  BP: 120/76  Pulse: 83  Weight: 176 lb (79.8 kg)    Fetal Status: Fetal Heart Rate (bpm): 155   Movement: Present     General:  Alert, oriented and cooperative. Patient is in no acute distress.  Skin: Skin is warm and dry. No rash noted.   Cardiovascular: Normal heart rate noted  Respiratory: Normal respiratory effort, no problems with respiration noted  Abdomen: Soft, gravid, appropriate for gestational age. Pain/Pressure: Absent     Pelvic:  Cervical exam deferred        Extremities: Normal range of motion.  Edema: None  Mental Status: Normal mood and affect. Normal behavior. Normal judgment and thought content.   Urinalysis:      Assessment and Plan:  Pregnancy: [id number] at [redacted]w[redacted]d  1. Encounter for supervision of high risk multigravida of advanced  maternal age, antepartum Stable - AFP, Serum, Open Spina Bifida  2. Advanced maternal age in multigravida, unspecified trimester Genetic testing negative AFP today Anatomy scan scheduled  3. Chronic hypertension during pregnancy, antepartum Stable BP good Unable to tolerate BASA d/t to GI side effects, stopped  4. History of ELISA positive for HSV Suppression at 36 weeks  5. Unwanted fertility Tubal papers at 28 weeks  6. Hyperthyroidism affecting pregnancy in second trimester Stable On Tapazole Followed by IM  Preterm labor symptoms and general obstetric precautions including but not limited to vaginal bleeding, contractions, leaking of fluid and fetal movement were reviewed in detail with the patient. Please refer to After Visit Summary for other counseling recommendations.  Return in about 4 weeks (around 12/01/2016) for OB visit.   Hermina Staggers, MD

## 2016-11-03 NOTE — Progress Notes (Signed)
[facility]asa Suppository? Safe?

## 2016-11-05 ENCOUNTER — Encounter: Payer: Self-pay | Admitting: Obstetrics

## 2016-11-06 LAB — AFP, SERUM, OPEN SPINA BIFIDA
AFP MoM: 1.73
AFP Value: 73.7 ng/mL
Gest. Age on Collection Date: 18 weeks
Maternal Age At EDD: 41.2 yr
OSBR Risk 1 IN: 3028
Test Results:: NEGATIVE
Weight: 176 [lb_av]

## 2016-11-09 ENCOUNTER — Ambulatory Visit (HOSPITAL_COMMUNITY)
Admission: EM | Admit: 2016-11-09 | Discharge: 2016-11-09 | Disposition: A | Discharge status: Home / Self Care | Payer: Medicaid Other | Attending: Family Medicine | Admitting: Family Medicine

## 2016-11-09 ENCOUNTER — Encounter (HOSPITAL_COMMUNITY): Payer: Self-pay | Admitting: Emergency Medicine

## 2016-11-09 DIAGNOSIS — J4520 Mild intermittent asthma, uncomplicated: Secondary | ICD-10-CM | POA: Diagnosis not present

## 2016-11-09 MED ORDER — ALBUTEROL SULFATE HFA 108 (90 BASE) MCG/ACT IN AERS: 1.0000 | INHALATION_SPRAY | Freq: Four times a day (QID) | RESPIRATORY_TRACT | 0 refills | Status: DC | PRN

## 2016-11-09 NOTE — ED Provider Notes (Signed)
[facility]   [id number] 11/09/16 Arrival Time: 1840  ASSESSMENT & PLAN:  1. Mild intermittent asthma without complication     Meds ordered this encounter  Medications  . albuterol (PROVENTIL HFA;VENTOLIN HFA) 108 (90 Base) MCG/ACT inhaler    Sig: Inhale 1-2 puffs into the lungs every 6 (six) hours as needed for wheezing or shortness of breath.    Dispense:  1 Inhaler    Refill:  0    Order Specific Question:   Supervising Provider    Answer:   Eustace Moore [[id number]]    Reviewed expectations re: course of current medical issues. Questions answered. Outlined signs and symptoms indicating need for more acute intervention. Patient verbalized understanding. After Visit Summary given.   SUBJECTIVE:  Jenniffer Vessels is a 41 y.o. female who presents with complaint of asthma sx's and she is out of her albuterol MDI  ROS: As per HPI.   OBJECTIVE:  Vitals:   11/09/16 1929  BP: 125/62  Pulse: 97  Temp: 99.2 F (37.3 C)  TempSrc: Oral  SpO2: 100%    General appearance: alert; no distress Eyes: PERRLA; EOMI; conjunctiva normal HENT: normocephalic; atraumatic; TMs normal; nasal mucosa normal; oral mucosa normal Neck: supple Lungs: clear to auscultation bilaterally Heart: regular rate and rhythm Abdomen: soft, non-tender; bowel sounds normal; no masses or organomegaly; no guarding or rebound tenderness Back: no CVA tenderness Extremities: no cyanosis or edema; symmetrical with no gross deformities Skin: warm and dry Neurologic: normal gait; normal symmetric reflexes Psychological: alert and cooperative; normal mood and affect  Labs: Results for orders placed or performed in visit on 11/03/16  AFP, Serum, Open Spina Bifida  Result Value Ref Range   Results Report    Test Results: *Screen Negative*    Gest. Age on Collection Date 18.0 weeks   Gestat. Age Based On As provided    [age]D [age] yr   Race Black    Weight 176 lbs   Insulin Dep Diabetes  No    Multiple Gestation No    AFP Value 73.7 ng/mL   AFP MoM 1.73    OSBR Risk 1 IN 3,028    Interpretation Comment    Comment: Comment    Labs Reviewed - No data to display  Imaging: No results found.  Allergies  Allergen Reactions  . Ciprofloxacin Swelling    Throat swells  . Clindamycin/Lincomycin Swelling  . Sulfamethoxazole-Trimethoprim Swelling    Swelling is of the lips. Swelling is of the lips.  . Amoxicillin Rash    Has patient had a PCN reaction causing immediate rash, facial/tongue/throat swelling, SOB or lightheadedness with hypotension: no Has patient had a PCN reaction causing severe rash involving mucus membranes or skin necrosis: pt had rash -- non severe  Has patient had a PCN reaction that required hospitalization: no Has patient had a PCN reaction occurring within the last 10 years: no If all of the above answers are "NO", then may proceed with Cephalosporin use.     Past Medical History:  Diagnosis Date  . Asthma    mild by PFT's 03/16/1998  . Cervical lymphadenopathy    biopsy negativeMarvetta Gibbons)  . Chronic hypertension   . Complete spontaneous abortion    x2  . GERD (gastroesophageal reflux disease)   . History of abnormal Pap smear    s/p cryo( Dr. Wiliam Ke)  . HSV (herpes simplex virus) anogenital infection   . Hypertension   . Kidney stone   .  Normal vaginal delivery    x3  . Rheumatoid arthritis (HCC)   . Sciatica 01/12/06  . 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   . Vaginal Pap smear, abnormal    Social History   Social History  . Marital status: Single    Spouse name: N/A  . Number of children: N/A  . Years of education: N/A   Occupational History  . Not on file.   Social History Main Topics  . Smoking status: Never Smoker  . Smokeless tobacco: Never Used  . Alcohol use No      Comment: occasional beer and liquor on weekends  . Drug use: No  . Sexual activity: Yes    Birth control/ protection: None   Other Topics Concern  . Not on file   Social History Narrative  . No narrative on file   Family History  Problem Relation Age of Onset  . Ulcerative colitis Mother   . Hypertension Mother   . Diabetes Father   . Heart attack Father   . Hypertension Father   . Diabetes Sister   . Thyroid cancer Sister   . Heart attack Maternal Grandmother   . Stroke Maternal Grandmother    Past Surgical History:  Procedure Laterality Date  . biopsy of lymph node in R neck  2007.  Marland Kitchen LEEP       Deatra Canter, FNP 11/09/16 1946

## 2016-11-09 NOTE — ED Triage Notes (Signed)
Pt has had post nasal drip for the last three weeks.  For the last two days she has been coughing a lot and she is concerned it is her asthma.  She only has an expired inhaler.

## 2016-11-10 ENCOUNTER — Other Ambulatory Visit (HOSPITAL_COMMUNITY): Payer: Self-pay | Admitting: *Deleted

## 2016-11-10 ENCOUNTER — Encounter (HOSPITAL_COMMUNITY): Payer: Medicaid Other

## 2016-11-10 ENCOUNTER — Other Ambulatory Visit (HOSPITAL_COMMUNITY): Payer: Medicaid Other

## 2016-11-10 ENCOUNTER — Ambulatory Visit (HOSPITAL_COMMUNITY)
Admission: RE | Admit: 2016-11-10 | Discharge: 2016-11-10 | Disposition: A | Discharge status: Home / Self Care | Payer: Medicaid Other | Source: Ambulatory Visit | Attending: Obstetrics & Gynecology | Admitting: Obstetrics & Gynecology

## 2016-11-10 ENCOUNTER — Encounter (HOSPITAL_COMMUNITY): Payer: Self-pay

## 2016-11-10 DIAGNOSIS — Z3A19 19 weeks gestation of pregnancy: Secondary | ICD-10-CM | POA: Insufficient documentation

## 2016-11-10 DIAGNOSIS — O09522 Supervision of elderly multigravida, second trimester: Secondary | ICD-10-CM

## 2016-11-10 DIAGNOSIS — O09529 Supervision of elderly multigravida, unspecified trimester: Secondary | ICD-10-CM | POA: Diagnosis present

## 2016-11-10 DIAGNOSIS — O10919 Unspecified pre-existing hypertension complicating pregnancy, unspecified trimester: Secondary | ICD-10-CM

## 2016-11-10 NOTE — Addendum Note (Signed)
Encounter addended by: Lenise Arena, RT on: 11/10/2016  3:14 PM<BR>    Actions taken: Imaging Exam ended

## 2016-11-12 ENCOUNTER — Telehealth: Payer: Self-pay

## 2016-11-12 NOTE — Telephone Encounter (Signed)
Pt called stating that she was given an asthma pump when she was seen at ED on 9/3. She states that the pump was not working. She spoke with [place]. Call was lost during transfer to triage nurse. I attempted to call pt back to get more information, but no answer. Left message on vm for pt to return call

## 2016-11-12 NOTE — Telephone Encounter (Signed)
Pt advised to be re-evaluated either at ED, urgent care, or [facility] per Dr. Alysia Penna

## 2016-11-26 ENCOUNTER — Ambulatory Visit (INDEPENDENT_AMBULATORY_CARE_PROVIDER_SITE_OTHER): Payer: Medicaid Other | Admitting: Endocrinology

## 2016-11-26 ENCOUNTER — Encounter: Payer: Self-pay | Admitting: Endocrinology

## 2016-11-26 VITALS — BP 116/78 | HR 81 | Wt 179.0 lb

## 2016-11-26 DIAGNOSIS — E0590 Thyrotoxicosis, unspecified without thyrotoxic crisis or storm: Secondary | ICD-10-CM | POA: Diagnosis not present

## 2016-11-26 DIAGNOSIS — O99282 Endocrine, nutritional and metabolic diseases complicating pregnancy, second trimester: Secondary | ICD-10-CM

## 2016-11-26 LAB — T4, FREE: Free T4: 0.69 ng/dL (ref 0.60–1.60)

## 2016-11-26 LAB — TSH: TSH: 0.42 u[IU]/mL (ref 0.35–4.50)

## 2016-11-26 MED ORDER — METHIMAZOLE 5 MG PO TABS: 5.0000 mg | ORAL_TABLET | ORAL | 3 refills | Status: DC

## 2016-11-26 NOTE — Progress Notes (Signed)
Subjective:    Patient ID: Sue [name], female    DOB: May 21, 1975, 41 y.o.   MRN: [id number]  HPI Pt returns for f/u of hyperthyroidism.  she was (dx'ed 2010; she has never had thyroid imaging, but PE shows nodules; she has only gained 2 lbs so far; he had minimal n/v early in the pregnancy; despite the fact that hyperthyroidism is mild she is rx'ed with tapazole, due to poor wt gain). She is at 22 weeks now.  She has gained 3 more lbs.  Since on the tapazole, pt states she feels well, except for palpitations.   Past Medical History:  Diagnosis Date  . Asthma    mild by PFT's 03/16/1998  . Cervical lymphadenopathy    biopsy negativeMarvetta Gibbons)  . Chronic hypertension   . Complete spontaneous abortion    x2  . GERD (gastroesophageal reflux disease)   . History of abnormal Pap smear    s/p cryo( Dr. Wiliam Ke)  . HSV (herpes simplex virus) anogenital infection   . Hypertension   . Kidney stone   . Normal vaginal delivery    x3  . Rheumatoid arthritis (HCC)   . Sciatica 01/12/06  . 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   . Vaginal Pap smear, abnormal     Past Surgical History:  Procedure Laterality Date  . biopsy of lymph node in R neck  2007.  Marland Kitchen LEEP      Social History   Social History  . Marital status: Single    Spouse name: N/A  . Number of children: N/A  . Years of education: N/A   Occupational History  . Not on file.   Social History Main Topics  . Smoking status: Never Smoker  . Smokeless tobacco: Never Used  . Alcohol use No     Comment: occasional beer and liquor on weekends  . Drug use: No  . Sexual activity: Yes    Birth control/ protection: None   Other Topics Concern  . Not on file   Social History Narrative  . No narrative on file    Current Outpatient Prescriptions on File Prior to Visit    Medication Sig Dispense Refill  . acetaminophen (TYLENOL) 500 MG tablet Take 1,000 mg by mouth every 6 (six) hours as needed for mild pain, moderate pain or headache.    . albuterol (PROVENTIL HFA;VENTOLIN HFA) 108 (90 Base) MCG/ACT inhaler Inhale 1-2 puffs into the lungs every 6 (six) hours as needed for wheezing or shortness of breath. 1 Inhaler 0  . amLODipine (NORVASC) 5 MG tablet take 1 tablet by mouth once daily 30 tablet 11  . calcium carbonate (TUMS - DOSED IN MG ELEMENTAL CALCIUM) 500 MG chewable tablet Chew 1 tablet by mouth daily.    . cetirizine (ZYRTEC) 10 MG tablet Take 1 tablet (10 mg total) by mouth daily. (Patient taking differently: Take 10 mg by mouth daily as needed for allergies. ) 30 tablet 6  . Cetirizine HCl (ZYRTEC ALLERGY PO) Take by mouth.    Marland Kitchen HUMIRA PEN 40 MG/0.8ML PNKT   0  . pantoprazole (PROTONIX) 40 MG tablet Take 40 mg by mouth daily.  1  . Prenatal Vit w/Fe-Methylfol-FA (PNV PO) Take by mouth.    . [DISCONTINUED] levonorgestrel-ethinyl estradiol (SEASONALE,INTROVALE,JOLESSA) 0.15-0.03 MG tablet Take 1 tablet by mouth daily. (Patient not taking: Reported on  07/27/2015) 1 Package 3  . [DISCONTINUED] loratadine (CLARITIN) 10 MG tablet Take 1 tablet (10 mg total) by mouth daily. (Patient not taking: Reported on 07/27/2015) 30 tablet 0   No current facility-administered medications on file prior to visit.     Allergies  Allergen Reactions  . Ciprofloxacin Swelling    Throat swells  . Clindamycin/Lincomycin Swelling  . Sulfamethoxazole-Trimethoprim Swelling    Swelling is of the lips. Swelling is of the lips.  . Amoxicillin Rash    Has patient had a PCN reaction causing immediate rash, facial/tongue/throat swelling, SOB or lightheadedness with hypotension: no Has patient had a PCN reaction causing severe rash involving mucus membranes or skin necrosis: pt had rash -- non severe  Has patient had a PCN reaction that required hospitalization: no Has patient had a  PCN reaction occurring within the last 10 years: no If all of the above answers are "NO", then may proceed with Cephalosporin use.     Family History  Problem Relation Age of Onset  . Ulcerative colitis Mother   . Hypertension Mother   . Diabetes Father   . Heart attack Father   . Hypertension Father   . Diabetes Sister   . Thyroid cancer Sister   . Heart attack Maternal Grandmother   . Stroke Maternal Grandmother     BP 116/78   Pulse 81   Wt 179 lb (81.2 kg)   LMP 06/29/2016 (Approximate)   SpO2 99%   BMI 30.73 kg/m   Review of Systems Denies fever    Objective:   Physical Exam VS: see vs page GEN: no distress.  NECK: a healed scar is present (LN resection in 2006).  I do not appreciate a nodule in the thyroid or elsewhere in the neck  Lab Results  Component Value Date   TSH 0.42 11/26/2016   T4TOTAL 12.3 (H) 10/06/2016      Assessment & Plan:  Hyperthyroidism: well-controlled, but tapazole is reduced today, due to pregnancy.

## 2016-11-26 NOTE — Patient Instructions (Signed)
blood tests are requested for you today.  We'll let you know about the results. If it is better, we'll plan to reduce the methimazole.  If ever you have fever while taking methimazole, stop it and call us, even if the reason is obvious, because of the risk of a rare side-effect. Please come back for a follow-up appointment in 6 weeks.

## 2016-12-03 ENCOUNTER — Encounter: Payer: Self-pay | Admitting: Obstetrics & Gynecology

## 2016-12-03 ENCOUNTER — Ambulatory Visit (INDEPENDENT_AMBULATORY_CARE_PROVIDER_SITE_OTHER): Payer: Medicaid Other | Admitting: Obstetrics & Gynecology

## 2016-12-03 DIAGNOSIS — O09529 Supervision of elderly multigravida, unspecified trimester: Secondary | ICD-10-CM

## 2016-12-03 DIAGNOSIS — K219 Gastro-esophageal reflux disease without esophagitis: Secondary | ICD-10-CM | POA: Insufficient documentation

## 2016-12-03 DIAGNOSIS — O09522 Supervision of elderly multigravida, second trimester: Secondary | ICD-10-CM

## 2016-12-03 MED ORDER — METOCLOPRAMIDE HCL 5 MG PO TABS: 5.0000 mg | ORAL_TABLET | Freq: Three times a day (TID) | ORAL | 2 refills | Status: DC

## 2016-12-03 NOTE — Progress Notes (Signed)
   PRENATAL VISIT NOTE  Subjective:  Sue [name] is a 41 y.o. [id number] at [redacted]w[redacted]d being seen today for ongoing prenatal care.  She is currently monitored for the following issues for this high-risk pregnancy and has Essential hypertension; HERNIATED DISC; Carpal tunnel syndrome; Sacroiliac dysfunction; Mid back pain; Seasonal allergies; Acute anterior uveitis; Healthcare maintenance; Fatty liver; Hiatal hernia; Overweight (BMI 25.0-29.9); Alopecia; Advanced maternal age in multigravida, unspecified trimester; Encounter for supervision of high risk multigravida of advanced maternal age, antepartum; Chronic hypertension during pregnancy, antepartum; Unwanted fertility; History of ELISA positive for HSV; Hyperthyroidism affecting pregnancy; and GERD (gastroesophageal reflux disease) on her problem list.  Patient reports heartburn and persistent cough.  Contractions: Irregular. Vag. Bleeding: None.  Movement: Present. Denies leaking of fluid.   The following portions of the patient's history were reviewed and updated as appropriate: allergies, current medications, past family history, past medical history, past social history, past surgical history and problem list. Problem list updated.  Objective:   Vitals:   12/03/16 1459  BP: 118/80  Pulse: 90  Weight: 179 lb 8 oz (81.4 kg)    Fetal Status: Fetal Heart Rate (bpm): 152   Movement: Present     General:  Alert, oriented and cooperative. Patient is in no acute distress.  Skin: Skin is warm and dry. No rash noted.   Cardiovascular: Normal heart rate noted  Respiratory: Normal respiratory effort, no problems with respiration noted CTA  Abdomen: Soft, gravid, appropriate for gestational age.  Pain/Pressure: Present     Pelvic: Cervical exam deferred        Extremities: Normal range of motion.  Edema: Trace  Mental Status:  Normal mood and affect. Normal behavior. Normal judgment and thought content.   Assessment and Plan:  Pregnancy: [id number] at  [redacted]w[redacted]d  1. Encounter for supervision of high risk multigravida of advanced maternal age, antepartum GERD and associated cough - metoCLOPramide (REGLAN) 5 MG tablet; Take 1 tablet (5 mg total) by mouth 4 (four) times daily -  before meals and at bedtime.  Dispense: 120 tablet; Refill: 2  2. Gastroesophageal reflux disease, esophagitis presence not specified  - metoCLOPramide (REGLAN) 5 MG tablet; Take 1 tablet (5 mg total) by mouth 4 (four) times daily -  before meals and at bedtime.  Dispense: 120 tablet; Refill: 2  Preterm labor symptoms and general obstetric precautions including but not limited to vaginal bleeding, contractions, leaking of fluid and fetal movement were reviewed in detail with the patient. Please refer to After Visit Summary for other counseling recommendations.  Return in about 4 weeks (around 12/31/2016) for 2 hr GTT.   Scheryl Darter, MD

## 2016-12-03 NOTE — Patient Instructions (Signed)

## 2016-12-08 ENCOUNTER — Telehealth: Payer: Self-pay

## 2016-12-08 NOTE — Telephone Encounter (Signed)
Returned call, patient stated that reglan has been making her heart flutter, per provider orders, advised to follow up with gastro, pt agreed.

## 2016-12-10 ENCOUNTER — Ambulatory Visit (HOSPITAL_COMMUNITY)
Admission: RE | Admit: 2016-12-10 | Discharge: 2016-12-10 | Disposition: A | Discharge status: Home / Self Care | Payer: Medicaid Other | Source: Ambulatory Visit | Attending: Obstetrics & Gynecology | Admitting: Obstetrics & Gynecology

## 2016-12-10 ENCOUNTER — Other Ambulatory Visit (HOSPITAL_COMMUNITY): Payer: Self-pay | Admitting: Obstetrics and Gynecology

## 2016-12-10 ENCOUNTER — Encounter (HOSPITAL_COMMUNITY): Payer: Self-pay

## 2016-12-10 DIAGNOSIS — O99280 Endocrine, nutritional and metabolic diseases complicating pregnancy, unspecified trimester: Secondary | ICD-10-CM

## 2016-12-10 DIAGNOSIS — O99282 Endocrine, nutritional and metabolic diseases complicating pregnancy, second trimester: Secondary | ICD-10-CM | POA: Diagnosis not present

## 2016-12-10 DIAGNOSIS — Z3A23 23 weeks gestation of pregnancy: Secondary | ICD-10-CM | POA: Diagnosis not present

## 2016-12-10 DIAGNOSIS — O10012 Pre-existing essential hypertension complicating pregnancy, second trimester: Secondary | ICD-10-CM | POA: Insufficient documentation

## 2016-12-10 DIAGNOSIS — E0590 Thyrotoxicosis, unspecified without thyrotoxic crisis or storm: Secondary | ICD-10-CM | POA: Diagnosis not present

## 2016-12-10 DIAGNOSIS — O09212 Supervision of pregnancy with history of pre-term labor, second trimester: Secondary | ICD-10-CM | POA: Diagnosis not present

## 2016-12-10 DIAGNOSIS — O09522 Supervision of elderly multigravida, second trimester: Secondary | ICD-10-CM | POA: Diagnosis present

## 2016-12-10 DIAGNOSIS — Z362 Encounter for other antenatal screening follow-up: Secondary | ICD-10-CM

## 2016-12-10 DIAGNOSIS — E079 Disorder of thyroid, unspecified: Secondary | ICD-10-CM

## 2016-12-10 DIAGNOSIS — O10919 Unspecified pre-existing hypertension complicating pregnancy, unspecified trimester: Secondary | ICD-10-CM

## 2016-12-11 ENCOUNTER — Other Ambulatory Visit (HOSPITAL_COMMUNITY): Payer: Self-pay | Admitting: *Deleted

## 2016-12-11 DIAGNOSIS — O99280 Endocrine, nutritional and metabolic diseases complicating pregnancy, unspecified trimester: Secondary | ICD-10-CM

## 2016-12-11 DIAGNOSIS — E0590 Thyrotoxicosis, unspecified without thyrotoxic crisis or storm: Secondary | ICD-10-CM

## 2016-12-29 ENCOUNTER — Other Ambulatory Visit: Payer: Self-pay

## 2016-12-29 ENCOUNTER — Telehealth: Payer: Self-pay

## 2016-12-29 MED ORDER — AZITHROMYCIN 500 MG PO TABS: 500.0000 mg | ORAL_TABLET | Freq: Every day | ORAL | 0 refills | Status: DC

## 2016-12-29 NOTE — Telephone Encounter (Signed)
Pt c/o painful Barth cyst L side labia.

## 2016-12-29 NOTE — Telephone Encounter (Signed)
Consulted with Dr. Clearance Coots. Reviewed allergies. Zithromycin 500 mg 1 tab po qd sent to [facility]. Pt to apply warm compress and keep upcoming appt on Thurs.

## 2016-12-31 ENCOUNTER — Other Ambulatory Visit: Payer: Medicaid Other

## 2016-12-31 ENCOUNTER — Encounter: Payer: Medicaid Other | Admitting: Certified Nurse Midwife

## 2016-12-31 ENCOUNTER — Ambulatory Visit (INDEPENDENT_AMBULATORY_CARE_PROVIDER_SITE_OTHER): Payer: Medicaid Other | Admitting: Obstetrics and Gynecology

## 2016-12-31 VITALS — BP 133/85 | HR 97 | Wt 181.0 lb

## 2016-12-31 DIAGNOSIS — O09522 Supervision of elderly multigravida, second trimester: Secondary | ICD-10-CM

## 2016-12-31 DIAGNOSIS — Z23 Encounter for immunization: Secondary | ICD-10-CM

## 2016-12-31 DIAGNOSIS — O99282 Endocrine, nutritional and metabolic diseases complicating pregnancy, second trimester: Secondary | ICD-10-CM

## 2016-12-31 DIAGNOSIS — O10919 Unspecified pre-existing hypertension complicating pregnancy, unspecified trimester: Secondary | ICD-10-CM

## 2016-12-31 DIAGNOSIS — O09529 Supervision of elderly multigravida, unspecified trimester: Secondary | ICD-10-CM

## 2016-12-31 DIAGNOSIS — E0590 Thyrotoxicosis, unspecified without thyrotoxic crisis or storm: Secondary | ICD-10-CM

## 2016-12-31 DIAGNOSIS — O10912 Unspecified pre-existing hypertension complicating pregnancy, second trimester: Secondary | ICD-10-CM

## 2016-12-31 NOTE — Patient Instructions (Signed)
Tdap Vaccine (Tetanus, Diphtheria and Pertussis): What You Need to Know 1. Why get vaccinated? Tetanus, diphtheria and pertussis are very serious diseases. Tdap vaccine can protect us from these diseases. And, Tdap vaccine given to pregnant women can protect newborn babies against pertussis. TETANUS (Lockjaw) is rare in the [place] today. It causes painful muscle tightening and stiffness, usually all over the body.  It can lead to tightening of muscles in the head and neck so you can't open your mouth, swallow, or sometimes even breathe. Tetanus kills about 1 out of 10 people who are infected even after receiving the best medical care.  DIPHTHERIA is also rare in the [place] today. It can cause a thick coating to form in the back of the throat.  It can lead to breathing problems, heart failure, paralysis, and death.  PERTUSSIS (Whooping Cough) causes severe coughing spells, which can cause difficulty breathing, vomiting and disturbed sleep.  It can also lead to weight loss, incontinence, and rib fractures. Up to 2 in 100 adolescents and 5 in 100 adults with pertussis are hospitalized or have complications, which could include pneumonia or death.  These diseases are caused by bacteria. Diphtheria and pertussis are spread from person to person through secretions from coughing or sneezing. Tetanus enters the body through cuts, scratches, or wounds. Before vaccines, as many as 200,000 cases of diphtheria, 200,000 cases of pertussis, and hundreds of cases of tetanus, were reported in the [place] each year. Since vaccination began, reports of cases for tetanus and diphtheria have dropped by about 99% and for pertussis by about 80%. 2. Tdap vaccine Tdap vaccine can protect adolescents and adults from tetanus, diphtheria, and pertussis. One dose of Tdap is routinely given at age 11 or 12. People who did not get Tdap at that age should get it as soon as possible. Tdap is especially  important for healthcare professionals and anyone having close contact with a baby younger than 12 months. Pregnant women should get a dose of Tdap during every pregnancy, to protect the newborn from pertussis. Infants are most at risk for severe, life-threatening complications from pertussis. Another vaccine, called Td, protects against tetanus and diphtheria, but not pertussis. A Td booster should be given every 10 years. Tdap may be given as one of these boosters if you have never gotten Tdap before. Tdap may also be given after a severe cut or burn to prevent tetanus infection. Your doctor or the person giving you the vaccine can give you more information. Tdap may safely be given at the same time as other vaccines. 3. Some people should not get this vaccine  A person who has ever had a life-threatening allergic reaction after a previous dose of any diphtheria, tetanus or pertussis containing vaccine, OR has a severe allergy to any part of this vaccine, should not get Tdap vaccine. Tell the person giving the vaccine about any severe allergies.  Anyone who had coma or long repeated seizures within 7 days after a childhood dose of DTP or DTaP, or a previous dose of Tdap, should not get Tdap, unless a cause other than the vaccine was found. They can still get Td.  Talk to your doctor if you: ? have seizures or another nervous system problem, ? had severe pain or swelling after any vaccine containing diphtheria, tetanus or pertussis, ? ever had a condition called Guillain-Barr Syndrome (GBS), ? aren't feeling well on the day the shot is scheduled. 4. Risks With any medicine, including   vaccines, there is a chance of side effects. These are usually mild and go away on their own. Serious reactions are also possible but are rare. Most people who get Tdap vaccine do not have any problems with it. Mild problems following Tdap: (Did not interfere with activities)  Pain where the shot was given (about  3 in 4 adolescents or 2 in 3 adults)  Redness or swelling where the shot was given (about 1 person in 5)  Mild fever of at least 100.4F (up to about 1 in 25 adolescents or 1 in 100 adults)  Headache (about 3 or 4 people in 10)  Tiredness (about 1 person in 3 or 4)  Nausea, vomiting, diarrhea, stomach ache (up to 1 in 4 adolescents or 1 in 10 adults)  Chills, sore joints (about 1 person in 10)  Body aches (about 1 person in 3 or 4)  Rash, swollen glands (uncommon)  Moderate problems following Tdap: (Interfered with activities, but did not require medical attention)  Pain where the shot was given (up to 1 in 5 or 6)  Redness or swelling where the shot was given (up to about 1 in 16 adolescents or 1 in 12 adults)  Fever over 102F (about 1 in 100 adolescents or 1 in 250 adults)  Headache (about 1 in 7 adolescents or 1 in 10 adults)  Nausea, vomiting, diarrhea, stomach ache (up to 1 or 3 people in 100)  Swelling of the entire arm where the shot was given (up to about 1 in 500).  Severe problems following Tdap: (Unable to perform usual activities; required medical attention)  Swelling, severe pain, bleeding and redness in the arm where the shot was given (rare).  Problems that could happen after any vaccine:  People sometimes faint after a medical procedure, including vaccination. Sitting or lying down for about 15 minutes can help prevent fainting, and injuries caused by a fall. Tell your doctor if you feel dizzy, or have vision changes or ringing in the ears.  Some people get severe pain in the shoulder and have difficulty moving the arm where a shot was given. This happens very rarely.  Any medication can cause a severe allergic reaction. Such reactions from a vaccine are very rare, estimated at fewer than 1 in a million doses, and would happen within a few minutes to a few hours after the vaccination. As with any medicine, there is a very remote chance of a vaccine  causing a serious injury or death. The safety of vaccines is always being monitored. For more information, visit: www.cdc.gov/vaccinesafety/ 5. What if there is a serious problem? What should I look for? Look for anything that concerns you, such as signs of a severe allergic reaction, very high fever, or unusual behavior. Signs of a severe allergic reaction can include hives, swelling of the face and throat, difficulty breathing, a fast heartbeat, dizziness, and weakness. These would usually start a few minutes to a few hours after the vaccination. What should I do?  If you think it is a severe allergic reaction or other emergency that can't wait, call 9-1-1 or get the person to the nearest hospital. Otherwise, call your doctor.  Afterward, the reaction should be reported to the Vaccine Adverse Event Reporting System (VAERS). Your doctor might file this report, or you can do it yourself through the VAERS web site at www.vaers.hhs.gov, or by calling 1-800-822-7967. ? VAERS does not give medical advice. 6. The National Vaccine Injury Compensation Program The National   Vaccine Injury Compensation Program (VICP) is a federal program that was created to compensate people who may have been injured by certain vaccines. Persons who believe they may have been injured by a vaccine can learn about the program and about filing a claim by calling 1-800-338-2382 or visiting the VICP website at www.hrsa.gov/vaccinecompensation. There is a time limit to file a claim for compensation. 7. How can I learn more?  Ask your doctor. He or she can give you the vaccine package insert or suggest other sources of information.  Call your local or state health department.  Contact the Centers for Disease Control and Prevention (CDC): ? Call 1-800-232-4636 (1-800-CDC-INFO) or ? Visit CDC's website at www.cdc.gov/vaccines CDC Tdap Vaccine VIS (05/02/13) This information is not intended to replace advice given to you by your  health care provider. Make sure you discuss any questions you have with your health care provider. Document Released: 08/25/2011 Document Revised: 11/14/2015 Document Reviewed: 11/14/2015 Elsevier Interactive Patient Education  2017 Elsevier Inc.  

## 2016-12-31 NOTE — Progress Notes (Signed)
   PRENATAL VISIT NOTE  Subjective:  Sue [name] is a 41 y.o. [id number] at [redacted]w[redacted]d being seen today for ongoing prenatal care.  She is currently monitored for the following issues for this low-risk pregnancy and has Essential hypertension; HERNIATED DISC; Carpal tunnel syndrome; Sacroiliac dysfunction; Mid back pain; Seasonal allergies; Acute anterior uveitis; Healthcare maintenance; Fatty liver; Hiatal hernia; Overweight (BMI 25.0-29.9); Alopecia; Advanced maternal age in multigravida, unspecified trimester; Encounter for supervision of high risk multigravida of advanced maternal age, antepartum; Chronic hypertension during pregnancy, antepartum; Unwanted fertility; History of ELISA positive for HSV; Hyperthyroidism affecting pregnancy; and GERD (gastroesophageal reflux disease) on her problem list.  Patient reports no complaints.  Contractions: Irregular. Vag. Bleeding: None.  Movement: Present. Denies leaking of fluid.   The following portions of the patient's history were reviewed and updated as appropriate: allergies, current medications, past family history, past medical history, past social history, past surgical history and problem list. Problem list updated.  Objective:   Vitals:   12/31/16 0910  BP: 133/85  Pulse: 97  Weight: 181 lb (82.1 kg)    Fetal Status: Fetal Heart Rate (bpm): 152 Fundal Height: 26 cm Movement: Present     General:  Alert, oriented and cooperative. Patient is in no acute distress.  Skin: Skin is warm and dry. No rash noted.   Cardiovascular: Normal heart rate noted  Respiratory: Normal respiratory effort, no problems with respiration noted  Abdomen: Soft, gravid, appropriate for gestational age.  Pain/Pressure: Present     Pelvic: Cervical exam deferred        Extremities: Normal range of motion.  Edema: Trace  Mental Status:  Normal mood and affect. Normal behavior. Normal judgment and thought content.   Assessment and Plan:  Pregnancy: [id number] at  [redacted]w[redacted]d  1. Need for tetanus, diphtheria, and acellular pertussis (Tdap) vaccine in patient of adolescent age or older - Tdap vaccine greater than or equal to 7yo IM  2. Encounter for supervision of high risk multigravida of advanced maternal age, antepartum Patient is doing well without complaints Third trimester labs today - Glucose Tolerance, 2 Hours w/1 Hour - CBC - HIV antibody (with reflex) - RPR  3. Chronic hypertension during pregnancy, antepartum BP well controlled on Norvasc Follow up growth ultrasound on 11/1  4. Advanced maternal age in multigravida, unspecified trimester Normal NIPS  5. Hyperthyroidism affecting pregnancy in second trimester Stable on meds Follow up with internist on 11/5  Preterm labor symptoms and general obstetric precautions including but not limited to vaginal bleeding, contractions, leaking of fluid and fetal movement were reviewed in detail with the patient. Please refer to After Visit Summary for other counseling recommendations.  No Follow-up on file.   Catalina Antigua, MD

## 2017-01-01 LAB — CBC
Hematocrit: 34.9 % (ref 34.0–46.6)
Hemoglobin: 12 g/dL (ref 11.1–15.9)
MCH: 30.5 pg (ref 26.6–33.0)
MCHC: 34.4 g/dL (ref 31.5–35.7)
MCV: 89 fL (ref 79–97)
Platelets: 287 10*3/uL (ref 150–379)
RBC: 3.94 x10E6/uL (ref 3.77–5.28)
RDW: 15.7 % — ABNORMAL HIGH (ref 12.3–15.4)
WBC: 7.5 10*3/uL (ref 3.4–10.8)

## 2017-01-01 LAB — HIV ANTIBODY (ROUTINE TESTING W REFLEX): HIV Screen 4th Generation wRfx: NONREACTIVE

## 2017-01-01 LAB — GLUCOSE TOLERANCE, 2 HOURS W/ 1HR
Glucose, 1 hour: 114 mg/dL (ref 65–179)
Glucose, 2 hour: 123 mg/dL (ref 65–152)
Glucose, Fasting: 83 mg/dL (ref 65–91)

## 2017-01-01 LAB — RPR: RPR Ser Ql: NONREACTIVE

## 2017-01-05 ENCOUNTER — Telehealth: Payer: Self-pay | Admitting: Pediatrics

## 2017-01-05 NOTE — Telephone Encounter (Signed)
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 She states she felt them 1-2x/hr a few times today. She reports going home and laying down helped, has not happened since she left work this afternoon.  She denies cramping, vaginal discharge/vaginal bleeding, pain/pressure in vagina or rectum, and low back pain.  She states she feels fine otherwise.  I advised her to lay down on left side and drink 2-3 glasses of juice or water, if she felt the "contractions" again/they worsened, or she had any of the above listed symptoms to go to [facility]. She voiced understanding.

## 2017-01-07 ENCOUNTER — Encounter (HOSPITAL_COMMUNITY): Payer: Self-pay

## 2017-01-07 ENCOUNTER — Ambulatory Visit (HOSPITAL_COMMUNITY)
Admission: RE | Admit: 2017-01-07 | Discharge: 2017-01-07 | Disposition: A | Discharge status: Home / Self Care | Payer: Medicaid Other | Source: Ambulatory Visit | Attending: Certified Nurse Midwife | Admitting: Certified Nurse Midwife

## 2017-01-07 ENCOUNTER — Ambulatory Visit: Payer: Medicaid Other | Admitting: Endocrinology

## 2017-01-07 ENCOUNTER — Other Ambulatory Visit (HOSPITAL_COMMUNITY): Payer: Self-pay | Admitting: Maternal and Fetal Medicine

## 2017-01-07 DIAGNOSIS — E0590 Thyrotoxicosis, unspecified without thyrotoxic crisis or storm: Secondary | ICD-10-CM | POA: Diagnosis not present

## 2017-01-07 DIAGNOSIS — O10919 Unspecified pre-existing hypertension complicating pregnancy, unspecified trimester: Secondary | ICD-10-CM

## 2017-01-07 DIAGNOSIS — O99282 Endocrine, nutritional and metabolic diseases complicating pregnancy, second trimester: Secondary | ICD-10-CM | POA: Insufficient documentation

## 2017-01-07 DIAGNOSIS — O99280 Endocrine, nutritional and metabolic diseases complicating pregnancy, unspecified trimester: Secondary | ICD-10-CM

## 2017-01-07 DIAGNOSIS — O09522 Supervision of elderly multigravida, second trimester: Secondary | ICD-10-CM | POA: Diagnosis present

## 2017-01-07 DIAGNOSIS — O10012 Pre-existing essential hypertension complicating pregnancy, second trimester: Secondary | ICD-10-CM | POA: Diagnosis not present

## 2017-01-07 DIAGNOSIS — Z3A27 27 weeks gestation of pregnancy: Secondary | ICD-10-CM | POA: Diagnosis not present

## 2017-01-08 ENCOUNTER — Other Ambulatory Visit (HOSPITAL_COMMUNITY): Payer: Self-pay | Admitting: *Deleted

## 2017-01-08 DIAGNOSIS — O09523 Supervision of elderly multigravida, third trimester: Secondary | ICD-10-CM

## 2017-01-11 ENCOUNTER — Encounter: Payer: Self-pay | Admitting: Endocrinology

## 2017-01-11 ENCOUNTER — Ambulatory Visit: Payer: Medicaid Other | Admitting: Endocrinology

## 2017-01-11 VITALS — BP 128/72 | HR 97 | Wt 182.0 lb

## 2017-01-11 DIAGNOSIS — E0590 Thyrotoxicosis, unspecified without thyrotoxic crisis or storm: Secondary | ICD-10-CM

## 2017-01-11 DIAGNOSIS — O99282 Endocrine, nutritional and metabolic diseases complicating pregnancy, second trimester: Secondary | ICD-10-CM

## 2017-01-11 NOTE — Patient Instructions (Signed)
blood tests are requested for you today.  We'll let you know about the results.  If ever you have fever while taking methimazole, stop it and call us, even if the reason is obvious, because of the risk of a rare side-effect.   Please come back for a follow-up appointment in 5 weeks.

## 2017-01-11 NOTE — Progress Notes (Signed)
Subjective:    Patient ID: Sue [name], female    DOB: [birth year], 41 y.o.   MRN: [id number]  HPI Pt returns for f/u of hyperthyroidism (dx'ed 2010; she has never had thyroid imaging, but PE shows nodules; she is [redacted] weeks pregnant; sh has only gained 8 lbs so far; she had minimal n/v early in the pregnancy; despite the fact that hyperthyroidism is mild she is rx'ed with tapazole, due to poor wt gain). She is at 28 weeks now.  She has gained 3 more lbs, since last ov.  pt states she feels well, except for occasional palpitations in the chest.   Past Medical History:  Diagnosis Date  . Asthma    mild by PFT's 03/16/1998  . Cervical lymphadenopathy    biopsy negativeMarvetta Gibbons)  . Chronic hypertension   . Complete spontaneous abortion    x2  . GERD (gastroesophageal reflux disease)   . History of abnormal Pap smear    s/p cryo( Dr. Wiliam Ke)  . HSV (herpes simplex virus) anogenital infection   . Hypertension   . Kidney stone   . Normal vaginal delivery    x3  . Rheumatoid arthritis (HCC)   . Sciatica 01/12/06  . 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   . Vaginal Pap smear, abnormal     Past Surgical History:  Procedure Laterality Date  . biopsy of lymph node in R neck  2007.  Marland Kitchen LEEP      Social History   Socioeconomic History  . Marital status: Single    Spouse name: Not on file  . Number of children: Not on file  . Years of education: Not on file  . Highest education level: Not on file  Social Needs  . Financial resource strain: Not on file  . Food insecurity - worry: Not on file  . Food insecurity - inability: Not on file  . Transportation needs - medical: Not on file  . Transportation needs - non-medical: Not on file  Occupational History  . Not on file  Tobacco Use  . Smoking status: Never Smoker  . Smokeless  tobacco: Never Used  Substance and Sexual Activity  . Alcohol use: No    Alcohol/week: 0.0 oz    Comment: occasional beer and liquor on weekends  . Drug use: No  . Sexual activity: Yes    Birth control/protection: None  Other Topics Concern  . Not on file  Social History Narrative  . Not on file    Current Outpatient Medications on File Prior to Visit  Medication Sig Dispense Refill  . acetaminophen (TYLENOL) 500 MG tablet Take 1,000 mg by mouth every 6 (six) hours as needed for mild pain, moderate pain or headache.    . albuterol (PROVENTIL HFA;VENTOLIN HFA) 108 (90 Base) MCG/ACT inhaler Inhale 1-2 puffs into the lungs every 6 (six) hours as needed for wheezing or shortness of breath. 1 Inhaler 0  . amLODipine (NORVASC) 5 MG tablet take 1 tablet by mouth once daily 30 tablet 11  . calcium carbonate (TUMS - DOSED IN MG ELEMENTAL CALCIUM) 500 MG chewable tablet Chew 1 tablet by mouth daily.    . cetirizine (ZYRTEC) 10 MG tablet Take 1 tablet (10 mg total) by mouth daily. 30 tablet 6  . Cetirizine HCl (ZYRTEC ALLERGY PO) Take by mouth.    Marland Kitchen HUMIRA PEN  40 MG/0.8ML PNKT   0  . mesalamine (CANASA) 1000 MG suppository Place 1,000 mg rectally as needed.    . metoCLOPramide (REGLAN) 5 MG tablet Take 1 tablet (5 mg total) by mouth 4 (four) times daily -  before meals and at bedtime. 120 tablet 2  . pantoprazole (PROTONIX) 40 MG tablet Take 40 mg by mouth daily.  1  . Prenatal Vit w/Fe-Methylfol-FA (PNV PO) Take by mouth.    . [DISCONTINUED] levonorgestrel-ethinyl estradiol (SEASONALE,INTROVALE,JOLESSA) 0.15-0.03 MG tablet Take 1 tablet by mouth daily. (Patient not taking: Reported on 07/27/2015) 1 Package 3  . [DISCONTINUED] loratadine (CLARITIN) 10 MG tablet Take 1 tablet (10 mg total) by mouth daily. (Patient not taking: Reported on 07/27/2015) 30 tablet 0   No current facility-administered medications on file prior to visit.     Allergies  Allergen Reactions  . Ciprofloxacin Swelling     Throat swells  . Clindamycin/Lincomycin Swelling  . Sulfamethoxazole-Trimethoprim Swelling    Swelling is of the lips. Swelling is of the lips.  . Amoxicillin Rash    Has patient had a PCN reaction causing immediate rash, facial/tongue/throat swelling, SOB or lightheadedness with hypotension: no Has patient had a PCN reaction causing severe rash involving mucus membranes or skin necrosis: pt had rash -- non severe  Has patient had a PCN reaction that required hospitalization: no Has patient had a PCN reaction occurring within the last 10 years: no If all of the above answers are "NO", then may proceed with Cephalosporin use.     Family History  Problem Relation Age of Onset  . Ulcerative colitis Mother   . Hypertension Mother   . Diabetes Father   . Heart attack Father   . Hypertension Father   . Diabetes Sister   . Thyroid cancer Sister   . Heart attack Maternal Grandmother   . Stroke Maternal Grandmother     BP 128/72   Pulse 97   Wt 182 lb (82.6 kg)   LMP 06/29/2016 (Approximate)   SpO2 97%   BMI 31.24 kg/m    Review of Systems Denies fever    Objective:   Physical Exam VS: see vs page GEN: no distress HEAD: head: no deformity eyes: no periorbital swelling, no proptosis external nose and ears are normal mouth: no lesion seen NECK: a healed scar is present (LN resection in 2006).  I do not appreciate a nodule in the thyroid or elsewhere in the neck   Lab Results  Component Value Date   TSH 0.28 (L) 01/11/2017   T4TOTAL 12.3 (H) 10/06/2016      Assessment & Plan:  Hyperthyroidism: well-controlled Pregnancy: we should minimize tapazole.  Reduce to BIW.  Patient Instructions  blood tests are requested for you today.  We'll let you know about the results.  If ever you have fever while taking methimazole, stop it and call us, even if the reason is obvious, because of the risk of a rare side-effect.   Please come back for a follow-up appointment in 5 weeks.

## 2017-01-12 LAB — T4, FREE: Free T4: 0.63 ng/dL (ref 0.60–1.60)

## 2017-01-12 LAB — TSH: TSH: 0.28 u[IU]/mL — ABNORMAL LOW (ref 0.35–4.50)

## 2017-01-12 MED ORDER — METHIMAZOLE 5 MG PO TABS: 5.0000 mg | ORAL_TABLET | ORAL | 3 refills | Status: DC

## 2017-01-14 ENCOUNTER — Encounter: Payer: Self-pay | Admitting: Obstetrics and Gynecology

## 2017-01-14 ENCOUNTER — Ambulatory Visit (INDEPENDENT_AMBULATORY_CARE_PROVIDER_SITE_OTHER): Payer: Medicaid Other | Admitting: Obstetrics and Gynecology

## 2017-01-14 VITALS — BP 129/78 | HR 93 | Wt 182.0 lb

## 2017-01-14 DIAGNOSIS — O10919 Unspecified pre-existing hypertension complicating pregnancy, unspecified trimester: Secondary | ICD-10-CM

## 2017-01-14 DIAGNOSIS — E0590 Thyrotoxicosis, unspecified without thyrotoxic crisis or storm: Secondary | ICD-10-CM

## 2017-01-14 DIAGNOSIS — O09523 Supervision of elderly multigravida, third trimester: Secondary | ICD-10-CM

## 2017-01-14 DIAGNOSIS — O09529 Supervision of elderly multigravida, unspecified trimester: Secondary | ICD-10-CM

## 2017-01-14 DIAGNOSIS — O10913 Unspecified pre-existing hypertension complicating pregnancy, third trimester: Secondary | ICD-10-CM

## 2017-01-14 DIAGNOSIS — O99283 Endocrine, nutritional and metabolic diseases complicating pregnancy, third trimester: Secondary | ICD-10-CM

## 2017-01-14 MED ORDER — COMFORT FIT MATERNITY SUPP MED MISC: 0 refills | Status: DC

## 2017-01-14 NOTE — Progress Notes (Signed)
   PRENATAL VISIT NOTE  Subjective:  Sue [name] is a 41 y.o. [id number] at [redacted]w[redacted]d being seen today for ongoing prenatal care.  She is currently monitored for the following issues for this high-risk pregnancy and has Essential hypertension; HERNIATED DISC; Carpal tunnel syndrome; Sacroiliac dysfunction; Mid back pain; Seasonal allergies; Acute anterior uveitis; Healthcare maintenance; Fatty liver; Hiatal hernia; Overweight (BMI 25.0-29.9); Alopecia; Advanced maternal age in multigravida, unspecified trimester; Encounter for supervision of high risk multigravida of advanced maternal age, antepartum; Chronic hypertension during pregnancy, antepartum; Unwanted fertility; History of ELISA positive for HSV; Hyperthyroidism affecting pregnancy; and GERD (gastroesophageal reflux disease) on their problem list.  Patient reports backache.  Contractions: Irregular. Vag. Bleeding: None.  Movement: Present. Denies leaking of fluid.   The following portions of the patient's history were reviewed and updated as appropriate: allergies, current medications, past family history, past medical history, past social history, past surgical history and problem list. Problem list updated.  Objective:   Vitals:   01/14/17 1551  BP: 129/78  Pulse: 93  Weight: 182 lb (82.6 kg)    Fetal Status: Fetal Heart Rate (bpm): 150 Fundal Height: 28 cm Movement: Present     General:  Alert, oriented and cooperative. Patient is in no acute distress.  Skin: Skin is warm and dry. No rash noted.   Cardiovascular: Normal heart rate noted  Respiratory: Normal respiratory effort, no problems with respiration noted  Abdomen: Soft, gravid, appropriate for gestational age.  Pain/Pressure: Present     Pelvic: Cervical exam deferred        Extremities: Normal range of motion.  Edema: Trace  Mental Status:  Normal mood and affect. Normal behavior. Normal judgment and thought content.   Assessment and Plan:  Pregnancy: [id number] at [redacted]w[redacted]d  1.  Encounter for supervision of high risk multigravida of advanced maternal age, antepartum Patient is doing well without complaints Rx maternity belt provided to help with back discomfort  2. Hyperthyroidism affecting pregnancy in third trimester Stable on medication Dose recently adjusted Follow up growth ultrasound 11/30  3. Advanced maternal age in multigravida, unspecified trimester Low risk genetic screening test  4. Chronic hypertension during pregnancy, antepartum Stable on labetalol Continue ASA  Preterm labor symptoms and general obstetric precautions including but not limited to vaginal bleeding, contractions, leaking of fluid and fetal movement were reviewed in detail with the patient. Please refer to After Visit Summary for other counseling recommendations.  Return in about 2 weeks (around 01/28/2017) for ROB.   Catalina Antigua, MD

## 2017-02-01 ENCOUNTER — Ambulatory Visit (INDEPENDENT_AMBULATORY_CARE_PROVIDER_SITE_OTHER): Payer: Medicaid Other | Admitting: Obstetrics and Gynecology

## 2017-02-01 ENCOUNTER — Encounter: Payer: Self-pay | Admitting: Obstetrics and Gynecology

## 2017-02-01 VITALS — BP 115/72 | HR 98 | Wt 182.0 lb

## 2017-02-01 DIAGNOSIS — M069 Rheumatoid arthritis, unspecified: Secondary | ICD-10-CM | POA: Insufficient documentation

## 2017-02-01 DIAGNOSIS — E0590 Thyrotoxicosis, unspecified without thyrotoxic crisis or storm: Secondary | ICD-10-CM

## 2017-02-01 DIAGNOSIS — O10913 Unspecified pre-existing hypertension complicating pregnancy, third trimester: Secondary | ICD-10-CM

## 2017-02-01 DIAGNOSIS — O09529 Supervision of elderly multigravida, unspecified trimester: Secondary | ICD-10-CM

## 2017-02-01 DIAGNOSIS — M0520 Rheumatoid vasculitis with rheumatoid arthritis of unspecified site: Secondary | ICD-10-CM | POA: Insufficient documentation

## 2017-02-01 DIAGNOSIS — O10919 Unspecified pre-existing hypertension complicating pregnancy, unspecified trimester: Secondary | ICD-10-CM

## 2017-02-01 DIAGNOSIS — O99283 Endocrine, nutritional and metabolic diseases complicating pregnancy, third trimester: Secondary | ICD-10-CM

## 2017-02-01 DIAGNOSIS — O09523 Supervision of elderly multigravida, third trimester: Secondary | ICD-10-CM

## 2017-02-01 NOTE — Progress Notes (Signed)
Subjective:  Jackilyn Umphlett is a 41 y.o. [id number] at [redacted]w[redacted]d being seen today for ongoing prenatal care.  She is currently monitored for the following issues for this high-risk pregnancy and has Essential hypertension; HERNIATED DISC; Carpal tunnel syndrome; Sacroiliac dysfunction; Mid back pain; Seasonal allergies; Acute anterior uveitis; Healthcare maintenance; Fatty liver; Hiatal hernia; Overweight (BMI 25.0-29.9); Alopecia; Advanced maternal age in multigravida, unspecified trimester; Encounter for supervision of high risk multigravida of advanced maternal age, antepartum; Chronic hypertension during pregnancy, antepartum; History of ELISA positive for HSV; Hyperthyroidism affecting pregnancy; GERD (gastroesophageal reflux disease); and Rheumatoid arteritis on their problem list.  Patient reports difficulty sleeping.  Contractions: Irregular. Vag. Bleeding: None.  Movement: Present. Denies leaking of fluid.   The following portions of the patient's history were reviewed and updated as appropriate: allergies, current medications, past family history, past medical history, past social history, past surgical history and problem list. Problem list updated.  Objective:   Vitals:   02/01/17 1536  BP: 115/72  Pulse: 98  Weight: 182 lb (82.6 kg)    Fetal Status: Fetal Heart Rate (bpm): 135   Movement: Present     General:  Alert, oriented and cooperative. Patient is in no acute distress.  Skin: Skin is warm and dry. No rash noted.   Cardiovascular: Normal heart rate noted  Respiratory: Normal respiratory effort, no problems with respiration noted  Abdomen: Soft, gravid, appropriate for gestational age. Pain/Pressure: Present     Pelvic:  Cervical exam deferred        Extremities: Normal range of motion.  Edema: Mild pitting, slight indentation  Mental Status: Normal mood and affect. Normal behavior. Normal judgment and thought content.   Urinalysis:      Assessment and Plan:  Pregnancy: [id number] at  101w0d  1. Hyperthyroidism affecting pregnancy in third trimester Stable on Tapazole  2. Chronic hypertension during pregnancy, antepartum Stable on Norvasc Unable to tolerate BASA d/t GI side effects Good growth on last U/S f/u this week Will start antenatal testing  3. Advanced maternal age in multigravida, unspecified trimester   4. Encounter for supervision of high risk multigravida of advanced maternal age, antepartum Stable  5. Rheumatoid arthritis, involving unspecified site, unspecified rheumatoid factor presence (HCC) Stable on Humaria  Preterm labor symptoms and general obstetric precautions including but not limited to vaginal bleeding, contractions, leaking of fluid and fetal movement were reviewed in detail with the patient. Please refer to After Visit Summary for other counseling recommendations.  Return in about 1 week (around 02/08/2017) for OB visit.   Hermina Staggers, MD

## 2017-02-01 NOTE — Progress Notes (Signed)
Pt c/o not having energy and not getting any sleep. Pt requests Ambien.

## 2017-02-04 ENCOUNTER — Encounter: Payer: Self-pay | Admitting: *Deleted

## 2017-02-04 ENCOUNTER — Ambulatory Visit (HOSPITAL_COMMUNITY): Payer: Medicaid Other

## 2017-02-05 ENCOUNTER — Ambulatory Visit (HOSPITAL_COMMUNITY)
Admission: RE | Admit: 2017-02-05 | Discharge: 2017-02-05 | Disposition: A | Discharge status: Home / Self Care | Payer: Medicaid Other | Source: Ambulatory Visit | Attending: Certified Nurse Midwife | Admitting: Certified Nurse Midwife

## 2017-02-05 DIAGNOSIS — O09213 Supervision of pregnancy with history of pre-term labor, third trimester: Secondary | ICD-10-CM | POA: Diagnosis not present

## 2017-02-05 DIAGNOSIS — O10013 Pre-existing essential hypertension complicating pregnancy, third trimester: Secondary | ICD-10-CM | POA: Insufficient documentation

## 2017-02-05 DIAGNOSIS — E0590 Thyrotoxicosis, unspecified without thyrotoxic crisis or storm: Secondary | ICD-10-CM | POA: Diagnosis not present

## 2017-02-05 DIAGNOSIS — O09523 Supervision of elderly multigravida, third trimester: Secondary | ICD-10-CM | POA: Diagnosis not present

## 2017-02-05 DIAGNOSIS — Z3A31 31 weeks gestation of pregnancy: Secondary | ICD-10-CM | POA: Diagnosis not present

## 2017-02-05 DIAGNOSIS — O99283 Endocrine, nutritional and metabolic diseases complicating pregnancy, third trimester: Secondary | ICD-10-CM | POA: Diagnosis not present

## 2017-02-08 ENCOUNTER — Encounter: Payer: Medicaid Other | Admitting: Obstetrics and Gynecology

## 2017-02-09 ENCOUNTER — Ambulatory Visit (INDEPENDENT_AMBULATORY_CARE_PROVIDER_SITE_OTHER): Payer: Medicaid Other | Admitting: *Deleted

## 2017-02-09 ENCOUNTER — Ambulatory Visit: Payer: Self-pay

## 2017-02-09 ENCOUNTER — Ambulatory Visit (INDEPENDENT_AMBULATORY_CARE_PROVIDER_SITE_OTHER): Payer: Medicaid Other | Admitting: Obstetrics & Gynecology

## 2017-02-09 VITALS — BP 123/73 | HR 96

## 2017-02-09 DIAGNOSIS — O10913 Unspecified pre-existing hypertension complicating pregnancy, third trimester: Secondary | ICD-10-CM | POA: Diagnosis not present

## 2017-02-09 DIAGNOSIS — O09523 Supervision of elderly multigravida, third trimester: Secondary | ICD-10-CM

## 2017-02-09 DIAGNOSIS — O10919 Unspecified pre-existing hypertension complicating pregnancy, unspecified trimester: Secondary | ICD-10-CM

## 2017-02-09 DIAGNOSIS — O09529 Supervision of elderly multigravida, unspecified trimester: Secondary | ICD-10-CM

## 2017-02-09 NOTE — Patient Instructions (Signed)

## 2017-02-09 NOTE — Progress Notes (Signed)

## 2017-02-09 NOTE — Progress Notes (Signed)
Pt denies concerns at this time. 

## 2017-02-09 NOTE — Progress Notes (Signed)
   PRENATAL VISIT NOTE  Subjective:  Sue [name] is a 41 y.o. [id number] at [redacted]w[redacted]d being seen today for ongoing prenatal care.  She is currently monitored for the following issues for this high-risk pregnancy and has Essential hypertension; HERNIATED DISC; Carpal tunnel syndrome; Sacroiliac dysfunction; Mid back pain; Seasonal allergies; Acute anterior uveitis; Healthcare maintenance; Fatty liver; Hiatal hernia; Overweight (BMI 25.0-29.9); Alopecia; Advanced maternal age in multigravida, unspecified trimester; Encounter for supervision of high risk multigravida of advanced maternal age, antepartum; Chronic hypertension during pregnancy, antepartum; History of ELISA positive for HSV; Hyperthyroidism affecting pregnancy; GERD (gastroesophageal reflux disease); and Rheumatoid arteritis on their problem list.  Patient reports no complaints.  Contractions: Irregular. Vag. Bleeding: None.  Movement: Present. Denies leaking of fluid.   The following portions of the patient's history were reviewed and updated as appropriate: allergies, current medications, past family history, past medical history, past social history, past surgical history and problem list. Problem list updated.  Objective:   Vitals:   02/09/17 1400  BP: 128/83  Pulse: 92  Weight: 181 lb (82.1 kg)    Fetal Status: Fetal Heart Rate (bpm): 140 Fundal Height: 33 cm Movement: Present     General:  Alert, oriented and cooperative. Patient is in no acute distress.  Skin: Skin is warm and dry. No rash noted.   Cardiovascular: Normal heart rate noted  Respiratory: Normal respiratory effort, no problems with respiration noted  Abdomen: Soft, gravid, appropriate for gestational age.  Pain/Pressure: Present     Pelvic: Cervical exam deferred        Extremities: Normal range of motion.  Edema: Trace  Mental Status:  Normal mood and affect. Normal behavior. Normal judgment and thought content.   Assessment and Plan:  Pregnancy: [id number] at  [redacted]w[redacted]d  1. Encounter for supervision of high risk multigravida of advanced maternal age, antepartum Ok to have dental work done  Preterm labor symptoms and general obstetric precautions including but not limited to vaginal bleeding, contractions, leaking of fluid and fetal movement were reviewed in detail with the patient. Please refer to After Visit Summary for other counseling recommendations.  Return in about 1 week (around 02/16/2017). NST reactive and BPP was done today  Scheryl Darter, MD

## 2017-02-11 ENCOUNTER — Encounter: Payer: Medicaid Other | Admitting: Obstetrics and Gynecology

## 2017-02-15 ENCOUNTER — Ambulatory Visit: Payer: Medicaid Other | Admitting: Endocrinology

## 2017-02-16 ENCOUNTER — Ambulatory Visit (INDEPENDENT_AMBULATORY_CARE_PROVIDER_SITE_OTHER): Payer: Medicaid Other | Admitting: *Deleted

## 2017-02-16 ENCOUNTER — Ambulatory Visit: Payer: Self-pay

## 2017-02-16 ENCOUNTER — Ambulatory Visit (INDEPENDENT_AMBULATORY_CARE_PROVIDER_SITE_OTHER): Payer: Medicaid Other | Admitting: Obstetrics & Gynecology

## 2017-02-16 ENCOUNTER — Other Ambulatory Visit: Payer: Self-pay

## 2017-02-16 VITALS — BP 126/78 | HR 98

## 2017-02-16 VITALS — BP 115/72 | HR 96

## 2017-02-16 DIAGNOSIS — O10913 Unspecified pre-existing hypertension complicating pregnancy, third trimester: Secondary | ICD-10-CM

## 2017-02-16 DIAGNOSIS — O99283 Endocrine, nutritional and metabolic diseases complicating pregnancy, third trimester: Secondary | ICD-10-CM

## 2017-02-16 DIAGNOSIS — O10919 Unspecified pre-existing hypertension complicating pregnancy, unspecified trimester: Secondary | ICD-10-CM

## 2017-02-16 DIAGNOSIS — O09523 Supervision of elderly multigravida, third trimester: Secondary | ICD-10-CM

## 2017-02-16 DIAGNOSIS — O09529 Supervision of elderly multigravida, unspecified trimester: Secondary | ICD-10-CM

## 2017-02-16 DIAGNOSIS — E0590 Thyrotoxicosis, unspecified without thyrotoxic crisis or storm: Secondary | ICD-10-CM

## 2017-02-16 NOTE — Patient Instructions (Signed)
Labor Induction Labor induction is when steps are taken to cause a pregnant woman to begin the labor process. Most women go into labor on their own between 37 weeks and 42 weeks of the pregnancy. When this does not happen or when there is a medical need, methods may be used to induce labor. Labor induction causes a pregnant woman's uterus to contract. It also causes the cervix to soften (ripen), open (dilate), and thin out (efface). Usually, labor is not induced before 39 weeks of the pregnancy unless there is a problem with the baby or mother. Before inducing labor, your health care provider will consider a number of factors, including the following:  The medical condition of you and the baby.  How many weeks along you are.  The status of the baby's lung maturity.  The condition of the cervix.  The position of the baby. What are the reasons for labor induction? Labor may be induced for the following reasons:  The health of the baby or mother is at risk.  The pregnancy is overdue by 1 week or more.  The water breaks but labor does not start on its own.  The mother has a health condition or serious illness, such as high blood pressure, infection, placental abruption, or diabetes.  The amniotic fluid amounts are low around the baby.  The baby is distressed. Convenience or wanting the baby to be born on a certain date is not a reason for inducing labor. What methods are used for labor induction? Several methods of labor induction may be used, such as:  Prostaglandin medicine. This medicine causes the cervix to dilate and ripen. The medicine will also start contractions. It can be taken by mouth or by inserting a suppository into the vagina.  Inserting a thin tube (catheter) with a balloon on the end into the vagina to dilate the cervix. Once inserted, the balloon is expanded with water, which causes the cervix to open.  Stripping the membranes. Your health care provider separates  amniotic sac tissue from the cervix, causing the cervix to be stretched and causing the release of a hormone called progesterone. This may cause the uterus to contract. It is often done during an office visit. You will be sent home to wait for the contractions to begin. You will then come in for an induction.  Breaking the water. Your health care provider makes a hole in the amniotic sac using a small instrument. Once the amniotic sac breaks, contractions should begin. This may still take hours to see an effect.  Medicine to trigger or strengthen contractions. This medicine is given through an IV access tube inserted into a vein in your arm. All of the methods of induction, besides stripping the membranes, will be done in the hospital. Induction is done in the hospital so that you and the baby can be carefully monitored. How long does it take for labor to be induced? Some inductions can take up to 2-3 days. Depending on the cervix, it usually takes less time. It takes longer when you are induced early in the pregnancy or if this is your first pregnancy. If a mother is still pregnant and the induction has been going on for 2-3 days, either the mother will be sent home or a cesarean delivery will be needed. What are the risks associated with labor induction? Some of the risks of induction include:  Changes in fetal heart rate, such as too high, too low, or erratic.  Fetal distress.    Chance of infection for the mother and baby.  Increased chance of having a cesarean delivery.  Breaking off (abruption) of the placenta from the uterus (rare).  Uterine rupture (very rare). When induction is needed for medical reasons, the benefits of induction may outweigh the risks. What are some reasons for not inducing labor? Labor induction should not be done if:  It is shown that your baby does not tolerate labor.  You have had previous surgeries on your uterus, such as a myomectomy or the removal of  fibroids.  Your placenta lies very low in the uterus and blocks the opening of the cervix (placenta previa).  Your baby is not in a head-down position.  The umbilical cord drops down into the birth canal in front of the baby. This could cut off the baby's blood and oxygen supply.  You have had a previous cesarean delivery.  There are unusual circumstances, such as the baby being extremely premature. This information is not intended to replace advice given to you by your health care provider. Make sure you discuss any questions you have with your health care provider. Document Released: 07/15/2006 Document Revised: 08/01/2015 Document Reviewed: 09/22/2012 Elsevier Interactive Patient Education  2017 [employer]  

## 2017-02-16 NOTE — Progress Notes (Signed)
   PRENATAL VISIT NOTE  Subjective:  Sue [name] is a 41 y.o. [id number] at [redacted]w[redacted]d being seen today for ongoing prenatal care.  She is currently monitored for the following issues for this high-risk pregnancy and has Essential hypertension; HERNIATED DISC; Carpal tunnel syndrome; Sacroiliac dysfunction; Mid back pain; Seasonal allergies; Acute anterior uveitis; Healthcare maintenance; Fatty liver; Hiatal hernia; Overweight (BMI 25.0-29.9); Alopecia; Advanced maternal age in multigravida, unspecified trimester; Encounter for supervision of high risk multigravida of advanced maternal age, antepartum; Chronic hypertension during pregnancy, antepartum; History of ELISA positive for HSV; Hyperthyroidism affecting pregnancy; GERD (gastroesophageal reflux disease); and Rheumatoid arteritis on their problem list.  Patient reports no complaints.  Contractions: Irritability. Vag. Bleeding: None.  Movement: Present. Denies leaking of fluid.   The following portions of the patient's history were reviewed and updated as appropriate: allergies, current medications, past family history, past medical history, past social history, past surgical history and problem list. Problem list updated.  Objective:   Vitals:   02/16/17 1459  BP: 115/72  Pulse: 96    Fetal Status: Fetal Heart Rate (bpm): 138   Movement: Present     General:  Alert, oriented and cooperative. Patient is in no acute distress.  Skin: Skin is warm and dry. No rash noted.   Cardiovascular: Normal heart rate noted  Respiratory: Normal respiratory effort, no problems with respiration noted  Abdomen: Soft, gravid, appropriate for gestational age.  Pain/Pressure: Present     Pelvic: Cervical exam deferred        Extremities: Normal range of motion.  Edema: Trace  Mental Status:  Normal mood and affect. Normal behavior. Normal judgment and thought content.   Assessment and Plan:  Pregnancy: [id number] at [redacted]w[redacted]d  1. Encounter for supervision of high  risk multigravida of advanced maternal age, antepartum Doing well, had BPP at [facility] today  2. Chronic hypertension during pregnancy, antepartum Normotensive  3. Hyperthyroidism affecting pregnancy in third trimester Last TFT was normal on medication   Preterm labor symptoms and general obstetric precautions including but not limited to vaginal bleeding, contractions, leaking of fluid and fetal movement were reviewed in detail with the patient. Please refer to After Visit Summary for other counseling recommendations.  Return in about 2 weeks (around 03/02/2017).   Scheryl Darter, MD

## 2017-02-16 NOTE — Progress Notes (Signed)
Pt informed that the ultrasound is considered a limited OB ultrasound and is not intended to be a complete ultrasound exam.  Patient also informed that the ultrasound is not being completed with the intent of assessing for fetal or placental anomalies or any pelvic abnormalities.  Explained that the purpose of today's ultrasound is to assess for presentation, BPP and amniotic fluid volume.  Patient acknowledges the purpose of the exam and the limitations of the study.    Prenatal visit appt today @ [facility] w/Dr. Debroah Loop @ [phone]

## 2017-02-16 NOTE — Progress Notes (Signed)
Did NST @ [facility] today.

## 2017-02-23 ENCOUNTER — Ambulatory Visit: Payer: Self-pay

## 2017-02-23 ENCOUNTER — Ambulatory Visit (INDEPENDENT_AMBULATORY_CARE_PROVIDER_SITE_OTHER): Payer: Medicaid Other | Admitting: *Deleted

## 2017-02-23 ENCOUNTER — Telehealth: Payer: Self-pay | Admitting: Pediatrics

## 2017-02-23 ENCOUNTER — Encounter: Payer: Medicaid Other | Admitting: Obstetrics & Gynecology

## 2017-02-23 VITALS — BP 126/81 | HR 94

## 2017-02-23 DIAGNOSIS — O10913 Unspecified pre-existing hypertension complicating pregnancy, third trimester: Secondary | ICD-10-CM

## 2017-02-23 DIAGNOSIS — O10919 Unspecified pre-existing hypertension complicating pregnancy, unspecified trimester: Secondary | ICD-10-CM

## 2017-02-23 DIAGNOSIS — O09523 Supervision of elderly multigravida, third trimester: Secondary | ICD-10-CM

## 2017-02-23 DIAGNOSIS — A6009 Herpesviral infection of other urogenital tract: Secondary | ICD-10-CM

## 2017-02-23 NOTE — Telephone Encounter (Signed)
Pt states she was told to start HSV prophylaxis at 34 week d/t h/o PTL and early induction sch.  She is requesting rx for Valtrex be sent to pharmacy. Please advise.

## 2017-02-24 NOTE — Progress Notes (Signed)
NST performed today was reviewed and was found to be reactive. Subsequent BPP performed today was also reviewed and was found to be 10/10. AFI was also normal. Continue recommended antenatal testing and prenatal care.  Brittnye Josephs, MD, FACOG Obstetrician & Gynecologist, [facility], [facility]   

## 2017-02-26 MED ORDER — VALACYCLOVIR HCL 500 MG PO TABS: 500.0000 mg | ORAL_TABLET | Freq: Every day | ORAL | 1 refills | Status: DC

## 2017-02-26 NOTE — Telephone Encounter (Signed)
Valtrex 500 mg daily for prophylaxis ordered

## 2017-03-03 ENCOUNTER — Encounter: Payer: Self-pay | Admitting: Obstetrics and Gynecology

## 2017-03-03 ENCOUNTER — Ambulatory Visit (INDEPENDENT_AMBULATORY_CARE_PROVIDER_SITE_OTHER): Payer: Medicaid Other | Admitting: Obstetrics and Gynecology

## 2017-03-03 ENCOUNTER — Other Ambulatory Visit (HOSPITAL_COMMUNITY)
Admission: RE | Admit: 2017-03-03 | Discharge: 2017-03-03 | Disposition: A | Discharge status: Home / Self Care | Payer: Medicaid Other | Source: Ambulatory Visit | Attending: Obstetrics and Gynecology | Admitting: Obstetrics and Gynecology

## 2017-03-03 VITALS — BP 126/82 | HR 97 | Wt 180.0 lb

## 2017-03-03 DIAGNOSIS — O09529 Supervision of elderly multigravida, unspecified trimester: Secondary | ICD-10-CM | POA: Insufficient documentation

## 2017-03-03 DIAGNOSIS — I00 Rheumatic fever without heart involvement: Secondary | ICD-10-CM

## 2017-03-03 DIAGNOSIS — E0590 Thyrotoxicosis, unspecified without thyrotoxic crisis or storm: Secondary | ICD-10-CM

## 2017-03-03 DIAGNOSIS — O10919 Unspecified pre-existing hypertension complicating pregnancy, unspecified trimester: Secondary | ICD-10-CM | POA: Diagnosis present

## 2017-03-03 DIAGNOSIS — O99283 Endocrine, nutritional and metabolic diseases complicating pregnancy, third trimester: Secondary | ICD-10-CM

## 2017-03-03 DIAGNOSIS — O10913 Unspecified pre-existing hypertension complicating pregnancy, third trimester: Secondary | ICD-10-CM

## 2017-03-03 DIAGNOSIS — O09523 Supervision of elderly multigravida, third trimester: Secondary | ICD-10-CM

## 2017-03-03 DIAGNOSIS — Z8619 Personal history of other infectious and parasitic diseases: Secondary | ICD-10-CM

## 2017-03-03 DIAGNOSIS — M0520 Rheumatoid vasculitis with rheumatoid arthritis of unspecified site: Secondary | ICD-10-CM

## 2017-03-03 LAB — OB RESULTS CONSOLE GBS: GBS: NEGATIVE

## 2017-03-03 NOTE — Progress Notes (Signed)
Subjective:  Sue [name] is a 41 y.o. [id number] at [redacted]w[redacted]d being seen today for ongoing prenatal care.  She is currently monitored for the following issues for this high-risk pregnancy and has Essential hypertension; HERNIATED DISC; Carpal tunnel syndrome; Sacroiliac dysfunction; Mid back pain; Seasonal allergies; Acute anterior uveitis; Healthcare maintenance; Fatty liver; Hiatal hernia; Overweight (BMI 25.0-29.9); Alopecia; Advanced maternal age in multigravida, unspecified trimester; Encounter for supervision of high risk multigravida of advanced maternal age, antepartum; Chronic hypertension during pregnancy, antepartum; History of ELISA positive for HSV; Hyperthyroidism affecting pregnancy; GERD (gastroesophageal reflux disease); and Rheumatoid arteritis on their problem list.  Patient reports some blurry vision but no HA.  Contractions: Irregular. Vag. Bleeding: None.  Movement: Present. Denies leaking of fluid.   The following portions of the patient's history were reviewed and updated as appropriate: allergies, current medications, past family history, past medical history, past social history, past surgical history and problem list. Problem list updated.  Objective:   Vitals:   03/03/17 1518  BP: (!) 153/96  Pulse: 97  Weight: 180 lb (81.6 kg)    Fetal Status: Fetal Heart Rate (bpm): NST   Movement: Present     General:  Alert, oriented and cooperative. Patient is in no acute distress.  Skin: Skin is warm and dry. No rash noted.   Cardiovascular: Normal heart rate noted  Respiratory: Normal respiratory effort, no problems with respiration noted  Abdomen: Soft, gravid, appropriate for gestational age. Pain/Pressure: Present     Pelvic:  Cervical exam performed        Extremities: Normal range of motion.  Edema: Trace  Mental Status: Normal mood and affect. Normal behavior. Normal judgment and thought content.   Urinalysis:      Assessment and Plan:  Pregnancy: [id number] at [redacted]w[redacted]d  1.  Chronic hypertension during pregnancy, antepartum Will check labs as noted. Repeat BP 126/86. Continue with Norvasc Antenatal testing twice weekly - Fetal nonstress test; Future - CBC - Comprehensive metabolic panel - Protein / creatinine ratio, urine  2. Encounter for supervision of high risk multigravida of advanced maternal age, antepartum Stable Culture collected today  3. Rheumatoid arteritis Stable, on Humaria  4. Hyperthyroidism affecting pregnancy in third trimester Stable on Tapazole. Followed by IM  5. History of ELISA positive for HSV On Valtrex suppression  6. Advanced maternal age in multigravida, unspecified trimester   Preterm labor symptoms and general obstetric precautions including but not limited to vaginal bleeding, contractions, leaking of fluid and fetal movement were reviewed in detail with the patient. Please refer to After Visit Summary for other counseling recommendations.  No Follow-up on file.   Hermina Staggers, MD

## 2017-03-03 NOTE — Addendum Note (Signed)
Addended by: Dalphine Handing on: 03/03/2017 04:06 PM   Modules accepted: Orders

## 2017-03-03 NOTE — Progress Notes (Signed)
Pt c/o blurred vision and swelling during entire pregnancy. Denies HA's, no dizziness.

## 2017-03-03 NOTE — Addendum Note (Signed)
Addended by: Dalphine Handing on: 03/03/2017 04:34 PM   Modules accepted: Orders

## 2017-03-04 LAB — COMPREHENSIVE METABOLIC PANEL
ALT: 13 IU/L (ref 0–32)
AST: 17 IU/L (ref 0–40)
Albumin/Globulin Ratio: 1.3 (ref 1.2–2.2)
Albumin: 3.5 g/dL (ref 3.5–5.5)
Alkaline Phosphatase: 146 IU/L — ABNORMAL HIGH (ref 39–117)
BUN/Creatinine Ratio: 9 (ref 9–23)
BUN: 5 mg/dL — ABNORMAL LOW (ref 6–24)
Bilirubin Total: 0.4 mg/dL (ref 0.0–1.2)
CO2: 22 mmol/L (ref 20–29)
Calcium: 9.3 mg/dL (ref 8.7–10.2)
Chloride: 101 mmol/L (ref 96–106)
Creatinine, Ser: 0.56 mg/dL — ABNORMAL LOW (ref 0.57–1.00)
GFR calc Af Amer: 134 mL/min/{1.73_m2} (ref >59)
GFR calc non Af Amer: 116 mL/min/{1.73_m2} (ref >59)
Globulin, Total: 2.7 g/dL (ref 1.5–4.5)
Glucose: 70 mg/dL (ref 65–99)
Potassium: 4 mmol/L (ref 3.5–5.2)
Sodium: 139 mmol/L (ref 134–144)
Total Protein: 6.2 g/dL (ref 6.0–8.5)

## 2017-03-04 LAB — CBC
Hematocrit: 34.7 % (ref 34.0–46.6)
Hemoglobin: 11.9 g/dL (ref 11.1–15.9)
MCH: 30.4 pg (ref 26.6–33.0)
MCHC: 34.3 g/dL (ref 31.5–35.7)
MCV: 89 fL (ref 79–97)
Platelets: 271 10*3/uL (ref 150–379)
RBC: 3.92 x10E6/uL (ref 3.77–5.28)
RDW: 15.5 % — ABNORMAL HIGH (ref 12.3–15.4)
WBC: 7.9 10*3/uL (ref 3.4–10.8)

## 2017-03-04 LAB — CERVICOVAGINAL ANCILLARY ONLY
Bacterial vaginitis: NEGATIVE
Candida vaginitis: NEGATIVE
Chlamydia: NEGATIVE
Neisseria Gonorrhea: NEGATIVE
Trichomonas: NEGATIVE

## 2017-03-04 LAB — PROTEIN / CREATININE RATIO, URINE
Creatinine, Urine: 179.1 mg/dL
Protein, Ur: 31.6 mg/dL
Protein/Creat Ratio: 176 mg/g creat (ref 0–200)

## 2017-03-05 ENCOUNTER — Other Ambulatory Visit (HOSPITAL_COMMUNITY): Payer: Self-pay | Admitting: Maternal & Fetal Medicine

## 2017-03-05 ENCOUNTER — Ambulatory Visit (HOSPITAL_COMMUNITY)
Admission: RE | Admit: 2017-03-05 | Discharge: 2017-03-05 | Disposition: A | Discharge status: Home / Self Care | Payer: Medicaid Other | Source: Ambulatory Visit | Attending: Certified Nurse Midwife | Admitting: Certified Nurse Midwife

## 2017-03-05 ENCOUNTER — Other Ambulatory Visit: Payer: Self-pay | Admitting: Obstetrics and Gynecology

## 2017-03-05 DIAGNOSIS — O09529 Supervision of elderly multigravida, unspecified trimester: Secondary | ICD-10-CM

## 2017-03-05 DIAGNOSIS — O10913 Unspecified pre-existing hypertension complicating pregnancy, third trimester: Secondary | ICD-10-CM | POA: Insufficient documentation

## 2017-03-05 DIAGNOSIS — Z3A35 35 weeks gestation of pregnancy: Secondary | ICD-10-CM

## 2017-03-05 DIAGNOSIS — O09523 Supervision of elderly multigravida, third trimester: Secondary | ICD-10-CM

## 2017-03-05 DIAGNOSIS — O10919 Unspecified pre-existing hypertension complicating pregnancy, unspecified trimester: Secondary | ICD-10-CM

## 2017-03-05 NOTE — Addendum Note (Signed)
Encounter addended by: Genevie Cheshire, RT on: 03/05/2017 4:10 PM  Actions taken: Imaging Exam ended

## 2017-03-07 LAB — STREP GP B CULTURE+RFLX: Strep Gp B Culture+Rflx: NEGATIVE

## 2017-03-09 NOTE — L&D Delivery Note (Signed)
Delivery Note At 4:59 PM a viable female was delivered via  (Presentation: vertex; OA).  Head delivered OA. No nuchal cord present. Shoulder and body delivered in usual fashion. Infant placed on mother's abdomen, dried and bulb suctioned. Cord clamped x 2 after 1-minute delay, and cut by family member. Cord blood drawn. After 1 minute, the cord was clamped and cut. 40 units of pitocin diluted in 1000cc LR was infused rapidly IV.  The placenta separated spontaneously and delivered via CCT and maternal pushing effort.  It was inspected and appears to be intact with a 3 VC.   APGAR: 9,9; weight pending.   Placenta status: intact.  Cord:3V without complications.  Cord pH: not sent  Anesthesia: epidural Episiotomy:  n/a Lacerations:  Superficial periurethral Suture Repair: n/a Est. Blood Loss (mL):  200  Mom to postpartum.  Baby to Couplet care / Skin to Skin.  Alroy Bailiff 03/30/2017, 5:07 PM

## 2017-03-10 ENCOUNTER — Ambulatory Visit (INDEPENDENT_AMBULATORY_CARE_PROVIDER_SITE_OTHER): Payer: Medicaid Other | Admitting: *Deleted

## 2017-03-10 ENCOUNTER — Ambulatory Visit: Payer: Self-pay

## 2017-03-10 VITALS — BP 119/67 | HR 93

## 2017-03-10 DIAGNOSIS — O10919 Unspecified pre-existing hypertension complicating pregnancy, unspecified trimester: Secondary | ICD-10-CM

## 2017-03-10 DIAGNOSIS — O09523 Supervision of elderly multigravida, third trimester: Secondary | ICD-10-CM

## 2017-03-10 DIAGNOSIS — O10913 Unspecified pre-existing hypertension complicating pregnancy, third trimester: Secondary | ICD-10-CM

## 2017-03-10 NOTE — Progress Notes (Signed)

## 2017-03-11 ENCOUNTER — Encounter: Payer: Self-pay | Admitting: Endocrinology

## 2017-03-11 ENCOUNTER — Ambulatory Visit: Payer: Medicaid Other | Admitting: Endocrinology

## 2017-03-11 VITALS — BP 118/78 | HR 97 | Wt 181.4 lb

## 2017-03-11 DIAGNOSIS — O99283 Endocrine, nutritional and metabolic diseases complicating pregnancy, third trimester: Secondary | ICD-10-CM | POA: Diagnosis not present

## 2017-03-11 DIAGNOSIS — E0590 Thyrotoxicosis, unspecified without thyrotoxic crisis or storm: Secondary | ICD-10-CM | POA: Diagnosis not present

## 2017-03-11 LAB — TSH: TSH: 0.75 u[IU]/mL (ref 0.35–4.50)

## 2017-03-11 LAB — T4, FREE: Free T4: 0.58 ng/dL — ABNORMAL LOW (ref 0.60–1.60)

## 2017-03-11 MED ORDER — METHIMAZOLE 5 MG PO TABS: 5.0000 mg | ORAL_TABLET | ORAL | 3 refills | Status: DC

## 2017-03-11 NOTE — Progress Notes (Signed)
Subjective:    Patient ID: Sue [name], female    DOB: 28-Apr-1975, 42 y.o.   MRN: [id number]  HPI Pt returns for f/u of hyperthyroidism (dx'ed 2010; she has never had thyroid imaging, but PE shows nodules; she is [redacted] weeks pregnant; sh has only gained 7 lbs so far; she had minimal n/v early in the pregnancy; despite the fact that hyperthyroidism is mild she is rx'ed with tapazole, due to poor wt gain). She is at 36 weeks now.  Induction is planned for approx 2 weeks from now.  She has lost 1 lb, since last ov.  pt states she feels well in general.  She takes tapazole 1/week.   Past Medical History:  Diagnosis Date  . Asthma    mild by PFT's 03/16/1998  . Cervical lymphadenopathy    biopsy negativeMarvetta Gibbons)  . Chronic hypertension   . Complete spontaneous abortion    x2  . GERD (gastroesophageal reflux disease)   . History of abnormal Pap smear    s/p cryo( Dr. Wiliam Ke)  . HSV (herpes simplex virus) anogenital infection   . Hypertension   . Kidney stone   . Normal vaginal delivery    x3  . Rheumatoid arthritis (HCC)   . Sciatica 01/12/06  . 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   . Vaginal Pap smear, abnormal     Past Surgical History:  Procedure Laterality Date  . biopsy of lymph node in R neck  2007.  [facility] LEEP      Social History   Socioeconomic History  . Marital status: Single    Spouse name: Not on file  . Number of children: Not on file  . Years of education: Not on file  . Highest education level: Not on file  Social Needs  . Financial resource strain: Not on file  . Food insecurity - worry: Not on file  . Food insecurity - inability: Not on file  . Transportation needs - medical: Not on file  . Transportation needs - non-medical: Not on file  Occupational History  . Not on file  Tobacco Use  . Smoking status:  Never Smoker  . Smokeless tobacco: Never Used  Substance and Sexual Activity  . Alcohol use: No    Alcohol/week: 0.0 oz    Comment: occasional beer and liquor on weekends  . Drug use: No  . Sexual activity: Yes    Birth control/protection: None  Other Topics Concern  . Not on file  Social History Narrative  . Not on file    Current Outpatient Medications on File Prior to Visit  Medication Sig Dispense Refill  . acetaminophen (TYLENOL) 500 MG tablet Take 1,000 mg by mouth every 6 (six) hours as needed for mild pain, moderate pain or headache.    . albuterol (PROVENTIL HFA;VENTOLIN HFA) 108 (90 Base) MCG/ACT inhaler Inhale 1-2 puffs into the lungs every 6 (six) hours as needed for wheezing or shortness of breath. 1 Inhaler 0  . amLODipine (NORVASC) 5 MG tablet take 1 tablet by mouth once daily 30 tablet 11  . calcium carbonate (TUMS - DOSED IN MG ELEMENTAL CALCIUM) 500 MG chewable tablet Chew 1 tablet by mouth daily.    . cetirizine (ZYRTEC) 10 MG tablet Take 1 tablet (10 mg total) by mouth daily. 30 tablet 6  . Cetirizine HCl (ZYRTEC ALLERGY PO)  Take by mouth.    . Chlorpheniramine-DM (CORICIDIN HBP COUGH/COLD PO) Take by mouth.    Clinical research associate Bandages & Supports (COMFORT FIT MATERNITY SUPP MED) MISC Wear daily when ambulating 1 each 0  . HUMIRA PEN 40 MG/0.8ML PNKT   0  . Melatonin 3 MG TABS Take 1.5 mg by mouth at bedtime.    . mesalamine (CANASA) 1000 MG suppository Place 1,000 mg rectally as needed.    . metoCLOPramide (REGLAN) 5 MG tablet Take 1 tablet (5 mg total) by mouth 4 (four) times daily -  before meals and at bedtime. (Patient not taking: Reported on 03/12/2017) 120 tablet 2  . pantoprazole (PROTONIX) 40 MG tablet Take 40 mg by mouth daily.  1  . Prenatal Vit w/Fe-Methylfol-FA (PNV PO) Take by mouth.    . valACYclovir (VALTREX) 500 MG tablet Take 1 tablet (500 mg total) by mouth daily. 30 tablet 1  . [DISCONTINUED] levonorgestrel-ethinyl estradiol (SEASONALE,INTROVALE,JOLESSA)  0.15-0.03 MG tablet Take 1 tablet by mouth daily. (Patient not taking: Reported on 07/27/2015) 1 Package 3  . [DISCONTINUED] loratadine (CLARITIN) 10 MG tablet Take 1 tablet (10 mg total) by mouth daily. (Patient not taking: Reported on 07/27/2015) 30 tablet 0   No current facility-administered medications on file prior to visit.     Allergies  Allergen Reactions  . Ciprofloxacin Swelling    Throat swells  . Clindamycin/Lincomycin Swelling  . Sulfamethoxazole-Trimethoprim Swelling    Swelling is of the lips. Swelling is of the lips.  . Amoxicillin Rash    Has patient had a PCN reaction causing immediate rash, facial/tongue/throat swelling, SOB or lightheadedness with hypotension: no Has patient had a PCN reaction causing severe rash involving mucus membranes or skin necrosis: pt had rash -- non severe  Has patient had a PCN reaction that required hospitalization: no Has patient had a PCN reaction occurring within the last 10 years: no If all of the above answers are "NO", then may proceed with Cephalosporin use.     Family History  Problem Relation Age of Onset  . Ulcerative colitis Mother   . Hypertension Mother   . Diabetes Father   . Heart attack Father   . Hypertension Father   . Diabetes Sister   . Thyroid cancer Sister   . Heart attack Maternal Grandmother   . Stroke Maternal Grandmother     BP 118/78 (BP Location: Left Arm, Patient Position: Sitting, Cuff Size: Normal)   Pulse 97   Wt 181 lb 6.4 oz (82.3 kg)   LMP 06/29/2016 (Approximate)   SpO2 97%   BMI 31.14 kg/m   Review of Systems Denies fever.     Objective:   Physical Exam VS: see vs page GEN: no distress HEAD: head: no deformity eyes: no periorbital swelling, no proptosis external nose and ears are normal mouth: no lesion seen NECK: a healed scar is present (LN resection in 2006).  I do not appreciate a nodule in the thyroid or elsewhere in the neck.      Assessment & Plan:  Hyperthyroidism, due  for recheck  Patient Instructions  blood tests are requested for you today.  We'll let you know about the results.  If ever you have fever while taking methimazole, stop it and call us, even if the reason is obvious, because of the risk of a rare side-effect.   Please come back for a follow-up appointment in 2 months.

## 2017-03-11 NOTE — Patient Instructions (Addendum)
blood tests are requested for you today.  We'll let you know about the results.   If ever you have fever while taking methimazole, stop it and call us, even if the reason is obvious, because of the risk of a rare side-effect.  Please come back for a follow-up appointment in 2 months.   

## 2017-03-12 ENCOUNTER — Other Ambulatory Visit: Payer: Self-pay

## 2017-03-12 ENCOUNTER — Ambulatory Visit (INDEPENDENT_AMBULATORY_CARE_PROVIDER_SITE_OTHER): Payer: Medicaid Other | Admitting: Advanced Practice Midwife

## 2017-03-12 DIAGNOSIS — O99283 Endocrine, nutritional and metabolic diseases complicating pregnancy, third trimester: Secondary | ICD-10-CM

## 2017-03-12 DIAGNOSIS — E0590 Thyrotoxicosis, unspecified without thyrotoxic crisis or storm: Secondary | ICD-10-CM

## 2017-03-12 NOTE — Progress Notes (Signed)
   PRENATAL VISIT NOTE  Subjective:  Sue [name] is a 42 y.o. [id number] at [redacted]w[redacted]d being seen today for ongoing prenatal care.  She is currently monitored for the following issues for this high-risk pregnancy and has Essential hypertension; HERNIATED DISC; Carpal tunnel syndrome; Sacroiliac dysfunction; Mid back pain; Seasonal allergies; Acute anterior uveitis; Healthcare maintenance; Fatty liver; Hiatal hernia; Overweight (BMI 25.0-29.9); Alopecia; Advanced maternal age in multigravida, unspecified trimester; Encounter for supervision of high risk multigravida of advanced maternal age, antepartum; Chronic hypertension during pregnancy, antepartum; History of ELISA positive for HSV; Hyperthyroidism affecting pregnancy; GERD (gastroesophageal reflux disease); and Rheumatoid arteritis on their problem list.  Patient reports occasional contractions and diarrhea x 1 and nausea. No fever, vomiting, HA, vision changes or epigastric pain..  Contractions: Irregular. Vag. Bleeding: None.  Movement: Present. Denies leaking of fluid.   The following portions of the patient's history were reviewed and updated as appropriate: allergies, current medications, past family history, past medical history, past social history, past surgical history and problem list. Problem list updated.  Growth [place] reviewed, Nml.  Objective:   Vitals:   03/12/17 1057  BP: 129/77  Pulse: (!) 105  Temp: 98.6 F (37 C)  Weight: 181 lb (82.1 kg)    Fetal Status: Fetal Heart Rate (bpm): NST Fundal Height: 37 cm Movement: Present      NST Reviewed. One variable. Monitoring continued for 20 more minutes w/out further variables and essentially a neg CST. Reviewed with D/r Alysia Penna. No further testing needed.   General:  Alert, oriented and cooperative. Patient is in no acute distress.  Skin: Skin is warm and dry. No rash noted.   Cardiovascular: Normal heart rate noted  Respiratory: Normal respiratory effort, no problems with respiration  noted  Abdomen: Soft, gravid, appropriate for gestational age.  Pain/Pressure: Present     Pelvic: Cervical exam declined        Extremities: Normal range of motion.  Edema: Trace  Mental Status:  Normal mood and affect. Normal behavior. Normal judgment and thought content.   Assessment and Plan:  Pregnancy: [id number] at [redacted]w[redacted]d  1. Hyperthyroidism affecting pregnancy in third trimester - Methimazole stopped by Endo. - Has F/U appt PP  2. CHTN w/ out evidence of Pre-E.   Preterm labor symptoms and general obstetric precautions including but not limited to vaginal bleeding, contractions, leaking of fluid and fetal movement were reviewed in detail with the patient. Please refer to After Visit Summary for other counseling recommendations.  Return in about 1 week (around 03/19/2017) for ROB. BPP 1/9 IOL at 39 weeks  Dorathy Kinsman, [place]

## 2017-03-12 NOTE — Progress Notes (Signed)
ROB/NST.  Complains of having Diarrhea and Nausea this morning 1 Episode.

## 2017-03-16 ENCOUNTER — Ambulatory Visit: Payer: Self-pay

## 2017-03-16 ENCOUNTER — Ambulatory Visit (INDEPENDENT_AMBULATORY_CARE_PROVIDER_SITE_OTHER): Payer: Medicaid Other | Admitting: *Deleted

## 2017-03-16 VITALS — BP 126/80 | HR 94

## 2017-03-16 DIAGNOSIS — O09523 Supervision of elderly multigravida, third trimester: Secondary | ICD-10-CM

## 2017-03-16 DIAGNOSIS — O10919 Unspecified pre-existing hypertension complicating pregnancy, unspecified trimester: Secondary | ICD-10-CM

## 2017-03-16 DIAGNOSIS — O10913 Unspecified pre-existing hypertension complicating pregnancy, third trimester: Secondary | ICD-10-CM | POA: Diagnosis present

## 2017-03-16 NOTE — Progress Notes (Signed)
Pt informed that the ultrasound is considered a limited OB ultrasound and is not intended to be a complete ultrasound exam.  Patient also informed that the ultrasound is not being completed with the intent of assessing for fetal or placental anomalies or any pelvic abnormalities.  Explained that the purpose of today's ultrasound is to assess for presentation, BPP and amniotic fluid volume.  Patient acknowledges the purpose of the exam and the limitations of the study.    Pt has scheduled appt on 1/11 for prenatal visit w/Dr. Clearance Coots.

## 2017-03-17 ENCOUNTER — Other Ambulatory Visit: Payer: Medicaid Other

## 2017-03-19 ENCOUNTER — Observation Stay (HOSPITAL_COMMUNITY)
Admission: AD | Admit: 2017-03-19 | Discharge: 2017-03-20 | Disposition: A | Discharge status: Home / Self Care | Payer: Medicaid Other | Source: Ambulatory Visit | Attending: Obstetrics and Gynecology | Admitting: Obstetrics and Gynecology

## 2017-03-19 ENCOUNTER — Other Ambulatory Visit: Payer: Self-pay

## 2017-03-19 ENCOUNTER — Other Ambulatory Visit (HOSPITAL_COMMUNITY): Payer: Medicaid Other

## 2017-03-19 ENCOUNTER — Encounter: Payer: Self-pay | Admitting: Obstetrics

## 2017-03-19 ENCOUNTER — Ambulatory Visit (INDEPENDENT_AMBULATORY_CARE_PROVIDER_SITE_OTHER): Payer: Medicaid Other | Admitting: Obstetrics

## 2017-03-19 ENCOUNTER — Encounter (HOSPITAL_COMMUNITY): Payer: Self-pay | Admitting: *Deleted

## 2017-03-19 VITALS — BP 136/80 | HR 97 | Wt 184.5 lb

## 2017-03-19 DIAGNOSIS — M069 Rheumatoid arthritis, unspecified: Secondary | ICD-10-CM | POA: Insufficient documentation

## 2017-03-19 DIAGNOSIS — Z881 Allergy status to other antibiotic agents status: Secondary | ICD-10-CM | POA: Insufficient documentation

## 2017-03-19 DIAGNOSIS — O10919 Unspecified pre-existing hypertension complicating pregnancy, unspecified trimester: Secondary | ICD-10-CM

## 2017-03-19 DIAGNOSIS — O99513 Diseases of the respiratory system complicating pregnancy, third trimester: Secondary | ICD-10-CM | POA: Insufficient documentation

## 2017-03-19 DIAGNOSIS — O24415 Gestational diabetes mellitus in pregnancy, controlled by oral hypoglycemic drugs: Secondary | ICD-10-CM | POA: Diagnosis not present

## 2017-03-19 DIAGNOSIS — Z8249 Family history of ischemic heart disease and other diseases of the circulatory system: Secondary | ICD-10-CM | POA: Insufficient documentation

## 2017-03-19 DIAGNOSIS — O09529 Supervision of elderly multigravida, unspecified trimester: Secondary | ICD-10-CM

## 2017-03-19 DIAGNOSIS — O10913 Unspecified pre-existing hypertension complicating pregnancy, third trimester: Secondary | ICD-10-CM

## 2017-03-19 DIAGNOSIS — O09523 Supervision of elderly multigravida, third trimester: Secondary | ICD-10-CM

## 2017-03-19 DIAGNOSIS — O9989 Other specified diseases and conditions complicating pregnancy, childbirth and the puerperium: Secondary | ICD-10-CM | POA: Diagnosis not present

## 2017-03-19 DIAGNOSIS — Z882 Allergy status to sulfonamides status: Secondary | ICD-10-CM | POA: Diagnosis not present

## 2017-03-19 DIAGNOSIS — Z880 Allergy status to penicillin: Secondary | ICD-10-CM | POA: Insufficient documentation

## 2017-03-19 DIAGNOSIS — O98313 Other infections with a predominantly sexual mode of transmission complicating pregnancy, third trimester: Secondary | ICD-10-CM | POA: Insufficient documentation

## 2017-03-19 DIAGNOSIS — Z87442 Personal history of urinary calculi: Secondary | ICD-10-CM | POA: Insufficient documentation

## 2017-03-19 DIAGNOSIS — K219 Gastro-esophageal reflux disease without esophagitis: Secondary | ICD-10-CM | POA: Insufficient documentation

## 2017-03-19 DIAGNOSIS — Z3A37 37 weeks gestation of pregnancy: Secondary | ICD-10-CM | POA: Diagnosis not present

## 2017-03-19 DIAGNOSIS — O368390 Maternal care for abnormalities of the fetal heart rate or rhythm, unspecified trimester, not applicable or unspecified: Secondary | ICD-10-CM

## 2017-03-19 DIAGNOSIS — J45909 Unspecified asthma, uncomplicated: Secondary | ICD-10-CM | POA: Insufficient documentation

## 2017-03-19 DIAGNOSIS — O99613 Diseases of the digestive system complicating pregnancy, third trimester: Secondary | ICD-10-CM | POA: Insufficient documentation

## 2017-03-19 DIAGNOSIS — Z79899 Other long term (current) drug therapy: Secondary | ICD-10-CM | POA: Diagnosis not present

## 2017-03-19 LAB — CBC
HCT: 33.6 % — ABNORMAL LOW (ref 36.0–46.0)
Hemoglobin: 11.7 g/dL — ABNORMAL LOW (ref 12.0–15.0)
MCH: 30.8 pg (ref 26.0–34.0)
MCHC: 34.8 g/dL (ref 30.0–36.0)
MCV: 88.4 fL (ref 78.0–100.0)
Platelets: 264 10*3/uL (ref 150–400)
RBC: 3.8 MIL/uL — ABNORMAL LOW (ref 3.87–5.11)
RDW: 15.7 % — ABNORMAL HIGH (ref 11.5–15.5)
WBC: 7.1 10*3/uL (ref 4.0–10.5)

## 2017-03-19 LAB — TYPE AND SCREEN
ABO/RH(D): O POS
Antibody Screen: NEGATIVE

## 2017-03-19 LAB — ABO/RH: ABO/RH(D): O POS

## 2017-03-19 MED ORDER — ALBUTEROL SULFATE (2.5 MG/3ML) 0.083% IN NEBU: 2.5000 mg | INHALATION_SOLUTION | RESPIRATORY_TRACT | Status: DC | PRN

## 2017-03-19 MED ORDER — PANTOPRAZOLE SODIUM 40 MG PO TBEC: 40.0000 mg | DELAYED_RELEASE_TABLET | Freq: Every day | ORAL | Status: DC

## 2017-03-19 MED ORDER — AMLODIPINE BESYLATE 5 MG PO TABS: 5.0000 mg | ORAL_TABLET | Freq: Every day | ORAL | Status: DC

## 2017-03-19 MED ORDER — SODIUM CHLORIDE 0.9% FLUSH
3.0000 mL | Freq: Two times a day (BID) | INTRAVENOUS | Status: DC
  Administered 2017-03-19: 3 mL via INTRAVENOUS

## 2017-03-19 MED ORDER — SODIUM CHLORIDE 0.9 % IV SOLN: 250.0000 mL | INTRAVENOUS | Status: DC | PRN

## 2017-03-19 MED ORDER — VALACYCLOVIR HCL 500 MG PO TABS
500.0000 mg | ORAL_TABLET | Freq: Every day | ORAL | Status: DC
  Filled 2017-03-19: qty 1

## 2017-03-19 MED ORDER — PRENATAL MULTIVITAMIN CH: 1.0000 | ORAL_TABLET | Freq: Every day | ORAL | Status: DC

## 2017-03-19 MED ORDER — SODIUM CHLORIDE 0.9% FLUSH: 3.0000 mL | INTRAVENOUS | Status: DC | PRN

## 2017-03-19 MED ORDER — ACETAMINOPHEN 325 MG PO TABS: 650.0000 mg | ORAL_TABLET | ORAL | Status: DC | PRN

## 2017-03-19 MED ORDER — LORATADINE 10 MG PO TABS
10.0000 mg | ORAL_TABLET | Freq: Every day | ORAL | Status: DC
  Filled 2017-03-19 (×2): qty 1

## 2017-03-19 MED ORDER — AMLODIPINE BESYLATE 5 MG PO TABS
5.0000 mg | ORAL_TABLET | Freq: Every day | ORAL | Status: DC
  Administered 2017-03-19: 5 mg via ORAL
  Filled 2017-03-19: qty 1

## 2017-03-19 MED ORDER — DOCUSATE SODIUM 100 MG PO CAPS
100.0000 mg | ORAL_CAPSULE | Freq: Every day | ORAL | Status: DC
  Filled 2017-03-19 (×2): qty 1

## 2017-03-19 MED ORDER — LORATADINE 10 MG PO TABS
10.0000 mg | ORAL_TABLET | Freq: Every day | ORAL | Status: DC
  Administered 2017-03-19: 10 mg via ORAL
  Filled 2017-03-19: qty 1

## 2017-03-19 MED ORDER — CALCIUM CARBONATE ANTACID 500 MG PO CHEW: 2.0000 | CHEWABLE_TABLET | ORAL | Status: DC | PRN

## 2017-03-19 MED ORDER — VALACYCLOVIR HCL 500 MG PO TABS
500.0000 mg | ORAL_TABLET | Freq: Every day | ORAL | Status: DC
  Administered 2017-03-19: 500 mg via ORAL
  Filled 2017-03-19: qty 1

## 2017-03-19 NOTE — H&P (Signed)
History     CSN: [id number]  Arrival date and time: 03/19/17 1046   First Provider Initiated Contact with Patient 03/19/17 1128      Chief Complaint  Patient presents with  . Non-stress Test   HPI Sue [name] is a 42 y.o. [id number] at [redacted]w[redacted]d who presents from the office for prolonged monitoring. She was in the office for NST/BPP due to chronic hypertension. She scored at 10/10 but there were variables on the NST so she was sent for more monitoring. She reports intermittent contractions. Denies vaginal bleeding, leaking or discharge. Reports good fetal movement. She denies any headache, visual changes or epigastric pain.  OB History    Gravida Para Term Preterm AB Living   7 3 2 1 3 3    SAB TAB Ectopic Multiple Live Births   3       3      Past Medical History:  Diagnosis Date  . Asthma    mild by PFT's 03/16/1998  . Cervical lymphadenopathy    biopsy negative3/10/1998)  . Chronic hypertension   . Complete spontaneous abortion    x2  . GERD (gastroesophageal reflux disease)   . History of abnormal Pap smear    s/p cryo( Dr. [name]  . HSV (herpes simplex virus) anogenital infection   . Hypertension   . Kidney stone   . Normal vaginal delivery    x3  . Rheumatoid arthritis (HCC)   . Sciatica 01/12/06  . 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   . Vaginal Pap smear, abnormal     Past Surgical History:  Procedure Laterality Date  . biopsy of lymph node in R neck  2007.  2008 LEEP      Family History  Problem Relation Age of Onset  . Ulcerative colitis Mother   . Hypertension Mother   . Diabetes Father   . Heart attack Father   . Hypertension Father   . Diabetes Sister   . Thyroid cancer Sister   . Heart attack Maternal Grandmother   . Stroke Maternal Grandmother     Social History   Tobacco Use  . Smoking  status: Never Smoker  . Smokeless tobacco: Never Used  Substance Use Topics  . Alcohol use: No    Alcohol/week: 0.0 oz    Comment: occasional beer and liquor on weekends  . Drug use: No    Allergies:  Allergies  Allergen Reactions  . Ciprofloxacin Anaphylaxis and Swelling    Throat swells  . Clindamycin/Lincomycin Anaphylaxis and Swelling  . Sulfamethoxazole-Trimethoprim Swelling    Swelling is of the lips. Swelling is of the lips.  . Amoxicillin Rash    Has patient had a PCN reaction causing immediate rash, facial/tongue/throat swelling, SOB or lightheadedness with hypotension: no Has patient had a PCN reaction causing severe rash involving mucus membranes or skin necrosis: pt had rash -- non severe  Has patient had a PCN reaction that required hospitalization: no Has patient had a PCN reaction occurring within the last 10 years: no If all of the above answers are "NO", then may proceed with Cephalosporin use.     Medications Prior to Admission  Medication Sig Dispense Refill Last Dose  . acetaminophen (TYLENOL) 500 MG tablet Take 1,000 mg by mouth every 6 (six) hours as needed for mild pain, moderate pain or headache.  Past Month at Unknown time  . albuterol (PROVENTIL HFA;VENTOLIN HFA) 108 (90 Base) MCG/ACT inhaler Inhale 1-2 puffs into the lungs every 6 (six) hours as needed for wheezing or shortness of breath. 1 Inhaler 0 Past Week at Unknown time  . amLODipine (NORVASC) 5 MG tablet take 1 tablet by mouth once daily 30 tablet 11 03/18/2017 at Unknown time  . calcium carbonate (TUMS - DOSED IN MG ELEMENTAL CALCIUM) 500 MG chewable tablet Chew 1 tablet by mouth daily as needed for heartburn.    03/18/2017 at Unknown time  . cetirizine (ZYRTEC) 10 MG tablet Take 10 mg by mouth daily.   03/18/2017 at Unknown time  . Chlorpheniramine-DM (CORICIDIN HBP COUGH/COLD PO) Take 1 tablet by mouth daily as needed (for cough).    Past Week at Unknown time  . mesalamine (CANASA) 1000 MG  suppository Place 1,000 mg rectally as needed (Ulcerative Proctitis).    Past Week at Unknown time  . pantoprazole (PROTONIX) 40 MG tablet Take 40 mg by mouth daily.  1 03/19/2017 at Unknown time  . Prenatal Vit w/Fe-Methylfol-FA (PNV PO) Take 1 tablet by mouth daily.    03/19/2017 at Unknown time  . valACYclovir (VALTREX) 500 MG tablet Take 1 tablet (500 mg total) by mouth daily. 30 tablet 1 03/18/2017 at Unknown time  . Elastic Bandages & Supports (COMFORT FIT MATERNITY SUPP MED) MISC Wear daily when ambulating 1 each 0 Taking  . metoCLOPramide (REGLAN) 5 MG tablet Take 1 tablet (5 mg total) by mouth 4 (four) times daily -  before meals and at bedtime. (Patient not taking: Reported on 03/12/2017) 120 tablet 2 Not Taking at Unknown time    Review of Systems  Constitutional: Negative.  Negative for fatigue and fever.  HENT: Negative.   Respiratory: Negative.  Negative for shortness of breath.   Cardiovascular: Negative.  Negative for chest pain.  Gastrointestinal: Negative.  Negative for abdominal pain, constipation, diarrhea, nausea and vomiting.  Genitourinary: Negative.  Negative for dysuria.  Neurological: Negative.  Negative for dizziness and headaches.   Physical Exam   Blood pressure 125/83, pulse 98, temperature 98.8 F (37.1 C), temperature source Oral, resp. rate 16, height 5\' 4"  (1.626 m), weight 184 lb (83.5 kg), last menstrual period 06/29/2016, SpO2 100 %.  Physical Exam  Nursing note and vitals reviewed. Constitutional: She is oriented to person, place, and time. She appears well-developed and well-nourished. No distress.  HENT:  Head: Normocephalic.  Eyes: Pupils are equal, round, and reactive to light.  Cardiovascular: Normal rate, regular rhythm and normal heart sounds.  Respiratory: Effort normal and breath sounds normal. No respiratory distress.  GI: Soft. Bowel sounds are normal. She exhibits no distension. There is no tenderness.  Neurological: She is alert and  oriented to person, place, and time.  Skin: Skin is warm and dry.  Psychiatric: She has a normal mood and affect. Her behavior is normal. Judgment and thought content normal.   Fetal Tracing:  Baseline: 130 Variability: moderate Accels: 15x15 Decels: variable, prolonged  Toco: none  MAU Course  Procedures  MDM NST Reviewed tracing with Dr. 07/01/2016- will admit to Antenatal for observation over night and repeat BPP in AM Assessment and Plan   1. Fetal heart rate decelerations affecting management of mother   2. Chronic hypertension during pregnancy, antepartum   3. Gestational diabetes mellitus (GDM) in third trimester controlled on oral hypoglycemic drug    -Admit to antenatal for observation -Repeat BPP in AM  Earlene Plater  CNM 03/19/2017, 11:33 AM

## 2017-03-19 NOTE — Progress Notes (Signed)
ROB NST 

## 2017-03-19 NOTE — MAU Note (Signed)
Pt sent from office due to decelerations on EFM. Was there for routine testing. Pt also reports contractions

## 2017-03-19 NOTE — MAU Provider Note (Deleted)
History     CSN: [id number]  Arrival date and time: 03/19/17 1046   First Provider Initiated Contact with Patient 03/19/17 1128      Chief Complaint  Patient presents with  . Non-stress Test   HPI Sue [name] is a 42 y.o. [id number] at [redacted]w[redacted]d who presents from the office for prolonged monitoring. She was in the office for NST/BPP due to chronic hypertension. She scored at 10/10 but there were variables on the NST so she was sent for more monitoring. She reports intermittent contractions. Denies vaginal bleeding, leaking or discharge. Reports good fetal movement. She denies any headache, visual changes or epigastric pain.  OB History    Gravida Para Term Preterm AB Living   7 3 2 1 3 3    SAB TAB Ectopic Multiple Live Births   3       3      Past Medical History:  Diagnosis Date  . Asthma    mild by PFT's 03/16/1998  . Cervical lymphadenopathy    biopsy negative3/10/1998)  . Chronic hypertension   . Complete spontaneous abortion    x2  . GERD (gastroesophageal reflux disease)   . History of abnormal Pap smear    s/p cryo( Dr. [name]  . HSV (herpes simplex virus) anogenital infection   . Hypertension   . Kidney stone   . Normal vaginal delivery    x3  . Rheumatoid arthritis (HCC)   . Sciatica 01/12/06  . 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   . Vaginal Pap smear, abnormal     Past Surgical History:  Procedure Laterality Date  . biopsy of lymph node in R neck  2007.  2008 LEEP      Family History  Problem Relation Age of Onset  . Ulcerative colitis Mother   . Hypertension Mother   . Diabetes Father   . Heart attack Father   . Hypertension Father   . Diabetes Sister   . Thyroid cancer Sister   . Heart attack Maternal Grandmother   . Stroke Maternal Grandmother     Social History   Tobacco Use  . Smoking  status: Never Smoker  . Smokeless tobacco: Never Used  Substance Use Topics  . Alcohol use: No    Alcohol/week: 0.0 oz    Comment: occasional beer and liquor on weekends  . Drug use: No    Allergies:  Allergies  Allergen Reactions  . Ciprofloxacin Anaphylaxis and Swelling    Throat swells  . Clindamycin/Lincomycin Anaphylaxis and Swelling  . Sulfamethoxazole-Trimethoprim Swelling    Swelling is of the lips. Swelling is of the lips.  . Amoxicillin Rash    Has patient had a PCN reaction causing immediate rash, facial/tongue/throat swelling, SOB or lightheadedness with hypotension: no Has patient had a PCN reaction causing severe rash involving mucus membranes or skin necrosis: pt had rash -- non severe  Has patient had a PCN reaction that required hospitalization: no Has patient had a PCN reaction occurring within the last 10 years: no If all of the above answers are "NO", then may proceed with Cephalosporin use.     Medications Prior to Admission  Medication Sig Dispense Refill Last Dose  . acetaminophen (TYLENOL) 500 MG tablet Take 1,000 mg by mouth every 6 (six) hours as needed for mild pain, moderate pain or headache.   Past  Month at Unknown time  . albuterol (PROVENTIL HFA;VENTOLIN HFA) 108 (90 Base) MCG/ACT inhaler Inhale 1-2 puffs into the lungs every 6 (six) hours as needed for wheezing or shortness of breath. 1 Inhaler 0 Past Week at Unknown time  . amLODipine (NORVASC) 5 MG tablet take 1 tablet by mouth once daily 30 tablet 11 03/18/2017 at Unknown time  . calcium carbonate (TUMS - DOSED IN MG ELEMENTAL CALCIUM) 500 MG chewable tablet Chew 1 tablet by mouth daily as needed for heartburn.    03/18/2017 at Unknown time  . cetirizine (ZYRTEC) 10 MG tablet Take 10 mg by mouth daily.   03/18/2017 at Unknown time  . Chlorpheniramine-DM (CORICIDIN HBP COUGH/COLD PO) Take 1 tablet by mouth daily as needed (for cough).    Past Week at Unknown time  . mesalamine (CANASA) 1000 MG  suppository Place 1,000 mg rectally as needed (Ulcerative Proctitis).    Past Week at Unknown time  . pantoprazole (PROTONIX) 40 MG tablet Take 40 mg by mouth daily.  1 03/19/2017 at Unknown time  . Prenatal Vit w/Fe-Methylfol-FA (PNV PO) Take 1 tablet by mouth daily.    03/19/2017 at Unknown time  . valACYclovir (VALTREX) 500 MG tablet Take 1 tablet (500 mg total) by mouth daily. 30 tablet 1 03/18/2017 at Unknown time  . Elastic Bandages & Supports (COMFORT FIT MATERNITY SUPP MED) MISC Wear daily when ambulating 1 each 0 Taking  . metoCLOPramide (REGLAN) 5 MG tablet Take 1 tablet (5 mg total) by mouth 4 (four) times daily -  before meals and at bedtime. (Patient not taking: Reported on 03/12/2017) 120 tablet 2 Not Taking at Unknown time    Review of Systems  Constitutional: Negative.  Negative for fatigue and fever.  HENT: Negative.   Respiratory: Negative.  Negative for shortness of breath.   Cardiovascular: Negative.  Negative for chest pain.  Gastrointestinal: Negative.  Negative for abdominal pain, constipation, diarrhea, nausea and vomiting.  Genitourinary: Negative.  Negative for dysuria.  Neurological: Negative.  Negative for dizziness and headaches.   Physical Exam   Blood pressure 125/83, pulse 98, temperature 98.8 F (37.1 C), temperature source Oral, resp. rate 16, height 5\' 4"  (1.626 m), weight 184 lb (83.5 kg), last menstrual period 06/29/2016, SpO2 100 %.  Physical Exam  Nursing note and vitals reviewed. Constitutional: She is oriented to person, place, and time. She appears well-developed and well-nourished. No distress.  HENT:  Head: Normocephalic.  Eyes: Pupils are equal, round, and reactive to light.  Cardiovascular: Normal rate, regular rhythm and normal heart sounds.  Respiratory: Effort normal and breath sounds normal. No respiratory distress.  GI: Soft. Bowel sounds are normal. She exhibits no distension. There is no tenderness.  Neurological: She is alert and  oriented to person, place, and time.  Skin: Skin is warm and dry.  Psychiatric: She has a normal mood and affect. Her behavior is normal. Judgment and thought content normal.   Fetal Tracing:  Baseline: 130 Variability: moderate Accels: 15x15 Decels: none  Toco: none  MAU Course  Procedures  MDM NST Reviewed tracing with Dr. 07/01/2016- will admit to Antenatal for observation over night and repeat BPP in AM Assessment and Plan   1. Fetal heart rate decelerations affecting management of mother   2. Chronic hypertension during pregnancy, antepartum   3. Gestational diabetes mellitus (GDM) in third trimester controlled on oral hypoglycemic drug    -Admit to antenatal for observation -Repeat BPP in AM  Earlene Plater CNM 03/19/2017,  11:33 AM

## 2017-03-19 NOTE — Progress Notes (Signed)
Subjective:  Sue [name] is a 42 y.o. O5D6644 at [redacted]w[redacted]d being seen today for ongoing prenatal care.  She is currently monitored for the following issues for this high-risk pregnancy and has Essential hypertension; HERNIATED DISC; Carpal tunnel syndrome; Sacroiliac dysfunction; Mid back pain; Seasonal allergies; Acute anterior uveitis; Healthcare maintenance; Fatty liver; Hiatal hernia; Overweight (BMI 25.0-29.9); Alopecia; Advanced maternal age in multigravida, unspecified trimester; Encounter for supervision of high risk multigravida of advanced maternal age, antepartum; Chronic hypertension during pregnancy, antepartum; History of ELISA positive for HSV; Hyperthyroidism affecting pregnancy; GERD (gastroesophageal reflux disease); and Rheumatoid arteritis on their problem list.  Patient reports no complaints.  Contractions: Irregular. Vag. Bleeding: None.  Movement: Present. Denies leaking of fluid.   The following portions of the patient's history were reviewed and updated as appropriate: allergies, current medications, past family history, past medical history, past social history, past surgical history and problem list. Problem list updated.  Objective:   Vitals:   03/19/17 0852  BP: 136/80  Pulse: 97  Weight: 184 lb 8 oz (83.7 kg)    Fetal Status:     Movement: Present     General:  Alert, oriented and cooperative. Patient is in no acute distress.  Skin: Skin is warm and dry. No rash noted.   Cardiovascular: Normal heart rate noted  Respiratory: Normal respiratory effort, no problems with respiration noted  Abdomen: Soft, gravid, appropriate for gestational age. Pain/Pressure: Present     Pelvic:  Cervical exam deferred        Extremities: Normal range of motion.  Edema: Trace  Mental Status: Normal mood and affect. Normal behavior. Normal judgment and thought content.   Urinalysis:       Fetal / Maternal Assessment: US FETAL BPP W/NONSTRESS (Accession [id number]) (Order [id number])   Imaging  Date: 03/16/2017 Department: [facility] Released By: Day, Drucilla Schmidt, RN Authorizing: Tereso Newcomer, MD  Exam Information   Status Exam Begun  Exam Ended   Final [99] 03/16/2017 11:09 AM 03/16/2017 11:09 AM  PACS Images   Show images for US FETAL BPP W/NONSTRESS  Study Result   ----------------------------------------------------------------------  OBSTETRICS REPORT                      (Signed Final 03/17/2017 11:54 am) ---------------------------------------------------------------------- Patient Info  ID #:       [id number]                          D.O.B.:  1975-05-08 (41 yrs)  Name:       Sue [name]                      Visit Date: 03/16/2017 11:54 am ---------------------------------------------------------------------- Performed By  Performed By:     Sedalia Muta [employer]          Ref. Address:     [address]  [place], [place]                                                             [address]  Attending:        Catalina Antigua MD      Location:         [facility]                                                             [facility]  Referred By:      Tereso Newcomer MD ---------------------------------------------------------------------- Orders   #  Description                                 Code   1  US FETAL BPP W/NONSTRESS                    [phone]  ----------------------------------------------------------------------   #  Ordered By               Order #        Accession #    Episode #   1  Jaynie Collins           [id number]      [id number]     [id number]  ---------------------------------------------------------------------- Service(s) Provided   US Fetal BPP W NST                                   [id number]   ---------------------------------------------------------------------- Indications   [redacted] weeks gestation of pregnancy                Z3A.37   Unspecified pre-existing hypertension          O10.913   complicating pregnancy, third trimester   Advanced maternal age multigravida 75+,        O18.523   third trimester  ---------------------------------------------------------------------- OB History  Blood Type:            Height:  5'4"   Weight (lb):  175       BMI:  30.04  Gravidity:    7         Term:   2        Prem:   1        SAB:   3  Living:       3 ---------------------------------------------------------------------- Fetal Evaluation  Num Of Fetuses:     1  Preg. Location:     Intrauterine  Cardiac Activity:  Observed  Presentation:       Cephalic  Amniotic Fluid  AFI FV:      Subjectively within normal limits  AFI Sum(cm)     %Tile       Largest Pocket(cm)  11.85           38          3.81  RUQ(cm)       RLQ(cm)       LUQ(cm)        LLQ(cm)  3.81          3.65          3.18           1.21 ---------------------------------------------------------------------- Biophysical Evaluation  Amniotic F.V:   Pocket => 2 cm two         F. Tone:        Observed                  planes  F. Movement:    Observed                   N.S.T:          Reactive  F. Breathing:   Observed                   Score:          10/10 ---------------------------------------------------------------------- Gestational Age  LMP:           37w 1d        Date:  06/29/16                 EDD:   04/05/17  Best:          37w 1d     Det. By:  LMP  (06/29/16)          EDD:   04/05/17 ---------------------------------------------------------------------- Impression  Viable fetus in cephalic presentation  BPP 10/10 ---------------------------------------------------------------------- Recommendations  Continue antenatal testing.  Recommend delivery by 40 weeks if maternal and fetal status  remain  reassuring. ----------------------------------------------------------------------                 Catalina Antigua, MD Electronically Signed Final Report   03/17/2017 11:54 am ----------------------------------------------------------------------     Assessment and Plan:  Pregnancy: H5K5625 at [redacted]w[redacted]d  1. Encounter for supervision of high risk multigravida of advanced maternal age, antepartum - NST:   Reactive with good variability and 15x15 accels.  Baseline FHR 150's with UC's q 8 minutes and variable decels  2. Chronic hypertension during pregnancy, antepartum - continue twice a week BPP / NST - deliver by [redacted] weeks gestation if maternal and fetal status remain reassuring  Term labor symptoms and general obstetric precautions including but not limited to vaginal bleeding, contractions, leaking of fluid and fetal movement were reviewed in detail with the patient. Please refer to After Visit Summary for other counseling recommendations.  Return in about 1 week (around 03/26/2017) for ROB.  NST.Marland Kitchen   Brock Bad, MD

## 2017-03-20 ENCOUNTER — Observation Stay (HOSPITAL_COMMUNITY): Payer: Medicaid Other

## 2017-03-20 DIAGNOSIS — Z3A37 37 weeks gestation of pregnancy: Secondary | ICD-10-CM | POA: Diagnosis not present

## 2017-03-20 DIAGNOSIS — O10913 Unspecified pre-existing hypertension complicating pregnancy, third trimester: Secondary | ICD-10-CM | POA: Diagnosis not present

## 2017-03-20 DIAGNOSIS — O24415 Gestational diabetes mellitus in pregnancy, controlled by oral hypoglycemic drugs: Secondary | ICD-10-CM | POA: Diagnosis not present

## 2017-03-20 NOTE — Discharge Instructions (Signed)

## 2017-03-20 NOTE — Discharge Summary (Signed)
Physician Discharge Summary  Patient ID: Sue [name] MRN: [id number] DOB/AGE: 42-21-1977 42 y.o.  Admit date: 03/19/2017 Discharge date: 03/20/2017  Admission Diagnoses:[redacted]w[redacted]d Active Problems:   Non-reassuring electronic fetal monitoring tracing   Discharge Diagnoses:  Reassuring fetal monitoring  Discharged Condition: good  Hospital Course: Sue [name] is a 42 y.o. [id number] at [redacted]w[redacted]d who presents from the office for prolonged monitoring. She was in the office for NST/BPP due to chronic hypertension. She scored at 10/10 but there were variables on the NST so she was sent for more monitoring. She reports intermittent contractions. Denies vaginal bleeding, leaking or discharge. Reports good fetal movement. She denies any headache, visual changes or epigastric pain.          OB History    Gravida Para Term Preterm AB Living   7 3 2 1 3 3    SAB TAB Ectopic Multiple Live Births   3       3          Past Medical History:  Diagnosis Date  . Asthma    mild by PFT's 03/16/1998  . Cervical lymphadenopathy    biopsy negative3/10/1998)  . Chronic hypertension   . Complete spontaneous abortion    x2  . GERD (gastroesophageal reflux disease)   . History of abnormal Pap smear    s/p cryo( Dr. [name]  . HSV (herpes simplex virus) anogenital infection   . Hypertension   . Kidney stone   . Normal vaginal delivery    x3  . Rheumatoid arthritis (HCC)   . Sciatica 01/12/06  . 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   . Vaginal Pap smear, abnormal          Past Surgical History:  Procedure Laterality Date  . biopsy of lymph node in R neck  2007.  2008 LEEP           Family History  Problem Relation Age of Onset  . Ulcerative colitis Mother   . Hypertension Mother   . Diabetes Father   . Heart attack  Father   . Hypertension Father   . Diabetes Sister   . Thyroid cancer Sister   . Heart attack Maternal Grandmother   . Stroke Maternal Grandmother     Social History        Tobacco Use  . Smoking status: Never Smoker  . Smokeless tobacco: Never Used  Substance Use Topics  . Alcohol use: No    Alcohol/week: 0.0 oz    Comment: occasional beer and liquor on weekends  . Drug use: No    Allergies:       Allergies  Allergen Reactions  . Ciprofloxacin Anaphylaxis and Swelling    Throat swells  . Clindamycin/Lincomycin Anaphylaxis and Swelling  . Sulfamethoxazole-Trimethoprim Swelling    Swelling is of the lips. Swelling is of the lips.  . Amoxicillin Rash    Has patient had a PCN reaction causing immediate rash, facial/tongue/throat swelling, SOB or lightheadedness with hypotension: no Has patient had a PCN reaction causing severe rash involving mucus membranes or skin necrosis: pt had rash -- non severe  Has patient had a PCN reaction that required hospitalization: no Has patient had a PCN reaction occurring within the last 10 years: no If all of the above answers are "NO", then may proceed with Cephalosporin use.  Medications Prior to Admission  Medication Sig Dispense Refill Last Dose  . acetaminophen (TYLENOL) 500 MG tablet Take 1,000 mg by mouth every 6 (six) hours as needed for mild pain, moderate pain or headache.   Past Month at Unknown time  . albuterol (PROVENTIL HFA;VENTOLIN HFA) 108 (90 Base) MCG/ACT inhaler Inhale 1-2 puffs into the lungs every 6 (six) hours as needed for wheezing or shortness of breath. 1 Inhaler 0 Past Week at Unknown time  . amLODipine (NORVASC) 5 MG tablet take 1 tablet by mouth once daily 30 tablet 11 03/18/2017 at Unknown time  . calcium carbonate (TUMS - DOSED IN MG ELEMENTAL CALCIUM) 500 MG chewable tablet Chew 1 tablet by mouth daily as needed for heartburn.    03/18/2017 at Unknown time  . cetirizine  (ZYRTEC) 10 MG tablet Take 10 mg by mouth daily.   03/18/2017 at Unknown time  . Chlorpheniramine-DM (CORICIDIN HBP COUGH/COLD PO) Take 1 tablet by mouth daily as needed (for cough).    Past Week at Unknown time  . mesalamine (CANASA) 1000 MG suppository Place 1,000 mg rectally as needed (Ulcerative Proctitis).    Past Week at Unknown time  . pantoprazole (PROTONIX) 40 MG tablet Take 40 mg by mouth daily.  1 03/19/2017 at Unknown time  . Prenatal Vit w/Fe-Methylfol-FA (PNV PO) Take 1 tablet by mouth daily.    03/19/2017 at Unknown time  . valACYclovir (VALTREX) 500 MG tablet Take 1 tablet (500 mg total) by mouth daily. 30 tablet 1 03/18/2017 at Unknown time  . Elastic Bandages & Supports (COMFORT FIT MATERNITY SUPP MED) MISC Wear daily when ambulating 1 each 0 Taking  . metoCLOPramide (REGLAN) 5 MG tablet Take 1 tablet (5 mg total) by mouth 4 (four) times daily -  before meals and at bedtime. (Patient not taking: Reported on 03/12/2017) 120 tablet 2 Not Taking at Unknown time    Review of Systems  Constitutional: Negative.  Negative for fatigue and fever.  HENT: Negative.   Respiratory: Negative.  Negative for shortness of breath.   Cardiovascular: Negative.  Negative for chest pain.  Gastrointestinal: Negative.  Negative for abdominal pain, constipation, diarrhea, nausea and vomiting.  Genitourinary: Negative.  Negative for dysuria.  Neurological: Negative.  Negative for dizziness and headaches.   Physical Exam   Blood pressure 125/83, pulse 98, temperature 98.8 F (37.1 C), temperature source Oral, resp. rate 16, height 5\' 4"  (1.626 m), weight 184 lb (83.5 kg), last menstrual period 06/29/2016, SpO2 100 %.  Physical Exam  Nursing note and vitals reviewed. Constitutional: She is oriented to person, place, and time. She appears well-developed and well-nourished. No distress.  HENT:  Head: Normocephalic.  Eyes: Pupils are equal, round, and reactive to light.  Cardiovascular:  Normal rate, regular rhythm and normal heart sounds.  Respiratory: Effort normal and breath sounds normal. No respiratory distress.  GI: Soft. Bowel sounds are normal. She exhibits no distension. There is no tenderness.  Neurological: She is alert and oriented to person, place, and time.  Skin: Skin is warm and dry.  Psychiatric: She has a normal mood and affect. Her behavior is normal. Judgment and thought content normal.   Fetal Tracing:  Baseline: 130 Variability: moderate Accels: 15x15 Decels: variable, prolonged  Toco: none  MAU Course  Procedures  MDM NST Reviewed tracing with Dr. Earlene Plater- will admit to Antenatal for observation over night and repeat BPP in AM Assessment and Plan   1. Fetal heart rate decelerations affecting management of mother  2. Chronic hypertension during pregnancy, antepartum   3. Gestational diabetes mellitus (GDM) in third trimester controlled on oral hypoglycemic drug    -Admit to antenatal for observation -Repeat BPP in AM  FHR tracing was reassuring overnight, repeat BPP was pending at the time of this note   Consults: None  Significant Diagnostic Studies: labs:  CBC    Component Value Date/Time   WBC 7.1 03/19/2017 1400   RBC 3.80 (L) 03/19/2017 1400   HGB 11.7 (L) 03/19/2017 1400   HGB 11.9 03/03/2017 1611   HCT 33.6 (L) 03/19/2017 1400   HCT 34.7 03/03/2017 1611   PLT 264 03/19/2017 1400   PLT 271 03/03/2017 1611   MCV 88.4 03/19/2017 1400   MCV 89 03/03/2017 1611   MCH 30.8 03/19/2017 1400   MCHC 34.8 03/19/2017 1400   RDW 15.7 (H) 03/19/2017 1400   RDW 15.5 (H) 03/03/2017 1611   LYMPHSABS 2.3 09/15/2016 1050   MONOABS 0.2 09/26/2015 0834   EOSABS 0.2 09/15/2016 1050   BASOSABS 0.0 09/15/2016 1050     Treatments: none  Discharge Exam: Blood pressure 117/75, pulse (!) 110, temperature 98.2 F (36.8 C), temperature source Oral, resp. rate 18, height 5\' 4"  (1.626 m), weight 79.6 kg (175 lb 7 oz), last menstrual  period 06/29/2016, SpO2 98 %.  Cervical Exam  Dilation 1 filed at 03/20/2017 1610  Cervical Position Posterior filed at 03/20/2017 9604  Cervical Consistency Soft filed at 03/20/2017 5409    Disposition: 01-Home or Self Care   Allergies as of 03/20/2017      Reactions   Ciprofloxacin Anaphylaxis, Swelling   Throat swells   Clindamycin/lincomycin Anaphylaxis, Swelling   Sulfamethoxazole-trimethoprim Swelling   Swelling is of the lips. Swelling is of the lips.   Amoxicillin Rash   Has patient had a PCN reaction causing immediate rash, facial/tongue/throat swelling, SOB or lightheadedness with hypotension: no Has patient had a PCN reaction causing severe rash involving mucus membranes or skin necrosis: pt had rash -- non severe  Has patient had a PCN reaction that required hospitalization: no Has patient had a PCN reaction occurring within the last 10 years: no If all of the above answers are "NO", then may proceed with Cephalosporin use.      Medication List    STOP taking these medications   metoCLOPramide 5 MG tablet Commonly known as:  REGLAN     TAKE these medications   acetaminophen 500 MG tablet Commonly known as:  TYLENOL Take 1,000 mg by mouth every 6 (six) hours as needed for mild pain, moderate pain or headache.   albuterol 108 (90 Base) MCG/ACT inhaler Commonly known as:  PROVENTIL HFA;VENTOLIN HFA Inhale 1-2 puffs into the lungs every 6 (six) hours as needed for wheezing or shortness of breath.   amLODipine 5 MG tablet Commonly known as:  NORVASC take 1 tablet by mouth once daily   calcium carbonate 500 MG chewable tablet Commonly known as:  TUMS - dosed in mg elemental calcium Chew 1 tablet by mouth daily as needed for heartburn.   cetirizine 10 MG tablet Commonly known as:  ZYRTEC Take 10 mg by mouth daily.   COMFORT FIT MATERNITY SUPP MED Misc Wear daily when ambulating   CORICIDIN HBP COUGH/COLD PO Take 1 tablet by mouth daily as needed (for  cough).   mesalamine 1000 MG suppository Commonly known as:  CANASA Place 1,000 mg rectally as needed (Ulcerative Proctitis).   pantoprazole 40 MG tablet Commonly known as:  PROTONIX  Take 40 mg by mouth daily.   PNV PO Take 1 tablet by mouth daily.   valACYclovir 500 MG tablet Commonly known as:  VALTREX Take 1 tablet (500 mg total) by mouth daily.      Follow-up Information    [facility] Follow up in 6 day(s).   Specialty:  Obstetrics and Gynecology Contact information: [address] [place] [address] [phone]       [facility] Follow up in 3 day(s).   Specialty:  Maternal and Fetal Medicine Why:  [place] Contact information: [address] [id number] mc [place] [address] [phone]          Signed: Scheryl Darter 03/20/2017, 6:39 AM

## 2017-03-20 NOTE — Progress Notes (Signed)

## 2017-03-22 ENCOUNTER — Telehealth (HOSPITAL_COMMUNITY): Payer: Self-pay | Admitting: *Deleted

## 2017-03-22 ENCOUNTER — Encounter (HOSPITAL_COMMUNITY): Payer: Self-pay | Admitting: *Deleted

## 2017-03-22 NOTE — Telephone Encounter (Signed)
Preadmission screen  

## 2017-03-23 ENCOUNTER — Ambulatory Visit (INDEPENDENT_AMBULATORY_CARE_PROVIDER_SITE_OTHER): Payer: Medicaid Other | Admitting: Certified Nurse Midwife

## 2017-03-23 ENCOUNTER — Encounter: Payer: Self-pay | Admitting: Certified Nurse Midwife

## 2017-03-23 ENCOUNTER — Ambulatory Visit (INDEPENDENT_AMBULATORY_CARE_PROVIDER_SITE_OTHER): Payer: Medicaid Other | Admitting: *Deleted

## 2017-03-23 ENCOUNTER — Ambulatory Visit: Payer: Self-pay

## 2017-03-23 VITALS — BP 139/86 | HR 93 | Wt 184.0 lb

## 2017-03-23 DIAGNOSIS — O10919 Unspecified pre-existing hypertension complicating pregnancy, unspecified trimester: Secondary | ICD-10-CM

## 2017-03-23 DIAGNOSIS — O09523 Supervision of elderly multigravida, third trimester: Secondary | ICD-10-CM | POA: Diagnosis not present

## 2017-03-23 DIAGNOSIS — O99283 Endocrine, nutritional and metabolic diseases complicating pregnancy, third trimester: Secondary | ICD-10-CM

## 2017-03-23 DIAGNOSIS — O09529 Supervision of elderly multigravida, unspecified trimester: Secondary | ICD-10-CM

## 2017-03-23 DIAGNOSIS — O10913 Unspecified pre-existing hypertension complicating pregnancy, third trimester: Secondary | ICD-10-CM | POA: Diagnosis not present

## 2017-03-23 DIAGNOSIS — E0590 Thyrotoxicosis, unspecified without thyrotoxic crisis or storm: Secondary | ICD-10-CM

## 2017-03-23 NOTE — Progress Notes (Signed)
Pt scheduled for NST/BPP at [facility] today. IOL scheduled 03/30/17

## 2017-03-23 NOTE — Progress Notes (Signed)
   PRENATAL VISIT NOTE  Subjective:  Sue [name] is a 42 y.o. T3S2876 at [redacted]w[redacted]d being seen today for ongoing prenatal care.  She is currently monitored for the following issues for this high-risk pregnancy and has Essential hypertension; HERNIATED DISC; Carpal tunnel syndrome; Sacroiliac dysfunction; Mid back pain; Seasonal allergies; Acute anterior uveitis; Healthcare maintenance; Fatty liver; Hiatal hernia; Overweight (BMI 25.0-29.9); Alopecia; Advanced maternal age in multigravida, unspecified trimester; Encounter for supervision of high risk multigravida of advanced maternal age, antepartum; Chronic hypertension during pregnancy, antepartum; History of ELISA positive for HSV; Hyperthyroidism affecting pregnancy; GERD (gastroesophageal reflux disease); Rheumatoid arteritis; and Non-reassuring electronic fetal monitoring tracing on their problem list.  Patient reports no complaints.  Contractions: Irritability. Vag. Bleeding: None.  Movement: Present. Denies leaking of fluid.   The following portions of the patient's history were reviewed and updated as appropriate: allergies, current medications, past family history, past medical history, past social history, past surgical history and problem list. Problem list updated.  Objective:   Vitals:   03/23/17 0934  BP: 139/86  Pulse: 93  Weight: 184 lb (83.5 kg)    Fetal Status: Fetal Heart Rate (bpm): 152; doppler Fundal Height: 38 cm Movement: Present     General:  Alert, oriented and cooperative. Patient is in no acute distress.  Skin: Skin is warm and dry. No rash noted.   Cardiovascular: Normal heart rate noted  Respiratory: Normal respiratory effort, no problems with respiration noted  Abdomen: Soft, gravid, appropriate for gestational age.  Pain/Pressure: Present     Pelvic: Cervical exam deferred        Extremities: Normal range of motion.  Edema: Trace  Mental Status:  Normal mood and affect. Normal behavior. Normal judgment and  thought content.   Assessment and Plan:  Pregnancy: O1L5726 at [redacted]w[redacted]d  1. Encounter for supervision of high risk multigravida of advanced maternal age, antepartum     Doing well.  BPP 03/21/17 8/8, has BPP with NST schedule for this morning.  Has IOL scheduled 03/30/17.    2. Chronic hypertension during pregnancy, antepartum     Stable on meds  3. Hyperthyroidism affecting pregnancy in third trimester     Stable has F/U scheduled PP.   4. Advanced maternal age in multigravida, unspecified trimester    > 40 years  Term labor symptoms and general obstetric precautions including but not limited to vaginal bleeding, contractions, leaking of fluid and fetal movement were reviewed in detail with the patient. Please refer to After Visit Summary for other counseling recommendations.  Return in about 4 weeks (around 04/20/2017) for Postpartum.   Roe Coombs, CNM

## 2017-03-26 ENCOUNTER — Other Ambulatory Visit: Payer: Self-pay | Admitting: Family Medicine

## 2017-03-29 ENCOUNTER — Other Ambulatory Visit: Payer: Self-pay | Admitting: Certified Nurse Midwife

## 2017-03-30 ENCOUNTER — Inpatient Hospital Stay (HOSPITAL_COMMUNITY)
Admission: AD | Admit: 2017-03-30 | Discharge: 2017-04-01 | DRG: 806 | Disposition: A | Discharge status: Home / Self Care | Payer: Medicaid Other | Source: Ambulatory Visit | Attending: Family Medicine | Admitting: Family Medicine

## 2017-03-30 ENCOUNTER — Inpatient Hospital Stay (HOSPITAL_COMMUNITY): Payer: Medicaid Other | Admitting: Anesthesiology

## 2017-03-30 ENCOUNTER — Inpatient Hospital Stay (HOSPITAL_COMMUNITY)
Admission: AD | Admit: 2017-03-30 | Payer: Medicaid Other | Source: Ambulatory Visit | Admitting: Obstetrics & Gynecology

## 2017-03-30 ENCOUNTER — Encounter (HOSPITAL_COMMUNITY): Payer: Self-pay

## 2017-03-30 DIAGNOSIS — M069 Rheumatoid arthritis, unspecified: Secondary | ICD-10-CM | POA: Diagnosis present

## 2017-03-30 DIAGNOSIS — O9832 Other infections with a predominantly sexual mode of transmission complicating childbirth: Secondary | ICD-10-CM | POA: Diagnosis present

## 2017-03-30 DIAGNOSIS — O9952 Diseases of the respiratory system complicating childbirth: Secondary | ICD-10-CM | POA: Diagnosis present

## 2017-03-30 DIAGNOSIS — O9962 Diseases of the digestive system complicating childbirth: Secondary | ICD-10-CM | POA: Diagnosis present

## 2017-03-30 DIAGNOSIS — Z3A39 39 weeks gestation of pregnancy: Secondary | ICD-10-CM

## 2017-03-30 DIAGNOSIS — K219 Gastro-esophageal reflux disease without esophagitis: Secondary | ICD-10-CM | POA: Diagnosis present

## 2017-03-30 DIAGNOSIS — O99284 Endocrine, nutritional and metabolic diseases complicating childbirth: Secondary | ICD-10-CM | POA: Diagnosis present

## 2017-03-30 DIAGNOSIS — A6000 Herpesviral infection of urogenital system, unspecified: Secondary | ICD-10-CM | POA: Diagnosis present

## 2017-03-30 DIAGNOSIS — Z880 Allergy status to penicillin: Secondary | ICD-10-CM

## 2017-03-30 DIAGNOSIS — O99824 Streptococcus B carrier state complicating childbirth: Secondary | ICD-10-CM | POA: Diagnosis not present

## 2017-03-30 DIAGNOSIS — O1002 Pre-existing essential hypertension complicating childbirth: Principal | ICD-10-CM | POA: Diagnosis present

## 2017-03-30 DIAGNOSIS — I10 Essential (primary) hypertension: Secondary | ICD-10-CM | POA: Diagnosis present

## 2017-03-30 DIAGNOSIS — O99280 Endocrine, nutritional and metabolic diseases complicating pregnancy, unspecified trimester: Secondary | ICD-10-CM | POA: Diagnosis present

## 2017-03-30 DIAGNOSIS — E0590 Thyrotoxicosis, unspecified without thyrotoxic crisis or storm: Secondary | ICD-10-CM | POA: Diagnosis present

## 2017-03-30 DIAGNOSIS — O114 Pre-existing hypertension with pre-eclampsia, complicating childbirth: Secondary | ICD-10-CM | POA: Diagnosis not present

## 2017-03-30 DIAGNOSIS — O10919 Unspecified pre-existing hypertension complicating pregnancy, unspecified trimester: Secondary | ICD-10-CM

## 2017-03-30 DIAGNOSIS — M0520 Rheumatoid vasculitis with rheumatoid arthritis of unspecified site: Secondary | ICD-10-CM | POA: Diagnosis present

## 2017-03-30 DIAGNOSIS — Z8619 Personal history of other infectious and parasitic diseases: Secondary | ICD-10-CM | POA: Diagnosis present

## 2017-03-30 DIAGNOSIS — J45909 Unspecified asthma, uncomplicated: Secondary | ICD-10-CM | POA: Diagnosis present

## 2017-03-30 DIAGNOSIS — O09529 Supervision of elderly multigravida, unspecified trimester: Secondary | ICD-10-CM

## 2017-03-30 LAB — CBC
HCT: 33.7 % — ABNORMAL LOW (ref 36.0–46.0)
Hemoglobin: 11.8 g/dL — ABNORMAL LOW (ref 12.0–15.0)
MCH: 30.7 pg (ref 26.0–34.0)
MCHC: 35 g/dL (ref 30.0–36.0)
MCV: 87.8 fL (ref 78.0–100.0)
Platelets: 272 10*3/uL (ref 150–400)
RBC: 3.84 MIL/uL — ABNORMAL LOW (ref 3.87–5.11)
RDW: 16 % — ABNORMAL HIGH (ref 11.5–15.5)
WBC: 8.7 10*3/uL (ref 4.0–10.5)

## 2017-03-30 LAB — RPR: RPR Ser Ql: NONREACTIVE

## 2017-03-30 LAB — TYPE AND SCREEN
ABO/RH(D): O POS
Antibody Screen: NEGATIVE

## 2017-03-30 MED ORDER — OXYTOCIN BOLUS FROM INFUSION: 500.0000 mL | Freq: Once | INTRAVENOUS | Status: DC

## 2017-03-30 MED ORDER — PHENYLEPHRINE 40 MCG/ML (10ML) SYRINGE FOR IV PUSH (FOR BLOOD PRESSURE SUPPORT)
80.0000 ug | PREFILLED_SYRINGE | INTRAVENOUS | Status: DC | PRN
  Filled 2017-03-30: qty 10
  Filled 2017-03-30: qty 5

## 2017-03-30 MED ORDER — TERBUTALINE SULFATE 1 MG/ML IJ SOLN
0.2500 mg | Freq: Once | INTRAMUSCULAR | Status: DC | PRN
  Filled 2017-03-30: qty 1

## 2017-03-30 MED ORDER — MESALAMINE 1000 MG RE SUPP: 1000.0000 mg | RECTAL | Status: DC | PRN

## 2017-03-30 MED ORDER — FENTANYL CITRATE (PF) 100 MCG/2ML IJ SOLN
100.0000 ug | INTRAMUSCULAR | Status: DC | PRN
  Administered 2017-03-30 (×5): 100 ug via INTRAVENOUS
  Filled 2017-03-30 (×5): qty 2

## 2017-03-30 MED ORDER — ZOLPIDEM TARTRATE 5 MG PO TABS: 5.0000 mg | ORAL_TABLET | Freq: Every evening | ORAL | Status: DC | PRN

## 2017-03-30 MED ORDER — DIPHENHYDRAMINE HCL 50 MG/ML IJ SOLN: 12.5000 mg | INTRAMUSCULAR | Status: DC | PRN

## 2017-03-30 MED ORDER — LACTATED RINGERS IV SOLN: 500.0000 mL | Freq: Once | INTRAVENOUS | Status: DC

## 2017-03-30 MED ORDER — SOD CITRATE-CITRIC ACID 500-334 MG/5ML PO SOLN: 30.0000 mL | ORAL | Status: DC | PRN

## 2017-03-30 MED ORDER — WITCH HAZEL-GLYCERIN EX PADS: 1.0000 "application " | MEDICATED_PAD | CUTANEOUS | Status: DC | PRN

## 2017-03-30 MED ORDER — MISOPROSTOL 25 MCG QUARTER TABLET
25.0000 ug | ORAL_TABLET | ORAL | Status: DC | PRN
  Administered 2017-03-30: 25 ug via VAGINAL
  Filled 2017-03-30: qty 1

## 2017-03-30 MED ORDER — OXYTOCIN 40 UNITS IN LACTATED RINGERS INFUSION - SIMPLE MED
1.0000 m[IU]/min | INTRAVENOUS | Status: DC
  Administered 2017-03-30 (×2): 2 m[IU]/min via INTRAVENOUS
  Filled 2017-03-30: qty 1000

## 2017-03-30 MED ORDER — PANTOPRAZOLE SODIUM 40 MG PO TBEC
40.0000 mg | DELAYED_RELEASE_TABLET | Freq: Every day | ORAL | Status: DC
  Administered 2017-03-31 – 2017-04-01 (×2): 40 mg via ORAL
  Filled 2017-03-30 (×2): qty 1

## 2017-03-30 MED ORDER — DIBUCAINE 1 % RE OINT: 1.0000 "application " | TOPICAL_OINTMENT | RECTAL | Status: DC | PRN

## 2017-03-30 MED ORDER — ONDANSETRON HCL 4 MG PO TABS: 4.0000 mg | ORAL_TABLET | ORAL | Status: DC | PRN

## 2017-03-30 MED ORDER — DIPHENHYDRAMINE HCL 25 MG PO CAPS: 25.0000 mg | ORAL_CAPSULE | Freq: Four times a day (QID) | ORAL | Status: DC | PRN

## 2017-03-30 MED ORDER — ACETAMINOPHEN 325 MG PO TABS: 650.0000 mg | ORAL_TABLET | ORAL | Status: DC | PRN

## 2017-03-30 MED ORDER — EPHEDRINE 5 MG/ML INJ
10.0000 mg | INTRAVENOUS | Status: DC | PRN
  Filled 2017-03-30: qty 2

## 2017-03-30 MED ORDER — BENZOCAINE-MENTHOL 20-0.5 % EX AERO: 1.0000 "application " | INHALATION_SPRAY | CUTANEOUS | Status: DC | PRN

## 2017-03-30 MED ORDER — ONDANSETRON HCL 4 MG/2ML IJ SOLN: 4.0000 mg | INTRAMUSCULAR | Status: DC | PRN

## 2017-03-30 MED ORDER — TETANUS-DIPHTH-ACELL PERTUSSIS 5-2.5-18.5 LF-MCG/0.5 IM SUSP: 0.5000 mL | Freq: Once | INTRAMUSCULAR | Status: DC

## 2017-03-30 MED ORDER — PRENATAL MULTIVITAMIN CH
1.0000 | ORAL_TABLET | Freq: Every day | ORAL | Status: DC
  Administered 2017-03-31: 1 via ORAL
  Filled 2017-03-30: qty 1

## 2017-03-30 MED ORDER — PHENYLEPHRINE 40 MCG/ML (10ML) SYRINGE FOR IV PUSH (FOR BLOOD PRESSURE SUPPORT)
80.0000 ug | PREFILLED_SYRINGE | INTRAVENOUS | Status: DC | PRN
  Filled 2017-03-30: qty 5

## 2017-03-30 MED ORDER — OXYTOCIN 40 UNITS IN LACTATED RINGERS INFUSION - SIMPLE MED: 2.5000 [IU]/h | INTRAVENOUS | Status: DC

## 2017-03-30 MED ORDER — MISOPROSTOL 50MCG HALF TABLET
50.0000 ug | ORAL_TABLET | ORAL | Status: DC | PRN
  Filled 2017-03-30: qty 1

## 2017-03-30 MED ORDER — OXYCODONE-ACETAMINOPHEN 5-325 MG PO TABS: 2.0000 | ORAL_TABLET | ORAL | Status: DC | PRN

## 2017-03-30 MED ORDER — LACTATED RINGERS IV SOLN
INTRAVENOUS | Status: DC
  Administered 2017-03-30: 08:00:00 via INTRAVENOUS

## 2017-03-30 MED ORDER — FENTANYL 2.5 MCG/ML BUPIVACAINE 1/10 % EPIDURAL INFUSION (WH - ANES)
14.0000 mL/h | INTRAMUSCULAR | Status: DC | PRN
  Administered 2017-03-30 (×2): 14 mL/h via EPIDURAL
  Filled 2017-03-30: qty 100

## 2017-03-30 MED ORDER — ONDANSETRON HCL 4 MG/2ML IJ SOLN
4.0000 mg | Freq: Four times a day (QID) | INTRAMUSCULAR | Status: DC | PRN
  Administered 2017-03-30: 4 mg via INTRAVENOUS
  Filled 2017-03-30: qty 2

## 2017-03-30 MED ORDER — LIDOCAINE HCL (PF) 1 % IJ SOLN
INTRAMUSCULAR | Status: DC | PRN
  Administered 2017-03-30: 4 mL
  Administered 2017-03-30: 6 mL via EPIDURAL

## 2017-03-30 MED ORDER — AMLODIPINE BESYLATE 5 MG PO TABS
5.0000 mg | ORAL_TABLET | Freq: Every day | ORAL | Status: DC
  Administered 2017-03-30 – 2017-04-01 (×3): 5 mg via ORAL
  Filled 2017-03-30 (×3): qty 1

## 2017-03-30 MED ORDER — IBUPROFEN 600 MG PO TABS
600.0000 mg | ORAL_TABLET | Freq: Four times a day (QID) | ORAL | Status: DC
  Administered 2017-03-31 – 2017-04-01 (×6): 600 mg via ORAL
  Filled 2017-03-30 (×6): qty 1

## 2017-03-30 MED ORDER — OXYCODONE-ACETAMINOPHEN 5-325 MG PO TABS
1.0000 | ORAL_TABLET | ORAL | Status: DC | PRN
  Filled 2017-03-30: qty 1

## 2017-03-30 MED ORDER — SIMETHICONE 80 MG PO CHEW: 80.0000 mg | CHEWABLE_TABLET | ORAL | Status: DC | PRN

## 2017-03-30 MED ORDER — OXYCODONE-ACETAMINOPHEN 5-325 MG PO TABS: 1.0000 | ORAL_TABLET | ORAL | Status: DC | PRN

## 2017-03-30 MED ORDER — COCONUT OIL OIL: 1.0000 "application " | TOPICAL_OIL | Status: DC | PRN

## 2017-03-30 MED ORDER — SENNOSIDES-DOCUSATE SODIUM 8.6-50 MG PO TABS
2.0000 | ORAL_TABLET | ORAL | Status: DC
  Administered 2017-03-31 – 2017-04-01 (×2): 2 via ORAL
  Filled 2017-03-30 (×2): qty 2

## 2017-03-30 MED ORDER — VALACYCLOVIR HCL 500 MG PO TABS
500.0000 mg | ORAL_TABLET | Freq: Every day | ORAL | Status: DC
  Administered 2017-03-30 – 2017-04-01 (×3): 500 mg via ORAL
  Filled 2017-03-30 (×3): qty 1

## 2017-03-30 MED ORDER — LIDOCAINE HCL (PF) 1 % IJ SOLN
30.0000 mL | INTRAMUSCULAR | Status: DC | PRN
  Filled 2017-03-30: qty 30

## 2017-03-30 MED ORDER — LACTATED RINGERS IV SOLN: 500.0000 mL | INTRAVENOUS | Status: DC | PRN

## 2017-03-30 MED ORDER — FLEET ENEMA 7-19 GM/118ML RE ENEM: 1.0000 | ENEMA | RECTAL | Status: DC | PRN

## 2017-03-30 NOTE — Anesthesia Preprocedure Evaluation (Signed)
Anesthesia Evaluation  Patient identified by MRN, date of birth, ID band Patient awake    Reviewed: Allergy & Precautions, H&P , Patient's Chart, lab work & pertinent test results  Airway Mallampati: II  TM Distance: >3 FB Neck ROM: full    Dental no notable dental hx.    Pulmonary asthma ,    Pulmonary exam normal breath sounds clear to auscultation       Cardiovascular Exercise Tolerance: Good hypertension,  Rhythm:regular Rate:Normal     Neuro/Psych    GI/Hepatic   Endo/Other    Renal/GU      Musculoskeletal   Abdominal   Peds  Hematology   Anesthesia Other Findings Asthma  Hypertension      Reproductive/Obstetrics                             Anesthesia Physical Anesthesia Plan  ASA: II  Anesthesia Plan: Epidural   Post-op Pain Management:    Induction:   PONV Risk Score and Plan:   Airway Management Planned:   Additional Equipment:   Intra-op Plan:   Post-operative Plan:   Informed Consent: I have reviewed the patients History and Physical, chart, labs and discussed the procedure including the risks, benefits and alternatives for the proposed anesthesia with the patient or authorized representative who has indicated his/her understanding and acceptance.   Dental Advisory Given  Plan Discussed with:   Anesthesia Plan Comments: (Labs checked- platelets confirmed with RN in room. Fetal heart tracing, per RN, reported to be stable enough for sitting procedure. Discussed epidural, and patient consents to the procedure:  included risk of possible headache,backache, failed block, allergic reaction, and nerve injury. This patient was asked if she had any questions or concerns before the procedure started.)        Anesthesia Quick Evaluation

## 2017-03-30 NOTE — Progress Notes (Signed)
Labor Progress Note Sue [name] is a 42 y.o. U1L2440 at [redacted]w[redacted]d presented for here for IOL for cHTN. S: Feeling very uncomfortable from contractions  O:  BP (!) 147/79   Pulse 86   Temp 98.4 F (36.9 C) (Oral)   Resp 18   Ht 5\' 4"  (1.626 m)   Wt 83.2 kg (183 lb 8 oz)   LMP 06/29/2016 (Approximate)   BMI 31.50 kg/m  EFM: 130 bpm/mod var/pos acels  CVE: Dilation: 4 Effacement (%): 50 Cervical Position: Posterior Station: -3 Presentation: Vertex Exam by:: D Herr rn   A&P: 42 y.o. 46 [redacted]w[redacted]d here for IOL for cHTN #Labor: No cervical change. This is surprising because patient appears uncomfortable and contracting every 1 minute. Will reassess in 2 hours, likely AROM and place IUPC if still unchanged. #cHTN: well controlled without meds  Laverna Dossett, DO 3:10 PM

## 2017-03-30 NOTE — Anesthesia Pain Management Evaluation Note (Signed)
  CRNA Pain Management Visit Note  Patient: Sue [name], 43 y.o., female  "Hello I am a member of the anesthesia team at [facility]. We have an anesthesia team available at all times to provide care throughout the hospital, including epidural management and anesthesia for C-section. I don't know your plan for the delivery whether it a natural birth, water birth, IV sedation, nitrous supplementation, doula or epidural, but we want to meet your pain goals."   1.Was your pain managed to your expectations on prior hospitalizations?   Yes   2.What is your expectation for pain management during this hospitalization?     IV pain meds  3.How can we help you reach that goal? unsure  Record the patient's initial score and the patient's pain goal.   Pain: 7  Pain Goal: 10 [facility] wants you to be able to say your pain was always managed very well.  Cephus Shelling 03/30/2017

## 2017-03-30 NOTE — Anesthesia Procedure Notes (Signed)
Epidural Patient location during procedure: OB  Staffing Anesthesiologist: Deniss Wormley, MD  Preanesthetic Checklist Completed: patient identified, pre-op evaluation, timeout performed, IV checked, risks and benefits discussed and monitors and equipment checked  Epidural Patient position: sitting Prep: DuraPrep Patient monitoring: blood pressure and continuous pulse ox Approach: right paramedian Location: L3-L4 Injection technique: LOR air  Needle:  Needle type: Tuohy  Needle gauge: 17 G Needle insertion depth: 7 cm Catheter type: closed end flexible Catheter size: 19 Gauge Catheter at skin depth: 14 cm Test dose: negative  Assessment Sensory level: T8  Additional Notes Dosing of Epidural:  1st dose, through catheter .............................................  Xylocaine 40 mg  2nd dose, through catheter, after waiting 3 minutes.........Xylocaine 60 mg    As each dose occurred, patient was free of IV sx; and patient exhibited no evidence of SA injection.  Patient is more comfortable after epidural dosed. Please see RN's note for documentation of vital signs,and FHR which are stable.  Patient reminded not to try to ambulate with numb legs, and that an RN must be present when she attempts to get up.            

## 2017-03-30 NOTE — H&P (Signed)
OBSTETRIC ADMISSION HISTORY AND PHYSICAL  Sue [name] is a 42 y.o. female [id number] with IUP at [redacted]w[redacted]d by LMP presenting for IOL for AMA and chronic HTN, c/b hyperthyroidism (on methimazole), HSV+, RA, GERD. She reports +FMs, No LOF, no VB, no blurry vision, headaches or peripheral edema, and RUQ pain.  She plans on bottle feeding. She request OCP's/ vasectomy for birth control. She received her prenatal care at [facility]. Pelvis proven to 7lb 12oz. HTN managed with norvasc.  Dating: By LMP --->  Estimated Date of Delivery: 04/05/17  Sono:  @[redacted]w[redacted]d , CWD, normal anatomy, cephalic presentation, 59% EFW   Prenatal History/Complications:  Past Medical History: Past Medical History:  Diagnosis Date  . Asthma    mild by PFT's 03/16/1998  . Cervical lymphadenopathy    biopsy negativeMarvetta Gibbons)  . Chronic hypertension   . Complete spontaneous abortion    x2  . GERD (gastroesophageal reflux disease)   . History of abnormal Pap smear    s/p cryo( Dr. Wiliam Ke)  . HSV (herpes simplex virus) anogenital infection   . Hypertension   . Kidney stone   . Normal vaginal delivery    x3  . Rheumatoid arthritis (HCC)   . Thyroid disease affecting pregnancy   . 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   . Vaginal Pap smear, abnormal     Past Surgical History: Past Surgical History:  Procedure Laterality Date  . biopsy of lymph node in R neck  2007.  Marland Kitchen LEEP      Obstetrical History: OB History    Gravida Para Term Preterm AB Living   7 3 2 1 3 3    SAB TAB Ectopic Multiple Live Births   3       3      Social History: Social History   Socioeconomic History  . Marital status: Single    Spouse name: Not on file  . Number of children: Not on file  . Years of education: Not on file  . Highest education level: Not on file  Social Needs  . Financial  resource strain: Not on file  . Food insecurity - worry: Not on file  . Food insecurity - inability: Not on file  . Transportation needs - medical: Not on file  . Transportation needs - non-medical: Not on file  Occupational History  . Not on file  Tobacco Use  . Smoking status: Never Smoker  . Smokeless tobacco: Never Used  Substance and Sexual Activity  . Alcohol use: No    Alcohol/week: 0.0 oz    Comment: occasional beer and liquor on weekends  . Drug use: No  . Sexual activity: Yes    Birth control/protection: None  Other Topics Concern  . Not on file  Social History Narrative  . Not on file    Family History: Family History  Problem Relation Age of Onset  . Ulcerative colitis Mother   . Hypertension Mother   . Diabetes Father   . Heart attack Father   . Hypertension Father   . Diabetes Sister   . Thyroid disease Sister   . Heart attack Maternal Grandmother   . Stroke Maternal Grandmother     Allergies: Allergies  Allergen Reactions  . Ciprofloxacin Anaphylaxis and Swelling    Throat swells  . Clindamycin/Lincomycin Anaphylaxis and Swelling  . Sulfamethoxazole-Trimethoprim Swelling    Swelling  is of the lips. Swelling is of the lips.  . Amoxicillin Rash    Has patient had a PCN reaction causing immediate rash, facial/tongue/throat swelling, SOB or lightheadedness with hypotension: no Has patient had a PCN reaction causing severe rash involving mucus membranes or skin necrosis: pt had rash -- non severe  Has patient had a PCN reaction that required hospitalization: no Has patient had a PCN reaction occurring within the last 10 years: no If all of the above answers are "NO", then may proceed with Cephalosporin use.     Medications Prior to Admission  Medication Sig Dispense Refill Last Dose  . acetaminophen (TYLENOL) 500 MG tablet Take 1,000 mg by mouth every 6 (six) hours as needed for mild pain, moderate pain or headache.   Taking  . albuterol (PROVENTIL  HFA;VENTOLIN HFA) 108 (90 Base) MCG/ACT inhaler Inhale 1-2 puffs into the lungs every 6 (six) hours as needed for wheezing or shortness of breath. 1 Inhaler 0 Taking  . amLODipine (NORVASC) 5 MG tablet take 1 tablet by mouth once daily 30 tablet 11 Taking  . calcium carbonate (TUMS - DOSED IN MG ELEMENTAL CALCIUM) 500 MG chewable tablet Chew 1 tablet by mouth daily as needed for heartburn.    Taking  . cetirizine (ZYRTEC) 10 MG tablet Take 10 mg by mouth daily.   Taking  . Chlorpheniramine-DM (CORICIDIN HBP COUGH/COLD PO) Take 1 tablet by mouth daily as needed (for cough).    Taking  . Elastic Bandages & Supports (COMFORT FIT MATERNITY SUPP MED) MISC Wear daily when ambulating (Patient not taking: Reported on 03/23/2017) 1 each 0 Not Taking  . mesalamine (CANASA) 1000 MG suppository Place 1,000 mg rectally as needed (Ulcerative Proctitis).    Taking  . pantoprazole (PROTONIX) 40 MG tablet Take 40 mg by mouth daily.  1 Taking  . Prenatal Vit w/Fe-Methylfol-FA (PNV PO) Take 1 tablet by mouth daily.    Taking  . valACYclovir (VALTREX) 500 MG tablet Take 1 tablet (500 mg total) by mouth daily. 30 tablet 1 Taking     Review of Systems   All systems reviewed and negative except as stated in HPI  Last menstrual period 06/29/2016. General appearance: alert, cooperative, appears stated age and no distress Lungs: clear to auscultation bilaterally Heart: regular rate and rhythm Abdomen: soft, non-tender; bowel sounds normal Extremities: Homans sign is negative, no sign of DVT Presentation: cephalic Fetal monitoringBaseline: 140 bpm, Variability: Good {> 6 bpm), Accelerations: Reactive and Decelerations: Absent Uterine activityFrequency: Every 2-4 minutes     Prenatal labs: ABO, Rh: --/--/O POS, O POS (01/11 1353) Antibody: NEG (01/11 1353) Rubella: 3.48 (07/10 1050) RPR: Non Reactive (10/25 1108)  HBsAg: Negative (07/10 1050)  HIV: Non Reactive (10/25 1108)  GBS: Negative (12/26 0000)  2 hr  Glucola WNL Genetic screening  normal Anatomy US normal female  Prenatal Transfer Tool  Maternal Diabetes: No Genetic Screening: Normal Maternal Ultrasounds/Referrals: Normal Fetal Ultrasounds or other Referrals:  Referred to Materal Fetal Medicine  Maternal Substance Abuse:  No Significant Maternal Medications:  Meds include: Other:  methimazole, norvasc 5, canasa supp, protonix Significant Maternal Lab Results: Lab values include: Group B Strep negative  No results found for this or any previous visit (from the past 24 hour(s)).  Patient Active Problem List   Diagnosis Date Noted  . Non-reassuring electronic fetal monitoring tracing 03/19/2017  . 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 GERD (gastroesophageal reflux disease) 12/03/2016  . Hyperthyroidism affecting pregnancy 11/03/2016  . History of  ELISA positive for HSV 10/06/2016  . Advanced maternal age in multigravida, unspecified trimester 09/15/2016  . Encounter for supervision of high risk multigravida of advanced maternal age, antepartum 09/15/2016  . Chronic hypertension during pregnancy, antepartum 09/15/2016  . Alopecia 03/27/2016  . Fatty liver 01/05/2016  . Hiatal hernia 01/05/2016  . Overweight (BMI 25.0-29.9) 01/05/2016  . Healthcare maintenance 09/12/2014  . Acute anterior uveitis 12/13/2013  . Mid back pain 11/02/2013  . Seasonal allergies 11/02/2013  . 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 Carpal tunnel syndrome 04/10/2011  . HERNIATED DISC 12/31/2009  . Essential hypertension 06/15/2007    Assessment/Plan:  Sue [name] is a 42 y.o. [id number] at [redacted]w[redacted]d here for IOL for CHTN  #Labor: Induce with cytotec #Pain: May request epidural #FWB: reactive NST #ID:  GBS neg #MOF: bottle #MOC: OCPs & vasectomy #Circ:  n/a  Alroy Bailiff, MD  03/30/2017, 7:06 AM

## 2017-03-30 NOTE — Progress Notes (Signed)
Labor Progress Note Shayma Depaoli is a 42 y.o. [id number] at [redacted]w[redacted]d presented for IOL for cHTN. S: Patient is feeling uncomfortable  O:  BP 139/80   Pulse 92   Temp 98.4 F (36.9 C) (Oral)   Resp 18   Ht 5\' 4"  (1.626 m)   Wt 83.2 kg (183 lb 8 oz)   LMP 06/29/2016 (Approximate)   BMI 31.50 kg/m  EFM: 130 bpm/mod var/pos acels  CVE: Dilation: 4 Effacement (%): 60 Cervical Position: Posterior Station: -1 Presentation: Vertex Exam by:: Dr. [email or web address]   A&P: 42 y.o. 46 [redacted]w[redacted]d here for IOL for cHTN. #Labor: Progressing well with only 1 cytotec. Stop cytotec. Start pitocin. #Pain: IV pain meds at this time. #FWB: cat 1 #cHTN: blood pressure well controlled without any meds.  [redacted]w[redacted]d, DO 12:33 PM

## 2017-03-31 NOTE — Progress Notes (Signed)
Post Partum Day #1  Subjective: no complaints, up ad lib, voiding, tolerating PO, + flatus and baby is doing well. No abdominal pain. Wishes to go home tonight if possible.  Objective: Blood pressure 117/78, pulse 77, temperature 98.2 F (36.8 C), temperature source Oral, resp. rate 18, height 5\' 4"  (1.626 m), weight 83.2 kg (183 lb 8 oz), last menstrual period 06/29/2016, SpO2 98 %, unknown if currently breastfeeding.  Physical Exam:  General: alert, cooperative and no distress Heart: Regular rate and rhythm without murmur Lungs: CTAB without wheezing or Rales Lochia: appropriate Uterine Fundus: firm, non tender. Incision: N/A DVT Evaluation: No evidence of DVT seen on physical exam. No significant calf/ankle edema.  Recent Labs    03/30/17 0738  HGB 11.8*  HCT 33.7*    Assessment/Plan: Plan for discharge tomorrow or later this evening. Continue current care.  MOF: Bottle MOC: Partner is getting vasectomy Circ: N/A   LOS: 1 day   04/01/17 03/31/2017, 7:36 AM   Patient comfortable; no complaints. Bleeding is slowing down. Tolerating food; would like to stay another day.   I confirm that I have verified the information documented in the physician assistant's note and that I have also personally reperformed the physical exam and all medical decision making activities.  04/02/2017 CNM

## 2017-03-31 NOTE — Anesthesia Postprocedure Evaluation (Signed)
Anesthesia Post Note  Patient: Sue [name]  Procedure(s) Performed: AN AD HOC LABOR EPIDURAL     Patient location during evaluation: Mother Baby Anesthesia Type: Epidural Level of consciousness: awake Pain management: satisfactory to patient Vital Signs Assessment: post-procedure vital signs reviewed and stable Respiratory status: spontaneous breathing Cardiovascular status: stable Anesthetic complications: no    Last Vitals:  Vitals:   03/31/17 0017 03/31/17 0621  BP: 134/75 117/78  Pulse: 97 77  Resp:    Temp: 36.7 C 36.8 C  SpO2: 98%     Last Pain:  Vitals:   03/31/17 0621  TempSrc: Oral  PainSc:    Pain Goal:                 Sue [name]

## 2017-04-01 MED ORDER — IBUPROFEN 600 MG PO TABS: 600.0000 mg | ORAL_TABLET | Freq: Four times a day (QID) | ORAL | 0 refills | Status: DC

## 2017-04-01 NOTE — Discharge Summary (Signed)
OB Discharge Summary     Patient Name: Sue [name] DOB: Sep 05, 1975 MRN: [id number]  Date of admission: 03/30/2017 Delivering MD: Alroy Bailiff   Date of discharge: 04/01/2017  Admitting diagnosis: INDUCTION Intrauterine pregnancy: [redacted]w[redacted]d     Secondary diagnosis:  Principal Problem:   Encounter for supervision of high risk multigravida of advanced maternal age, antepartum Active Problems:   Essential hypertension   Advanced maternal age in multigravida, unspecified trimester   Chronic hypertension during pregnancy, antepartum   History of ELISA positive for HSV   Hyperthyroidism affecting pregnancy   Rheumatoid arteritis   Non-reassuring electronic fetal monitoring tracing   Chronic hypertension affecting pregnancy     Discharge diagnosis: Term Pregnancy Delivered and CHTN                                                                                                Post partum procedures:n/a  Augmentation: Pitocin and Cytotec  Complications: None  Hospital course:  Induction of Labor With Vaginal Delivery   42 y.o. yo [id number] at [redacted]w[redacted]d was admitted to the hospital 03/30/2017 for induction of labor.  Indication for induction: CHTN.  Patient had an uncomplicated labor course as follows: Membrane Rupture Time/Date: 4:40 PM ,03/30/2017   Intrapartum Procedures: Episiotomy: None [1]                                         Lacerations:     Patient had delivery of a Viable infant.  Information for the patient's newborn:  Subrina, Vecchiarelli [[id number]]  Delivery Method: Vag-Spont   03/30/2017  Details of delivery can be found in separate delivery note.  Patient had a routine postpartum course. Patient is discharged home 04/01/17.  Physical exam  Vitals:   03/31/17 0621 03/31/17 0900 03/31/17 1653 04/01/17 0559  BP: 117/78  139/80 130/88  Pulse: 77  78 73  Resp:   20 16  Temp: 98.2 F (36.8 C)  98.1 F (36.7 C) 98.4 F (36.9 C)  TempSrc: Oral  Oral Oral  SpO2:    100%   Weight:  78.6 kg (173 lb 5 oz)  77.1 kg (170 lb)  Height:       General: alert, cooperative and no distress Lochia: appropriate Uterine Fundus: firm Incision: N/A DVT Evaluation: No evidence of DVT seen on physical exam. Labs: Lab Results  Component Value Date   WBC 8.7 03/30/2017   HGB 11.8 (L) 03/30/2017   HCT 33.7 (L) 03/30/2017   MCV 87.8 03/30/2017   PLT 272 03/30/2017   CMP Latest Ref Rng & Units 03/03/2017  Glucose 65 - 99 mg/dL 70  BUN 6 - 24 mg/dL 5(L)  Creatinine 6.50 - 1.00 mg/dL 3.54(S)  Sodium 568 - 127 mmol/L 139  Potassium 3.5 - 5.2 mmol/L 4.0  Chloride 96 - 106 mmol/L 101  CO2 20 - 29 mmol/L 22  Calcium 8.7 - 10.2 mg/dL 9.3  Total Protein 6.0 - 8.5 g/dL 6.2  Total Bilirubin 0.0 - 1.2 mg/dL  0.4  Alkaline Phos 39 - 117 IU/L 146(H)  AST 0 - 40 IU/L 17  ALT 0 - 32 IU/L 13    Discharge instruction: per After Visit Summary and "Baby and Me Booklet".  After visit meds:  Allergies as of 04/01/2017      Reactions   Ciprofloxacin Anaphylaxis, Swelling   Throat swells   Clindamycin/lincomycin Anaphylaxis, Swelling   Sulfamethoxazole-trimethoprim Swelling   Swelling is of the lips. Swelling is of the lips.   Amoxicillin Rash   Has patient had a PCN reaction causing immediate rash, facial/tongue/throat swelling, SOB or lightheadedness with hypotension: no Has patient had a PCN reaction causing severe rash involving mucus membranes or skin necrosis: pt had rash -- non severe  Has patient had a PCN reaction that required hospitalization: no Has patient had a PCN reaction occurring within the last 10 years: no If all of the above answers are "NO", then may proceed with Cephalosporin use.      Medication List    STOP taking these medications   COMFORT FIT MATERNITY SUPP MED Misc   PNV PO     TAKE these medications   acetaminophen 500 MG tablet Commonly known as:  TYLENOL Take 1,000 mg by mouth every 6 (six) hours as needed for mild pain, moderate pain or  headache.   albuterol 108 (90 Base) MCG/ACT inhaler Commonly known as:  PROVENTIL HFA;VENTOLIN HFA Inhale 1-2 puffs into the lungs every 6 (six) hours as needed for wheezing or shortness of breath.   amLODipine 5 MG tablet Commonly known as:  NORVASC take 1 tablet by mouth once daily   calcium carbonate 500 MG chewable tablet Commonly known as:  TUMS - dosed in mg elemental calcium Chew 1 tablet by mouth daily as needed for heartburn.   cetirizine 10 MG tablet Commonly known as:  ZYRTEC Take 10 mg by mouth daily.   CORICIDIN HBP COUGH/COLD PO Take 1 tablet by mouth daily as needed (for cough).   ibuprofen 600 MG tablet Commonly known as:  ADVIL,MOTRIN Take 1 tablet (600 mg total) by mouth every 6 (six) hours.   mesalamine 1000 MG suppository Commonly known as:  CANASA Place 1,000 mg rectally as needed (Ulcerative Proctitis).   pantoprazole 40 MG tablet Commonly known as:  PROTONIX Take 40 mg by mouth daily.   valACYclovir 500 MG tablet Commonly known as:  VALTREX Take 1 tablet (500 mg total) by mouth daily.       Diet: low salt diet  Activity: Advance as tolerated. Pelvic rest for 6 weeks.   Outpatient follow up:1, 2 weeks Follow up Appt: Future Appointments  Date Time Provider Department Center  04/27/2017  1:00 PM Roe Coombs, CNM [facility] None  05/14/2017 10:45 AM Romero Belling, MD [facility] None   Follow up Visit:No Follow-up on file.  Postpartum contraception: Vasectomy  Newborn Data: Live born female  Birth Weight: 7 lb 4 oz (3289 g) APGAR: 9, 9  Newborn Delivery   Birth date/time:  03/30/2017 16:59:00 Delivery type:  Vaginal, Spontaneous     Baby Feeding: Bottle Disposition:home with mother   04/01/2017 Alroy Bailiff, MD

## 2017-04-02 ENCOUNTER — Other Ambulatory Visit: Payer: Self-pay | Admitting: Certified Nurse Midwife

## 2017-04-02 ENCOUNTER — Telehealth: Payer: Self-pay

## 2017-04-02 MED ORDER — OXYCODONE-ACETAMINOPHEN 5-325 MG PO TABS: 1.0000 | ORAL_TABLET | ORAL | 0 refills | Status: DC | PRN

## 2017-04-02 NOTE — Telephone Encounter (Signed)
TC from pt c/o cramping and low back pain since SVD 03/30/17. She said IB was given but she can't take it d/t ulcers. Consulted with provider. Pain med sent to the pharmacy. Pt agrees and has no further questions.

## 2017-04-27 ENCOUNTER — Ambulatory Visit (INDEPENDENT_AMBULATORY_CARE_PROVIDER_SITE_OTHER): Payer: Medicaid Other | Admitting: Certified Nurse Midwife

## 2017-04-27 ENCOUNTER — Encounter: Payer: Self-pay | Admitting: Certified Nurse Midwife

## 2017-04-27 ENCOUNTER — Other Ambulatory Visit (HOSPITAL_COMMUNITY)
Admission: RE | Admit: 2017-04-27 | Discharge: 2017-04-27 | Disposition: A | Discharge status: Home / Self Care | Payer: Medicaid Other | Source: Ambulatory Visit | Attending: Certified Nurse Midwife | Admitting: Certified Nurse Midwife

## 2017-04-27 VITALS — BP 141/91 | HR 68 | Wt 161.4 lb

## 2017-04-27 DIAGNOSIS — Z3202 Encounter for pregnancy test, result negative: Secondary | ICD-10-CM

## 2017-04-27 DIAGNOSIS — Z30011 Encounter for initial prescription of contraceptive pills: Secondary | ICD-10-CM

## 2017-04-27 DIAGNOSIS — Z3009 Encounter for other general counseling and advice on contraception: Secondary | ICD-10-CM

## 2017-04-27 DIAGNOSIS — I10 Essential (primary) hypertension: Secondary | ICD-10-CM

## 2017-04-27 DIAGNOSIS — Z1389 Encounter for screening for other disorder: Secondary | ICD-10-CM | POA: Diagnosis not present

## 2017-04-27 LAB — POCT URINE PREGNANCY: Preg Test, Ur: NEGATIVE

## 2017-04-27 MED ORDER — AMLODIPINE BESYLATE 10 MG PO TABS: 10.0000 mg | ORAL_TABLET | Freq: Every day | ORAL | 6 refills | Status: DC

## 2017-04-27 MED ORDER — LEVONORGEST-ETH ESTRAD 91-DAY 0.15-0.03 MG PO TABS: 1.0000 | ORAL_TABLET | Freq: Every day | ORAL | 4 refills | Status: DC

## 2017-04-27 NOTE — Progress Notes (Signed)
Post Partum Exam  Sue [name] is a 42 y.o. [id number] female who presents for a postpartum visit. She is 4 weeks postpartum following a spontaneous vaginal delivery. I have fully reviewed the prenatal and intrapartum course. The delivery was at [redacted]w[redacted]d gestational weeks.  Anesthesia: epidural. Postpartum course has been unremarkable. Baby's course has been unremarkable. Baby is feeding by bottle - [name]. . Bleeding scant. Bowel function is normal. Bladder function is normal. Patient is not sexually active. Contraception method is none. Postpartum depression screening:neg. EPDS: 3  The following portions of the patient's history were reviewed and updated as appropriate: allergies, current medications, past family history, past medical history, past social history, past surgical history and problem list. Last pap smear done 11/2015 and was Normal  Review of Systems Pertinent items noted in HPI and remainder of comprehensive ROS otherwise negative.    Objective:  Blood pressure (!) 141/91, pulse 68, weight 161 lb 6.4 oz (73.2 kg), unknown if currently breastfeeding.  General:  alert, cooperative and no distress   Breasts:  inspection negative, no nipple discharge or bleeding, no masses or nodularity palpable  Lungs: clear to auscultation bilaterally  Heart:  regular rate and rhythm, S1, S2 normal, no murmur, click, rub or gallop  Abdomen: soft, non-tender; bowel sounds normal; no masses,  no organomegaly   Vulva:  normal  Vagina: normal vagina, no discharge, exudate, lesion, or erythema  Cervix:  no bleeding following Pap  Corpus: normal size, contour, position, consistency, mobility, non-tender  Adnexa:  normal adnexa  Rectal Exam: Not performed.        Assessment:    Normal 4 week postpartum exam. Pap smear done at today's visit.   Chronic hypertension   Plan:   1. Contraception: abstinence and OCP (estrogen/progesterone) 2. OCP's to start in 2 weeks.  ACHES reviewed.  3. Follow up  in: 1 year or as needed.

## 2017-04-29 LAB — CYTOLOGY - PAP
Diagnosis: NEGATIVE
HPV: NOT DETECTED

## 2017-05-14 ENCOUNTER — Encounter: Payer: Self-pay | Admitting: Endocrinology

## 2017-05-14 ENCOUNTER — Ambulatory Visit: Payer: Medicaid Other | Admitting: Endocrinology

## 2017-05-14 DIAGNOSIS — E0590 Thyrotoxicosis, unspecified without thyrotoxic crisis or storm: Secondary | ICD-10-CM | POA: Insufficient documentation

## 2017-05-14 LAB — TSH: TSH: 0.6 u[IU]/mL (ref 0.35–4.50)

## 2017-05-14 LAB — T4, FREE: Free T4: 0.78 ng/dL (ref 0.60–1.60)

## 2017-05-14 NOTE — Progress Notes (Signed)
Subjective:    Patient ID: Sue [name], female    DOB: 1976/01/29, 42 y.o.   MRN: [id number]  HPI Pt returns for f/u of hyperthyroidism (dx'ed 2010; she has never had thyroid imaging, but PE shows nodules; fact that hyperthyroidism is mild she is rx'ed with tapazole, due to poor wt gain).  she is 2 mos postpartum.  pt states she feels well in general.  She is not breast-feeding Past Medical History:  Diagnosis Date  . Asthma    mild by PFT's 03/16/1998  . Cervical lymphadenopathy    biopsy negativeMarvetta Gibbons)  . Chronic hypertension   . Complete spontaneous abortion    x2  . GERD (gastroesophageal reflux disease)   . History of abnormal Pap smear    s/p cryo( Dr. Wiliam Ke)  . HSV (herpes simplex virus) anogenital infection   . Hypertension   . Kidney stone   . Normal vaginal delivery    x3  . Rheumatoid arthritis (HCC)   . Thyroid disease affecting pregnancy   . 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   . Vaginal Pap smear, abnormal     Past Surgical History:  Procedure Laterality Date  . biopsy of lymph node in R neck  2007.  Marland Kitchen LEEP      Social History   Socioeconomic History  . Marital status: Single    Spouse name: Not on file  . Number of children: Not on file  . Years of education: Not on file  . Highest education level: Not on file  Social Needs  . Financial resource strain: Not on file  . Food insecurity - worry: Not on file  . Food insecurity - inability: Not on file  . Transportation needs - medical: Not on file  . Transportation needs - non-medical: Not on file  Occupational History  . Not on file  Tobacco Use  . Smoking status: Never Smoker  . Smokeless tobacco: Never Used  Substance and Sexual Activity  . Alcohol use: No    Alcohol/week: 0.0 oz    Comment: occasional beer and liquor on weekends  .  Drug use: No  . Sexual activity: Not Currently    Birth control/protection: None  Other Topics Concern  . Not on file  Social History Narrative  . Not on file    Current Outpatient Medications on File Prior to Visit  Medication Sig Dispense Refill  . acetaminophen (TYLENOL) 500 MG tablet Take 1,000 mg by mouth every 6 (six) hours as needed for mild pain, moderate pain or headache.    . Adalimumab (HUMIRA [place]) Inject into the skin.    Marland Kitchen albuterol (PROVENTIL HFA;VENTOLIN HFA) 108 (90 Base) MCG/ACT inhaler Inhale 1-2 puffs into the lungs every 6 (six) hours as needed for wheezing or shortness of breath. 1 Inhaler 0  . amLODipine (NORVASC) 10 MG tablet Take 1 tablet (10 mg total) by mouth daily. 30 tablet 6  . calcium carbonate (TUMS - DOSED IN MG ELEMENTAL CALCIUM) 500 MG chewable tablet Chew 1 tablet by mouth daily as needed for heartburn.     . cetirizine (ZYRTEC) 10 MG tablet Take 10 mg by mouth daily.    . Chlorpheniramine-DM (CORICIDIN HBP COUGH/COLD PO) Take 1 tablet by mouth daily as needed (for cough).     Marland Kitchen ibuprofen (ADVIL,MOTRIN) 600 MG tablet Take 1 tablet (600 mg total)  by mouth every 6 (six) hours. (Patient not taking: Reported on 04/27/2017) 30 tablet 0  . levonorgestrel-ethinyl estradiol (SEASONALE,INTROVALE,JOLESSA) 0.15-0.03 MG tablet Take 1 tablet by mouth daily. 1 Package 4  . mesalamine (CANASA) 1000 MG suppository Place 1,000 mg rectally as needed (Ulcerative Proctitis).     Marland Kitchen oxyCODONE-acetaminophen (PERCOCET/ROXICET) 5-325 MG tablet Take 1 tablet by mouth every 4 (four) hours as needed for severe pain. (Patient not taking: Reported on 04/27/2017) 20 tablet 0  . pantoprazole (PROTONIX) 40 MG tablet Take 40 mg by mouth daily.  1  . Prenatal Vit w/Fe-Methylfol-FA (PNV PO) Take by mouth.    . valACYclovir (VALTREX) 500 MG tablet Take 1 tablet (500 mg total) by mouth daily. (Patient not taking: Reported on 04/27/2017) 30 tablet 1   No current facility-administered medications on  file prior to visit.     Allergies  Allergen Reactions  . Ciprofloxacin Anaphylaxis and Swelling    Throat swells  . Clindamycin/Lincomycin Anaphylaxis and Swelling  . Sulfamethoxazole-Trimethoprim Swelling    Swelling is of the lips. Swelling is of the lips.  . Amoxicillin Rash    Has patient had a PCN reaction causing immediate rash, facial/tongue/throat swelling, SOB or lightheadedness with hypotension: no Has patient had a PCN reaction causing severe rash involving mucus membranes or skin necrosis: pt had rash -- non severe  Has patient had a PCN reaction that required hospitalization: no Has patient had a PCN reaction occurring within the last 10 years: no If all of the above answers are "NO", then may proceed with Cephalosporin use.     Family History  Problem Relation Age of Onset  . Ulcerative colitis Mother   . Hypertension Mother   . Diabetes Father   . Heart attack Father   . Hypertension Father   . Diabetes Sister   . Thyroid disease Sister   . Heart attack Maternal Grandmother   . Stroke Maternal Grandmother     BP 108/66 (BP Location: Left Arm, Patient Position: Sitting, Cuff Size: Normal)   Pulse 72   Wt 167 lb (75.8 kg)   SpO2 98%   BMI 28.67 kg/m   Review of Systems Denies fever    Objective:   Physical Exam VITAL SIGNS:  See vs page GENERAL: no distress NECK: There is no palpable thyroid enlargement.  No thyroid nodule is palpable.  No palpable lymphadenopathy at the anterior neck.        Assessment & Plan:  Hyperthyroidism: due for recheck.  Patient Instructions  blood tests are requested for you today.  We'll let you know about the results.  Please come back for a follow-up appointment in 4 months.

## 2017-05-14 NOTE — Patient Instructions (Signed)
blood tests are requested for you today.  We'll let you know about the results. Please come back for a follow-up appointment in 4 months.     

## 2017-06-28 ENCOUNTER — Ambulatory Visit (INDEPENDENT_AMBULATORY_CARE_PROVIDER_SITE_OTHER): Payer: Medicaid Other | Admitting: Certified Nurse Midwife

## 2017-06-28 ENCOUNTER — Encounter: Payer: Self-pay | Admitting: Certified Nurse Midwife

## 2017-06-28 VITALS — BP 127/87 | HR 85 | Ht 64.0 in | Wt 170.4 lb

## 2017-06-28 DIAGNOSIS — N939 Abnormal uterine and vaginal bleeding, unspecified: Secondary | ICD-10-CM

## 2017-06-28 DIAGNOSIS — Z30013 Encounter for initial prescription of injectable contraceptive: Secondary | ICD-10-CM

## 2017-06-28 MED ORDER — MEDROXYPROGESTERONE ACETATE 150 MG/ML IM SUSP: 150.0000 mg | INTRAMUSCULAR | 4 refills | Status: DC

## 2017-06-28 NOTE — Progress Notes (Signed)
Patient stated she has been on [facility] pills for about 2 months and started bleeding and cramping 05-31-17 and has not stopped.

## 2017-06-28 NOTE — Progress Notes (Signed)
Patient ID: Sue [name], female   DOB: October 25, 1975, 42 y.o.   MRN: [id number]  Chief Complaint  Patient presents with  . GYN    HPI Sue [name] is a 42 y.o. female.  Here for f/u on OCPs.  Had taken [facility] for 12 years in the past.  Had been on Depo injections in the past and did well until she started loosing her hair.  Reports consistent bleeding since starting OCPs, soaking a super plus pad in 3 hours.  Reports small dime sized clots and severe dysmenorrhea.  Is not currently sexually active.  Contraception methods discussed.  Not a candidate for OCPs d/t hx of HTN.  Is taking vitamins daily.  Spouse is planning vasectomy this summer.    Reviewed all forms of birth control options available including abstinence; fertility period awareness methods; over the counter/barrier methods; hormonal contraceptive medication including pill, patch, ring, injection,contraceptive implant; hormonal and nonhormonal IUDs; permanent sterilization options including vasectomy and the various tubal sterilization modalities. Risks and benefits reviewed.  Questions were answered.  Information was given to patient to review.   HPI  Past Medical History:  Diagnosis Date  . Asthma    mild by PFT's 03/16/1998  . Cervical lymphadenopathy    biopsy negativeMarvetta [name])  . Chronic hypertension   . Complete spontaneous abortion    x2  . GERD (gastroesophageal reflux disease)   . History of abnormal Pap smear    s/p cryo( Dr. Wiliam Ke)  . HSV (herpes simplex virus) anogenital infection   . Hypertension   . Kidney stone   . Normal vaginal delivery    x3  . Rheumatoid arthritis (HCC)   . Thyroid disease affecting pregnancy   . 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   . Vaginal Pap smear, abnormal     Past Surgical History:  Procedure Laterality Date  . biopsy of  lymph node in R neck  2007.  Marland Kitchen LEEP      Family History  Problem Relation Age of Onset  . Ulcerative colitis Mother   . Hypertension Mother   . Diabetes Father   . Heart attack Father   . Hypertension Father   . Diabetes Sister   . Thyroid disease Sister   . Heart attack Maternal Grandmother   . Stroke Maternal Grandmother     Social History Social History   Tobacco Use  . Smoking status: Never Smoker  . Smokeless tobacco: Never Used  Substance Use Topics  . Alcohol use: No    Alcohol/week: 0.0 oz    Comment: occasional beer and liquor on weekends  . Drug use: No    Allergies  Allergen Reactions  . Ciprofloxacin Anaphylaxis and Swelling    Throat swells  . Clindamycin/Lincomycin Anaphylaxis and Swelling  . Sulfamethoxazole-Trimethoprim Swelling    Swelling is of the lips. Swelling is of the lips.  . Amoxicillin Rash    Has patient had a PCN reaction causing immediate rash, facial/tongue/throat swelling, SOB or lightheadedness with hypotension: no Has patient had a PCN reaction causing severe rash involving mucus membranes or skin necrosis: pt had rash -- non severe  Has patient had a PCN reaction that required hospitalization: no Has patient had a PCN reaction occurring within the last 10 years: no If all of the above answers are "NO", then may proceed with Cephalosporin use.  Current Outpatient Medications  Medication Sig Dispense Refill  . Adalimumab (HUMIRA [place]) Inject into the skin.    Marland Kitchen albuterol (PROVENTIL HFA;VENTOLIN HFA) 108 (90 Base) MCG/ACT inhaler Inhale 1-2 puffs into the lungs every 6 (six) hours as needed for wheezing or shortness of breath. 1 Inhaler 0  . amLODipine (NORVASC) 10 MG tablet Take 1 tablet (10 mg total) by mouth daily. 30 tablet 6  . levonorgestrel-ethinyl estradiol (SEASONALE,INTROVALE,JOLESSA) 0.15-0.03 MG tablet Take 1 tablet by mouth daily. 1 Package 4  . acetaminophen (TYLENOL) 500 MG tablet Take 1,000 mg by mouth every 6 (six)  hours as needed for mild pain, moderate pain or headache.    . calcium carbonate (TUMS - DOSED IN MG ELEMENTAL CALCIUM) 500 MG chewable tablet Chew 1 tablet by mouth daily as needed for heartburn.     . cetirizine (ZYRTEC) 10 MG tablet Take 10 mg by mouth daily.    . Chlorpheniramine-DM (CORICIDIN HBP COUGH/COLD PO) Take 1 tablet by mouth daily as needed (for cough).     . medroxyPROGESTERone (DEPO-PROVERA) 150 MG/ML injection Inject 1 mL (150 mg total) into the muscle every 3 (three) months. 1 mL 4  . mesalamine (CANASA) 1000 MG suppository Place 1,000 mg rectally as needed (Ulcerative Proctitis).     . pantoprazole (PROTONIX) 40 MG tablet Take 40 mg by mouth daily.  1  . Prenatal Vit w/Fe-Methylfol-FA (PNV PO) Take by mouth.    . valACYclovir (VALTREX) 500 MG tablet Take 1 tablet (500 mg total) by mouth daily. (Patient not taking: Reported on 04/27/2017) 30 tablet 1   No current facility-administered medications for this visit.     Review of Systems Review of Systems Constitutional: negative for fatigue and weight loss Respiratory: negative for cough and wheezing Cardiovascular: negative for chest pain, fatigue and palpitations Gastrointestinal: negative for abdominal pain and change in bowel habits Genitourinary:negative Integument/breast: negative for nipple discharge Musculoskeletal:negative for myalgias Neurological: negative for gait problems and tremors Behavioral/Psych: negative for abusive relationship, depression Endocrine: negative for temperature intolerance      Blood pressure 127/87, pulse 85, height 5\' 4"  (1.626 m), weight 170 lb 6.4 oz (77.3 kg), unknown if currently breastfeeding.  Physical Exam Physical Exam General:   alert  PE deferred  100% of 15 min visit spent on counseling and coordination of care.   Data Reviewed Previous medical hx, meds, labs  Assessment     1. Abnormal uterine bleeding (AUB)    - CBC  2. Encounter for initial prescription of  injectable contraceptive     - medroxyPROGESTERone (DEPO-PROVERA) 150 MG/ML injection; Inject 1 mL (150 mg total) into the muscle every 3 (three) months.  Dispense: 1 mL; Refill: 4     Plan    Orders Placed This Encounter  Procedures  . CBC   Meds ordered this encounter  Medications  . medroxyPROGESTERone (DEPO-PROVERA) 150 MG/ML injection    Sig: Inject 1 mL (150 mg total) into the muscle every 3 (three) months.    Dispense:  1 mL    Refill:  4   Possible management options include:  Mirena IUD Follow up as needed.

## 2017-06-29 LAB — CBC
Hematocrit: 41.2 % (ref 34.0–46.6)
Hemoglobin: 13.8 g/dL (ref 11.1–15.9)
MCH: 29.8 pg (ref 26.6–33.0)
MCHC: 33.5 g/dL (ref 31.5–35.7)
MCV: 89 fL (ref 79–97)
Platelets: 371 10*3/uL (ref 150–379)
RBC: 4.63 x10E6/uL (ref 3.77–5.28)
RDW: 15 % (ref 12.3–15.4)
WBC: 10.1 10*3/uL (ref 3.4–10.8)

## 2017-06-30 ENCOUNTER — Ambulatory Visit (INDEPENDENT_AMBULATORY_CARE_PROVIDER_SITE_OTHER): Payer: Medicaid Other

## 2017-06-30 VITALS — BP 136/86 | HR 85

## 2017-06-30 DIAGNOSIS — Z3042 Encounter for surveillance of injectable contraceptive: Secondary | ICD-10-CM

## 2017-06-30 MED ORDER — MEDROXYPROGESTERONE ACETATE 150 MG/ML IM SUSP
150.0000 mg | Freq: Once | INTRAMUSCULAR | Status: AC
  Administered 2017-06-30: 150 mg via INTRAMUSCULAR

## 2017-06-30 NOTE — Progress Notes (Addendum)
Pt here for depo shot. Inj given in left deltoid. Pt tolerated well. Next depo due 7/10-7/24.  Attestation of Attending Supervision of CMA/RN: Evaluation and management procedures were performed by the nurse under my supervision and collaboration.  I have reviewed the nursing note and chart, and I agree with the management and plan.  Carolyn L. Harraway-Smith, M.D., [place]

## 2017-07-01 ENCOUNTER — Encounter (HOSPITAL_COMMUNITY): Payer: Self-pay | Admitting: *Deleted

## 2017-07-01 ENCOUNTER — Inpatient Hospital Stay (HOSPITAL_COMMUNITY)
Admission: AD | Admit: 2017-07-01 | Discharge: 2017-07-01 | Disposition: A | Discharge status: Home / Self Care | Payer: Medicaid Other | Source: Ambulatory Visit | Attending: Obstetrics & Gynecology | Admitting: Obstetrics & Gynecology

## 2017-07-01 DIAGNOSIS — N939 Abnormal uterine and vaginal bleeding, unspecified: Secondary | ICD-10-CM

## 2017-07-01 DIAGNOSIS — Z87442 Personal history of urinary calculi: Secondary | ICD-10-CM | POA: Insufficient documentation

## 2017-07-01 DIAGNOSIS — Z79899 Other long term (current) drug therapy: Secondary | ICD-10-CM | POA: Diagnosis not present

## 2017-07-01 DIAGNOSIS — I10 Essential (primary) hypertension: Secondary | ICD-10-CM | POA: Diagnosis not present

## 2017-07-01 DIAGNOSIS — J45909 Unspecified asthma, uncomplicated: Secondary | ICD-10-CM | POA: Insufficient documentation

## 2017-07-01 DIAGNOSIS — M069 Rheumatoid arthritis, unspecified: Secondary | ICD-10-CM | POA: Insufficient documentation

## 2017-07-01 DIAGNOSIS — K219 Gastro-esophageal reflux disease without esophagitis: Secondary | ICD-10-CM | POA: Diagnosis not present

## 2017-07-01 LAB — URINALYSIS, ROUTINE W REFLEX MICROSCOPIC
Bilirubin Urine: NEGATIVE
Glucose, UA: NEGATIVE mg/dL
Ketones, ur: NEGATIVE mg/dL
Leukocytes, UA: NEGATIVE
Nitrite: NEGATIVE
Protein, ur: NEGATIVE mg/dL
RBC / HPF: >50 RBC/hpf — ABNORMAL HIGH (ref 0–5)
Specific Gravity, Urine: 1.013 (ref 1.005–1.030)
pH: 6 (ref 5.0–8.0)

## 2017-07-01 LAB — WET PREP, GENITAL
Clue Cells Wet Prep HPF POC: NONE SEEN
Sperm: NONE SEEN
Trich, Wet Prep: NONE SEEN
Yeast Wet Prep HPF POC: NONE SEEN

## 2017-07-01 LAB — POCT PREGNANCY, URINE: Preg Test, Ur: NEGATIVE

## 2017-07-01 MED ORDER — TRAMADOL HCL 50 MG PO TABS: 50.0000 mg | ORAL_TABLET | Freq: Four times a day (QID) | ORAL | 0 refills | Status: DC | PRN

## 2017-07-01 NOTE — Discharge Instructions (Signed)
Abnormal Uterine Bleeding Abnormal uterine bleeding can affect women at various stages in life, including teenagers, women in their reproductive years, pregnant women, and women who have reached menopause. Several kinds of uterine bleeding are considered abnormal, including:  Bleeding or spotting between periods.  Bleeding after sexual intercourse.  Bleeding that is heavier or more than normal.  Periods that last longer than usual.  Bleeding after menopause. Many cases of abnormal uterine bleeding are minor and simple to treat, while others are more serious. Any type of abnormal bleeding should be evaluated by your health care provider. Treatment will depend on the cause of the bleeding. Follow these instructions at home: Monitor your condition for any changes. The following actions may help to alleviate any discomfort you are experiencing:  Avoid the use of tampons and douches as directed by your health care provider.  Change your pads frequently. You should get regular pelvic exams and Pap tests. Keep all follow-up appointments for diagnostic tests as directed by your health care provider. Contact a health care provider if:  Your bleeding lasts more than 1 week.  You feel dizzy at times. Get help right away if:  You pass out.  You are changing pads every 15 to 30 minutes.  You have abdominal pain.  You have a fever.  You become sweaty or weak.  You are passing large blood clots from the vagina.  You start to feel nauseous and vomit. This information is not intended to replace advice given to you by your health care provider. Make sure you discuss any questions you have with your health care provider. Document Released: 02/23/2005 Document Revised: 08/07/2015 Document Reviewed: 09/22/2012 Elsevier Interactive Patient Education  2017 Elsevier Inc.  

## 2017-07-01 NOTE — MAU Note (Signed)
Pt reports she has been bleeding for over a month. Went to [facility]. Told to stop taking birth control pills and then she was given a depo shot.  Feels like she should have been examined. She is changing a pad evey 3-4 hours and passing clots. C/o lower abd pain and cramping.

## 2017-07-01 NOTE — MAU Provider Note (Signed)
History     CSN: [id number]  Arrival date and time: 07/01/17 3545   First Provider Initiated Contact with Patient 07/01/17 [phone]      Chief Complaint  Patient presents with  . Vaginal Bleeding   HPI Sue [name] is a 42 y.o. [id number] non pregnant female who presents with vaginal bleeding. She states she has been bleeding for a month and is frustrated because it's not getting better. She was seen in the office Monday for the same complaint and had blood work done that was normal. She got a Depo shot yesterday for bleeding. She states she is still saturating a pad every 3 hours and have lower abdominal cramping. She tried tylenol with minimal relief.   OB History    Gravida  7   Para  4   Term  3   Preterm  1   AB  3   Living  4     SAB  3   TAB      Ectopic      Multiple  0   Live Births  4           Past Medical History:  Diagnosis Date  . Asthma    mild by PFT's 03/16/1998  . Cervical lymphadenopathy    biopsy negativeMarvetta Gibbons)  . Chronic hypertension   . Complete spontaneous abortion    x2  . GERD (gastroesophageal reflux disease)   . History of abnormal Pap smear    s/p cryo( Dr. Wiliam Ke)  . HSV (herpes simplex virus) anogenital infection   . Hypertension   . Kidney stone   . Normal vaginal delivery    x3  . Rheumatoid arthritis (HCC)   . Thyroid disease affecting pregnancy   . 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   . Vaginal Pap smear, abnormal     Past Surgical History:  Procedure Laterality Date  . biopsy of lymph node in R neck  2007.  Marland Kitchen LEEP      Family History  Problem Relation Age of Onset  . Ulcerative colitis Mother   . Hypertension Mother   . Diabetes Father   . Heart attack Father   . Hypertension Father   . Diabetes Sister   . Thyroid disease Sister   . Heart attack  Maternal Grandmother   . Stroke Maternal Grandmother     Social History   Tobacco Use  . Smoking status: Never Smoker  . Smokeless tobacco: Never Used  Substance Use Topics  . Alcohol use: No    Alcohol/week: 0.0 oz    Comment: occasional beer and liquor on weekends  . Drug use: No    Allergies:  Allergies  Allergen Reactions  . Ciprofloxacin Anaphylaxis and Swelling    Throat swells  . Clindamycin/Lincomycin Anaphylaxis and Swelling  . Sulfamethoxazole-Trimethoprim Swelling    Swelling is of the lips. Swelling is of the lips.  . Amoxicillin Rash    Has patient had a PCN reaction causing immediate rash, facial/tongue/throat swelling, SOB or lightheadedness with hypotension: no Has patient had a PCN reaction causing severe rash involving mucus membranes or skin necrosis: pt had rash -- non severe  Has patient had a PCN reaction that required hospitalization: no Has patient had a PCN reaction occurring within the last 10 years: no If all of the above answers are "NO", then may  proceed with Cephalosporin use.     Medications Prior to Admission  Medication Sig Dispense Refill Last Dose  . acetaminophen (TYLENOL) 500 MG tablet Take 1,000 mg by mouth every 6 (six) hours as needed for mild pain, moderate pain or headache.   Not Taking  . Adalimumab (HUMIRA [place]) Inject into the skin.   Taking  . albuterol (PROVENTIL HFA;VENTOLIN HFA) 108 (90 Base) MCG/ACT inhaler Inhale 1-2 puffs into the lungs every 6 (six) hours as needed for wheezing or shortness of breath. 1 Inhaler 0 Taking  . amLODipine (NORVASC) 10 MG tablet Take 1 tablet (10 mg total) by mouth daily. 30 tablet 6 Taking  . calcium carbonate (TUMS - DOSED IN MG ELEMENTAL CALCIUM) 500 MG chewable tablet Chew 1 tablet by mouth daily as needed for heartburn.    Not Taking  . cetirizine (ZYRTEC) 10 MG tablet Take 10 mg by mouth daily.   Not Taking  . Chlorpheniramine-DM (CORICIDIN HBP COUGH/COLD PO) Take 1 tablet by mouth daily as  needed (for cough).    Not Taking  . levonorgestrel-ethinyl estradiol (SEASONALE,INTROVALE,JOLESSA) 0.15-0.03 MG tablet Take 1 tablet by mouth daily. 1 Package 4 Taking  . medroxyPROGESTERone (DEPO-PROVERA) 150 MG/ML injection Inject 1 mL (150 mg total) into the muscle every 3 (three) months. 1 mL 4   . mesalamine (CANASA) 1000 MG suppository Place 1,000 mg rectally as needed (Ulcerative Proctitis).    Not Taking  . pantoprazole (PROTONIX) 40 MG tablet Take 40 mg by mouth daily.  1 Not Taking  . Prenatal Vit w/Fe-Methylfol-FA (PNV PO) Take by mouth.   Not Taking  . valACYclovir (VALTREX) 500 MG tablet Take 1 tablet (500 mg total) by mouth daily. (Patient not taking: Reported on 04/27/2017) 30 tablet 1 Not Taking    Review of Systems  Constitutional: Negative.  Negative for fatigue and fever.  HENT: Negative.   Respiratory: Negative.  Negative for shortness of breath.   Cardiovascular: Negative.  Negative for chest pain.  Gastrointestinal: Positive for abdominal pain. Negative for constipation, diarrhea, nausea and vomiting.  Genitourinary: Positive for vaginal bleeding. Negative for dysuria.  Neurological: Negative.  Negative for dizziness and headaches.   Physical Exam   Blood pressure 123/85, pulse 80, temperature 98.8 F (37.1 C), temperature source Oral, resp. rate 18, height 5\' 4"  (1.626 m), weight 171 lb (77.6 kg), last menstrual period 05/31/2017, unknown if currently breastfeeding.  Physical Exam  Nursing note and vitals reviewed. Constitutional: She is oriented to person, place, and time. She appears well-developed and well-nourished. No distress.  HENT:  Head: Normocephalic.  Eyes: Pupils are equal, round, and reactive to light.  Cardiovascular: Normal rate, regular rhythm and normal heart sounds.  Respiratory: Effort normal and breath sounds normal. No respiratory distress.  GI: Soft. Bowel sounds are normal. She exhibits no distension. There is no tenderness.   Genitourinary:  Genitourinary Comments: Small amount of dark red vaginal bleeding in vault.   Neurological: She is alert and oriented to person, place, and time.  Skin: Skin is warm and dry.  Psychiatric: She has a normal mood and affect. Her behavior is normal. Judgment and thought content normal.    MAU Course  Procedures Results for orders placed or performed during the hospital encounter of 07/01/17 (from the past 24 hour(s))  Urinalysis, Routine w reflex microscopic     Status: Abnormal   Collection Time: 07/01/17  8:43 AM  Result Value Ref Range   Color, Urine YELLOW YELLOW   APPearance CLEAR CLEAR  Specific Gravity, Urine 1.013 1.005 - 1.030   pH 6.0 5.0 - 8.0   Glucose, UA NEGATIVE NEGATIVE mg/dL   Hgb urine dipstick LARGE (A) NEGATIVE   Bilirubin Urine NEGATIVE NEGATIVE   Ketones, ur NEGATIVE NEGATIVE mg/dL   Protein, ur NEGATIVE NEGATIVE mg/dL   Nitrite NEGATIVE NEGATIVE   Leukocytes, UA NEGATIVE NEGATIVE   RBC / HPF >50 (H) 0 - 5 RBC/hpf   WBC, UA 6-10 0 - 5 WBC/hpf   Bacteria, UA RARE (A) NONE SEEN   Squamous Epithelial / LPF 0-5 0 - 5   Mucus PRESENT   Pregnancy, urine POC     Status: None   Collection Time: 07/01/17  9:28 AM  Result Value Ref Range   Preg Test, Ur NEGATIVE NEGATIVE  Wet prep, genital     Status: Abnormal   Collection Time: 07/01/17  9:57 AM  Result Value Ref Range   Yeast Wet Prep HPF POC NONE SEEN NONE SEEN   Trich, Wet Prep NONE SEEN NONE SEEN   Clue Cells Wet Prep HPF POC NONE SEEN NONE SEEN   WBC, Wet Prep HPF POC MANY (A) NONE SEEN   Sperm NONE SEEN    MDM UA, UPT Wet prep and gc/chlamydia Patient driving, unable to give pain medication here. Patient reports allergy to ibuprofen products.  Assessment and Plan   1. Abnormal uterine bleeding    -Discharge home in stable condition -Rx for limited number of ultram for break through pain. Encouraged patient to continue to use tylenol. -Vaginal bleeding precautions  discussed -Patient advised to follow-up with [facility] for gyn concerns.  -Patient may return to MAU as needed or if her condition were to change or worsen  Rolm Bookbinder CNM 07/01/2017, 9:05 AM

## 2017-07-02 LAB — GC/CHLAMYDIA PROBE AMP (~~LOC~~) NOT AT ARMC
Chlamydia: NEGATIVE
Neisseria Gonorrhea: NEGATIVE

## 2017-07-12 ENCOUNTER — Other Ambulatory Visit: Payer: Self-pay | Admitting: Obstetrics

## 2017-07-12 ENCOUNTER — Other Ambulatory Visit: Payer: Self-pay

## 2017-07-12 DIAGNOSIS — R059 Cough, unspecified: Secondary | ICD-10-CM

## 2017-07-12 DIAGNOSIS — N750 Cyst of Bartholin's gland: Secondary | ICD-10-CM

## 2017-07-12 DIAGNOSIS — R05 Cough: Secondary | ICD-10-CM

## 2017-07-12 MED ORDER — DOXYCYCLINE HYCLATE 100 MG PO CAPS: 100.0000 mg | ORAL_CAPSULE | Freq: Two times a day (BID) | ORAL | 2 refills | Status: DC

## 2017-07-12 MED ORDER — METRONIDAZOLE 500 MG PO TABS: 500.0000 mg | ORAL_TABLET | Freq: Two times a day (BID) | ORAL | 0 refills | Status: DC

## 2017-07-12 NOTE — Progress Notes (Signed)
Doxy / Flagyl Rx for Bartholin Cyst

## 2017-07-13 ENCOUNTER — Telehealth: Payer: Self-pay

## 2017-07-13 NOTE — Telephone Encounter (Signed)
Contacted pt and advised of rx sent by provider. 

## 2017-07-15 ENCOUNTER — Ambulatory Visit (HOSPITAL_COMMUNITY): Payer: Medicaid Other

## 2017-09-21 ENCOUNTER — Ambulatory Visit: Payer: Medicaid Other | Admitting: Endocrinology

## 2017-09-22 ENCOUNTER — Ambulatory Visit (INDEPENDENT_AMBULATORY_CARE_PROVIDER_SITE_OTHER): Payer: Medicaid Other

## 2017-09-22 DIAGNOSIS — Z3042 Encounter for surveillance of injectable contraceptive: Secondary | ICD-10-CM | POA: Diagnosis not present

## 2017-09-22 MED ORDER — MEDROXYPROGESTERONE ACETATE 150 MG/ML IM SUSP
150.0000 mg | Freq: Once | INTRAMUSCULAR | Status: AC
  Administered 2017-09-22: 150 mg via INTRAMUSCULAR

## 2017-09-22 NOTE — Progress Notes (Signed)
I have reviewed the chart and agree with nursing staff's documentation of this patient's encounter.  Jaynie Collins, MD 09/22/2017 5:07 PM

## 2017-09-22 NOTE — Progress Notes (Signed)
Nurse visit for pt supplied Depo. Pt is on time for inj. Depo given L Del w/o difficulty. Next Depo due 12/14/17.

## 2017-11-17 ENCOUNTER — Telehealth: Payer: Self-pay

## 2017-11-17 ENCOUNTER — Other Ambulatory Visit: Payer: Self-pay | Admitting: Obstetrics

## 2017-11-17 DIAGNOSIS — N939 Abnormal uterine and vaginal bleeding, unspecified: Secondary | ICD-10-CM

## 2017-11-17 MED ORDER — NORETHINDRONE-ETH ESTRADIOL 1-35 MG-MCG PO TABS: 1.0000 | ORAL_TABLET | Freq: Every day | ORAL | 1 refills | Status: DC

## 2017-11-17 NOTE — Telephone Encounter (Signed)
[facility] 1/35 Rx for AUB on Depo.

## 2017-11-17 NOTE — Telephone Encounter (Signed)
Pt called and left message stating that she is having irregular bleeding with her depo shot since 11/01/17. Attempted to call pt. No answer. Left message for pt to return call to office to discuss

## 2017-11-17 NOTE — Telephone Encounter (Signed)
Pt called stating that she is having abnormal bleeding with her depo. She states that she has been bleeding and having clots since 11/01/17 with abdominal pain. Pt is requesting something to help control her bleeding.

## 2017-11-18 NOTE — Telephone Encounter (Signed)
Pt informed

## 2017-12-08 DIAGNOSIS — K529 Noninfective gastroenteritis and colitis, unspecified: Secondary | ICD-10-CM | POA: Diagnosis not present

## 2017-12-08 DIAGNOSIS — H209 Unspecified iridocyclitis: Secondary | ICD-10-CM | POA: Diagnosis not present

## 2017-12-08 DIAGNOSIS — M129 Arthropathy, unspecified: Secondary | ICD-10-CM | POA: Diagnosis not present

## 2017-12-08 DIAGNOSIS — M0760 Enteropathic arthropathies, unspecified site: Secondary | ICD-10-CM | POA: Diagnosis not present

## 2017-12-08 DIAGNOSIS — H20013 Primary iridocyclitis, bilateral: Secondary | ICD-10-CM | POA: Diagnosis not present

## 2017-12-08 DIAGNOSIS — Z79899 Other long term (current) drug therapy: Secondary | ICD-10-CM | POA: Diagnosis not present

## 2017-12-08 DIAGNOSIS — K5120 Ulcerative (chronic) proctitis without complications: Secondary | ICD-10-CM | POA: Diagnosis not present

## 2017-12-14 ENCOUNTER — Ambulatory Visit: Payer: Medicaid Other

## 2017-12-15 DIAGNOSIS — H20013 Primary iridocyclitis, bilateral: Secondary | ICD-10-CM | POA: Diagnosis not present

## 2018-01-07 ENCOUNTER — Other Ambulatory Visit: Payer: Self-pay | Admitting: Internal Medicine

## 2018-01-07 ENCOUNTER — Other Ambulatory Visit: Payer: Self-pay

## 2018-01-07 ENCOUNTER — Ambulatory Visit: Payer: Medicaid Other | Admitting: Internal Medicine

## 2018-01-07 ENCOUNTER — Encounter: Payer: Self-pay | Admitting: Internal Medicine

## 2018-01-07 DIAGNOSIS — K219 Gastro-esophageal reflux disease without esophagitis: Secondary | ICD-10-CM

## 2018-01-07 DIAGNOSIS — R059 Cough, unspecified: Secondary | ICD-10-CM | POA: Insufficient documentation

## 2018-01-07 DIAGNOSIS — J302 Other seasonal allergic rhinitis: Secondary | ICD-10-CM | POA: Diagnosis not present

## 2018-01-07 DIAGNOSIS — H9201 Otalgia, right ear: Secondary | ICD-10-CM | POA: Diagnosis not present

## 2018-01-07 DIAGNOSIS — R05 Cough: Secondary | ICD-10-CM

## 2018-01-07 DIAGNOSIS — Z79899 Other long term (current) drug therapy: Secondary | ICD-10-CM

## 2018-01-07 HISTORY — DX: Cough, unspecified: R05.9

## 2018-01-07 MED ORDER — PANTOPRAZOLE SODIUM 40 MG PO TBEC: 40.0000 mg | DELAYED_RELEASE_TABLET | Freq: Every day | ORAL | 2 refills | Status: DC

## 2018-01-07 MED ORDER — LEVOCETIRIZINE DIHYDROCHLORIDE 5 MG PO TABS: 5.0000 mg | ORAL_TABLET | Freq: Every evening | ORAL | 1 refills | Status: DC

## 2018-01-07 MED ORDER — FLUTICASONE PROPIONATE 50 MCG/ACT NA SUSP: 2.0000 | Freq: Every day | NASAL | 1 refills | Status: DC

## 2018-01-07 NOTE — Assessment & Plan Note (Addendum)
HPI: [name] reports a one-month history of cough that is productive of a thick white sputum without hemoptysis. She also endorses sinus drainage and right ear pain without discharge. She reports that the cough worsens when she lies down and when she gets warm. She has been taking OTC coricidin for cough with no relief. She has also been taking zyrtec for the past month, although this makes her feel drowsy so she only takes it at night. She denies fevers, chills, wheezing, rhinorrhea, or sore throat. She has a history of mild asthma for which she uses albuterol PRN. She usually never uses the albuterol, but has used it once daily for the past 2 weeks. She feels that the albuterol helps her cough up more sputum, but otherwise does not help. She reports mild chest pain and shortness of breath during and after coughing fits. She has a history of seasonal allergies that are exacerbated anytime the season changes. She also has a history of GERD. She used to take Protonix, but stopped this a few months ago because her prescription ran out and she didn't think it was helping her reflux symptoms much. She reports that her reflux has been under good control.  Assessment: On physical exam, the patient is afebrile and without sinus tenderness, cervical adenopathy, and wheezing. Her symptoms are most likely a combination of sinus congestion in the setting of seasonal allergies as well as GERD given the positional nature of her cough. Low suspicion of sinus infection or asthma exacerbation. Will resume protonix, switch to a different antihistamine with less sedating properties, and start Flonase. Advised the patient to f/u in one week if symptoms have not improved or to f/u sooner if she experiences wheezing or shortness of breath.   Plan 1. Resume protonix 40mg  daily 2. Stop Zyrtec. Switch to Xyzal 5mg  daily. 3. Start Flonase. 2 sprays per nostril per day for 1 week. Decrease to 1 spray per nostril if symptoms  improve. 4. F/u if symptoms do not improve.

## 2018-01-07 NOTE — Patient Instructions (Addendum)
It was a pleasure taking care of you today, Ms. Chopp!  For your cough - Restart taking protonix 40mg  daily - Stop taking Zyrtec and instead start taking Xyzal 5mg  daily. - Start using Flonase nasal spray. Do two sprays in each nostril once per day. If the symptoms have improved, decrease to 1 spray per nose once a day in about a week. - If your symptoms haven't improved in about 1 week, please come back to see in clinic. Please see a doctor sooner if you begin to experience wheezing or shortness of breath.  Feel free to call our clinic at [phone] if you have any questions!  Thanks, Dr. [place]

## 2018-01-07 NOTE — Progress Notes (Signed)
   CC: cough  HPI:   [name] is a 42 y.o. 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 Please see problem based charting for the status of the patient's current and chronic medical conditions.   Past Medical History:  Diagnosis Date  . Asthma    mild by PFT's 03/16/1998  . Cervical lymphadenopathy    biopsy negativeMarvetta [name])  . Chronic hypertension   . Complete spontaneous abortion    x2  . GERD (gastroesophageal reflux disease)   . History of abnormal Pap smear    s/p cryo( Dr. Wiliam Ke)  . HSV (herpes simplex virus) anogenital infection   . Hypertension   . Kidney stone   . Normal vaginal delivery    x3  . Rheumatoid arthritis (HCC)   . Thyroid disease affecting pregnancy   . 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   . Vaginal Pap smear, abnormal     Review of Systems:   Pertinent positives mentioned in HPI. Remainder of all ROS negative.   Physical Exam: Vitals:   01/07/18 1019  BP: 132/79  Pulse: 83  Temp: 98.8 F (37.1 C)  TempSrc: Oral  SpO2: 100%  Weight: 185 lb 6.4 oz (84.1 kg)  Height: 5\' 3"  (1.6 m)   Physical Exam  Constitutional: Well-developed, well-nourished, and in no distress.  Eyes: Pupils are equal, round, and reactive to light. EOM are normal.  HENT: No frontal or maxillary sinus tenderness. No cervical lymphadenopathy. Normal pharynx without tonsillar exudates or cobblestoning. Right and left ears have normal TM without bulging or erythema. Mild nasal mucosa bogginess with erythema. Cardiovascular: Normal rate and regular rhythm. No murmurs, rubs, or gallops. Pulmonary/Chest: Effort normal. Clear to auscultation bilaterally. No wheezes, rales, or  rhonchi. Skin: Warm and dry. No rashes or wounds.   Assessment & Plan:   See Encounters Tab for problem based charting.  Patient seen with Dr. 

## 2018-01-17 NOTE — Progress Notes (Signed)
Internal Medicine Clinic Attending  I saw and evaluated the patient.  I personally confirmed the key portions of the history and exam documented by Dr. Dorrell and I reviewed pertinent patient test results.  The assessment, diagnosis, and plan were formulated together and I agree with the documentation in the resident's note. 

## 2018-01-21 DIAGNOSIS — H20013 Primary iridocyclitis, bilateral: Secondary | ICD-10-CM | POA: Diagnosis not present

## 2018-02-18 DIAGNOSIS — K51211 Ulcerative (chronic) proctitis with rectal bleeding: Secondary | ICD-10-CM | POA: Diagnosis not present

## 2018-03-17 DIAGNOSIS — H20021 Recurrent acute iridocyclitis, right eye: Secondary | ICD-10-CM | POA: Diagnosis not present

## 2018-03-20 ENCOUNTER — Ambulatory Visit (HOSPITAL_COMMUNITY)
Admission: EM | Admit: 2018-03-20 | Discharge: 2018-03-20 | Disposition: A | Discharge status: Home / Self Care | Payer: Medicaid Other | Attending: Physician Assistant | Admitting: Physician Assistant

## 2018-03-20 ENCOUNTER — Encounter (HOSPITAL_COMMUNITY): Payer: Self-pay | Admitting: Emergency Medicine

## 2018-03-20 DIAGNOSIS — R197 Diarrhea, unspecified: Secondary | ICD-10-CM | POA: Diagnosis not present

## 2018-03-20 DIAGNOSIS — R69 Illness, unspecified: Secondary | ICD-10-CM | POA: Insufficient documentation

## 2018-03-20 DIAGNOSIS — J111 Influenza due to unidentified influenza virus with other respiratory manifestations: Secondary | ICD-10-CM

## 2018-03-20 MED ORDER — OSELTAMIVIR PHOSPHATE 75 MG PO CAPS: 75.0000 mg | ORAL_CAPSULE | Freq: Two times a day (BID) | ORAL | 0 refills | Status: DC

## 2018-03-20 MED ORDER — ONDANSETRON HCL 4 MG PO TABS: 4.0000 mg | ORAL_TABLET | Freq: Three times a day (TID) | ORAL | 0 refills | Status: DC | PRN

## 2018-03-20 NOTE — ED Provider Notes (Signed)
03/20/2018 4:33 PM   DOB: 1976-02-01 / MRN: [id number]  SUBJECTIVE:  Sue [name] is a 43 y.o. female presenting for multiple medical complaints.  Patient tells me that she has been coughing now for about 1 month.  She was seen for this and advised to take GERD medicine.  She complied.  Tells me the cough has been worse now for about 4 days she also has a low-grade fever, headache and diarrhea.  She has a history of ulcerative colitis and takes Biologics for this.  She denies chest pain and shortness of breath.  Fortunately she did have her flu shot.  Her diarrhea is nonbloody.  She did eat at cookout the day that this started but tells me she had a cookout after the symptoms began.  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  She is allergic to ciprofloxacin; clindamycin/lincomycin; sulfamethoxazole-trimethoprim; and amoxicillin.   She  has a past medical history of Asthma, Cervical lymphadenopathy, Chronic hypertension, Complete spontaneous abortion, GERD (gastroesophageal reflux disease), History of abnormal Pap smear, HSV (herpes simplex virus) anogenital infection, Hypertension, Kidney stone, Normal vaginal delivery, Rheumatoid arthritis (HCC), Thyroid disease affecting pregnancy, ULCERATIVE PROCTITIS (12/26/2007), and Vaginal Pap smear, abnormal.    She  reports that she has never smoked. She has never used smokeless tobacco. She reports that she does not drink alcohol or use drugs. She  reports being sexually active. She reports using the following method of birth control/protection: Injection. The patient  has a past surgical history that includes biopsy of lymph node in R neck (2007.) and LEEP.  Her family history includes Diabetes in her father and sister; Heart attack in her father and maternal grandmother; Hypertension in her father and mother; Stroke in her maternal grandmother; Thyroid disease in her sister; Ulcerative colitis in her mother.  ROS per HPI  OBJECTIVE:  BP  (!) 142/94   Pulse 97   Temp 99.7 F (37.6 C)   Resp 18   LMP 03/08/2018   SpO2 100%   Wt Readings from Last 3 Encounters:  01/07/18 185 lb 6.4 oz (84.1 kg)  07/01/17 171 lb (77.6 kg)  06/28/17 170 lb 6.4 oz (77.3 kg)   Temp Readings from Last 3 Encounters:  03/20/18 99.7 F (37.6 C)  01/07/18 98.8 F (37.1 C) (Oral)  07/01/17 98.8 F (37.1 C) (Oral)   BP Readings from Last 3 Encounters:  03/20/18 (!) 142/94  01/07/18 132/79  07/01/17 105/65   Pulse Readings from Last 3 Encounters:  03/20/18 97  01/07/18 83  07/01/17 75    Physical Exam Vitals signs reviewed.  Constitutional:      General: She is not in acute distress.    Appearance: She is ill-appearing. She is not toxic-appearing or diaphoretic.  Eyes:     Pupils: Pupils are equal, round, and reactive to light.  Cardiovascular:     Rate and Rhythm: Normal rate and regular rhythm.  Pulmonary:     Effort: Pulmonary effort is normal.     Breath sounds: Normal breath sounds.  Abdominal:     General: There is no distension.     Palpations: There is no mass.     Tenderness: There is no abdominal tenderness. There is no right CVA tenderness, left CVA tenderness, guarding or rebound.     Hernia: No hernia is present.  Skin:    General: Skin is dry.  Neurological:     Mental Status: She is alert and oriented to person, place,  and time.     Cranial Nerves: No cranial nerve deficit.     Gait: Gait normal.     No results found for this or any previous visit (from the past 72 hour(s)).  No results found.  ASSESSMENT AND PLAN:   Diarrhea, unspecified type  Influenza-like illness - Patient with cough, fever, headache along with diarrhea.  She tells me she feels terrible.  She looks ill but is nontoxic.  Her vital signs look good here today.  We will treat her for influenza, possibly flu B.  RTC and ED precautions discussed.    Discharge Instructions     Please start the Tamiflu as soon as possible.  Start  taking Imodium every 6 hours as needed for diarrhea.  Please keep fluids going in.  If you have chest pain and shortness of breath please go to the emergency department.        The patient is advised to call or return to clinic if she does not see an improvement in symptoms, or to seek the care of the closest emergency department if she worsens with the above plan.   Deliah Boston, MHS, PA-C 03/20/2018 4:33 PM   Ofilia Neas, PA-C 03/20/18 1633

## 2018-03-20 NOTE — Discharge Instructions (Signed)
Please start the Tamiflu as soon as possible.  Start taking Imodium every 6 hours as needed for diarrhea.  Please keep fluids going in.  If you have chest pain and shortness of breath please go to the emergency department.

## 2018-03-20 NOTE — ED Triage Notes (Signed)
Pt states shes had a cough since October, pt states the cough hasn't gotten any better. Thursday c/o nausea and diarrhea.

## 2018-03-22 ENCOUNTER — Telehealth: Payer: Self-pay

## 2018-03-22 ENCOUNTER — Other Ambulatory Visit: Payer: Self-pay | Admitting: Obstetrics

## 2018-03-22 DIAGNOSIS — I10 Essential (primary) hypertension: Secondary | ICD-10-CM

## 2018-03-22 MED ORDER — AMLODIPINE BESYLATE 10 MG PO TABS: 10.0000 mg | ORAL_TABLET | Freq: Every day | ORAL | 6 refills | Status: DC

## 2018-03-22 NOTE — Telephone Encounter (Signed)
Norvasc Rx

## 2018-03-22 NOTE — Telephone Encounter (Signed)
Call received from front desk Pt requesting Norvasc refill  Last seen on 06/28/17 Post partum was 04/27/17 No recent appt last depo injection 09/22/17 Pt has Chronic HTN.  Please advise if pt needs to F/U Blood Pressure management w/ PCP.

## 2018-03-25 DIAGNOSIS — K6289 Other specified diseases of anus and rectum: Secondary | ICD-10-CM | POA: Diagnosis not present

## 2018-03-25 DIAGNOSIS — K5120 Ulcerative (chronic) proctitis without complications: Secondary | ICD-10-CM | POA: Diagnosis not present

## 2018-03-25 DIAGNOSIS — K5289 Other specified noninfective gastroenteritis and colitis: Secondary | ICD-10-CM | POA: Diagnosis not present

## 2018-03-29 DIAGNOSIS — H20021 Recurrent acute iridocyclitis, right eye: Secondary | ICD-10-CM | POA: Diagnosis not present

## 2018-04-07 ENCOUNTER — Encounter: Payer: Self-pay | Admitting: Endocrinology

## 2018-04-07 ENCOUNTER — Ambulatory Visit: Payer: Medicaid Other | Admitting: Endocrinology

## 2018-04-07 VITALS — BP 118/82 | HR 87 | Ht 64.0 in | Wt 182.0 lb

## 2018-04-07 DIAGNOSIS — E0590 Thyrotoxicosis, unspecified without thyrotoxic crisis or storm: Secondary | ICD-10-CM | POA: Diagnosis not present

## 2018-04-07 LAB — TSH: TSH: 0.25 u[IU]/mL — ABNORMAL LOW (ref 0.35–4.50)

## 2018-04-07 LAB — T4, FREE: Free T4: 1.06 ng/dL (ref 0.60–1.60)

## 2018-04-07 MED ORDER — METHIMAZOLE 5 MG PO TABS: 5.0000 mg | ORAL_TABLET | Freq: Every day | ORAL | 1 refills | Status: DC

## 2018-04-07 NOTE — Progress Notes (Signed)
Subjective:    Patient ID: Sue [name], female    DOB: 03/03/1976, 43 y.o.   MRN: [id number]  HPI Pt returns for f/u of hyperthyroidism (dx'ed 2010; she has never had thyroid imaging, but 2014 CT showed 6 x 5 mm nodule in the left lobe of the thyroid; hyperthyroidism was mild, but she was still rx'ed with tapazole, due to poor wt gain).  pt states she feels well in general.  She is now 1 year postpartum.  Past Medical History:  Diagnosis Date  . Asthma    mild by PFT's 03/16/1998  . Cervical lymphadenopathy    biopsy negativeMarvetta Gibbons)  . Chronic hypertension   . Complete spontaneous abortion    x2  . GERD (gastroesophageal reflux disease)   . History of abnormal Pap smear    s/p cryo( Dr. Wiliam Ke)  . HSV (herpes simplex virus) anogenital infection   . Hypertension   . Kidney stone   . Normal vaginal delivery    x3  . Rheumatoid arthritis (HCC)   . Thyroid disease affecting pregnancy   . 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   . Vaginal Pap smear, abnormal     Past Surgical History:  Procedure Laterality Date  . biopsy of lymph node in R neck  2007.  Marland Kitchen LEEP      Social History   Socioeconomic History  . Marital status: Single    Spouse name: Not on file  . Number of children: Not on file  . Years of education: Not on file  . Highest education level: Not on file  Occupational History  . Not on file  Social Needs  . Financial resource strain: Not on file  . Food insecurity:    Worry: Not on file    Inability: Not on file  . Transportation needs:    Medical: Not on file    Non-medical: Not on file  Tobacco Use  . Smoking status: Never Smoker  . Smokeless tobacco: Never Used  Substance and Sexual Activity  . Alcohol use: No    Alcohol/week: 0.0 standard drinks    Comment: occasional beer and liquor on weekends    . Drug use: No  . Sexual activity: Yes    Birth control/protection: Injection  Lifestyle  . Physical activity:    Days per week: Not on file    Minutes per session: Not on file  . Stress: Not on file  Relationships  . Social connections:    Talks on phone: Not on file    Gets together: Not on file    Attends religious service: Not on file    Active member of club or organization: Not on file    Attends meetings of clubs or organizations: Not on file    Relationship status: Not on file  . Intimate partner violence:    Fear of current or ex partner: Not on file    Emotionally abused: Not on file    Physically abused: Not on file    Forced sexual activity: Not on file  Other Topics Concern  . Not on file  Social History Narrative  . Not on file    Current Outpatient Medications on File Prior to Visit  Medication Sig Dispense Refill  . acetaminophen (TYLENOL) 500 MG tablet Take 1,000 mg by mouth every 6 (six) hours as needed for  mild pain, moderate pain or headache.    . Adalimumab (HUMIRA Milner) Inject into the skin.    Marland Kitchen. albuterol (PROVENTIL HFA;VENTOLIN HFA) 108 (90 Base) MCG/ACT inhaler Inhale 1-2 puffs into the lungs every 6 (six) hours as needed for wheezing or shortness of breath. 1 Inhaler 0  . amLODipine (NORVASC) 10 MG tablet Take 1 tablet (10 mg total) by mouth daily. 30 tablet 6  . calcium carbonate (TUMS - DOSED IN MG ELEMENTAL CALCIUM) 500 MG chewable tablet Chew 1 tablet by mouth daily as needed for heartburn.     . Chlorpheniramine-DM (CORICIDIN HBP COUGH/COLD PO) Take 1 tablet by mouth daily as needed (for cough).     Marland Kitchen. levocetirizine (XYZAL) 5 MG tablet TAKE 1 TABLET(5 MG) BY MOUTH EVERY EVENING 90 tablet 1  . mesalamine (CANASA) 1000 MG suppository Place 1,000 mg rectally as needed (Ulcerative Proctitis).     . MULTIPLE VITAMIN PO Take 1 tablet by mouth daily.    Marland Kitchen. NORTREL 1/35, 28, tablet TAKE 1 TABLET BY MOUTH DAILY 84 tablet 4  . pantoprazole (PROTONIX) 40 MG  tablet Take 1 tablet (40 mg total) by mouth daily. 30 tablet 2  . valACYclovir (VALTREX) 500 MG tablet Take 1 tablet (500 mg total) by mouth daily. 30 tablet 1   No current facility-administered medications on file prior to visit.     Allergies  Allergen Reactions  . Ciprofloxacin Anaphylaxis and Swelling    Throat swells  . Clindamycin/Lincomycin Anaphylaxis and Swelling  . Sulfamethoxazole-Trimethoprim Swelling    Swelling is of the lips. Swelling is of the lips.  . Amoxicillin Rash    Has patient had a PCN reaction causing immediate rash, facial/tongue/throat swelling, SOB or lightheadedness with hypotension: no Has patient had a PCN reaction causing severe rash involving mucus membranes or skin necrosis: pt had rash -- non severe  Has patient had a PCN reaction that required hospitalization: no Has patient had a PCN reaction occurring within the last 10 years: no If all of the above answers are "NO", then may proceed with Cephalosporin use.     Family History  Problem Relation Age of Onset  . Ulcerative colitis Mother   . Hypertension Mother   . Diabetes Father   . Heart attack Father   . Hypertension Father   . Diabetes Sister   . Thyroid disease Sister   . Heart attack Maternal Grandmother   . Stroke Maternal Grandmother     BP 118/82 (BP Location: Left Arm, Patient Position: Sitting, Cuff Size: Normal)   Pulse 87   Ht 5\' 4"  (1.626 m)   Wt 182 lb (82.6 kg)   LMP 03/08/2018   SpO2 96%   BMI 31.24 kg/m    Review of Systems She has lost almost all of her pregnancy weight.      Objective:   Physical Exam VITAL SIGNS:  See vs page GENERAL: no distress NECK: thyroid is slightly enlarged, with an irreg surface.  no thyroid nodule is palpable.  No palpable lymphadenopathy at the anterior neck.    Lab Results  Component Value Date   TSH 0.25 (L) 04/07/2018   T4TOTAL 12.3 (H) 10/06/2016      Assessment & Plan:  Hyperthyroidism: recurrent off rx.  resume the  methimazole at 5 mg daily.

## 2018-04-07 NOTE — Patient Instructions (Signed)
Blood tests are requested for you today.  We'll let you know about the results.  Please come back for a follow-up appointment in 6-12 months. most of the time, a "lumpy thyroid" will eventually become overactive again.  this is usually a slow process, happening over the span of many years.

## 2018-04-12 DIAGNOSIS — H20021 Recurrent acute iridocyclitis, right eye: Secondary | ICD-10-CM | POA: Diagnosis not present

## 2018-04-12 DIAGNOSIS — H20022 Recurrent acute iridocyclitis, left eye: Secondary | ICD-10-CM | POA: Diagnosis not present

## 2018-04-13 DIAGNOSIS — H209 Unspecified iridocyclitis: Secondary | ICD-10-CM | POA: Diagnosis not present

## 2018-04-13 DIAGNOSIS — R05 Cough: Secondary | ICD-10-CM | POA: Diagnosis not present

## 2018-04-13 DIAGNOSIS — M129 Arthropathy, unspecified: Secondary | ICD-10-CM | POA: Diagnosis not present

## 2018-04-13 DIAGNOSIS — K529 Noninfective gastroenteritis and colitis, unspecified: Secondary | ICD-10-CM | POA: Diagnosis not present

## 2018-04-13 DIAGNOSIS — K219 Gastro-esophageal reflux disease without esophagitis: Secondary | ICD-10-CM | POA: Diagnosis not present

## 2018-04-13 DIAGNOSIS — M1380 Other specified arthritis, unspecified site: Secondary | ICD-10-CM | POA: Diagnosis not present

## 2018-04-13 DIAGNOSIS — K5190 Ulcerative colitis, unspecified, without complications: Secondary | ICD-10-CM | POA: Diagnosis not present

## 2018-04-13 DIAGNOSIS — Z79899 Other long term (current) drug therapy: Secondary | ICD-10-CM | POA: Diagnosis not present

## 2018-04-13 DIAGNOSIS — K5120 Ulcerative (chronic) proctitis without complications: Secondary | ICD-10-CM | POA: Diagnosis not present

## 2018-06-06 ENCOUNTER — Other Ambulatory Visit: Payer: Self-pay

## 2018-06-06 ENCOUNTER — Other Ambulatory Visit: Payer: Self-pay | Admitting: Obstetrics & Gynecology

## 2018-06-06 DIAGNOSIS — A6009 Herpesviral infection of other urogenital tract: Secondary | ICD-10-CM

## 2018-06-06 NOTE — Telephone Encounter (Signed)
albuterol (PROVENTIL HFA;VENTOLIN HFA) 108 (90 Base) MCG/ACT inhaler  Refill request @  [facility] [phone] - [place], [place] - [address] AT [facility] [facility] [address] & [address] [phone] (Phone) [phone] (Fax)

## 2018-06-07 MED ORDER — ALBUTEROL SULFATE HFA 108 (90 BASE) MCG/ACT IN AERS: 1.0000 | INHALATION_SPRAY | Freq: Four times a day (QID) | RESPIRATORY_TRACT | 1 refills | Status: DC | PRN

## 2018-07-25 DIAGNOSIS — E559 Vitamin D deficiency, unspecified: Secondary | ICD-10-CM | POA: Diagnosis not present

## 2018-07-25 DIAGNOSIS — M129 Arthropathy, unspecified: Secondary | ICD-10-CM | POA: Diagnosis not present

## 2018-07-25 DIAGNOSIS — M1990 Unspecified osteoarthritis, unspecified site: Secondary | ICD-10-CM | POA: Diagnosis not present

## 2018-07-25 DIAGNOSIS — Z79899 Other long term (current) drug therapy: Secondary | ICD-10-CM | POA: Diagnosis not present

## 2018-07-25 DIAGNOSIS — Z5181 Encounter for therapeutic drug level monitoring: Secondary | ICD-10-CM | POA: Diagnosis not present

## 2018-07-25 DIAGNOSIS — R5383 Other fatigue: Secondary | ICD-10-CM | POA: Diagnosis not present

## 2018-07-25 DIAGNOSIS — K529 Noninfective gastroenteritis and colitis, unspecified: Secondary | ICD-10-CM | POA: Diagnosis not present

## 2018-07-25 DIAGNOSIS — M064 Inflammatory polyarthropathy: Secondary | ICD-10-CM | POA: Diagnosis not present

## 2018-07-25 DIAGNOSIS — H209 Unspecified iridocyclitis: Secondary | ICD-10-CM | POA: Diagnosis not present

## 2018-07-27 ENCOUNTER — Telehealth: Payer: Self-pay | Admitting: Internal Medicine

## 2018-07-27 ENCOUNTER — Telehealth: Payer: Self-pay | Admitting: Endocrinology

## 2018-07-27 NOTE — Telephone Encounter (Signed)
Next office appt is 09/22/18. Do you want her to come in for just a nurse visit for B12 injection? Or would you prefer to move her appt with you up to a sooner date and further discuss?

## 2018-07-27 NOTE — Telephone Encounter (Signed)
Please refer this problem to PCP

## 2018-07-27 NOTE — Telephone Encounter (Signed)
Called pt and made her aware. Verbalized acceptance and understanding. 

## 2018-07-27 NOTE — Telephone Encounter (Signed)
Patients states she saw her Rheumateology on Monday and they just called stating her B-12 was 50 and below and that Dr.Ellison needed to start her on an injection as soon as possible.  Please Advise, Thanks

## 2018-07-27 NOTE — Telephone Encounter (Signed)
Pt states her Rheumatologist states her B-12 is really low and that she needs to start getting injections.  Pt to contact Dr Hezzie Bump.  Pt would like a call back.

## 2018-07-28 NOTE — Telephone Encounter (Signed)
I do not believe I have ever seen this patient.  Has she had blood work?  She may need an appointment for this issue.

## 2018-07-29 NOTE — Telephone Encounter (Signed)
Called pt - no answer; left message lab result was received.  Front office stated pt is requesting rx be sent to her pharmacy and she will give herself the injection. Result place in Dr [employer] box.

## 2018-07-29 NOTE — Telephone Encounter (Signed)
Pt is checking to see we received her labs results from Rheumatologist office, she is wanting to started her B12 injections immed pt is calling the office to see if they faxed results over pls contact pt [phone]

## 2018-08-02 ENCOUNTER — Other Ambulatory Visit: Payer: Self-pay | Admitting: Internal Medicine

## 2018-08-02 MED ORDER — CYANOCOBALAMIN 1000 MCG/ML IJ SOLN: 1000.0000 ug | INTRAMUSCULAR | 0 refills | Status: DC

## 2018-08-02 NOTE — Telephone Encounter (Signed)
Ok will review results.  Do we have access to the rheumatology office notes as well?

## 2018-08-08 NOTE — Telephone Encounter (Signed)
Received TC from patient asking about getting her B-12 injections, states she has not heard anything.  Pt informed RX was sent to [facility] on 08/02/18 , 6 week supply.  Pt instructed to call [facility] and to call [facility] back for any questions or concerns.  She verbalized understanding. SChaplin, RN,BSN

## 2018-08-15 DIAGNOSIS — K219 Gastro-esophageal reflux disease without esophagitis: Secondary | ICD-10-CM | POA: Diagnosis not present

## 2018-08-15 DIAGNOSIS — E538 Deficiency of other specified B group vitamins: Secondary | ICD-10-CM | POA: Diagnosis not present

## 2018-08-15 DIAGNOSIS — K5130 Ulcerative (chronic) rectosigmoiditis without complications: Secondary | ICD-10-CM | POA: Diagnosis not present

## 2018-08-24 ENCOUNTER — Ambulatory Visit: Payer: Medicaid Other

## 2018-09-05 ENCOUNTER — Encounter: Payer: Self-pay | Admitting: *Deleted

## 2018-09-05 ENCOUNTER — Other Ambulatory Visit: Payer: Self-pay | Admitting: Internal Medicine

## 2018-09-06 NOTE — Telephone Encounter (Signed)
Just spoke with patient.  She is coming on Monday 09/12/2018 at 10:30 am for labs.

## 2018-09-06 NOTE — Telephone Encounter (Signed)
Call placed to patient. No answer. Left message on VM requesting return call. L. [place]atte, RN, BSN     

## 2018-09-06 NOTE — Telephone Encounter (Signed)
Before refilling, would like her to come in for blood work to recheck B12 levels and CBC. Can we arrange that and see if she needs to continue supplementation?

## 2018-09-08 ENCOUNTER — Other Ambulatory Visit: Payer: Self-pay | Admitting: Internal Medicine

## 2018-09-08 DIAGNOSIS — E0590 Thyrotoxicosis, unspecified without thyrotoxic crisis or storm: Secondary | ICD-10-CM

## 2018-09-08 DIAGNOSIS — E538 Deficiency of other specified B group vitamins: Secondary | ICD-10-CM

## 2018-09-08 NOTE — Telephone Encounter (Signed)
Thank you, labs ordered. Will check B12, CBC, will also check TSH and fT4 as she has endocrinology follow up this month to save her a blood draw then.

## 2018-09-12 ENCOUNTER — Other Ambulatory Visit: Payer: Self-pay

## 2018-09-12 ENCOUNTER — Other Ambulatory Visit (INDEPENDENT_AMBULATORY_CARE_PROVIDER_SITE_OTHER): Payer: Medicaid Other

## 2018-09-12 DIAGNOSIS — E039 Hypothyroidism, unspecified: Secondary | ICD-10-CM | POA: Diagnosis not present

## 2018-09-12 DIAGNOSIS — E0590 Thyrotoxicosis, unspecified without thyrotoxic crisis or storm: Secondary | ICD-10-CM

## 2018-09-12 DIAGNOSIS — E538 Deficiency of other specified B group vitamins: Secondary | ICD-10-CM

## 2018-09-12 NOTE — Addendum Note (Signed)
Addended by: Orson Gear on: 09/12/2018 10:06 AM   Modules accepted: Orders

## 2018-09-13 LAB — TSH: TSH: 0.655 u[IU]/mL (ref 0.450–4.500)

## 2018-09-13 LAB — CBC WITH DIFFERENTIAL/PLATELET
Basophils Absolute: 0 10*3/uL (ref 0.0–0.2)
Basos: 1 %
EOS (ABSOLUTE): 0.2 10*3/uL (ref 0.0–0.4)
Eos: 2 %
Hematocrit: 38.8 % (ref 34.0–46.6)
Hemoglobin: 13 g/dL (ref 11.1–15.9)
Immature Grans (Abs): 0 10*3/uL (ref 0.0–0.1)
Immature Granulocytes: 0 %
Lymphocytes Absolute: 2.4 10*3/uL (ref 0.7–3.1)
Lymphs: 34 %
MCH: 28.5 pg (ref 26.6–33.0)
MCHC: 33.5 g/dL (ref 31.5–35.7)
MCV: 85 fL (ref 79–97)
Monocytes Absolute: 0.5 10*3/uL (ref 0.1–0.9)
Monocytes: 8 %
Neutrophils Absolute: 3.9 10*3/uL (ref 1.4–7.0)
Neutrophils: 55 %
Platelets: 407 10*3/uL (ref 150–450)
RBC: 4.56 x10E6/uL (ref 3.77–5.28)
RDW: 14.1 % (ref 11.7–15.4)
WBC: 7 10*3/uL (ref 3.4–10.8)

## 2018-09-13 LAB — T4, FREE: Free T4: 1.26 ng/dL (ref 0.82–1.77)

## 2018-09-13 LAB — VITAMIN B12: Vitamin B-12: 1897 pg/mL — ABNORMAL HIGH (ref 232–1245)

## 2018-09-13 NOTE — Telephone Encounter (Signed)
I will refill X 1.  The need for this is not well documented in the chart.  Would need to be addressed with new PCP.

## 2018-09-19 ENCOUNTER — Encounter: Payer: Self-pay | Admitting: Internal Medicine

## 2018-09-19 ENCOUNTER — Other Ambulatory Visit: Payer: Self-pay

## 2018-09-19 ENCOUNTER — Ambulatory Visit: Payer: Medicaid Other | Admitting: Internal Medicine

## 2018-09-19 DIAGNOSIS — E0590 Thyrotoxicosis, unspecified without thyrotoxic crisis or storm: Secondary | ICD-10-CM

## 2018-09-19 DIAGNOSIS — E538 Deficiency of other specified B group vitamins: Secondary | ICD-10-CM

## 2018-09-19 DIAGNOSIS — M069 Rheumatoid arthritis, unspecified: Secondary | ICD-10-CM

## 2018-09-19 DIAGNOSIS — K5190 Ulcerative colitis, unspecified, without complications: Secondary | ICD-10-CM | POA: Diagnosis not present

## 2018-09-19 DIAGNOSIS — I10 Essential (primary) hypertension: Secondary | ICD-10-CM

## 2018-09-19 DIAGNOSIS — L818 Other specified disorders of pigmentation: Secondary | ICD-10-CM

## 2018-09-19 DIAGNOSIS — M0520 Rheumatoid vasculitis with rheumatoid arthritis of unspecified site: Secondary | ICD-10-CM

## 2018-09-19 NOTE — Assessment & Plan Note (Signed)
>>  ASSESSMENT AND PLAN FOR RHEUMATOID ARTERITIS (HCC) WRITTEN ON 09/19/2018  2:47 PM BY Dolan Amen, MD  Assessment:  Sue [name] is a 43 y/o patient who presents to the office with left knee pains secondary to RA flair up.   Plan:  - Education on Rheumatoid Arteritis. - Take tylenol PRN. - Reevaluate in 3 months time.

## 2018-09-19 NOTE — Assessment & Plan Note (Addendum)
Assessment:  Sue [name] is a 43 y/o patient who presents to the office with left knee pains secondary to RA flair up.   Plan:  - Education on Rheumatoid Arteritis. - Take tylenol PRN. - Reevaluate in 3 months time.

## 2018-09-19 NOTE — Progress Notes (Signed)
   CC: Knee Pain  HPI:  [name] is a 43 y.o., with a PMHx of HTN, Ulcerative colitis, RA, and hyperthyroidism, who presents to the clinic with left knee pain. [name] states that her knee pain started a month ago. During this past month, she has had two episodes of knee pain with radiation to the back of her knee. She describes the pain as "throbbing" in character and was 10/10 on the pain scale. Between these episodes, [name] has constant 1-3/10 pain that is "throbbing" in character. Ice and tylenol alleviate her pain, while weight bearing exacerbates her pain. Her left leg is warmer and "mushier" than her right leg. She does not complain of nausea, vomiting, constipation, or diarrhea.    Secondary to her chief complaint, [name] asked about continuing her Vitamin B12 injections. Her lab results were reviewed and discussed with her. She was instructed to discontinue her vitamin B12 injections at this time.   Past Medical History:  Diagnosis Date  . Asthma    mild by PFT's 03/16/1998  . Cervical lymphadenopathy    biopsy negativeGwenlyn [name])  . Chronic hypertension   . Complete spontaneous abortion    x2  . GERD (gastroesophageal reflux disease)   . History of abnormal Pap smear    s/p cryo( Sue. Alferd [name])  . HSV (herpes simplex virus) anogenital infection   . Hypertension   . Kidney stone   . Normal vaginal delivery    x3  . Rheumatoid arthritis (Sue [name])   . Thyroid disease affecting pregnancy   . ULCERATIVE PROCTITIS 12/26/2007   Flex sig 12/12/07 Sue [name] was c/w ulcerative proctitis.Started on mesalamine suppositories and all sxs and pathology resolved on repeat flex sig 06/2008.   . Vaginal Pap smear, abnormal    Review of Systems:   General: Denies fatigue, fever, or chills.  Respiratory: Denies wheezing, shortness of breath, or cough.  GI: Denies nausea, vomiting, diarrhea, or constipation. Endocrine: Denies cold/heat intolerance or excessive sweating.    Physical  Exam:  Vitals:   09/19/18 1320  BP: 116/72  Pulse: 74  Temp: 99 F (37.2 C)  TempSrc: Oral  SpO2: 100%  Weight: 179 lb 3.2 oz (81.3 kg)  Height: 5\' 4"  (1.626 m)   General: Female , well kempt patient, non-distressed in appearance. Skin: Tattoo appreciated on right ankle Head: Normocephalic, atraumatic.  Neck: Thyroid palpable on physical exam with no appreciated masses, full ROM intact.  Heart: RRR, no murmurs, rubs, or gallops appreciated.  Lungs: Clear to auscultation bilaterally, no wheezes, rhonchi, or rales.  MSK: Full ROM in lower extremities with left knee erythema. Neuro: Strength 5/5 bilaterally in the lower extremities with sensation intact, Achilles reflex 2+.    Assessment & Plan:   See Encounters Tab for problem based charting.  Patient seen with Sue. Angelia Mould

## 2018-09-20 NOTE — Progress Notes (Signed)
Internal Medicine Clinic Attending  I saw and evaluated the patient.  I personally confirmed the key portions of the history and exam documented by Dr. Gilford Rile and I reviewed pertinent patient test results.  The assessment, diagnosis, and plan were formulated together and I agree with the documentation in the resident's note.    Will add for her B12 deficiency she did have confirmed B12 deficiency, is now replete to high after paternal injections, will plan to repeat her B12 level in about 3 months, she may need monthly injections going forward.

## 2018-09-21 ENCOUNTER — Other Ambulatory Visit: Payer: Self-pay

## 2018-09-22 ENCOUNTER — Ambulatory Visit (INDEPENDENT_AMBULATORY_CARE_PROVIDER_SITE_OTHER): Payer: Medicaid Other | Admitting: Endocrinology

## 2018-09-22 ENCOUNTER — Encounter: Payer: Self-pay | Admitting: Endocrinology

## 2018-09-22 VITALS — BP 124/84 | HR 81 | Ht 64.0 in | Wt 178.2 lb

## 2018-09-22 DIAGNOSIS — E0590 Thyrotoxicosis, unspecified without thyrotoxic crisis or storm: Secondary | ICD-10-CM

## 2018-09-22 NOTE — Progress Notes (Signed)
Subjective:    Patient ID: Sue [name], female    DOB: [birth year], 43 y.o.   MRN: [id number]  HPI Pt returns for f/u of hyperthyroidism (dx'ed 2010; she has never had dedicated thyroid imaging, but 2014 CT showed 6 x 5 mm nodule in the left lobe of the thyroid; hyperthyroidism was mild, but she was still rx'ed with tapazole, due to poor wt gain).  pt states she feels well in general.  Specifically, she denies palpitations and tremor.  [name] has had vasectomy.   Past Medical History:  Diagnosis Date  . Asthma    mild by PFT's 03/16/1998  . Cervical lymphadenopathy    biopsy negativeMarvetta Gibbons)  . Chronic hypertension   . Complete spontaneous abortion    x2  . GERD (gastroesophageal reflux disease)   . History of abnormal Pap smear    s/p cryo( Dr. Wiliam Ke)  . HSV (herpes simplex virus) anogenital infection   . Hypertension   . Kidney stone   . Normal vaginal delivery    x3  . Rheumatoid arthritis (HCC)   . Thyroid disease affecting pregnancy   . 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   . Vaginal Pap smear, abnormal     Past Surgical History:  Procedure Laterality Date  . biopsy of lymph node in R neck  2007.  Marland Kitchen LEEP      Social History   Socioeconomic History  . Marital status: Single    Spouse name: Not on file  . Number of children: Not on file  . Years of education: Not on file  . Highest education level: Not on file  Occupational History  . Not on file  Social Needs  . Financial resource strain: Not on file  . Food insecurity    Worry: Not on file    Inability: Not on file  . Transportation needs    Medical: Not on file    Non-medical: Not on file  Tobacco Use  . Smoking status: Never Smoker  . Smokeless tobacco: Never Used  Substance and Sexual Activity  . Alcohol use: Yes    Alcohol/week: 0.0 standard  drinks    Comment: Sometimes.  . Drug use: No  . Sexual activity: Yes    Birth control/protection: Injection  Lifestyle  . Physical activity    Days per week: Not on file    Minutes per session: Not on file  . Stress: Not on file  Relationships  . Social Musician on phone: Not on file    Gets together: Not on file    Attends religious service: Not on file    Active member of club or organization: Not on file    Attends meetings of clubs or organizations: Not on file    Relationship status: Not on file  . Intimate partner violence    Fear of current or ex partner: Not on file    Emotionally abused: Not on file    Physically abused: Not on file    Forced sexual activity: Not on file  Other Topics Concern  . Not on file  Social History Narrative  . Not on file    Current Outpatient Medications on File Prior to Visit  Medication Sig Dispense Refill  . acetaminophen (TYLENOL) 500 MG tablet Take 1,000 mg by mouth every 6 (six) hours as needed  for mild pain, moderate pain or headache.    . Adalimumab (HUMIRA Zeigler) Inject into the skin.    Marland Kitchen. albuterol (PROVENTIL HFA;VENTOLIN HFA) 108 (90 Base) MCG/ACT inhaler Inhale 1-2 puffs into the lungs every 6 (six) hours as needed for wheezing or shortness of breath. 1 Inhaler 1  . amLODipine (NORVASC) 10 MG tablet Take 1 tablet (10 mg total) by mouth daily. 30 tablet 6  . calcium carbonate (TUMS - DOSED IN MG ELEMENTAL CALCIUM) 500 MG chewable tablet Chew 1 tablet by mouth daily as needed for heartburn.     . Chlorpheniramine-DM (CORICIDIN HBP COUGH/COLD PO) Take 1 tablet by mouth daily as needed (for cough).     . cyanocobalamin (,VITAMIN B-12,) 1000 MCG/ML injection INJECT 1 ML INTO THE MUSCLE ONCE A WEEK 6 mL 0  . levocetirizine (XYZAL) 5 MG tablet TAKE 1 TABLET(5 MG) BY MOUTH EVERY EVENING 90 tablet 1  . mesalamine (CANASA) 1000 MG suppository Place 1,000 mg rectally as needed (Ulcerative Proctitis).     . methimazole (TAPAZOLE) 5  MG tablet Take 1 tablet (5 mg total) by mouth daily. 90 tablet 1  . MULTIPLE VITAMIN PO Take 1 tablet by mouth daily.    Marland Kitchen. NORTREL 1/35, 28, tablet TAKE 1 TABLET BY MOUTH DAILY 84 tablet 4  . pantoprazole (PROTONIX) 40 MG tablet Take 1 tablet (40 mg total) by mouth daily. 30 tablet 2  . valACYclovir (VALTREX) 500 MG tablet TAKE 1 TABLET BY MOUTH ONCE DAILY 30 tablet 1   No current facility-administered medications on file prior to visit.     Allergies  Allergen Reactions  . Ciprofloxacin Anaphylaxis and Swelling    Throat swells  . Clindamycin/Lincomycin Anaphylaxis and Swelling  . Sulfamethoxazole-Trimethoprim Swelling    Swelling is of the lips. Swelling is of the lips.  . Amoxicillin Rash    Has patient had a PCN reaction causing immediate rash, facial/tongue/throat swelling, SOB or lightheadedness with hypotension: no Has patient had a PCN reaction causing severe rash involving mucus membranes or skin necrosis: pt had rash -- non severe  Has patient had a PCN reaction that required hospitalization: no Has patient had a PCN reaction occurring within the last 10 years: no If all of the above answers are "NO", then may proceed with Cephalosporin use.     Family History  Problem Relation Age of Onset  . Ulcerative colitis Mother   . Hypertension Mother   . Diabetes Father   . Heart attack Father   . Hypertension Father   . Diabetes Sister   . Thyroid disease Sister   . Heart attack Maternal Grandmother   . Stroke Maternal Grandmother     BP 124/84 (BP Location: Left Arm, Patient Position: Sitting, Cuff Size: Normal)   Pulse 81   Ht 5\' 4"  (1.626 m)   Wt 178 lb 3.2 oz (80.8 kg)   SpO2 95%   BMI 30.59 kg/m    Review of Systems Denies fever.      Objective:   Physical Exam VITAL SIGNS:  See vs page GENERAL: no distress NECK: There is no palpable thyroid enlargement.  No thyroid nodule is palpable.  No palpable lymphadenopathy at the anterior neck.   Lab Results   Component Value Date   TSH 0.655 09/12/2018   T4TOTAL 12.3 (H) 10/06/2016       Assessment & Plan:  Hyperthyroidism: well-controlled.  Nodular thyroid: nonpalpable. All she need for this is PE of the thyroid.   Patient Instructions  Please continue the same medications. If ever you have fever while taking methimazole, stop it and call us, even if the reason is obvious, because of the risk of a rare side-effect. Please come back for a follow-up appointment in 6-12 months.

## 2018-09-22 NOTE — Patient Instructions (Addendum)
Please continue the same medications. If ever you have fever while taking methimazole, stop it and call us, even if the reason is obvious, because of the risk of a rare side-effect. Please come back for a follow-up appointment in 6-12 months.

## 2018-10-12 ENCOUNTER — Other Ambulatory Visit: Payer: Self-pay | Admitting: Endocrinology

## 2018-10-17 ENCOUNTER — Other Ambulatory Visit (HOSPITAL_COMMUNITY)
Admission: RE | Admit: 2018-10-17 | Discharge: 2018-10-17 | Disposition: A | Discharge status: Home / Self Care | Payer: Medicaid Other | Source: Ambulatory Visit | Attending: Obstetrics & Gynecology | Admitting: Obstetrics & Gynecology

## 2018-10-17 ENCOUNTER — Other Ambulatory Visit: Payer: Self-pay

## 2018-10-17 ENCOUNTER — Ambulatory Visit: Payer: Medicaid Other | Admitting: Obstetrics & Gynecology

## 2018-10-17 ENCOUNTER — Encounter: Payer: Self-pay | Admitting: Obstetrics & Gynecology

## 2018-10-17 VITALS — BP 131/77 | HR 88 | Ht 64.0 in | Wt 180.8 lb

## 2018-10-17 DIAGNOSIS — Z01419 Encounter for gynecological examination (general) (routine) without abnormal findings: Secondary | ICD-10-CM

## 2018-10-17 DIAGNOSIS — Z0000 Encounter for general adult medical examination without abnormal findings: Secondary | ICD-10-CM | POA: Diagnosis not present

## 2018-10-17 NOTE — Progress Notes (Signed)
Subjective:    Sue [name] is a 43 y.o. engaged P4 (15, 29, 11, and 63 yo kids) female who presents for an annual exam. The patient has no complaints today. Her fiance has a vasectomy. She takes OCPs but wants to stop them. Her periods were heavy without the pills. The patient is sexually active. GYN screening history: last pap: was normal. The patient wears seatbelts: yes. The patient participates in regular exercise: yes. Has the patient ever been transfused or tattooed?: yes. The patient reports that there is not domestic violence in her life.   Menstrual History: OB History    Gravida  7   Para  4   Term  3   Preterm  1   AB  3   Living  4     SAB  3   TAB      Ectopic      Multiple  0   Live Births  76           Menarche age: 38 Patient's last menstrual period was 09/16/2018 (approximate).    The following portions of the patient's history were reviewed and updated as appropriate: allergies, current medications, past family history, past medical history, past social history, past surgical history and problem list.  Review of Systems Pertinent items are noted in HPI.   FH- no breast/gyn/colon cancer Works [employer]  Objective:    BP 131/77   Pulse 88   Ht 5\' 4"  (1.626 m)   Wt 180 lb 12.8 oz (82 kg)   LMP 09/16/2018 (Approximate)   Breastfeeding No   BMI 31.03 kg/m   General Appearance:    Alert, cooperative, no distress, appears stated age  Head:    Normocephalic, without obvious abnormality, atraumatic  Eyes:    PERRL, conjunctiva/corneas clear, EOM's intact, fundi    benign, both eyes  Ears:    Normal TM's and external ear canals, both ears  Nose:   Nares normal, septum midline, mucosa normal, no drainage    or sinus tenderness  Throat:   Lips, mucosa, and tongue normal; teeth and gums normal  Neck:   Supple, symmetrical, trachea midline, no adenopathy;    thyroid:  no enlargement/tenderness/nodules; no carotid   bruit or JVD  Back:     Symmetric, no  curvature, ROM normal, no CVA tenderness  Lungs:     Clear to auscultation bilaterally, respirations unlabored  Chest Wall:    No tenderness or deformity   Heart:    Regular rate and rhythm, S1 and S2 normal, no murmur, rub   or gallop  Breast Exam:    No tenderness, masses, or nipple abnormality  Abdomen:     Soft, non-tender, bowel sounds active all four quadrants,    no masses, no organomegaly  Genitalia:    Normal female without lesion, discharge or tenderness, normal size and shape, anteverted, mobile, non-tender, normal adnexal exam      Extremities:   Extremities normal, atraumatic, no cyanosis or edema  Pulses:   2+ and symmetric all extremities  Skin:   Skin color, texture, turgor normal, no rashes or lesions  Lymph nodes:   Cervical, supraclavicular, and axillary nodes normal  Neurologic:   CNII-XII intact, normal strength, sensation and reflexes    throughout  .    Assessment:    Healthy female exam.    Plan:     Thin prep Pap smear. with cotesting

## 2018-10-17 NOTE — Progress Notes (Signed)
Pt is here for annual gyn exam. Last pap 04/27/2017 normal. Nortrel pills for birth control. Pt is due for mammogram.

## 2018-10-19 LAB — CYTOLOGY - PAP
Diagnosis: NEGATIVE
HPV: NOT DETECTED

## 2018-10-24 ENCOUNTER — Other Ambulatory Visit: Payer: Self-pay

## 2018-10-24 DIAGNOSIS — I10 Essential (primary) hypertension: Secondary | ICD-10-CM

## 2018-10-24 NOTE — Telephone Encounter (Signed)
amLODipine (NORVASC) 10 MG tablet, REFILL REQUEST @  [id number] - [place], [place] - [address] [phone] (Phone) [phone] (Fax)    

## 2018-10-25 ENCOUNTER — Encounter (HOSPITAL_BASED_OUTPATIENT_CLINIC_OR_DEPARTMENT_OTHER): Payer: Self-pay | Admitting: Emergency Medicine

## 2018-10-25 ENCOUNTER — Encounter (HOSPITAL_COMMUNITY): Payer: Self-pay | Admitting: *Deleted

## 2018-10-25 ENCOUNTER — Emergency Department (HOSPITAL_BASED_OUTPATIENT_CLINIC_OR_DEPARTMENT_OTHER)
Admission: EM | Admit: 2018-10-25 | Discharge: 2018-10-25 | Disposition: A | Discharge status: Home / Self Care | Payer: Medicaid Other | Source: Home / Self Care | Attending: Emergency Medicine | Admitting: Emergency Medicine

## 2018-10-25 ENCOUNTER — Emergency Department (HOSPITAL_COMMUNITY)
Admission: EM | Admit: 2018-10-25 | Discharge: 2018-10-25 | Disposition: A | Discharge status: Home / Self Care | Payer: Medicaid Other | Attending: Emergency Medicine | Admitting: Emergency Medicine

## 2018-10-25 ENCOUNTER — Emergency Department (HOSPITAL_BASED_OUTPATIENT_CLINIC_OR_DEPARTMENT_OTHER): Payer: Medicaid Other

## 2018-10-25 ENCOUNTER — Other Ambulatory Visit: Payer: Self-pay

## 2018-10-25 DIAGNOSIS — R1012 Left upper quadrant pain: Secondary | ICD-10-CM

## 2018-10-25 DIAGNOSIS — R109 Unspecified abdominal pain: Secondary | ICD-10-CM | POA: Insufficient documentation

## 2018-10-25 DIAGNOSIS — I10 Essential (primary) hypertension: Secondary | ICD-10-CM | POA: Insufficient documentation

## 2018-10-25 DIAGNOSIS — Z79899 Other long term (current) drug therapy: Secondary | ICD-10-CM | POA: Insufficient documentation

## 2018-10-25 DIAGNOSIS — R1033 Periumbilical pain: Secondary | ICD-10-CM | POA: Diagnosis present

## 2018-10-25 LAB — COMPREHENSIVE METABOLIC PANEL
ALT: 23 U/L (ref 0–44)
AST: 35 U/L (ref 15–41)
Albumin: 3.7 g/dL (ref 3.5–5.0)
Alkaline Phosphatase: 125 U/L (ref 38–126)
Anion gap: 12 (ref 5–15)
BUN: 11 mg/dL (ref 6–20)
CO2: 22 mmol/L (ref 22–32)
Calcium: 8.9 mg/dL (ref 8.9–10.3)
Chloride: 103 mmol/L (ref 98–111)
Creatinine, Ser: 0.73 mg/dL (ref 0.44–1.00)
GFR calc Af Amer: >60 mL/min (ref >60)
GFR calc non Af Amer: >60 mL/min (ref >60)
Glucose, Bld: 108 mg/dL — ABNORMAL HIGH (ref 70–99)
Potassium: 4.2 mmol/L (ref 3.5–5.1)
Sodium: 137 mmol/L (ref 135–145)
Total Bilirubin: 0.9 mg/dL (ref 0.3–1.2)
Total Protein: 7.4 g/dL (ref 6.5–8.1)

## 2018-10-25 LAB — CBC
HCT: 43 % (ref 36.0–46.0)
Hemoglobin: 14.4 g/dL (ref 12.0–15.0)
MCH: 29.3 pg (ref 26.0–34.0)
MCHC: 33.5 g/dL (ref 30.0–36.0)
MCV: 87.6 fL (ref 80.0–100.0)
Platelets: 419 10*3/uL — ABNORMAL HIGH (ref 150–400)
RBC: 4.91 MIL/uL (ref 3.87–5.11)
RDW: 14.3 % (ref 11.5–15.5)
WBC: 7.6 10*3/uL (ref 4.0–10.5)
nRBC: 0 % (ref 0.0–0.2)

## 2018-10-25 LAB — I-STAT BETA HCG BLOOD, ED (MC, WL, AP ONLY): I-stat hCG, quantitative: <5 m[IU]/mL (ref <5)

## 2018-10-25 LAB — LIPASE, BLOOD: Lipase: 28 U/L (ref 11–51)

## 2018-10-25 LAB — URINALYSIS, ROUTINE W REFLEX MICROSCOPIC
Bacteria, UA: NONE SEEN
Bilirubin Urine: NEGATIVE
Glucose, UA: NEGATIVE mg/dL
Ketones, ur: NEGATIVE mg/dL
Leukocytes,Ua: NEGATIVE
Nitrite: NEGATIVE
Protein, ur: NEGATIVE mg/dL
Specific Gravity, Urine: 1.011 (ref 1.005–1.030)
pH: 8 (ref 5.0–8.0)

## 2018-10-25 MED ORDER — IOHEXOL 300 MG/ML  SOLN
100.0000 mL | Freq: Once | INTRAMUSCULAR | Status: AC | PRN
  Administered 2018-10-25: 100 mL via INTRAVENOUS

## 2018-10-25 MED ORDER — KETOROLAC TROMETHAMINE 15 MG/ML IJ SOLN
15.0000 mg | Freq: Once | INTRAMUSCULAR | Status: AC
  Administered 2018-10-25: 15 mg via INTRAVENOUS
  Filled 2018-10-25: qty 1

## 2018-10-25 MED ORDER — ONDANSETRON HCL 4 MG PO TABS: 4.0000 mg | ORAL_TABLET | Freq: Three times a day (TID) | ORAL | 0 refills | Status: DC | PRN

## 2018-10-25 MED ORDER — SODIUM CHLORIDE 0.9% FLUSH: 3.0000 mL | Freq: Once | INTRAVENOUS | Status: DC

## 2018-10-25 MED ORDER — AMLODIPINE BESYLATE 10 MG PO TABS: 10.0000 mg | ORAL_TABLET | Freq: Every day | ORAL | 1 refills | Status: DC

## 2018-10-25 MED ORDER — MORPHINE SULFATE (PF) 4 MG/ML IV SOLN
4.0000 mg | Freq: Once | INTRAVENOUS | Status: AC
  Administered 2018-10-25: 4 mg via INTRAVENOUS
  Filled 2018-10-25: qty 1

## 2018-10-25 MED ORDER — ONDANSETRON HCL 4 MG/2ML IJ SOLN
4.0000 mg | Freq: Once | INTRAMUSCULAR | Status: AC
  Administered 2018-10-25: 4 mg via INTRAVENOUS
  Filled 2018-10-25: qty 2

## 2018-10-25 MED ORDER — NAPROXEN 500 MG PO TABS: 500.0000 mg | ORAL_TABLET | Freq: Two times a day (BID) | ORAL | 0 refills | Status: DC | PRN

## 2018-10-25 NOTE — ED Triage Notes (Signed)
Pt c/o umbilical pain since yesterday; went to [facility] ED earlier this morning, but left d/t wait; denies other sxs

## 2018-10-25 NOTE — ED Triage Notes (Signed)
Pt has been having abd pain, pulling sensation from naval inward and diagonal across abd. Only having a small bm; reports nausea

## 2018-10-25 NOTE — Discharge Instructions (Addendum)
Please schedule follow-up appointment with your primary doctor for recheck regarding the symptoms you are having today.  Recommend taking the pain and nausea medicine as directed as needed.  If you feel your pain is worsening you develop vomiting, fevers or other new concerning symptoms please return to the ER for reassessment.  When you next see your primary doctor please discuss the following recommendation from the radiologist - "Prominence of the distal appendix measuring up to 11 mm in diameter. Three month CT abdomen/pelvis follow-up is recommended to exclude a lesion/mass at this site. "

## 2018-10-25 NOTE — ED Notes (Signed)
Pt called in waiting room with no answer.  

## 2018-10-25 NOTE — ED Notes (Signed)
Pt called for room with no reply.

## 2018-10-25 NOTE — ED Provider Notes (Signed)
[facility] EMERGENCY DEPARTMENT Provider Note   CSN: [id number] Arrival date & time: 10/25/18  1010    History   Chief Complaint Chief Complaint  Patient presents with   Abdominal Pain    HPI Sue [name] is a 43 y.o. female.  Presents the prescriber with a chief complaint of abdominal pain.  Patient reports pain is left upper quadrant, left lower quadrant in location.  Severe, sharp, stabbing pain.  Seems to come and go in waves.  No alleviating or aggravating factors.  Has not taken any medication for this.  Went to [facility] initially but left due to wait time.  Has not had any vomiting, no diarrhea.  Has had some nausea.  No fever, chest pain, difficulty breathing.     HPI  Past Medical History:  Diagnosis Date   Asthma    mild by PFT's 03/16/1998   Cervical lymphadenopathy    biopsy negativeMarvetta Gibbons)   Chronic hypertension    Complete spontaneous abortion    x2   GERD (gastroesophageal reflux disease)    History of abnormal Pap smear    s/p cryo( Dr. 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   LEEP       OB History    Gravida  7   Para  4   Term  3   Preterm  1   AB  3   Living  4     SAB  3   TAB      Ectopic      Multiple  0   Live Births  4            Home Medications    Prior to Admission medications   Medication Sig Start Date End Date Taking? Authorizing Provider  acetaminophen (TYLENOL) 500 MG tablet Take 1,000 mg by mouth every 6 (six) hours as needed for mild pain, moderate pain or headache.    [provider]  Adalimumab (HUMIRA Marathon) Inject into the skin.    [provider]  albuterol (PROVENTIL HFA;VENTOLIN HFA) 108 (90 Base) MCG/ACT inhaler Inhale 1-2 puffs into the lungs every 6 (six) hours as needed for wheezing or shortness of breath. 06/07/18   Geralyn Corwin Ratliff, DO  amLODipine (NORVASC) 10 MG tablet Take 1 tablet (10 mg total) by mouth daily. 10/25/18   Dolan Amen, MD  calcium carbonate (TUMS - DOSED IN MG ELEMENTAL CALCIUM) 500 MG chewable tablet Chew 1 tablet by mouth daily as needed for heartburn.     [provider]  Chlorpheniramine-DM (CORICIDIN HBP COUGH/COLD PO) Take 1 tablet by mouth daily as needed (for cough).     [provider]  cyanocobalamin (,VITAMIN B-12,) 1000 MCG/ML injection INJECT 1 ML INTO THE MUSCLE ONCE A WEEK 09/13/18   Inez Catalina, MD  levocetirizine (XYZAL) 5 MG tablet TAKE 1 TABLET(5 MG) BY MOUTH EVERY EVENING Patient not taking: Reported on 10/17/2018 01/07/18   Dorrell, Cathleen Corti, MD  mesalamine (CANASA) 1000 MG suppository Place 1,000 mg rectally as needed (Ulcerative Proctitis).     [provider]  methimazole (TAPAZOLE) 5 MG tablet TAKE 1 TABLET(5 MG) BY MOUTH DAILY 10/13/18   Romero Belling, MD  MULTIPLE VITAMIN PO Take 1 tablet  by mouth daily.    [provider]  naproxen (NAPROSYN) 500 MG tablet Take 1 tablet (500 mg total) by mouth 2 (two) times daily as needed. 10/25/18   Milagros Loll, MD  NORTREL 1/35, 28, tablet TAKE 1 TABLET BY MOUTH DAILY 11/17/17   Brock Bad, MD  ondansetron (ZOFRAN) 4 MG tablet Take 1 tablet (4 mg total) by mouth every 8 (eight) hours as needed for nausea or vomiting. 10/25/18   Milagros Loll, MD  pantoprazole (PROTONIX) 40 MG tablet Take 1 tablet (40 mg total) by mouth daily. 01/07/18   Dorrell, Cathleen Corti, MD  valACYclovir (VALTREX) 500 MG tablet TAKE 1 TABLET BY MOUTH ONCE DAILY 06/07/18   Adam Phenix, MD    Family History Family History  Problem Relation Age of Onset   Ulcerative colitis Mother    Hypertension Mother    Diabetes Father    Heart attack Father    Hypertension Father    Diabetes Sister    Thyroid disease Sister    Heart attack Maternal Grandmother    Stroke Maternal Grandmother     Social History Social History   Tobacco Use   Smoking status: Never Smoker   Smokeless tobacco: Never Used  Substance Use Topics   Alcohol use: Yes    Alcohol/week: 0.0 standard drinks    Comment: Sometimes.   Drug use: No     Allergies   Ciprofloxacin, Clindamycin/lincomycin, Sulfamethoxazole-trimethoprim, and Amoxicillin   Review of Systems Review of Systems  Constitutional: Negative for chills and fever.  HENT: Negative for ear pain and sore throat.   Eyes: Negative for pain and visual disturbance.  Respiratory: Negative for cough and shortness of breath.   Cardiovascular: Negative for chest pain and palpitations.  Gastrointestinal: Positive for abdominal pain. Negative for vomiting.  Genitourinary: Negative for dysuria and hematuria.  Musculoskeletal: Negative for arthralgias and back pain.  Skin: Negative for color change and rash.  Neurological: Negative for seizures and syncope.  All other systems reviewed and are  negative.    Physical Exam Updated Vital Signs BP 123/88 (BP Location: Right Arm)    Pulse 88    Temp 98.5 F (36.9 C) (Oral)    Resp 18    Ht 5\' 4"  (1.626 m)    Wt 81.6 kg    LMP 10/17/2018    SpO2 100%    BMI 30.90 kg/m   Physical Exam Vitals signs and nursing note reviewed.  Constitutional:      General: She is not in acute distress.    Appearance: She is well-developed.  HENT:  Head: Normocephalic and atraumatic.  Eyes:     Conjunctiva/sclera: Conjunctivae normal.  Neck:     Musculoskeletal: Neck supple.  Cardiovascular:     Rate and Rhythm: Normal rate and regular rhythm.     Heart sounds: No murmur.  Pulmonary:     Effort: Pulmonary effort is normal. No respiratory distress.     Breath sounds: Normal breath sounds.  Abdominal:     General: Bowel sounds are normal.     Palpations: Abdomen is soft.     Tenderness: There is abdominal tenderness in the left upper quadrant and left lower quadrant.  Skin:    General: Skin is warm and dry.  Neurological:     Mental Status: She is alert.      ED Treatments / Results  Labs (all labs ordered are listed, but only abnormal results are displayed) Labs Reviewed - No data to display  EKG None  Radiology Ct Abdomen Pelvis W Contrast  Result Date: 10/25/2018 CLINICAL DATA:  Abdominal pain, tenderness to palpation in left lower quadrant, left upper quadrant, concern for diverticulitis/abscess or other acute abdominal process. EXAM: CT ABDOMEN AND PELVIS WITH CONTRAST TECHNIQUE: Multidetector CT imaging of the abdomen and pelvis was performed using the standard protocol following bolus administration of intravenous contrast. CONTRAST:  154mL OMNIPAQUE IOHEXOL 300 MG/ML  SOLN COMPARISON:  CT abdomen/pelvis 01/02/2016 FINDINGS: Lower chest: The imaged lung bases are clear.The imaged heart is normal in size. Hepatobiliary: No evidence of focal liver lesion. Ill-defined hypoattenuation within large portions of the right hepatic  lobe consistent with steatosis.No biliary ductal dilatation. Pancreas: No ductal dilatation or peripancreatic edema. Spleen: No evidence of focal lesion.No splenomegaly. Incidentally noted small left upper quadrant splenules . Adrenals/Urinary Tract: No adrenal gland mass.No evidence of renal mass.No hydronephrosis.Symmetric renal enhancement.Under distended bladder. Stomach/Bowel: Hiatal hernia.No dilated loops of bowel or bowel wall thickening.Scattered colonic diverticulosis without convincing CT evidence of diverticulitis. The tip of the appendix is prominent, measuring up to 11 mm in diameter. No periappendiceal inflammatory changes to suggest acute appendicitis. Vascular/Lymphatic: No abdominal aortic aneurysm. Reproductive: Incompletely assessed by CT modality.No pathologic findings. Other: No ascites. Small fat containing umbilical hernia. Musculoskeletal: No acute bony abnormality. IMPRESSION: Scattered colonic diverticulosis without convincing CT evidence of diverticulitis. Prominence of the distal appendix measuring up to 11 mm in diameter. Three month CT abdomen/pelvis follow-up is recommended to exclude a lesion/mass at this site. Tip appendicitis is not considered likely given the provided history and lack of periappendiceal inflammatory changes. Small hiatal hernia. Small fat containing umbilical hernia. Hepatic steatosis. Electronically Signed   By: Kellie Simmering   On: 10/25/2018 12:03    Procedures Procedures (including critical care time)  Medications Ordered in ED Medications  ondansetron [facility]) injection 4 mg (4 mg Intravenous Given 10/25/18 1117)  morphine 4 MG/ML injection 4 mg (4 mg Intravenous Given 10/25/18 1117)  iohexol (OMNIPAQUE) 300 MG/ML solution 100 mL (100 mLs Intravenous Contrast Given 10/25/18 1121)  ketorolac (TORADOL) 15 MG/ML injection 15 mg (15 mg Intravenous Given 10/25/18 1240)     Initial Impression / Assessment and Plan / ED Course  I have reviewed the triage  vital signs and the nursing notes.  Pertinent labs & imaging results that were available during my care of the patient were reviewed by me and considered in my medical decision making (see chart for details).       43 year old lady presented to the ED with primarily epigastric, left upper quadrant and some left lower quadrant  abdominal pain.  [facility] was within normal limits, no leukocytosis, normal electrolytes.  No UTI on urinalysis.  Checked CT scan to further evaluate, no acute abdominal process was identified.  Suspect symptoms may be related to gastritis or gastroenteritis.  Incidentally noted on CT scan was mild enlargement of appendix.  Suspect this is incidental finding given no periappendiceal inflammatory changes, no right upper quadrant abdominal pain.  Recommended recheck with PCP regarding symptoms she is having today as well as this finding on CT scan.  Reviewed strict return precautions.    After the discussed management above, the patient was determined to be safe for discharge.  The patient was in agreement with this plan and all questions regarding their care were answered.  ED return precautions were discussed and the patient will return to the ED with any significant worsening of condition.    Final Clinical Impressions(s) / ED Diagnoses   Final diagnoses:  Abdominal pain, left upper quadrant    ED Discharge Orders         Ordered    ondansetron (ZOFRAN) 4 MG tablet  Every 8 hours PRN     10/25/18 1231    naproxen (NAPROSYN) 500 MG tablet  2 times daily PRN     10/25/18 1231           Milagros Lollykstra, Shaye Elling S, MD 10/25/18 1536

## 2018-10-27 DIAGNOSIS — K625 Hemorrhage of anus and rectum: Secondary | ICD-10-CM | POA: Diagnosis not present

## 2018-10-27 DIAGNOSIS — R1033 Periumbilical pain: Secondary | ICD-10-CM | POA: Diagnosis not present

## 2018-11-22 DIAGNOSIS — K51211 Ulcerative (chronic) proctitis with rectal bleeding: Secondary | ICD-10-CM | POA: Diagnosis not present

## 2018-11-22 DIAGNOSIS — K625 Hemorrhage of anus and rectum: Secondary | ICD-10-CM | POA: Diagnosis not present

## 2018-11-22 DIAGNOSIS — R1033 Periumbilical pain: Secondary | ICD-10-CM | POA: Diagnosis not present

## 2018-12-02 ENCOUNTER — Ambulatory Visit
Admission: RE | Admit: 2018-12-02 | Discharge: 2018-12-02 | Disposition: A | Discharge status: Home / Self Care | Payer: Medicaid Other | Source: Ambulatory Visit | Attending: Obstetrics & Gynecology | Admitting: Obstetrics & Gynecology

## 2018-12-02 ENCOUNTER — Other Ambulatory Visit: Payer: Self-pay

## 2018-12-02 DIAGNOSIS — Z01419 Encounter for gynecological examination (general) (routine) without abnormal findings: Secondary | ICD-10-CM

## 2018-12-02 DIAGNOSIS — Z1231 Encounter for screening mammogram for malignant neoplasm of breast: Secondary | ICD-10-CM | POA: Diagnosis not present

## 2019-01-09 ENCOUNTER — Other Ambulatory Visit: Payer: Self-pay | Admitting: Obstetrics

## 2019-01-09 DIAGNOSIS — N939 Abnormal uterine and vaginal bleeding, unspecified: Secondary | ICD-10-CM

## 2019-01-17 DIAGNOSIS — R1030 Lower abdominal pain, unspecified: Secondary | ICD-10-CM | POA: Diagnosis not present

## 2019-01-17 DIAGNOSIS — R935 Abnormal findings on diagnostic imaging of other abdominal regions, including retroperitoneum: Secondary | ICD-10-CM | POA: Diagnosis not present

## 2019-01-17 DIAGNOSIS — K5130 Ulcerative (chronic) rectosigmoiditis without complications: Secondary | ICD-10-CM | POA: Diagnosis not present

## 2019-01-17 DIAGNOSIS — R102 Pelvic and perineal pain: Secondary | ICD-10-CM | POA: Diagnosis not present

## 2019-01-18 ENCOUNTER — Other Ambulatory Visit: Payer: Self-pay | Admitting: Gastroenterology

## 2019-01-18 DIAGNOSIS — R935 Abnormal findings on diagnostic imaging of other abdominal regions, including retroperitoneum: Secondary | ICD-10-CM

## 2019-01-18 DIAGNOSIS — R1032 Left lower quadrant pain: Secondary | ICD-10-CM | POA: Diagnosis not present

## 2019-01-18 DIAGNOSIS — R1030 Lower abdominal pain, unspecified: Secondary | ICD-10-CM

## 2019-01-18 DIAGNOSIS — R102 Pelvic and perineal pain: Secondary | ICD-10-CM | POA: Diagnosis not present

## 2019-01-25 DIAGNOSIS — H20022 Recurrent acute iridocyclitis, left eye: Secondary | ICD-10-CM | POA: Diagnosis not present

## 2019-01-25 DIAGNOSIS — H20021 Recurrent acute iridocyclitis, right eye: Secondary | ICD-10-CM | POA: Diagnosis not present

## 2019-01-26 ENCOUNTER — Telehealth: Payer: Self-pay | Admitting: Internal Medicine

## 2019-01-26 ENCOUNTER — Encounter: Payer: Medicaid Other | Admitting: Internal Medicine

## 2019-01-26 ENCOUNTER — Ambulatory Visit
Admission: RE | Admit: 2019-01-26 | Discharge: 2019-01-26 | Disposition: A | Discharge status: Home / Self Care | Payer: Medicaid Other | Source: Ambulatory Visit | Attending: Gastroenterology | Admitting: Gastroenterology

## 2019-01-26 DIAGNOSIS — R935 Abnormal findings on diagnostic imaging of other abdominal regions, including retroperitoneum: Secondary | ICD-10-CM

## 2019-01-26 DIAGNOSIS — Z882 Allergy status to sulfonamides status: Secondary | ICD-10-CM | POA: Diagnosis not present

## 2019-01-26 DIAGNOSIS — N854 Malposition of uterus: Secondary | ICD-10-CM | POA: Diagnosis not present

## 2019-01-26 DIAGNOSIS — M129 Arthropathy, unspecified: Secondary | ICD-10-CM | POA: Diagnosis not present

## 2019-01-26 DIAGNOSIS — M7989 Other specified soft tissue disorders: Secondary | ICD-10-CM | POA: Diagnosis not present

## 2019-01-26 DIAGNOSIS — K5120 Ulcerative (chronic) proctitis without complications: Secondary | ICD-10-CM | POA: Diagnosis not present

## 2019-01-26 DIAGNOSIS — K820 Obstruction of gallbladder: Secondary | ICD-10-CM | POA: Diagnosis not present

## 2019-01-26 DIAGNOSIS — Z880 Allergy status to penicillin: Secondary | ICD-10-CM | POA: Diagnosis not present

## 2019-01-26 DIAGNOSIS — K529 Noninfective gastroenteritis and colitis, unspecified: Secondary | ICD-10-CM | POA: Diagnosis not present

## 2019-01-26 DIAGNOSIS — Z881 Allergy status to other antibiotic agents status: Secondary | ICD-10-CM | POA: Diagnosis not present

## 2019-01-26 DIAGNOSIS — Z79899 Other long term (current) drug therapy: Secondary | ICD-10-CM | POA: Diagnosis not present

## 2019-01-26 DIAGNOSIS — H209 Unspecified iridocyclitis: Secondary | ICD-10-CM | POA: Diagnosis not present

## 2019-01-26 DIAGNOSIS — R1030 Lower abdominal pain, unspecified: Secondary | ICD-10-CM

## 2019-01-26 DIAGNOSIS — M0760 Enteropathic arthropathies, unspecified site: Secondary | ICD-10-CM | POA: Diagnosis not present

## 2019-01-26 DIAGNOSIS — R21 Rash and other nonspecific skin eruption: Secondary | ICD-10-CM

## 2019-01-26 DIAGNOSIS — M25562 Pain in left knee: Secondary | ICD-10-CM | POA: Diagnosis not present

## 2019-01-26 DIAGNOSIS — M25512 Pain in left shoulder: Secondary | ICD-10-CM | POA: Diagnosis not present

## 2019-01-26 DIAGNOSIS — M898X8 Other specified disorders of bone, other site: Secondary | ICD-10-CM | POA: Diagnosis not present

## 2019-01-26 MED ORDER — IOPAMIDOL (ISOVUE-300) INJECTION 61%
100.0000 mL | Freq: Once | INTRAVENOUS | Status: AC | PRN
  Administered 2019-01-26: 100 mL via INTRAVENOUS

## 2019-01-26 NOTE — Telephone Encounter (Signed)
Pt was seen today by her Rheumatologist Dr. Rogers Blocker @ [facility].   Per the patient she has [facility] and she will need referral from her Primary Care Physician to a Dermatologist for a Rash.  Please advise.

## 2019-01-27 NOTE — Telephone Encounter (Signed)
Thank you for reaching out. Patient should be seen in the [facility] for an initial assessment before a referral out to Dermatology. Sincerely,  Maudie Mercury, MD

## 2019-01-27 NOTE — Telephone Encounter (Signed)
Please Advise Per Patient if an appointment is needed. Pt was already seen 01/26/2019 notes are in [facility]) from Dr. Rogers Blocker.  Their office is only needing [facility] PCP to send the Referral due to her Medicaid [place] Access as their office does not have access.

## 2019-01-30 NOTE — Telephone Encounter (Signed)
Referral has been sent to [facility].

## 2019-02-07 ENCOUNTER — Ambulatory Visit: Payer: Medicaid Other

## 2019-02-09 DIAGNOSIS — R935 Abnormal findings on diagnostic imaging of other abdominal regions, including retroperitoneum: Secondary | ICD-10-CM | POA: Diagnosis not present

## 2019-02-09 DIAGNOSIS — K5130 Ulcerative (chronic) rectosigmoiditis without complications: Secondary | ICD-10-CM | POA: Diagnosis not present

## 2019-02-09 DIAGNOSIS — R1032 Left lower quadrant pain: Secondary | ICD-10-CM | POA: Diagnosis not present

## 2019-02-13 ENCOUNTER — Other Ambulatory Visit: Payer: Self-pay

## 2019-02-13 ENCOUNTER — Ambulatory Visit: Payer: Medicaid Other | Admitting: Internal Medicine

## 2019-02-13 ENCOUNTER — Encounter: Payer: Self-pay | Admitting: Internal Medicine

## 2019-02-13 DIAGNOSIS — Z79899 Other long term (current) drug therapy: Secondary | ICD-10-CM | POA: Diagnosis not present

## 2019-02-13 DIAGNOSIS — M940 Chondrocostal junction syndrome [Tietze]: Secondary | ICD-10-CM

## 2019-02-13 DIAGNOSIS — R21 Rash and other nonspecific skin eruption: Secondary | ICD-10-CM | POA: Diagnosis not present

## 2019-02-13 DIAGNOSIS — Z8719 Personal history of other diseases of the digestive system: Secondary | ICD-10-CM

## 2019-02-13 NOTE — Patient Instructions (Signed)
FOLLOW-UP INSTRUCTIONS When: As needed or for a routine visit with you PCP  What to bring: All of your medications  I have not made any changes to your medications today.   Today we discussed the rash on the palms of your hands. Please follow-up with dermatology for this. A referral has been placed. As it is improving I do not think we need to treat it with topical steroids. If the rash returns please let us know ASAP.  For the pain with deep breathing in the right side of your chest this appears to be costochondritis or pain from inflammation or injury of a rib joint or surrounding area. Please look for signs of a more ominous process such as chest pain that is associated with activity and better with rest, associated with sweating, radiating to your neck or shoulder, or occurs at rest.  Please notify us of any such symptoms.  Given the limitations on NSAID use, I recommend application of topical creams such as capsaicin-based creams and applying heat.  Thank you for your visit to the [facility] today. If you have any questions or concerns please call us at [phone].

## 2019-02-13 NOTE — Progress Notes (Signed)
   CC: Rash on the hands  HPI:[name] is a 43 y.o. female who presents for evaluation of a rash on her hands for proxy 1 month. Please see individual problem based A/P for details.  Past Medical History:  Diagnosis Date  . Asthma    mild by PFT's 03/16/1998  . Cervical lymphadenopathy    biopsy negativeGwenlyn Saran)  . Chronic hypertension   . Complete spontaneous abortion    x2  . GERD (gastroesophageal reflux disease)   . History of abnormal Pap smear    s/p cryo( Dr. Alferd Apa)  . HSV (herpes simplex virus) anogenital infection   . Hypertension   . Kidney stone   . Normal vaginal delivery    x3  . Rheumatoid arthritis ([place])   . Thyroid disease affecting pregnancy   . 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   . Vaginal Pap smear, abnormal   . Vitamin B12 deficiency    Review of Systems:  ROS negative except as per HPI.  Physical Exam: Vitals:   02/13/19 1437  BP: 138/85  Pulse: 79  Temp: 98.9 F (37.2 C)  TempSrc: Oral  SpO2: 100%  Weight: 183 lb 14.4 oz (83.4 kg)  Height: 5\' 4"  (1.626 m)   General: A/O x4, in no acute distress, afebrile, nondiaphoretic HEENT: PEERL, EMO intact Cardio: RRR, no mrg's  Pulmonary: CTA bilaterally, no wheezing or crackles  Abdomen: Bowel sounds normal, soft, nontender  MSK: BLE nontender, nonedematous, tenderness to palpation of the fourth costosternal joint Skin: See A/P for rash    Psych: Appropriate affect, not depressed in appearance, engages well  Assessment & Plan:   See Encounters Tab for problem based charting.  Patient discussed with Dr. Angelia Mould

## 2019-02-14 ENCOUNTER — Encounter: Payer: Self-pay | Admitting: Internal Medicine

## 2019-02-14 DIAGNOSIS — R21 Rash and other nonspecific skin eruption: Secondary | ICD-10-CM | POA: Insufficient documentation

## 2019-02-14 DIAGNOSIS — M940 Chondrocostal junction syndrome [Tietze]: Secondary | ICD-10-CM | POA: Insufficient documentation

## 2019-02-14 NOTE — Assessment & Plan Note (Signed)
Costochondritis: The patient endorses right sternal rib pain most notable with deep breaths utilization of the right arm with extension of the pectoralis major muscle.  This pain is focal, there is tenderness to palpation of the right fourth costosternal junction.  She has not been able to utilize NSAIDs due to her history of ulcerative proctitis.  The pain is mild, well-tolerated and not associated with exercise, climbing stairs, diaphoresis, radiation, or other concerning feature.  She has tried Voltaren gel which makes her dizzy.  Plan:  Continue Humira Consider capsaicin cream application Consider heating pad

## 2019-02-14 NOTE — Assessment & Plan Note (Signed)
Rash: Patient states that approximately 1 month ago she began to develop a rash on the palms of her hands.  This is more prominent on the left than the right.  There is an area of approximately 8 x 7 cm on the left palm/wrist (see image) and a small singular macule on the right overlying the skin of the thenar muscle.  The rash began as raised blisterlike eruption progressed to plaque-like lesions that has since begun to resolve.  They are not described as hyperkeratotic, hyper melanotic but were pruritic in nature. 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  She intermittently applied clotrimazole cream and hydrocortisone cream but each once daily.  She has a history of ulcerative proctitis, iritis and inflammatory arthritis for which she is currently on Humira 40 mg weekly.  She denies missing a dose of her medication. Etiology uncertain, feel this is related to her underlying autoimmune disorder and could be manifestation of such.   Plan: Patient has been referred to dermatology which feels she should keep The rash is improving nearly resolved Consider topical hydrocortisone or betamethasone cream the rash returns Consider biopsy if the rash remains persistent and a diagnosis is otherwise not made

## 2019-02-15 NOTE — Progress Notes (Signed)
Internal Medicine Clinic Attending  Case discussed with Dr. Harbrecht  soon after the resident saw the patient.  We reviewed the resident's history and exam and pertinent patient test results.  I agree with the assessment, diagnosis, and plan of care documented in the resident's note.  

## 2019-02-16 ENCOUNTER — Other Ambulatory Visit: Payer: Self-pay

## 2019-02-16 ENCOUNTER — Encounter: Payer: Self-pay | Admitting: Obstetrics

## 2019-02-16 ENCOUNTER — Ambulatory Visit (INDEPENDENT_AMBULATORY_CARE_PROVIDER_SITE_OTHER): Payer: Medicaid Other | Admitting: Obstetrics

## 2019-02-16 ENCOUNTER — Other Ambulatory Visit (HOSPITAL_COMMUNITY)
Admission: RE | Admit: 2019-02-16 | Discharge: 2019-02-16 | Disposition: A | Discharge status: Home / Self Care | Payer: Medicaid Other | Source: Ambulatory Visit | Attending: Obstetrics | Admitting: Obstetrics

## 2019-02-16 VITALS — BP 120/80 | HR 78 | Ht 64.0 in | Wt 180.0 lb

## 2019-02-16 DIAGNOSIS — R102 Pelvic and perineal pain: Secondary | ICD-10-CM | POA: Diagnosis not present

## 2019-02-16 DIAGNOSIS — N898 Other specified noninflammatory disorders of vagina: Secondary | ICD-10-CM | POA: Insufficient documentation

## 2019-02-16 MED ORDER — OXYCODONE-ACETAMINOPHEN 5-325 MG PO TABS: 1.0000 | ORAL_TABLET | ORAL | 0 refills | Status: DC | PRN

## 2019-02-16 NOTE — Progress Notes (Signed)
Patient ID: Sue [name], female   DOB: 1975/06/23, 43 y.o.   MRN: [id number]  Chief Complaint  Patient presents with  . Gynecologic Exam    HPI Sue [name] is a 43 y.o. female.  Pelvic pain after periods for a few days.  This has been occurring for the past few months. HPI  Past Medical History:  Diagnosis Date  . Asthma    mild by PFT's 03/16/1998  . Cervical lymphadenopathy    biopsy negativeMarvetta Gibbons)  . Chronic hypertension   . Complete spontaneous abortion    x2  . GERD (gastroesophageal reflux disease)   . History of abnormal Pap smear    s/p cryo( Dr. Wiliam Ke)  . HSV (herpes simplex virus) anogenital infection   . Hypertension   . Kidney stone   . Normal vaginal delivery    x3  . Rheumatoid arthritis ([facility])   . Thyroid disease affecting pregnancy   . 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   . Vaginal Pap smear, abnormal   . Vitamin B12 deficiency     Past Surgical History:  Procedure Laterality Date  . biopsy of lymph node in R neck  2007.  Marland Kitchen LEEP      Family History  Problem Relation Age of Onset  . Ulcerative colitis Mother   . Hypertension Mother   . Diabetes Father   . Heart attack Father   . Hypertension Father   . Diabetes Sister   . Thyroid disease Sister   . Heart attack Maternal Grandmother   . Stroke Maternal Grandmother   . Breast cancer Neg Hx     Social History Social History   Tobacco Use  . Smoking status: Never Smoker  . Smokeless tobacco: Never Used  Substance Use Topics  . Alcohol use: Yes    Alcohol/week: 0.0 standard drinks    Comment: Sometimes.  . Drug use: No    Allergies  Allergen Reactions  . Ciprofloxacin Anaphylaxis and Swelling    Throat swells  . Clindamycin/Lincomycin Anaphylaxis and Swelling  . Sulfamethoxazole-Trimethoprim Swelling    Swelling is of the  lips. Swelling is of the lips.  . Amoxicillin Rash    Has patient had a PCN reaction causing immediate rash, facial/tongue/throat swelling, SOB or lightheadedness with hypotension: no Has patient had a PCN reaction causing severe rash involving mucus membranes or skin necrosis: pt had rash -- non severe  Has patient had a PCN reaction that required hospitalization: no Has patient had a PCN reaction occurring within the last 10 years: no If all of the above answers are "NO", then may proceed with Cephalosporin use.     Current Outpatient Medications  Medication Sig Dispense Refill  . acetaminophen (TYLENOL) 500 MG tablet Take 1,000 mg by mouth every 6 (six) hours as needed for mild pain, moderate pain or headache.    . Adalimumab (HUMIRA [place]) Inject into the skin.    Marland Kitchen albuterol (PROVENTIL HFA;VENTOLIN HFA) 108 (90 Base) MCG/ACT inhaler Inhale 1-2 puffs into the lungs every 6 (six) hours as needed for wheezing or shortness of breath. 1 Inhaler 1  . amLODipine (NORVASC) 10 MG tablet Take 1 tablet (10 mg total) by mouth daily. 90 tablet 1  . calcium carbonate (TUMS - DOSED IN MG ELEMENTAL CALCIUM) 500 MG chewable tablet Chew 1 tablet by mouth daily as needed for heartburn.     Marland Kitchen  Chlorpheniramine-DM (CORICIDIN HBP COUGH/COLD PO) Take 1 tablet by mouth daily as needed (for cough).     . famotidine (PEPCID) 10 MG tablet Take 10 mg by mouth 2 (two) times daily.    . methimazole (TAPAZOLE) 5 MG tablet TAKE 1 TABLET(5 MG) BY MOUTH DAILY 90 tablet 1  . MULTIPLE VITAMIN PO Take 1 tablet by mouth daily.    Marland Kitchen NORTREL 1/35, 28, tablet TAKE 1 TABLET BY MOUTH DAILY 84 tablet 4  . valACYclovir (VALTREX) 500 MG tablet TAKE 1 TABLET BY MOUTH ONCE DAILY 30 tablet 1  . cyanocobalamin (,VITAMIN B-12,) 1000 MCG/ML injection INJECT 1 ML INTO THE MUSCLE ONCE A WEEK 6 mL 0  . levocetirizine (XYZAL) 5 MG tablet TAKE 1 TABLET(5 MG) BY MOUTH EVERY EVENING (Patient not taking: Reported on 10/17/2018) 90 tablet 1  .  mesalamine (CANASA) 1000 MG suppository Place 1,000 mg rectally as needed (Ulcerative Proctitis).     . naproxen (NAPROSYN) 500 MG tablet Take 1 tablet (500 mg total) by mouth 2 (two) times daily as needed. 30 tablet 0  . ondansetron (ZOFRAN) 4 MG tablet Take 1 tablet (4 mg total) by mouth every 8 (eight) hours as needed for nausea or vomiting. (Patient not taking: Reported on 02/16/2019) 12 tablet 0  . oxyCODONE-acetaminophen (PERCOCET/ROXICET) 5-325 MG tablet Take 1-2 tablets by mouth every 4 (four) hours as needed for severe pain. 30 tablet 0  . pantoprazole (PROTONIX) 40 MG tablet Take 1 tablet (40 mg total) by mouth daily. (Patient not taking: Reported on 02/16/2019) 30 tablet 2   No current facility-administered medications for this visit.    Review of Systems Review of Systems Constitutional: negative for fatigue and weight loss Respiratory: negative for cough and wheezing Cardiovascular: negative for chest pain, fatigue and palpitations Gastrointestinal: negative for abdominal pain and change in bowel habits Genitourinary:positive for pelvic pain-LLQ Integument/breast: negative for nipple discharge Musculoskeletal:negative for myalgias Neurological: negative for gait problems and tremors Behavioral/Psych: negative for abusive relationship, depression Endocrine: negative for temperature intolerance      Blood pressure 120/80, pulse 78, height 5\' 4"  (1.626 m), weight 180 lb (81.6 kg), last menstrual period 02/07/2019, not currently breastfeeding.  Physical Exam Physical Exam General:   alert  Skin:   no rash or abnormalities  Lungs:   clear to auscultation bilaterally  Heart:   regular rate and rhythm, S1, S2 normal, no murmur, click, rub or gallop  Breasts:   normal without suspicious masses, skin or nipple changes or axillary nodes  Abdomen:  normal findings: no organomegaly, soft, non-tender and no hernia  Pelvis:  External genitalia: normal general appearance Urinary  system: urethral meatus normal and bladder without fullness, nontender Vaginal: normal without tenderness, induration or masses Cervix: normal appearance Adnexa: tenderness in right adnexa, no masses Uterus: anteverted and non-tender, normal size    50% of 25 min visit spent on counseling and coordination of care.   Data Reviewed Labs, CT Scan  Assessment     1. Pelvic pain Rx: - US PELVIC COMPLETE WITH TRANSVAGINAL; Future - oxyCODONE-acetaminophen (PERCOCET/ROXICET) 5-325 MG tablet; Take 1-2 tablets by mouth every 4 (four) hours as needed for severe pain.  Dispense: 30 tablet; Refill: 0  2. Vaginal discharge Rx: - Cervicovaginal ancillary only( [facility])    Plan    Orders Placed This Encounter  Procedures  . US PELVIC COMPLETE WITH TRANSVAGINAL    Standing Status:   Future    Standing Expiration Date:   04/18/2020  Order Specific Question:   Reason for Exam (SYMPTOM  OR DIAGNOSIS REQUIRED)    Answer:   Pelvic pain.    Order Specific Question:   Pref[facility] imaging [facility]?    [facility]   Meds ordered this encounter  Medications  . oxyCODONE-acetaminophen (PERCOCET/ROXICET) 5-325 MG tablet    Sig: Take 1-2 tablets by mouth every 4 (four) hours as needed for severe pain.    Dispense:  30 tablet    Refill:  0    Brock BadHarper, Yosgar Demirjian A, MD 02/16/2019 4:03 PM

## 2019-02-16 NOTE — Progress Notes (Signed)
Pt states she is having pain after her cycle each month x 3 months.  Pt states that pain will radiate from pelvis up to naval region.  Pt states pain will last a few days.  Pt was seen by Gertie Fey and recommended to f/u with GYN.

## 2019-02-17 LAB — CERVICOVAGINAL ANCILLARY ONLY
Bacterial Vaginitis (gardnerella): NEGATIVE
Candida Glabrata: NEGATIVE
Candida Vaginitis: NEGATIVE
Chlamydia: NEGATIVE
Comment: NEGATIVE
Comment: NEGATIVE
Comment: NEGATIVE
Comment: NEGATIVE
Comment: NEGATIVE
Comment: NORMAL
Neisseria Gonorrhea: NEGATIVE
Trichomonas: NEGATIVE

## 2019-02-20 ENCOUNTER — Telehealth: Payer: Self-pay | Admitting: Internal Medicine

## 2019-02-20 NOTE — Telephone Encounter (Signed)
Pls calling about a referral to the dermatologist; pls contact pt [phone]

## 2019-02-20 NOTE — Telephone Encounter (Signed)
F/u with [facility]. Appt has not been made.  Per [facility] please refax referral request for an Appointment. Per Lela notes are being re-faxed.  Left a Message on their Referral Voice Mail for their office to contact our office back in regards to this patient's Referral.  Called the patient and notified her that our office is f/u with [place] [facility] in response to her appointment.  Pt verbalized understanding.

## 2019-02-27 ENCOUNTER — Ambulatory Visit (HOSPITAL_COMMUNITY)
Admission: RE | Admit: 2019-02-27 | Discharge: 2019-02-27 | Disposition: A | Discharge status: Home / Self Care | Payer: Medicaid Other | Source: Ambulatory Visit | Attending: Obstetrics | Admitting: Obstetrics

## 2019-02-27 ENCOUNTER — Other Ambulatory Visit: Payer: Self-pay

## 2019-02-27 ENCOUNTER — Encounter (HOSPITAL_COMMUNITY): Payer: Self-pay

## 2019-02-27 ENCOUNTER — Encounter: Payer: Self-pay | Admitting: *Deleted

## 2019-02-27 DIAGNOSIS — R102 Pelvic and perineal pain: Secondary | ICD-10-CM | POA: Diagnosis not present

## 2019-03-07 DIAGNOSIS — K388 Other specified diseases of appendix: Secondary | ICD-10-CM | POA: Diagnosis not present

## 2019-03-13 DIAGNOSIS — L308 Other specified dermatitis: Secondary | ICD-10-CM | POA: Diagnosis not present

## 2019-04-03 ENCOUNTER — Telehealth: Payer: Self-pay

## 2019-04-03 NOTE — Telephone Encounter (Signed)
I called [facility] _dermatology,patient kept appointment on January 4,2021. I asked that records of that visit be sent to [place], [place] C1/25/20211:34 PM

## 2019-04-04 ENCOUNTER — Ambulatory Visit: Payer: Self-pay | Admitting: Surgery

## 2019-04-04 DIAGNOSIS — H20021 Recurrent acute iridocyclitis, right eye: Secondary | ICD-10-CM | POA: Diagnosis not present

## 2019-04-04 DIAGNOSIS — H20022 Recurrent acute iridocyclitis, left eye: Secondary | ICD-10-CM | POA: Diagnosis not present

## 2019-04-04 NOTE — H&P (Signed)
CC: Referred by Dr. Cristina Gong for newly diagnosed appendiceal mass 1.3 x 1.0 cm at tip  HPI: Sue [name] is a very pleasant 44yoF with hx of Rheumatoid arthritis, occasional asthma, HTN, hyperthyroidism, GERD, ?UC vs "proctitis" whom follows with Dr. Cristina Gong. She reports being diagnosed with colitis back in 2009. This is presumed to be ulcerative and the colitis changes had been limited to her rectosigmoid colon. She also has rheumatoid arthritis and her rheumatologist has her on Humira. Dr. Cristina Gong also has her on mesalamine. She reports that her mother carries a diagnosis of Crohn's disease. Back in August of this year, she developed left-sided abdominal cramps. She is evaluated in the emergency room and underwent CT scan. This demonstrated prominence of the distal appendix measuring up to 11 mm in diameter. Tip appendicitis was not considered likely given the provided history and lack of any periappendiceal inflammatory changes. General repeat CT scan 3 months later for surveillance and this demonstrated persistent nodule in the vicinity of the appendiceal tip about 1.3 x 1.0 cm. 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 She also has symmetric bilateral sacroiliitis. She followed up with Dr. Cristina Gong.  Her last colonoscopy was 03/25/2018 which demonstrated a normal appearing colon without any endoscopic evidence of inflammation. There was perhaps some loss of vascularity and minimal pinpoint friability in the distal rectum without granularity or exudate. These changes were "so subtle that they would probably be considered normal and the average patient." She had acid done. The terminal ileum was normal in appearance. Pathology returned colonic mucosa with no significant pathologic changes in the proximal colon. 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 She reports  having a relatively well-controlled colitis history. It is not completely clear whether not she has worn a Crohn's or ulcerative colitis and type or any IBD for that matter.  With regards to her symptoms, she does report that approximately 2-3 days after her menstrual cycle terminates that she'll have some crampy left-sided abdominal pain. She denies any history of right-sided abdominal pain right lower quadrant pain. She denies any history of fevers/chills associated with any abdominal pain. She denies ever having been told or concern that she could have appendicitis. She denies any known family history of colorectal cancer.  PMH: Rheumatoid arthritis, occasional asthma, HTN, hyperthyroidism, GERD, ?UC vs "proctitis" whom follows with Dr. Cristina Gong  PSH: She denies any prior abdominal/pelvic surgical history  FHx: Denies FHx of malignancy. She reports that her mother does have Crohn's disease that was initially diagnosed as ulcerative colitis but "changed to Crohn's as she aged."  Social: Denies use of tobacco/drugs; occasional EtOH use; she works in Scientist, research (medical) at [employer].  ROS: A comprehensive 10 system review of systems was completed with the patient and pertinent findings as noted above.  The patient is a 44 year old female.   Past Surgical History Emeline Gins, [place]; 03/07/2019 3:52 PM) Thyroid Surgery   Diagnostic Studies History Emeline Gins, [place]; 03/07/2019 3:52 PM) Colonoscopy  within last year Mammogram  within last year Pap Smear  1-5 years ago  Allergies Emeline Gins, CMA; 03/07/2019 3:56 PM) Amoxicillin *PENICILLINS*  Clindamycin HCl *ANTI-INFECTIVE AGENTS - MISC.*  Cipro *FLUOROQUINOLONES*  Sulfa Antibiotics  Allergies Reconciled   Medication History Emeline Gins, CMA; 03/07/2019 3:58 PM) Tylenol (500MG Capsule, Oral) Active. Famotidine (10MG Tablet, Oral) Active. Albuterol Sulfate (108 (90 Base)MCG/ACT Aero Pow Br Act, Inhalation)  Active. Canasa (1000MG Suppository, Rectal) Active. Humira (40MG/0.8ML Prefill Syr Kit, Subcutaneous) Active. methIMAzole (5MG Tablet, Oral) Active. Multi Vitamin (Oral) Active. Nortrel 0.5/35 (28) (0.5-35MG-MCG Tablet, Oral) Active. Norvasc (2.5MG Tablet, Oral) Active. Coricidin D (2-12.5-325MG Tablet, Oral) Active. Vitamin C (1000MG Tablet, Oral) Active. Medications Reconciled  Social History Emeline Gins, [place]; 03/07/2019 3:52 PM) Alcohol use  Occasional alcohol use. Illicit drug use  Remotely quit drug use. Tobacco use  Never smoker.  Family History Emeline Gins, [place]; 03/07/2019 3:52 PM) Alcohol Abuse  Father. Arthritis  Mother, Sister. Bleeding disorder  Sister. Cancer  Family Members In General. Cerebrovascular Accident  Family Members In General. Depression  Mother, Sister, Son. Diabetes Mellitus  Father, Sister. Heart disease in female family member before age 36  Hypertension  Father, Mother. Ischemic Bowel Disease  Mother. Migraine Headache  Mother, Sister. Respiratory Condition  Sister. Seizure disorder  Sister. Thyroid problems  Sister.  Pregnancy / Birth History Emeline Gins, [place]; 03/07/2019 3:52 PM) Age at menarche  34 years. Contraceptive History  Depo-provera, Oral contraceptives. Gravida  8 Irregular periods  Maternal age  27-20 Para  4  Other Problems Emeline Gins, CMA; 03/07/2019 3:52 PM) Arthritis  Asthma  Back Pain  Gastroesophageal Reflux Disease  High blood pressure  Kidney Stone  Other disease, cancer, significant illness  Thyroid Disease     Review of Systems Emeline Gins CMA; 03/07/2019 3:52 PM) General Not Present- Appetite Loss, Chills, Fatigue, Fever, Night Sweats, Weight Gain and Weight Loss. Skin Present- Rash. Not Present- Change in Wart/Mole, Dryness, Hives, Jaundice, New Lesions, Non-Healing Wounds and Ulcer. HEENT Present- Seasonal Allergies. Not Present- Earache,  Hearing Loss, Hoarseness, Nose Bleed, Oral Ulcers, Ringing in the Ears, Sinus Pain, Sore Throat, Visual Disturbances, Wears glasses/contact lenses and Yellow Eyes. Respiratory Not Present- Bloody sputum, Chronic Cough, Difficulty Breathing, Snoring and Wheezing. Breast Not Present- Breast Mass, Breast Pain, Nipple Discharge and Skin Changes. Cardiovascular Not Present- Chest Pain, Difficulty Breathing Lying Down, Leg Cramps, Palpitations, Rapid Heart Rate, Shortness of Breath and Swelling of Extremities. Gastrointestinal Present- Abdominal Pain. Not Present- Bloating, Bloody Stool, Change in Bowel Habits, Chronic diarrhea, Constipation, Difficulty Swallowing, Excessive gas, Gets full quickly at meals, Hemorrhoids, Indigestion, Nausea, Rectal Pain and Vomiting. Female Genitourinary Present- Pelvic Pain. Not Present- Frequency, Nocturia, Painful Urination and Urgency. Musculoskeletal Present- Back Pain, Joint Pain, Joint Stiffness and Swelling of Extremities. Not Present- Muscle Pain and Muscle Weakness. Neurological Present- Headaches. Not Present- Decreased Memory, Fainting, Numbness, Seizures, Tingling, Tremor, Trouble walking and Weakness. Psychiatric Not Present- Anxiety, Bipolar, Change in Sleep Pattern, Depression, Fearful and Frequent crying. Endocrine Present- Hair Changes. Not Present- Cold Intolerance, Excessive Hunger, Heat Intolerance, Hot flashes and New Diabetes. Hematology Not Present- Blood Thinners, Easy Bruising, Excessive bleeding, Gland problems, HIV and Persistent Infections.  Vitals Emeline Gins CMA; 03/07/2019 3:55 PM) 03/07/2019 3:54 PM Weight: 182.6 lb Height: 64in Body Surface Area: 1.88 m Body Mass Index: 31.34 kg/m  Temp.: 97.32F  Pulse: 104 (Regular)  BP: 124/82 (Sitting, Left Arm, Standard)       Physical Exam Harrell Gave M. Sulma Ruffino MD; 03/07/2019 4:48 PM) The physical exam findings are as follows: Note:Constitutional: No acute distress;  conversant; no deformities; wearing medical mask Eyes: Moist conjunctiva; no lid lag; anicteric sclerae; pupils equal and round Neck: Trachea midline; no palpable thyromegaly Lungs: Normal respiratory effort; no tactile fremitus CV: Regular rate and rhythm; no palpable thrill; no pitting edema GI: Abdomen soft, nontender, nondistended; no palpable hepatosplenomegaly MSK: Normal gait; no clubbing/cyanosis Psychiatric: Appropriate affect;  alert and oriented 3 Lymphatic: No palpable cervical or axillary lymphadenopathy    Assessment & Plan Harrell Gave M. Kaliopi Blyden MD; 03/07/2019 4:55 PM) MASS OF APPENDIX (K38.8) Story: Sue [name] is a very pleasant 89yoF with hx of RA (maintained on Humira), HTN, GERD, ?UC vs Crohn's vs other colitis (controlled on Humira which she is on for RA, and additionally mesalamine) - here with 1.3 x 1.0 cm mass of tip of appendix  -The anatomy and physiology of the GI tract was discussed at length with the patient. The pathophysiology of appendiceal masses was discussed at length with associated pictures. -Given her colitis history, we had a long discussion about surgical options moving forward. It is unclear which variant of colitis she has had. It is interesting that her mother had initially been diagnosed with ulcer colitis only to be told later in life she had Crohn's disease. I would be skeptical with her family history that she is somewhat who falls directly on the UC spectrum. All that said, we did discuss that she could've had a bout of appendicitis in the past which is overseeing now versus this being something more significant appendiceal mass. It does appear to have mildly increased in size and certainly persisted on 3 month surveillance CAT scan. We therefore discussed options any forward. Impression: -We discussed proceeding with appendectomy laparoscopic and potential open techniques were reviewed. Ideally, if possible, coming off Humira 6 weeks prior to surgery  and staying off at least 2 weeks after. We are discussing this further with her rheumatologist. -Until we have a better picture of what her colitis represents and/or history is, would hold off on any sort of prophylactic operation at this time including restorative proctocolectomy/j pouch, etc, particularly when this could be a NET vs other non malignant cause -We also discussed cases where we didn't find significant attachments of the appendix to surrounding tissues that may necessitate bowel resection or ileocecectomy or even right hemicolectomy. -The planned procedure, material risks (including, but not limited to, pain, bleeding, infection, scarring, need for blood transfusion, damage to surrounding structures- blood vessels/nerves/viscus/organs, damage to ureter, urine leak, leak from anastomosis, need for additional procedures, need for stoma which could be permanent, hernia, recurrence, potential need for future surgery if pathology shows certain findings, pneumonia, heart attack, stroke, death) benefits and alternatives to surgery were discussed at length. We discussed potential for ongoing persistent pains following her menstrual cycle that will unlikely resolved following removal of her appendix. The patient's questions were answered to her satisfaction, she voiced understanding and elected to proceed with surgery. Additionally, we discussed typical postoperative expectations and the recovery process.  Signed by Ileana Roup, MD (03/07/2019 4:55 PM)

## 2019-04-10 ENCOUNTER — Other Ambulatory Visit: Payer: Self-pay

## 2019-04-10 ENCOUNTER — Encounter: Payer: Self-pay | Admitting: Internal Medicine

## 2019-04-10 ENCOUNTER — Ambulatory Visit: Payer: Medicaid Other | Admitting: Internal Medicine

## 2019-04-10 VITALS — BP 132/85 | HR 82 | Temp 98.6°F | Ht 64.0 in | Wt 186.1 lb

## 2019-04-10 DIAGNOSIS — I10 Essential (primary) hypertension: Secondary | ICD-10-CM | POA: Diagnosis not present

## 2019-04-10 DIAGNOSIS — R21 Rash and other nonspecific skin eruption: Secondary | ICD-10-CM | POA: Diagnosis not present

## 2019-04-10 DIAGNOSIS — E538 Deficiency of other specified B group vitamins: Secondary | ICD-10-CM

## 2019-04-10 DIAGNOSIS — Z0000 Encounter for general adult medical examination without abnormal findings: Secondary | ICD-10-CM | POA: Diagnosis not present

## 2019-04-10 DIAGNOSIS — J302 Other seasonal allergic rhinitis: Secondary | ICD-10-CM

## 2019-04-10 NOTE — Patient Instructions (Signed)
To Ms. Cartier,   It was a pleasure seeing you again! Today we discussed your post nasal drip, hypertension, B12 levels. For your post nasal drip, please try sinus irrigation (neti pot), which you can find at [facility] or your local drug store. Please follow the directions contained within the package before using your Flonase. Continue to follow this regiment daily. I look forward to working with you again!  Sincerely,  Dolan Amen, MD

## 2019-04-11 ENCOUNTER — Encounter: Payer: Self-pay | Admitting: Internal Medicine

## 2019-04-11 ENCOUNTER — Telehealth: Payer: Self-pay | Admitting: Internal Medicine

## 2019-04-11 DIAGNOSIS — E538 Deficiency of other specified B group vitamins: Secondary | ICD-10-CM | POA: Insufficient documentation

## 2019-04-11 LAB — VITAMIN B12: Vitamin B-12: 332 pg/mL (ref 232–1245)

## 2019-04-11 NOTE — Telephone Encounter (Signed)
Pt has questions about starting back on her b12 injections.  Please call patient back.

## 2019-04-11 NOTE — Progress Notes (Signed)
CC: Post Nasal Drip   HPI:  [name] is a 44 y.o. with a PMH noted below, who presents to [facility] for post nasal drip. To see the management of her acute and chronic conditions, please see the separate assessment and plan note under the encounters tab.   Past Medical History:  Diagnosis Date  . Asthma    mild by PFT's 03/16/1998  . Cervical lymphadenopathy    biopsy negativeMarvetta [name])  . Chronic hypertension   . Complete spontaneous abortion    x2  . GERD (gastroesophageal reflux disease)   . History of abnormal Pap smear    s/p cryo( Dr. Wiliam Ke)  . HSV (herpes simplex virus) anogenital infection   . Hypertension   . Kidney stone   . Normal vaginal delivery    x3  . Rheumatoid arthritis (HCC)   . Thyroid disease affecting pregnancy   . 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   . Vaginal Pap smear, abnormal   . Vitamin B12 deficiency    Review of Systems:   Review of Systems  Constitutional: Positive for malaise/fatigue. Negative for chills, fever and weight loss.  HENT: Positive for congestion. Negative for sinus pain and sore throat.   Respiratory: Positive for cough. Negative for shortness of breath, wheezing and stridor.   Cardiovascular: Negative for chest pain and palpitations.  Gastrointestinal: Negative for abdominal pain, blood in stool, constipation, diarrhea, nausea and vomiting.  Skin: Positive for rash.       Bilateral skin lesions on the palmer aspects bilaterally.  Neurological: Negative for weakness and headaches.     Physical Exam:  Vitals:   04/10/19 1546  BP: 132/85  Pulse: 82  Temp: 98.6 F (37 C)  TempSrc: Oral  SpO2: 99%  Weight: 186 lb 1.6 oz (84.4 kg)  Height: 5\' 4"  (1.626 m)   Physical Exam Constitutional:      General: She is not in acute distress.    Appearance: Normal appearance.  She is not ill-appearing.  HENT:     Head: Normocephalic and atraumatic.     Right Ear: External ear normal.     Left Ear: External ear normal.     Nose: Nose normal.     Mouth/Throat:     Mouth: Mucous membranes are moist.     Pharynx: No oropharyngeal exudate or posterior oropharyngeal erythema.     Comments: Cobblestoning present on inspection. Eyes:     Conjunctiva/sclera: Conjunctivae normal.  Cardiovascular:     Rate and Rhythm: Normal rate and regular rhythm.     Pulses: Normal pulses.     Heart sounds: Normal heart sounds. No murmur. No friction rub. No gallop.   Pulmonary:     Effort: Pulmonary effort is normal.     Breath sounds: Normal breath sounds. No wheezing, rhonchi or rales.  Abdominal:     General: Abdomen is flat. Bowel sounds are normal.     Palpations: Abdomen is soft.     Tenderness: There is no abdominal tenderness. There is no guarding.  Musculoskeletal:     Right lower leg: No edema.     Left lower leg: No edema.  Skin:    General: Skin is warm.     Findings: Rash present. No bruising.     Comments: Erythematous lesions on the palms bilaterally.   Neurological:  General: No focal deficit present.     Mental Status: She is alert and oriented to person, place, and time.  Psychiatric:        Mood and Affect: Mood normal.        Behavior: Behavior normal.     Assessment & Plan:   See Encounters Tab for problem based charting.  Patient discussed with Dr. Philipp Ovens

## 2019-04-11 NOTE — Assessment & Plan Note (Signed)
Patient comes to the office for seasonal allergies. Patient states that she has had nasal fullness with post nasal drip with cough, which has been unrelieved by zyrtec or Flonase. She denies fevers, sinusitis, or headaches. On physical examination cobblestoning is present upon examination. Plan:  - Patient instructed to use a neti pot to cleanse her sinuses before utilizing Flonase daily.

## 2019-04-12 NOTE — Telephone Encounter (Signed)
Poke w/ pt, she states she thought her b12 was real low again, explained it is low normal. She wonders if she will need to get injections. Advised she may need to take otc tablet form but we would wait for response from md. Dr winters please advise

## 2019-04-13 NOTE — Telephone Encounter (Signed)
Notified pt of Dr. Sande Brothers recommendation, she verbalized understanding.  She is requesting a RX for Vitamin B-12 injections be sent to [facility].    Dr. Sande Brothers, This is on her med list, but it won't let me select it for you. Thank you, Skip Estimable, RN,BSN

## 2019-04-13 NOTE — Telephone Encounter (Signed)
To all,  I hope you are doing well. Patient's B12 is low normal, but since her levels were elevated on 09/2018, I think it would be beneficial to begin monthly B12 Injections.

## 2019-04-13 NOTE — Progress Notes (Signed)
Internal Medicine Clinic Attending  Case discussed with Dr. Winters at the time of the visit.  We reviewed the resident's history and exam and pertinent patient test results.  I agree with the assessment, diagnosis, and plan of care documented in the resident's note.  

## 2019-04-14 DIAGNOSIS — L308 Other specified dermatitis: Secondary | ICD-10-CM | POA: Diagnosis not present

## 2019-04-16 ENCOUNTER — Other Ambulatory Visit: Payer: Self-pay | Admitting: Internal Medicine

## 2019-04-16 DIAGNOSIS — I10 Essential (primary) hypertension: Secondary | ICD-10-CM

## 2019-04-17 ENCOUNTER — Other Ambulatory Visit: Payer: Self-pay

## 2019-04-17 MED ORDER — METHIMAZOLE 5 MG PO TABS: ORAL_TABLET | ORAL | 0 refills | Status: DC

## 2019-04-26 NOTE — Patient Instructions (Signed)
DUE TO COVID-19 ONLY ONE VISITOR IS ALLOWED TO COME WITH YOU AND STAY IN THE WAITING ROOM ONLY DURING PRE OP AND PROCEDURE DAY OF SURGERY. THE 1 VISITOR MAY VISIT WITH YOU AFTER SURGERY IN YOUR PRIVATE ROOM DURING VISITING HOURS ONLY!  YOU NEED TO HAVE A COVID 19 TEST ON: 05/04/19 @ 2:45 PM, THIS TEST MUST BE DONE BEFORE SURGERY, COME  [place], [place] , [id number].  ([place]) ONCE YOUR COVID TEST IS COMPLETED, PLEASE BEGIN THE QUARANTINE INSTRUCTIONS AS OUTLINED IN YOUR HANDOUT.                Tacy Learn    Your procedure is scheduled on: 05/08/19   Report to [facility] Main  Entrance   Report to SHORT STAY at: 5:30 AM     Call this number if you have problems the morning of surgery [phone]    Remember:   [place], NO CHEWING GUM [place].     Take these medicines the morning of surgery with A SIP OF WATER: AMLODIPINE,LORATADINE,METHIMAZOLE,NORTREL,PANTOPRAZOLE,VALACYCLOVIR.INHALERS,FLONASE,AND EYE DROPS AS NEEDED.                                 You may not have any metal on your body including hair pins and              piercings  Do not wear jewelry, make-up, lotions, powders or perfumes, deodorant             Do not wear nail polish on your fingernails.  Do not shave  48 hours prior to surgery.                Do not bring valuables to the hospital. [place].  Contacts, dentures or bridgework may not be worn into surgery.  Leave suitcase in the car. After surgery it may be brought to your room.     Patients discharged the day of surgery will not be allowed to drive home. IF YOU ARE HAVING SURGERY AND GOING HOME THE SAME DAY, YOU MUST HAVE AN ADULT TO DRIVE YOU HOME AND BE WITH YOU FOR 24 HOURS. YOU MAY GO HOME BY TAXI OR UBER OR ORTHERWISE, BUT AN ADULT MUST ACCOMPANY YOU HOME AND STAY WITH YOU FOR 24 HOURS.  Name and phone  number of your driver:  Special Instructions: N/A              Please read over the following fact sheets you were given: _____________________________________________________________________             [place] AS PER SURGEON INSTRUCTIONS. NOTHING BY MOUTH EXCEPT CLEAR LIQUIDS UNTIL:4:30 AM . PLEASE FINISH ENSURE DRINK PER SURGEON ORDER  WHICH NEEDS TO BE COMPLETED AT: 4:30 .   CLEAR LIQUID DIET   Foods Allowed  Foods Excluded  Coffee and tea, regular and decaf                             liquids that you cannot  Plain Jell-O any favor except red or purple                                           see through such as: Fruit ices (not with fruit pulp)                                     milk, soups, orange juice  Iced Popsicles                                    All solid food Carbonated beverages, regular and diet                                    Cranberry, grape and apple juices Sports drinks like Gatorade Lightly seasoned clear broth or consume(fat free) Sugar, honey syrup  Sample Menu Breakfast                                Lunch                                     Supper Cranberry juice                    Beef broth                            Chicken broth Jell-O                                     Grape juice                           Apple juice Coffee or tea                        Jell-O                                      Popsicle                                                Coffee or tea                        Coffee or tea  _____________________________________________________________________  [facility] - Preparing for Surgery Before surgery, you can play an important role.  Because skin is not sterile, your skin  needs to be as free of germs as possible.  You can reduce the number of germs on your skin by washing with CHG (chlorahexidine gluconate) soap  before surgery.  CHG is an antiseptic cleaner which kills germs and bonds with the skin to continue killing germs even after washing. Please DO NOT use if you have an allergy to CHG or antibacterial soaps.  If your skin becomes reddened/irritated stop using the CHG and inform your nurse when you arrive at Short Stay. Do not shave (including legs and underarms) for at least 48 hours prior to the first CHG shower.  You may shave your face/neck. Please follow these instructions carefully:  1.  Shower with CHG Soap the night before surgery and the  morning of Surgery.  2.  If you choose to wash your hair, wash your hair first as usual with your  normal  shampoo.  3.  After you shampoo, rinse your hair and body thoroughly to remove the  shampoo.                           4.  Use CHG as you would any other liquid soap.  You can apply chg directly  to the skin and wash                       Gently with a scrungie or clean washcloth.  5.  Apply the CHG Soap to your body ONLY FROM THE NECK DOWN.   Do not use on face/ open                           Wound or open sores. Avoid contact with eyes, ears mouth and genitals (private parts).                       Wash face,  Genitals (private parts) with your normal soap.             6.  Wash thoroughly, paying special attention to the area where your surgery  will be performed.  7.  Thoroughly rinse your body with warm water from the neck down.  8.  DO NOT shower/wash with your normal soap after using and rinsing off  the CHG Soap.                9.  Pat yourself dry with a clean towel.            10.  Wear clean pajamas.            11.  Place clean sheets on your bed the night of your first shower and do not  sleep with pets. Day of Surgery : Do not apply any lotions/deodorants the morning of surgery.  Please wear clean clothes to the hospital/surgery center.  FAILURE TO FOLLOW THESE INSTRUCTIONS MAY RESULT IN THE CANCELLATION OF YOUR SURGERY PATIENT  SIGNATURE_________________________________  NURSE SIGNATURE__________________________________  ________________________________________________________________________   Rogelia Mire  An incentive spirometer is a tool that can help keep your lungs clear and active. This tool measures how well you are filling your lungs with each breath. Taking long deep breaths may help reverse or decrease the chance of developing breathing (pulmonary) problems (especially infection) following:  A long period of time when you are unable to move or be active. BEFORE THE PROCEDURE   If the spirometer includes an indicator to  show your best effort, your nurse or respiratory therapist will set it to a desired goal.  If possible, sit up straight or lean slightly forward. Try not to slouch.  Hold the incentive spirometer in an upright position. INSTRUCTIONS FOR USE  1. Sit on the edge of your bed if possible, or sit up as far as you can in bed or on a chair. 2. Hold the incentive spirometer in an upright position. 3. Breathe out normally. 4. Place the mouthpiece in your mouth and seal your lips tightly around it. 5. Breathe in slowly and as deeply as possible, raising the piston or the ball toward the top of the column. 6. Hold your breath for 3-5 seconds or for as long as possible. Allow the piston or ball to fall to the bottom of the column. 7. Remove the mouthpiece from your mouth and breathe out normally. 8. Rest for a few seconds and repeat Steps 1 through 7 at least 10 times every 1-2 hours when you are awake. Take your time and take a few normal breaths between deep breaths. 9. The spirometer may include an indicator to show your best effort. Use the indicator as a goal to work toward during each repetition. 10. After each set of 10 deep breaths, practice coughing to be sure your lungs are clear. If you have an incision (the cut made at the time of surgery), support your incision when coughing  by placing a pillow or rolled up towels firmly against it. Once you are able to get out of bed, walk around indoors and cough well. You may stop using the incentive spirometer when instructed by your caregiver.  RISKS AND COMPLICATIONS  Take your time so you do not get dizzy or light-headed.  If you are in pain, you may need to take or ask for pain medication before doing incentive spirometry. It is harder to take a deep breath if you are having pain. AFTER USE  Rest and breathe slowly and easily.  It can be helpful to keep track of a log of your progress. Your caregiver can provide you with a simple table to help with this. If you are using the spirometer at home, follow these instructions: [place] IF:   You are having difficultly using the spirometer.  You have trouble using the spirometer as often as instructed.  Your pain medication is not giving enough relief while using the spirometer.  You develop fever of 100.5 F (38.1 C) or higher. SEEK IMMEDIATE MEDICAL CARE IF:   You cough up bloody sputum that had not been present before.  You develop fever of 102 F (38.9 C) or greater.  You develop worsening pain at or near the incision site. MAKE SURE YOU:   Understand these instructions.  Will watch your condition.  Will get help right away if you are not doing well or get worse. Document Released: 07/06/2006 Document Revised: 05/18/2011 Document Reviewed: 09/06/2006 [facility] Patient Information 2014 [place], [place].   ________________________________________________________________________

## 2019-04-27 ENCOUNTER — Other Ambulatory Visit: Payer: Self-pay

## 2019-04-27 ENCOUNTER — Other Ambulatory Visit (HOSPITAL_COMMUNITY): Payer: Medicaid Other

## 2019-04-27 ENCOUNTER — Encounter (HOSPITAL_COMMUNITY): Payer: Self-pay

## 2019-04-27 ENCOUNTER — Encounter (HOSPITAL_COMMUNITY)
Admission: RE | Admit: 2019-04-27 | Discharge: 2019-04-27 | Disposition: A | Discharge status: Home / Self Care | Payer: Medicaid Other | Source: Ambulatory Visit | Attending: Surgery | Admitting: Surgery

## 2019-04-27 ENCOUNTER — Encounter (HOSPITAL_COMMUNITY): Payer: Medicaid Other

## 2019-04-27 DIAGNOSIS — Z01818 Encounter for other preprocedural examination: Secondary | ICD-10-CM | POA: Diagnosis not present

## 2019-04-27 DIAGNOSIS — R9431 Abnormal electrocardiogram [ECG] [EKG]: Secondary | ICD-10-CM | POA: Insufficient documentation

## 2019-04-27 HISTORY — DX: Hypothyroidism, unspecified: E03.9

## 2019-04-27 LAB — COMPREHENSIVE METABOLIC PANEL
ALT: 20 U/L (ref 0–44)
AST: 19 U/L (ref 15–41)
Albumin: 3.9 g/dL (ref 3.5–5.0)
Alkaline Phosphatase: 95 U/L (ref 38–126)
Anion gap: 9 (ref 5–15)
BUN: 11 mg/dL (ref 6–20)
CO2: 25 mmol/L (ref 22–32)
Calcium: 9.1 mg/dL (ref 8.9–10.3)
Chloride: 105 mmol/L (ref 98–111)
Creatinine, Ser: 0.9 mg/dL (ref 0.44–1.00)
GFR calc Af Amer: >60 mL/min (ref >60)
GFR calc non Af Amer: >60 mL/min (ref >60)
Glucose, Bld: 96 mg/dL (ref 70–99)
Potassium: 3.9 mmol/L (ref 3.5–5.1)
Sodium: 139 mmol/L (ref 135–145)
Total Bilirubin: 0.4 mg/dL (ref 0.3–1.2)
Total Protein: 8.2 g/dL — ABNORMAL HIGH (ref 6.5–8.1)

## 2019-04-27 LAB — CBC WITH DIFFERENTIAL/PLATELET
Abs Immature Granulocytes: 0.02 10*3/uL (ref 0.00–0.07)
Basophils Absolute: 0 10*3/uL (ref 0.0–0.1)
Basophils Relative: 1 %
Eosinophils Absolute: 0.2 10*3/uL (ref 0.0–0.5)
Eosinophils Relative: 2 %
HCT: 41.4 % (ref 36.0–46.0)
Hemoglobin: 13.8 g/dL (ref 12.0–15.0)
Immature Granulocytes: 0 %
Lymphocytes Relative: 50 %
Lymphs Abs: 3.6 10*3/uL (ref 0.7–4.0)
MCH: 29.7 pg (ref 26.0–34.0)
MCHC: 33.3 g/dL (ref 30.0–36.0)
MCV: 89 fL (ref 80.0–100.0)
Monocytes Absolute: 0.5 10*3/uL (ref 0.1–1.0)
Monocytes Relative: 6 %
Neutro Abs: 3 10*3/uL (ref 1.7–7.7)
Neutrophils Relative %: 41 %
Platelets: 407 10*3/uL — ABNORMAL HIGH (ref 150–400)
RBC: 4.65 MIL/uL (ref 3.87–5.11)
RDW: 14.4 % (ref 11.5–15.5)
WBC: 7.4 10*3/uL (ref 4.0–10.5)
nRBC: 0 % (ref 0.0–0.2)

## 2019-04-27 LAB — HEMOGLOBIN A1C
Hgb A1c MFr Bld: 5.4 % (ref 4.8–5.6)
Mean Plasma Glucose: 108.28 mg/dL

## 2019-04-27 LAB — PROTIME-INR
INR: 1 (ref 0.8–1.2)
Prothrombin Time: 12.7 seconds (ref 11.4–15.2)

## 2019-04-27 LAB — APTT: aPTT: 29 seconds (ref 24–36)

## 2019-05-04 ENCOUNTER — Other Ambulatory Visit (HOSPITAL_COMMUNITY)
Admission: RE | Admit: 2019-05-04 | Discharge: 2019-05-04 | Disposition: A | Discharge status: Home / Self Care | Payer: Medicaid Other | Source: Ambulatory Visit | Attending: Surgery | Admitting: Surgery

## 2019-05-04 DIAGNOSIS — Z20822 Contact with and (suspected) exposure to covid-19: Secondary | ICD-10-CM | POA: Diagnosis not present

## 2019-05-04 DIAGNOSIS — Z01812 Encounter for preprocedural laboratory examination: Secondary | ICD-10-CM | POA: Insufficient documentation

## 2019-05-04 LAB — SARS CORONAVIRUS 2 (TAT 6-24 HRS): SARS Coronavirus 2: NEGATIVE

## 2019-05-05 NOTE — Anesthesia Preprocedure Evaluation (Addendum)
Anesthesia Evaluation  Patient identified by MRN, date of birth, ID band Patient awake    Reviewed: Allergy & Precautions, NPO status , Patient's Chart, lab work & pertinent test results  Airway Mallampati: I       Dental no notable dental hx. (+) Teeth Intact   Pulmonary asthma ,    Pulmonary exam normal        Cardiovascular hypertension, Pt. on medications Normal cardiovascular exam Rhythm:Regular Rate:Normal     Neuro/Psych  Neuromuscular disease negative psych ROS   GI/Hepatic GERD  Medicated,  Endo/Other    Renal/GU   negative genitourinary   Musculoskeletal   Abdominal Normal abdominal exam  (+)   Peds  Hematology   Anesthesia Other Findings   Reproductive/Obstetrics                            Anesthesia Physical Anesthesia Plan  ASA: II  Anesthesia Plan: General   Post-op Pain Management:    Induction: Intravenous  PONV Risk Score and Plan: 4 or greater and Ondansetron, Dexamethasone and Midazolam  Airway Management Planned: Oral ETT  Additional Equipment: None  Intra-op Plan:   Post-operative Plan: Extubation in OR  Informed Consent: I have reviewed the patients History and Physical, chart, labs and discussed the procedure including the risks, benefits and alternatives for the proposed anesthesia with the patient or authorized representative who has indicated his/her understanding and acceptance.     Dental advisory given  Plan Discussed with: CRNA  Anesthesia Plan Comments:        Anesthesia Quick Evaluation

## 2019-05-08 ENCOUNTER — Ambulatory Visit (HOSPITAL_COMMUNITY): Payer: Medicaid Other | Admitting: Certified Registered Nurse Anesthetist

## 2019-05-08 ENCOUNTER — Encounter (HOSPITAL_COMMUNITY): Payer: Self-pay | Admitting: Surgery

## 2019-05-08 ENCOUNTER — Ambulatory Visit (HOSPITAL_COMMUNITY)
Admission: RE | Admit: 2019-05-08 | Discharge: 2019-05-08 | Disposition: A | Discharge status: Home / Self Care | Payer: Medicaid Other | Attending: Surgery | Admitting: Surgery

## 2019-05-08 ENCOUNTER — Encounter (HOSPITAL_COMMUNITY)
Admission: RE | Disposition: A | Discharge status: Home / Self Care | Payer: Self-pay | Source: Home / Self Care | Attending: Surgery

## 2019-05-08 ENCOUNTER — Other Ambulatory Visit: Payer: Self-pay

## 2019-05-08 DIAGNOSIS — E039 Hypothyroidism, unspecified: Secondary | ICD-10-CM | POA: Diagnosis not present

## 2019-05-08 DIAGNOSIS — N805 Endometriosis of intestine: Secondary | ICD-10-CM | POA: Diagnosis not present

## 2019-05-08 DIAGNOSIS — J45909 Unspecified asthma, uncomplicated: Secondary | ICD-10-CM | POA: Insufficient documentation

## 2019-05-08 DIAGNOSIS — M069 Rheumatoid arthritis, unspecified: Secondary | ICD-10-CM | POA: Insufficient documentation

## 2019-05-08 DIAGNOSIS — I10 Essential (primary) hypertension: Secondary | ICD-10-CM | POA: Insufficient documentation

## 2019-05-08 DIAGNOSIS — R109 Unspecified abdominal pain: Secondary | ICD-10-CM | POA: Diagnosis present

## 2019-05-08 DIAGNOSIS — Z880 Allergy status to penicillin: Secondary | ICD-10-CM | POA: Diagnosis not present

## 2019-05-08 DIAGNOSIS — K388 Other specified diseases of appendix: Secondary | ICD-10-CM | POA: Diagnosis not present

## 2019-05-08 DIAGNOSIS — K219 Gastro-esophageal reflux disease without esophagitis: Secondary | ICD-10-CM | POA: Diagnosis not present

## 2019-05-08 DIAGNOSIS — Z881 Allergy status to other antibiotic agents status: Secondary | ICD-10-CM | POA: Insufficient documentation

## 2019-05-08 HISTORY — PX: LAPAROSCOPIC APPENDECTOMY: SHX408

## 2019-05-08 LAB — PREGNANCY, URINE: Preg Test, Ur: NEGATIVE

## 2019-05-08 SURGERY — APPENDECTOMY, LAPAROSCOPIC
Anesthesia: General | Site: Abdomen

## 2019-05-08 MED ORDER — PROMETHAZINE HCL 25 MG/ML IJ SOLN
6.2500 mg | INTRAMUSCULAR | Status: DC | PRN
  Administered 2019-05-08: 6.25 mg via INTRAVENOUS

## 2019-05-08 MED ORDER — TRAMADOL HCL 50 MG PO TABS: 50.0000 mg | ORAL_TABLET | Freq: Four times a day (QID) | ORAL | 0 refills | Status: AC | PRN

## 2019-05-08 MED ORDER — LIDOCAINE 2% (20 MG/ML) 5 ML SYRINGE
INTRAMUSCULAR | Status: DC | PRN
  Administered 2019-05-08: 60 mg via INTRAVENOUS

## 2019-05-08 MED ORDER — FENTANYL CITRATE (PF) 100 MCG/2ML IJ SOLN
INTRAMUSCULAR | Status: DC | PRN
  Administered 2019-05-08 (×2): 50 ug via INTRAVENOUS
  Administered 2019-05-08 (×2): 25 ug via INTRAVENOUS
  Administered 2019-05-08: 50 ug via INTRAVENOUS

## 2019-05-08 MED ORDER — FENTANYL CITRATE (PF) 100 MCG/2ML IJ SOLN
INTRAMUSCULAR | Status: AC
  Filled 2019-05-08: qty 2

## 2019-05-08 MED ORDER — SCOPOLAMINE 1 MG/3DAYS TD PT72
MEDICATED_PATCH | TRANSDERMAL | Status: AC
  Filled 2019-05-08: qty 1

## 2019-05-08 MED ORDER — MEPERIDINE HCL 50 MG/ML IJ SOLN: 6.2500 mg | INTRAMUSCULAR | Status: DC | PRN

## 2019-05-08 MED ORDER — PROMETHAZINE HCL 25 MG/ML IJ SOLN
INTRAMUSCULAR | Status: AC
  Filled 2019-05-08: qty 1

## 2019-05-08 MED ORDER — LACTATED RINGERS IV SOLN: INTRAVENOUS | Status: DC

## 2019-05-08 MED ORDER — ACETAMINOPHEN 500 MG PO TABS
1000.0000 mg | ORAL_TABLET | ORAL | Status: AC
  Administered 2019-05-08: 1000 mg via ORAL
  Filled 2019-05-08: qty 2

## 2019-05-08 MED ORDER — ROCURONIUM BROMIDE 10 MG/ML (PF) SYRINGE
PREFILLED_SYRINGE | INTRAVENOUS | Status: AC
  Filled 2019-05-08: qty 10

## 2019-05-08 MED ORDER — HEPARIN SODIUM (PORCINE) 5000 UNIT/ML IJ SOLN
5000.0000 [IU] | Freq: Once | INTRAMUSCULAR | Status: AC
  Administered 2019-05-08: 5000 [IU] via SUBCUTANEOUS
  Filled 2019-05-08: qty 1

## 2019-05-08 MED ORDER — HYDROMORPHONE HCL 1 MG/ML IJ SOLN
INTRAMUSCULAR | Status: AC
  Filled 2019-05-08: qty 1

## 2019-05-08 MED ORDER — KETOROLAC TROMETHAMINE 30 MG/ML IJ SOLN
30.0000 mg | Freq: Once | INTRAMUSCULAR | Status: AC | PRN
  Administered 2019-05-08: 30 mg via INTRAVENOUS

## 2019-05-08 MED ORDER — ONDANSETRON HCL 4 MG/2ML IJ SOLN
INTRAMUSCULAR | Status: AC
  Filled 2019-05-08: qty 2

## 2019-05-08 MED ORDER — SCOPOLAMINE 1 MG/3DAYS TD PT72
MEDICATED_PATCH | TRANSDERMAL | Status: DC | PRN
  Administered 2019-05-08: 1 via TRANSDERMAL

## 2019-05-08 MED ORDER — BUPIVACAINE HCL (PF) 0.25 % IJ SOLN
INTRAMUSCULAR | Status: DC | PRN
  Administered 2019-05-08: 35 mL

## 2019-05-08 MED ORDER — SUGAMMADEX SODIUM 200 MG/2ML IV SOLN
INTRAVENOUS | Status: DC | PRN
  Administered 2019-05-08: 170 mg via INTRAVENOUS

## 2019-05-08 MED ORDER — LIDOCAINE 2% (20 MG/ML) 5 ML SYRINGE
INTRAMUSCULAR | Status: AC
  Filled 2019-05-08: qty 5

## 2019-05-08 MED ORDER — PROPOFOL 10 MG/ML IV BOLUS
INTRAVENOUS | Status: AC
  Filled 2019-05-08: qty 20

## 2019-05-08 MED ORDER — ROCURONIUM BROMIDE 10 MG/ML (PF) SYRINGE
PREFILLED_SYRINGE | INTRAVENOUS | Status: DC | PRN
  Administered 2019-05-08: 60 mg via INTRAVENOUS

## 2019-05-08 MED ORDER — LACTATED RINGERS IR SOLN
Status: DC | PRN
  Administered 2019-05-08: 1000 mL

## 2019-05-08 MED ORDER — CHLORHEXIDINE GLUCONATE CLOTH 2 % EX PADS: 6.0000 | MEDICATED_PAD | Freq: Once | CUTANEOUS | Status: DC

## 2019-05-08 MED ORDER — ONDANSETRON HCL 4 MG/2ML IJ SOLN
INTRAMUSCULAR | Status: DC | PRN
  Administered 2019-05-08: 4 mg via INTRAVENOUS

## 2019-05-08 MED ORDER — HYDROMORPHONE HCL 1 MG/ML IJ SOLN
0.2500 mg | INTRAMUSCULAR | Status: DC | PRN
  Administered 2019-05-08 (×3): 0.5 mg via INTRAVENOUS

## 2019-05-08 MED ORDER — DEXAMETHASONE SODIUM PHOSPHATE 10 MG/ML IJ SOLN
INTRAMUSCULAR | Status: AC
  Filled 2019-05-08: qty 1

## 2019-05-08 MED ORDER — PROPOFOL 10 MG/ML IV BOLUS
INTRAVENOUS | Status: DC | PRN
  Administered 2019-05-08: 200 mg via INTRAVENOUS

## 2019-05-08 MED ORDER — MIDAZOLAM HCL 5 MG/5ML IJ SOLN
INTRAMUSCULAR | Status: DC | PRN
  Administered 2019-05-08: 2 mg via INTRAVENOUS

## 2019-05-08 MED ORDER — 0.9 % SODIUM CHLORIDE (POUR BTL) OPTIME
TOPICAL | Status: DC | PRN
  Administered 2019-05-08: 1000 mL

## 2019-05-08 MED ORDER — SODIUM CHLORIDE 0.9 % IV SOLN
2.0000 g | INTRAVENOUS | Status: AC
  Administered 2019-05-08: 2 g via INTRAVENOUS
  Filled 2019-05-08: qty 2

## 2019-05-08 MED ORDER — MIDAZOLAM HCL 2 MG/2ML IJ SOLN
INTRAMUSCULAR | Status: AC
  Filled 2019-05-08: qty 2

## 2019-05-08 MED ORDER — KETOROLAC TROMETHAMINE 30 MG/ML IJ SOLN
INTRAMUSCULAR | Status: AC
  Filled 2019-05-08: qty 1

## 2019-05-08 MED ORDER — DEXAMETHASONE SODIUM PHOSPHATE 10 MG/ML IJ SOLN
INTRAMUSCULAR | Status: DC | PRN
  Administered 2019-05-08: 8 mg via INTRAVENOUS

## 2019-05-08 MED ORDER — BUPIVACAINE HCL 0.25 % IJ SOLN
INTRAMUSCULAR | Status: AC
  Filled 2019-05-08: qty 1

## 2019-05-08 SURGICAL SUPPLY — 49 items
ADH SKN CLS APL DERMABOND .7 (GAUZE/BANDAGES/DRESSINGS) ×1
APL PRP STRL LF DISP 70% ISPRP (MISCELLANEOUS) ×1
APPLIER CLIP 5 13 M/L LIGAMAX5 (MISCELLANEOUS)
APPLIER CLIP ROT 10 11.4 M/L (STAPLE)
APR CLP MED LRG 11.4X10 (STAPLE)
APR CLP MED LRG 5 ANG JAW (MISCELLANEOUS)
BAG SPEC RTRVL LRG 6X4 10 (ENDOMECHANICALS) ×1
CABLE HIGH FREQUENCY MONO STRZ (ELECTRODE) IMPLANT
CHLORAPREP W/TINT 26 (MISCELLANEOUS) ×2 IMPLANT
CLIP APPLIE 5 13 M/L LIGAMAX5 (MISCELLANEOUS) IMPLANT
CLIP APPLIE ROT 10 11.4 M/L (STAPLE) IMPLANT
COVER SURGICAL LIGHT HANDLE (MISCELLANEOUS) ×2 IMPLANT
COVER WAND RF STERILE (DRAPES) IMPLANT
CUTTER FLEX LINEAR 45M (STAPLE) ×2 IMPLANT
DECANTER SPIKE VIAL GLASS SM (MISCELLANEOUS) ×2 IMPLANT
DERMABOND ADVANCED (GAUZE/BANDAGES/DRESSINGS) ×1
DERMABOND ADVANCED .7 DNX12 (GAUZE/BANDAGES/DRESSINGS) ×1 IMPLANT
DRAIN CHANNEL 19F RND (DRAIN) IMPLANT
DRAPE LAPAROSCOPIC ABDOMINAL (DRAPES) ×1 IMPLANT
ELECT PENCIL ROCKER SW 15FT (MISCELLANEOUS) ×1 IMPLANT
ELECT REM PT RETURN 15FT ADLT (MISCELLANEOUS) ×2 IMPLANT
ENDOLOOP SUT PDS II  0 18 (SUTURE)
ENDOLOOP SUT PDS II 0 18 (SUTURE) IMPLANT
EVACUATOR SILICONE 100CC (DRAIN) IMPLANT
GLOVE BIO SURGEON STRL SZ7.5 (GLOVE) ×2 IMPLANT
GLOVE INDICATOR 8.0 STRL GRN (GLOVE) ×2 IMPLANT
GOWN STRL REUS W/TWL XL LVL3 (GOWN DISPOSABLE) ×4 IMPLANT
KIT BASIN OR (CUSTOM PROCEDURE TRAY) ×2 IMPLANT
KIT TURNOVER KIT A (KITS) ×1 IMPLANT
PENCIL SMOKE EVACUATOR (MISCELLANEOUS) IMPLANT
POUCH SPECIMEN RETRIEVAL 10MM (ENDOMECHANICALS) ×2 IMPLANT
RELOAD 45 VASCULAR/THIN (ENDOMECHANICALS) IMPLANT
RELOAD STAPLE 45 2.5 WHT GRN (ENDOMECHANICALS) IMPLANT
RELOAD STAPLE 45 3.5 BLU ETS (ENDOMECHANICALS) IMPLANT
RELOAD STAPLE TA45 3.5 REG BLU (ENDOMECHANICALS) ×2 IMPLANT
SCISSORS LAP 5X35 DISP (ENDOMECHANICALS) IMPLANT
SET IRRIG TUBING LAPAROSCOPIC (IRRIGATION / IRRIGATOR) ×2 IMPLANT
SET TUBE SMOKE EVAC HIGH FLOW (TUBING) ×2 IMPLANT
SHEARS HARMONIC ACE PLUS 36CM (ENDOMECHANICALS) ×2 IMPLANT
SLEEVE ADV FIXATION 5X100MM (TROCAR) ×2 IMPLANT
SUT ETHILON 3 0 PS 1 (SUTURE) IMPLANT
SUT MNCRL AB 4-0 PS2 18 (SUTURE) ×2 IMPLANT
TOWEL OR 17X26 10 PK STRL BLUE (TOWEL DISPOSABLE) ×2 IMPLANT
TOWEL OR NON WOVEN STRL DISP B (DISPOSABLE) ×2 IMPLANT
TRAY FOLEY MTR SLVR 14FR STAT (SET/KITS/TRAYS/PACK) IMPLANT
TRAY FOLEY MTR SLVR 16FR STAT (SET/KITS/TRAYS/PACK) IMPLANT
TRAY LAPAROSCOPIC (CUSTOM PROCEDURE TRAY) ×2 IMPLANT
TROCAR ADV FIXATION 5X100MM (TROCAR) ×2 IMPLANT
TROCAR XCEL BLUNT TIP 100MML (ENDOMECHANICALS) ×2 IMPLANT

## 2019-05-08 NOTE — H&P (Signed)
CC: Referred by Dr. Cristina Gong for newly diagnosed appendiceal mass 1.3 x 1.0 cm at tip  HPI: Sue [name] is a very pleasant 42yoF with hx of Rheumatoid arthritis, occasional asthma, HTN, hyperthyroidism, GERD, ?UC vs "proctitis" whom follows with Dr. Cristina Gong. She reports being diagnosed with colitis back in 2009. This is presumed to be ulcerative and the colitis changes had been limited to her rectosigmoid colon. She also has rheumatoid arthritis and her rheumatologist has her on Humira. Dr. Cristina Gong also has her on mesalamine. She reports that her mother carries a diagnosis of Crohn's disease. Back in August of this year, she developed left-sided abdominal cramps. She is evaluated in the emergency room and underwent CT scan. This demonstrated prominence of the distal appendix measuring up to 11 mm in diameter. Tip appendicitis was not considered likely given the provided history and lack of any periappendiceal inflammatory changes. General repeat CT scan 3 months later for surveillance and this demonstrated persistent nodule in the vicinity of the appendiceal tip about 1.3 x 1.0 cm. 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 She also has symmetric bilateral sacroiliitis. She followed up with Dr. Cristina Gong.  Her last colonoscopy was 03/25/2018 which demonstrated a normal appearing colon without any endoscopic evidence of inflammation. There was perhaps some loss of vascularity and minimal pinpoint friability in the distal rectum without granularity or exudate. These changes were "so subtle that they would probably be considered normal and the average patient." She had acid done. The terminal ileum was normal in appearance. Pathology returned colonic mucosa with no significant pathologic changes in the proximal colon. 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 She  reports having a relatively well-controlled colitis history. It is not completely clear whether not she has worn a Crohn's or ulcerative colitis and type or any IBD for that matter.  With regards to her symptoms, she does report that approximately 2-3 days after her menstrual cycle terminates that she'll have some crampy left-sided abdominal pain. She denies any history of right-sided abdominal pain right lower quadrant pain. She denies any history of fevers/chills associated with any abdominal pain. She denies ever having been told or concern that she could have appendicitis. She denies any known family history of colorectal cancer.  INTERVAL HX She denies any changes in her health or health hx, no abdominal pain. Not currently requiring any therapy for her hx of ulcerative colitis. Denies any new medications.  PMH: Rheumatoid arthritis, occasional asthma, HTN, hyperthyroidism, GERD, ?UC vs "proctitis" whom follows with Dr. Cristina Gong  PSH: She denies any prior abdominal/pelvic surgical history  FHx: Denies FHx of malignancy. She reports that her mother does have Crohn's disease that was initially diagnosed as ulcerative colitis but "changed to Crohn's as she aged."  Social: Denies use of tobacco/drugs; occasional EtOH use; she works in Scientist, research (medical) at [employer].  ROS: A comprehensive 10 system review of systems was completed with the patient and pertinent findings as noted above.  Past Medical History:  Diagnosis Date   Asthma    mild by PFT's 03/16/1998   Cervical lymphadenopathy    biopsy negative( [facility])   Chronic hypertension    Complete spontaneous abortion    x2   GERD (gastroesophageal reflux disease)    History of abnormal Pap smear    s/p cryo( Dr. 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    Vaginal Pap smear, abnormal    Vitamin B12 deficiency     Past Surgical History:  Procedure Laterality Date   biopsy of lymph node in R neck  2007.   LEEP      Family History  Problem Relation Age of Onset   Ulcerative colitis Mother    Hypertension Mother    Diabetes Father    Heart attack Father    Hypertension Father    Diabetes Sister    Thyroid disease Sister    Heart attack Maternal Grandmother    Stroke Maternal Grandmother    Breast cancer Neg Hx     Social:  reports that she has never smoked. She has never used smokeless tobacco. She reports current alcohol use. She reports that she does not use drugs.  Allergies:  Allergies  Allergen Reactions   Ciprofloxacin Anaphylaxis and Swelling   Clindamycin/Lincomycin Anaphylaxis and Swelling   Sulfamethoxazole-Trimethoprim Swelling    Swelling is of the lips   Amoxicillin Rash    Has patient had a PCN reaction causing immediate rash, facial/tongue/throat swelling, SOB or lightheadedness with hypotension: no Has patient had a PCN reaction causing severe rash involving mucus membranes or skin necrosis: pt had rash -- non severe  Has patient had a PCN reaction that required hospitalization: no Has patient had a PCN reaction occurring within the last 10 years: no If all of the above answers are "NO", then may proceed with Cephalosporin use.     Medications: I have reviewed the patient's current medications.  Results for orders placed or performed during the hospital encounter of 05/08/19 (from the past 48 hour(s))  Pregnancy, urine     Status: None   Collection Time: 05/08/19  5:30 AM  Result Value Ref Range   Preg Test, Ur NEGATIVE NEGATIVE    Comment:        THE SENSITIVITY OF THIS METHODOLOGY IS >20  mIU/mL. Performed at [facility], [address], [place], [place] [address]     No results found.  ROS - all of the below systems have been reviewed with the patient and positives are indicated with bold text General: chills, fever or night sweats Eyes: blurry vision or double vision ENT: epistaxis or sore throat Allergy/Immunology: itchy/watery eyes or nasal congestion Hematologic/Lymphatic: bleeding problems, blood clots or swollen lymph nodes Endocrine: temperature intolerance or unexpected weight changes Breast: new or changing breast lumps or nipple discharge Resp: cough, shortness of breath, or wheezing CV: chest pain or dyspnea on exertion GI: as per HPI GU: dysuria, trouble voiding, or hematuria MSK: joint pain or joint stiffness Neuro: TIA or stroke symptoms Derm: pruritus and skin lesion changes Psych: anxiety and depression  PE Blood pressure (!) 133/92, pulse 88, temperature 99.1 F (37.3 C), temperature source Oral, resp. rate 14, height 5\' 4"  (1.626 m), weight 81.6 kg, last menstrual period 05/06/2019, SpO2 100 %, not currently breastfeeding. Constitutional: NAD; conversant; no deformities Eyes: Moist conjunctiva; no lid lag; anicteric; PERRL Neck: Trachea midline; no thyromegaly Lungs: Normal respiratory effort; no tactile fremitus CV: RRR; no palpable thrills; no pitting edema GI: Abd soft,  NT/ND; no palpable hepatosplenomegaly MSK: Normal range of motion of extremities; no clubbing/cyanosis Psychiatric: Appropriate affect; alert and oriented x3 Lymphatic: No palpable cervical or axillary lymphadenopathy  Results for orders placed or performed during the hospital encounter of 05/08/19 (from the past 48 hour(s))  Pregnancy, urine     Status: None   Collection Time: 05/08/19  5:30 AM  Result Value Ref Range   Preg Test, Ur NEGATIVE NEGATIVE    Comment:        THE SENSITIVITY OF THIS METHODOLOGY IS >20 mIU/mL. Performed at [facility], [address], [place], [place] [address]     No results found.   A/P: Ms. Kope is a very pleasant 43yoF with hx of RA (maintained on Humira), HTN, GERD, ?UC vs Crohn's vs other colitis (controlled on Humira which she is on for RA, and additionally mesalamine) - here with 1.3 x 1.0 cm mass of tip of appendix  -The anatomy and physiology of the GI tract was discussed at length with the patient. The pathophysiology of appendiceal masses was discussed at length as well -We re-reviewed appendectomy, laparoscopic and potential open techniques. She has been off her humira as per her rheumatologist -We also discussed cases where we could find significant adhesions of the appendix to surrounding tissues that may necessitate bowel resection or ileocecectomy or even right hemicolectomy. -The planned procedure, material risks (including, but not limited to, pain, bleeding, infection, scarring, need for blood transfusion, damage to surrounding structures- blood vessels/nerves/viscus/organs, damage to ureter, urine leak, leak from anastomosis, need for additional procedures, need for stoma which could be permanent, hernia, recurrence, potential need for future surgery if pathology shows certain findings, pneumonia, heart attack, stroke, death) benefits and alternatives to surgery were discussed at length. We discussed potential for ongoing persistent pains following her menstrual cycle that will unlikely resolved following removal of her appendix. The patient's questions were answered to her satisfaction, she voiced understanding and elected to proceed with surgery. Additionally, we discussed typical postoperative expectations and the recovery process.  Stephanie Coup. Cliffton Asters, M.D. [facility] Use [email or web address] to contact on call provider

## 2019-05-08 NOTE — Anesthesia Procedure Notes (Signed)
Procedure Name: Intubation Date/Time: 05/08/2019 7:41 AM Performed by: Victoriano Lain, CRNA Pre-anesthesia Checklist: Patient identified, Emergency Drugs available, Suction available, Patient being monitored and Timeout performed Patient Re-evaluated:Patient Re-evaluated prior to induction Oxygen Delivery Method: Circle system utilized Preoxygenation: Pre-oxygenation with 100% oxygen Induction Type: IV induction Ventilation: Mask ventilation without difficulty Laryngoscope Size: Mac and 4 Grade View: Grade I Tube type: Oral Tube size: 7.5 mm Number of attempts: 2 Airway Equipment and Method: Stylet Placement Confirmation: ETT inserted through vocal cords under direct vision,  positive ETCO2 and breath sounds checked- equal and bilateral Secured at: 21 cm Tube secured with: Tape Dental Injury: Teeth and Oropharynx as per pre-operative assessment

## 2019-05-08 NOTE — Transfer of Care (Signed)
Immediate Anesthesia Transfer of Care Note  Patient: Sue [name]  Procedure(s) Performed: APPENDECTOMY LAPAROSCOPIC WITH DIAGNOSTIC LAPAROSCOPY (N/A Abdomen)  Patient Location: PACU  Anesthesia Type:General  Level of Consciousness: awake, alert , oriented and patient cooperative  Airway & Oxygen Therapy: Patient Spontanous Breathing and Patient connected to face mask oxygen  Post-op Assessment: Report given to RN, Post -op Vital signs reviewed and stable and Patient moving all extremities  Post vital signs: Reviewed and stable  Last Vitals:  Vitals Value Taken Time  BP 130/85 05/08/19 0905  Temp    Pulse 94 05/08/19 0906  Resp 9 05/08/19 0906  SpO2 98 % 05/08/19 0906  Vitals shown include unvalidated device data.  Last Pain:  Vitals:   05/08/19 0552  TempSrc:   PainSc: 0-No pain         Complications: No apparent anesthesia complications

## 2019-05-08 NOTE — Op Note (Signed)
Sue [name] [id number]   PRE-OPERATIVE DIAGNOSIS:  Appendiceal mass - tip  POST-OPERATIVE DIAGNOSIS:  Same  Procedure(s): APPENDECTOMY LAPAROSCOPIC WITH DIAGNOSTIC LAPAROSCOPY  PROCEDURE:  1. Laparoscopic appendectomy 2. Diagnostic laparoscopy  SURGEON:  Sue [name], M.D.  ANESTHESIA: General endotracheal  EBL:   3 mL  DRAINS: None  SPECIMEN:  Appendix  COUNTS:  Sponge, needle and instrument counts were reported correct x2 at conclusion of the operation  DISPOSITION:  PACU in satisfactory condition  COMPLICATIONS: None  FINDINGS: Appendiceal tip with mass consistent with findings on CT scan. Thickened, firm tip with appendiceal mesentery wrapped around it.  Given findings of appendiceal mass, diagnostic laparoscopy was also carried out.  The peritoneum appeared normal without nodules or masses.  Omentum without thickening or masses.  There were some adhesions between the omentum and the left upper quadrant abdominal wall along the surface of the diaphragm which were left alone.  Normal-appearing small bowel.  Normal-appearing terminal ileum.  Normal-appearing cecum and ascending colon.  Normal-appearing sigmoid.  Uterus/tubes and ovaries without evident mass.  INDICATIONS: Sue [name] is a very pleasant 44 year old female with history of rheumatoid arthritis, asthma, hypertension, hypothyroidism, GERD, possible ulcerative colitis who follows with Sue [name].  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  She follows with rheumatology for her rheumatoid arthritis and has been maintained on Humira.  She developed some left-sided abdominal cramps back in August 2020.  She underwent evaluation including CT scan which demonstrated the appendix to have an enlarged/thickened tip measuring up to 11 mm in diameter.  Tip appendicitis was not considered likely given the history and lack of periappendiceal inflammatory  changes.  Repeat CT scan 3 months later for surveillance showed this nodule in the vicinity of the appendiceal tip at 1.3 x 1.0 cm.  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   Last colonoscopy 03/25/2018 demonstrated a normal-appearing colon without any endoscopic evidence of inflammation.  There was perhaps some loss of vascularity and minimal pinpoint friability in the distal rectum that were favored to be very subtle changes and on the spectrum of normal.  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  We had met and discussed everything at length in the office and discussed options moving forward.  She opted to pursue surgery.  Please refer to notes elsewhere for details regarding this discussion.  DESCRIPTION:   The patient was identified & brought into the operating room. SCDs were in place and functioning. General endotracheal anesthesia was administered. Preoperative antibiotics were administered. The patient was positioned supine with left arm tucked. Hair on the abdomen was then clipped by the OR team. A foley catheter was inserted under sterile conditions. The abdomen was prepped and draped in the standard sterile fashion. A surgical timeout confirmed our plan.  A small incision was made in the infraumbilical skin. The subcutaneous tissue was dissected and the umbilical stalk identified. The stalk was grasped with a Kocher and retracted outwardly. The infraumbilical fascia was exposed and incised. Peritoneal entry was carefully made bluntly. A 0 Vicryl purse-string suture was placed and then the [facility] port was introduced into the abdomen.  CO2 insufflation commenced to 6mHg. The laparoscope was inserted and confirmed no evidence of trocar site complications. The patient was then positioned in Trendelenburg. Two additional ports were placed - one in left lower quadrant and another in the suprapubic midline taking care to stay well  above the  bladder - 3 fingerbreadths above the pubic symphysis. The bed was then slightly tilted to place the left side down.  The terminal ileum was identified. Care was taken to avoid injuring any retroperitoneal structures. The appendix was identified and attachments to the appendix to the surrounding tissues were freed without difficulty.  The appendix was elevated.  The base of the appendix was circumferentially dissected taking care to preserve the cecum free of injury. The base was noted to be viable and healthy appearing. The terminal ileum, cecum and ascending colon also appeared normal. The base of the appendix was then stapled with a blue load, taking a small healthy cuff of viable cecum, taking care to stay clear of the ileocecal valve. The mesoappendix was then ligated using the harmonic scalpel. The mesoappendix was inspected and noted to be hemostatic. The appendix was placed in an EndoBag.  The right lower quadrant was conservatively irrigated. Hemostasis was noted to be achieved - taking time to inspect the ligated mesoappendix, colon mesentery, and retroperitoneum. Staple line was noted to be intact on the cecum with no bleeding. There was no perforation or injury.  Diagnostic laparoscopy was then carried out.  The liver surface was normal in appearance without nodular changes or masses.  The omentum was normal in appearance.  There were some omental adhesions in the left upper quadrant that appeared filmy.  The ascending, intraperitoneal portions of the transverse, distal descending and sigmoid colon were all normal in appearance.  The tubes/ovaries are normal in appearance.  The uterus was normal in appearance.  The parietal and visceral peritoneum were normal in appearance.  The Endobag was removed from the umbilical port site and passed off as specimen.  There was no perforation of the appendix during the procedure or extraction.  Under direct visualization, the left lower quadrant port  site was removed and noted to be hemostatic.  The Hassan cannula was then removed and the 0 Vicryl figure-of-eight suture tied.  The defect was then palpated and noted to be completely closed.  After exhausting CO2, the suprapubic port site was removed.  The skin of all incision sites was closed with 4-0 Monocryl subcuticular suture.  Dermabond was applied to the skin incisions.  The foley catheter was then removed, she was awakened from anesthesia, extubated, and transferred to a stretcher for transport to recover in satisfactory condition.

## 2019-05-08 NOTE — Discharge Instructions (Signed)
POST OP INSTRUCTIONS  1. DIET: As tolerated. Follow a light bland diet the first 24 hours after arrival home, such as soup, liquids, crackers, etc.  Be sure to include lots of fluids daily.  Avoid fast food or heavy meals as your are more likely to get nauseated.  Eat a low fat the next few days after surgery.  2. Take your usually prescribed home medications unless otherwise directed.  3. PAIN CONTROL: a. Pain is best controlled by a usual combination of three different methods TOGETHER: i. Ice/Heat ii. Over the counter pain medication iii. Prescription pain medication b. Most patients will experience some swelling and bruising around the surgical site.  Ice packs or heating pads (30-60 minutes up to 6 times a day) will help. Some people prefer to use ice alone, heat alone, alternating between ice & heat.  Experiment to what works for you.  Swelling and bruising can take several weeks to resolve.   c. It is helpful to take an over-the-counter pain medication regularly for the first few weeks: i. Ibuprofen (Motrin/Advil) - 200mg tabs - take 3 tabs (600mg) every 6 hours as needed for pain ii. Acetaminophen (Tylenol) - you may take 650mg every 6 hours as needed. You can take this with motrin as they act differently on the body. If you are taking a narcotic pain medication that has acetaminophen in it, do not take over the counter tylenol at the same time.  Iii. NOTE: You may take both of these medications together - most patients  find it most helpful when alternating between the two (i.e. Ibuprofen at 6am,  tylenol at 9am, ibuprofen at 12pm ...) d. A  prescription for pain medication should be given to you upon discharge.  Take your pain medication as prescribed if your pain is not adequatly controlled with the over-the-counter pain reliefs mentioned above.  4. Avoid getting constipated.  Between the surgery and the pain medications, it is common to experience some constipation.  Increasing fluid  intake and taking a fiber supplement (such as Metamucil, Citrucel, FiberCon, MiraLax, etc) 1-2 times a day regularly will usually help prevent this problem from occurring.  A mild laxative (prune juice, Milk of Magnesia, MiraLax, etc) should be taken according to package directions if there are no bowel movements after 48 hours.    5. Dressing: Your incisions are covered in Dermabond which is like sterile superglue for the skin. This will come off on it's own in a couple weeks. It is waterproof and you may bathe normally starting the day after your surgery in a shower. Avoid baths/pools/lakes/oceans until your wounds have fully healed.  6. ACTIVITIES as tolerated:   a. Avoid heavy lifting (>10lbs or 1 gallon of milk) for the next 6 weeks. b. You may resume regular (light) daily activities beginning the next day--such as daily self-care, walking, climbing stairs--gradually increasing activities as tolerated.  If you can walk 30 minutes without difficulty, it is safe to try more intense activity such as jogging, treadmill, bicycling, low-impact aerobics.  c. DO NOT PUSH THROUGH PAIN.  Let pain be your guide: If it hurts to do something, don't do it. d. You may drive when you are no longer taking prescription pain medication, you can comfortably wear a seatbelt, and you can safely maneuver your car and apply brakes.   7. FOLLOW UP in our office a. Please call [facility] at [phone] to set up an appointment to see your surgeon in the office for a follow-up   appointment approximately 2 weeks after your surgery. b. Make sure that you call for this appointment the day you arrive home to insure a convenient appointment time.  9. If you have disability or family leave forms that need to be completed, you may have them completed by your primary care physician's office; for return to work instructions, please ask our office staff and they will be happy to assist you in obtaining this documentation   When to call  us [phone]: 1. Poor pain control 2. Reactions / problems with new medications (rash/itching, etc)  3. Fever over 101.5 F (38.5 C) 4. Inability to urinate 5. Nausea/vomiting 6. Worsening swelling or bruising 7. Continued bleeding from incision. 8. Increased pain, redness, or drainage from the incision  The clinic staff is available to answer your questions during regular business hours (8:30am-5pm).  Please don't hesitate to call and ask to speak to one of our nurses for clinical concerns.   A surgeon from [facility] is always on call at the hospitals   If you have a medical emergency, go to the nearest emergency room or call 911.  [facility] [address], [address], [place], [place]  [address] MAIN: [phone] FAX: [phone] [email or web address] 

## 2019-05-08 NOTE — Anesthesia Postprocedure Evaluation (Addendum)
Anesthesia Post Note  Patient: Mining engineer  Procedure(s) Performed: APPENDECTOMY LAPAROSCOPIC WITH DIAGNOSTIC LAPAROSCOPY (N/A Abdomen)     Patient location during evaluation: PACU Anesthesia Type: General Level of consciousness: awake and sedated Pain management: pain level controlled Vital Signs Assessment: post-procedure vital signs reviewed and stable Respiratory status: spontaneous breathing Cardiovascular status: stable Anesthetic complications: yes Anesthetic complication details: PONV   Last Vitals:  Vitals:   05/08/19 1030 05/08/19 1045  BP: 114/75   Pulse: 92   Resp: 12   Temp: 36.4 C   SpO2: 95% 93%    Last Pain:  Vitals:   05/08/19 1030  TempSrc:   PainSc: 0-No pain   Pain Goal:                   Caren Macadam

## 2019-05-09 LAB — SURGICAL PATHOLOGY

## 2019-05-25 ENCOUNTER — Ambulatory Visit: Payer: Medicaid Other | Attending: Family

## 2019-05-25 DIAGNOSIS — Z23 Encounter for immunization: Secondary | ICD-10-CM

## 2019-05-25 NOTE — Progress Notes (Signed)
   Covid-19 Vaccination Clinic  Name:  Sue [name]    MRN: [id number] DOB: 1975-11-09  05/25/2019  Ms. Degrasse was observed post Covid-19 immunization for 15 minutes without incident. She was provided with Vaccine Information Sheet and instruction to access the V-Safe system.   Ms. Burda was instructed to call 911 with any severe reactions post vaccine: Marland Kitchen Difficulty breathing  . Swelling of face and throat  . A fast heartbeat  . A bad rash all over body  . Dizziness and weakness   Immunizations Administered    Name Date Dose VIS Date Route   Moderna COVID-19 Vaccine 05/25/2019 12:50 PM 0.5 mL 02/07/2019 Intramuscular   Manufacturer: Moderna   Lot: [id number]   NDC: 36122-449-75

## 2019-05-30 ENCOUNTER — Telehealth: Payer: Self-pay

## 2019-05-30 ENCOUNTER — Other Ambulatory Visit: Payer: Self-pay | Admitting: Obstetrics

## 2019-05-30 DIAGNOSIS — L0293 Carbuncle, unspecified: Secondary | ICD-10-CM

## 2019-05-30 MED ORDER — DOXYCYCLINE HYCLATE 100 MG PO CAPS: 100.0000 mg | ORAL_CAPSULE | Freq: Two times a day (BID) | ORAL | 2 refills | Status: DC

## 2019-05-30 NOTE — Telephone Encounter (Signed)
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

## 2019-06-02 ENCOUNTER — Other Ambulatory Visit: Payer: Self-pay | Admitting: Student

## 2019-06-02 DIAGNOSIS — R109 Unspecified abdominal pain: Secondary | ICD-10-CM

## 2019-06-05 ENCOUNTER — Ambulatory Visit (HOSPITAL_BASED_OUTPATIENT_CLINIC_OR_DEPARTMENT_OTHER)
Admission: RE | Admit: 2019-06-05 | Discharge: 2019-06-05 | Disposition: A | Discharge status: Home / Self Care | Payer: Medicaid Other | Source: Ambulatory Visit | Attending: Student | Admitting: Student

## 2019-06-05 ENCOUNTER — Other Ambulatory Visit: Payer: Self-pay

## 2019-06-05 DIAGNOSIS — R109 Unspecified abdominal pain: Secondary | ICD-10-CM | POA: Diagnosis not present

## 2019-06-05 MED ORDER — IOHEXOL 300 MG/ML  SOLN
100.0000 mL | Freq: Once | INTRAMUSCULAR | Status: AC | PRN
  Administered 2019-06-05: 100 mL via INTRAVENOUS

## 2019-06-23 ENCOUNTER — Ambulatory Visit: Payer: Medicaid Other | Admitting: Endocrinology

## 2019-06-27 ENCOUNTER — Ambulatory Visit: Payer: Medicaid Other | Admitting: Endocrinology

## 2019-06-27 ENCOUNTER — Encounter: Payer: Self-pay | Admitting: Endocrinology

## 2019-06-27 ENCOUNTER — Other Ambulatory Visit: Payer: Self-pay

## 2019-06-27 ENCOUNTER — Ambulatory Visit: Payer: Medicaid Other | Attending: Family

## 2019-06-27 VITALS — BP 104/70 | HR 83 | Ht 64.0 in | Wt 183.0 lb

## 2019-06-27 DIAGNOSIS — Z23 Encounter for immunization: Secondary | ICD-10-CM

## 2019-06-27 DIAGNOSIS — E0590 Thyrotoxicosis, unspecified without thyrotoxic crisis or storm: Secondary | ICD-10-CM | POA: Diagnosis not present

## 2019-06-27 LAB — TSH: TSH: 1.29 u[IU]/mL (ref 0.35–4.50)

## 2019-06-27 LAB — T4, FREE: Free T4: 1.02 ng/dL (ref 0.60–1.60)

## 2019-06-27 NOTE — Progress Notes (Signed)
Subjective:    Patient ID: Sue [name], female    DOB: May 16, 1975, 44 y.o.   MRN: [id number]  HPI Pt returns for f/u of hyperthyroidism (dx'ed 2010; she has never had dedicated thyroid imaging, but 2014 CT showed 6 x 5 mm nodule in the left lobe of the thyroid; hyperthyroidism was mild, but she was still rx'ed with tapazole, due to poor wt gain).  pt states she feels well in general.  Specifically, she denies palpitations and tremor.  [name] has had vasectomy.  She takes tapazole as rx'ed Past Medical History:  Diagnosis Date  . Asthma    mild by PFT's 03/16/1998  . Cervical lymphadenopathy    biopsy negativeMarvetta Gibbons)  . Chronic hypertension   . Complete spontaneous abortion    x2  . GERD (gastroesophageal reflux disease)   . History of abnormal Pap smear    s/p cryo( Dr. Wiliam Ke)  . HSV (herpes simplex virus) anogenital infection   . Hypertension   . Hypothyroidism   . Kidney stone   . Normal vaginal delivery    x3  . Rheumatoid arthritis (HCC)   . Thyroid disease affecting pregnancy   . 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   . Vaginal Pap smear, abnormal   . Vitamin B12 deficiency     Past Surgical History:  Procedure Laterality Date  . biopsy of lymph node in R neck  2007.  Marland Kitchen LAPAROSCOPIC APPENDECTOMY N/A 05/08/2019   Procedure: APPENDECTOMY LAPAROSCOPIC WITH DIAGNOSTIC LAPAROSCOPY;  Surgeon: [name], MD;  Location: [facility];  Service: General;  Laterality: N/A;  . LEEP      Social History   Socioeconomic History  . Marital status: Single    Spouse name: Not on file  . Number of children: Not on file  . Years of education: Not on file  . Highest education level: Not on file  Occupational History  . Not on file  Tobacco Use  . Smoking status: Never Smoker  . Smokeless tobacco: Never Used  Substance  and Sexual Activity  . Alcohol use: Yes    Alcohol/week: 0.0 standard drinks    Comment: Sometimes.  . Drug use: No  . Sexual activity: Yes    Birth control/protection: Pill  Other Topics Concern  . Not on file  Social History Narrative  . Not on file   Social Determinants of Health   Financial Resource Strain:   . Difficulty of Paying Living Expenses:   Food Insecurity:   . Worried About Programme researcher, broadcasting/film/video in the Last Year:   . Barista in the Last Year:   Transportation Needs:   . Freight forwarder (Medical):   Marland Kitchen Lack of Transportation (Non-Medical):   Physical Activity:   . Days of Exercise per Week:   . Minutes of Exercise per Session:   Stress:   . Feeling of Stress :   Social Connections:   . Frequency of Communication with Friends and Family:   . Frequency of Social Gatherings with Friends and Family:   . Attends Religious Services:   . Active Member of Clubs or Organizations:   . Attends Banker Meetings:   Marland Kitchen Marital Status:   Intimate Partner Violence:   . Fear of Current or Ex-Partner:   . Emotionally Abused:   Marland Kitchen Physically Abused:   .  Sexually Abused:     Current Outpatient Medications on File Prior to Visit  Medication Sig Dispense Refill  . acetaminophen (TYLENOL) 500 MG tablet Take 1,000 mg by mouth every 6 (six) hours as needed for mild pain, moderate pain or headache.    . albuterol (PROVENTIL HFA;VENTOLIN HFA) 108 (90 Base) MCG/ACT inhaler Inhale 1-2 puffs into the lungs every 6 (six) hours as needed for wheezing or shortness of breath. 1 Inhaler 1  . amLODipine (NORVASC) 10 MG tablet TAKE 1 TABLET(10 MG) BY MOUTH DAILY (Patient taking differently: Take 10 mg by mouth daily. ) 90 tablet 1  . Chlorpheniramine-DM (CORICIDIN HBP COUGH/COLD PO) Take 1 tablet by mouth daily as needed (for cough).     . cyanocobalamin (,VITAMIN B-12,) 1000 MCG/ML injection Inject 1,000 mcg into the muscle every 30 (thirty) days.    Marland Kitchen doxycycline  (VIBRAMYCIN) 100 MG capsule Take 1 capsule (100 mg total) by mouth 2 (two) times daily. 14 capsule 2  . fluticasone (FLONASE) 50 MCG/ACT nasal spray Place 2 sprays into both nostrils daily as needed for allergies or rhinitis.    Marland Kitchen loratadine (CLARITIN) 10 MG tablet Take 10 mg by mouth daily as needed for allergies.    . methimazole (TAPAZOLE) 5 MG tablet TAKE 1 TABLET(5 MG) BY MOUTH DAILY (Patient taking differently: Take 5 mg by mouth daily. ) 90 tablet 0  . MULTIPLE VITAMIN PO Take 1 tablet by mouth daily.    Marland Kitchen NORTREL 1/35, 28, tablet TAKE 1 TABLET BY MOUTH DAILY (Patient taking differently: Take 1 tablet by mouth daily. ) 84 tablet 4  . prednisoLONE acetate (PRED FORTE) 1 % ophthalmic suspension Place 1 drop into the right eye daily as needed for irritation.    . triamcinolone cream (KENALOG) 0.1 % Apply 1 application topically 2 (two) times daily.    . valACYclovir (VALTREX) 500 MG tablet TAKE 1 TABLET BY MOUTH ONCE DAILY (Patient taking differently: Take 500 mg by mouth daily as needed (outbreak). ) 30 tablet 1  . vitamin C (ASCORBIC ACID) 250 MG tablet Take 250 mg by mouth daily.     No current facility-administered medications on file prior to visit.    Allergies  Allergen Reactions  . Ciprofloxacin Anaphylaxis and Swelling  . Clindamycin/Lincomycin Anaphylaxis and Swelling  . Sulfamethoxazole-Trimethoprim Swelling    Swelling is of the lips  . Amoxicillin Rash    Has patient had a PCN reaction causing immediate rash, facial/tongue/throat swelling, SOB or lightheadedness with hypotension: no Has patient had a PCN reaction causing severe rash involving mucus membranes or skin necrosis: pt had rash -- non severe  Has patient had a PCN reaction that required hospitalization: no Has patient had a PCN reaction occurring within the last 10 years: no If all of the above answers are "NO", then may proceed with Cephalosporin use.     Family History  Problem Relation Age of Onset  .  Ulcerative colitis Mother   . Hypertension Mother   . Diabetes Father   . Heart attack Father   . Hypertension Father   . Diabetes Sister   . Thyroid disease Sister   . Heart attack Maternal Grandmother   . Stroke Maternal Grandmother   . Breast cancer Neg Hx     BP 104/70   Pulse 83   Ht 5\' 4"  (1.626 m)   Wt 183 lb (83 kg)   LMP 05/30/2019   SpO2 98%   BMI 31.41 kg/m    Review of  Systems Denies fever    Objective:   Physical Exam VITAL SIGNS:  See vs page GENERAL: no distress NECK: There is no palpable thyroid enlargement.  No thyroid nodule is palpable.  No palpable lymphadenopathy at the anterior neck.  Lab Results  Component Value Date   TSH 1.29 06/27/2019   T4TOTAL 12.3 (H) 10/06/2016       Assessment & Plan:  Hyperthyroidism: well-controlled.  Please continue the same medication Please come back for a follow-up appointment in 4 months

## 2019-06-27 NOTE — Progress Notes (Signed)
   Covid-19 Vaccination Clinic  Name:  Sue [name]    MRN: [id number] DOB: 06/09/1975  06/27/2019  Sue [name] was observed post Covid-19 immunization for 15 minutes without incident. She was provided with Vaccine Information Sheet and instruction to access the V-Safe system.   Sue [name] was instructed to call 911 with any severe reactions post vaccine: Marland Kitchen Difficulty breathing  . Swelling of face and throat  . A fast heartbeat  . A bad rash all over body  . Dizziness and weakness   Immunizations Administered    Name Date Dose VIS Date Route   Moderna COVID-19 Vaccine 06/27/2019 12:41 PM 0.5 mL 02/2019 Intramuscular   Manufacturer: Moderna   Lot: [id number]   NDC: 59935-701-77

## 2019-06-27 NOTE — Patient Instructions (Signed)
Blood tests are requested for you today.  We'll let you know about the results.   If ever you have fever while taking methimazole, stop it and call us, even if the reason is obvious, because of the risk of a rare side-effect.  It is best to never miss the medication.  However, if you do miss it, next best is to double up the next time.   Please come back for a follow-up appointment in 4 months.   

## 2019-07-02 IMAGING — US US FETAL BPP W/ NON-STRESS
1 series · 13 of 13 positions shown · non-contrast
Comparison: none

[Series 1: us fetal bpp w/nonstress · 13 acquisitions, 13 frames shown]
[im 1/13]
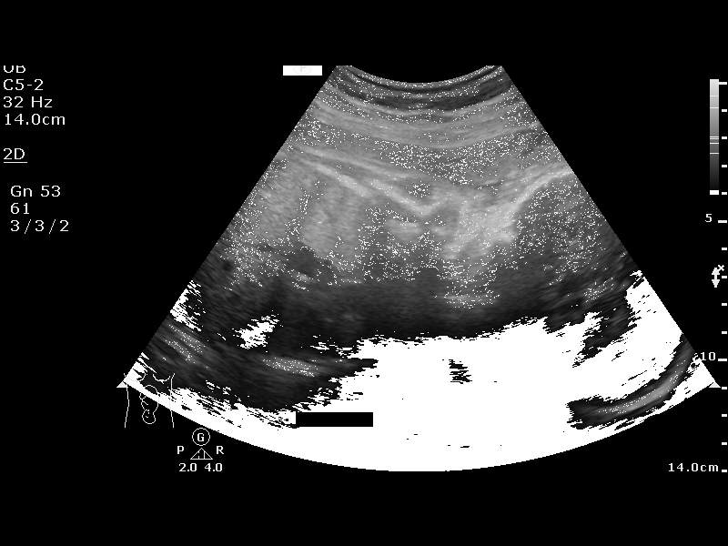
[im 2/13]
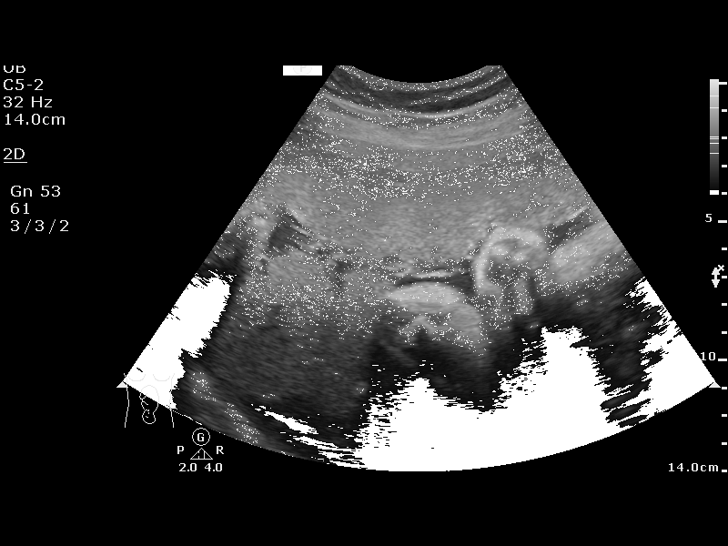
[im 3/13]
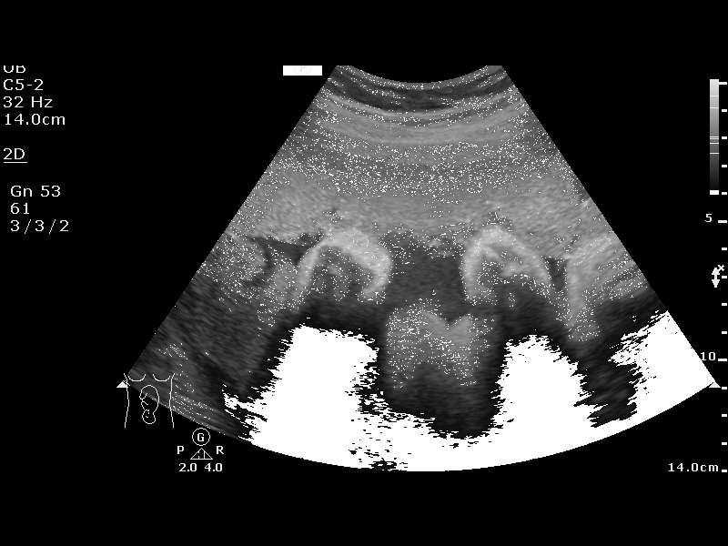
[im 4/13]
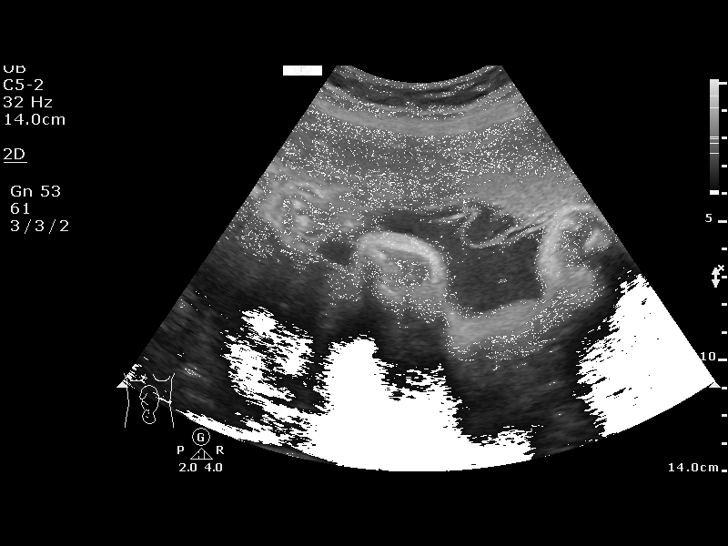
[im 5/13]
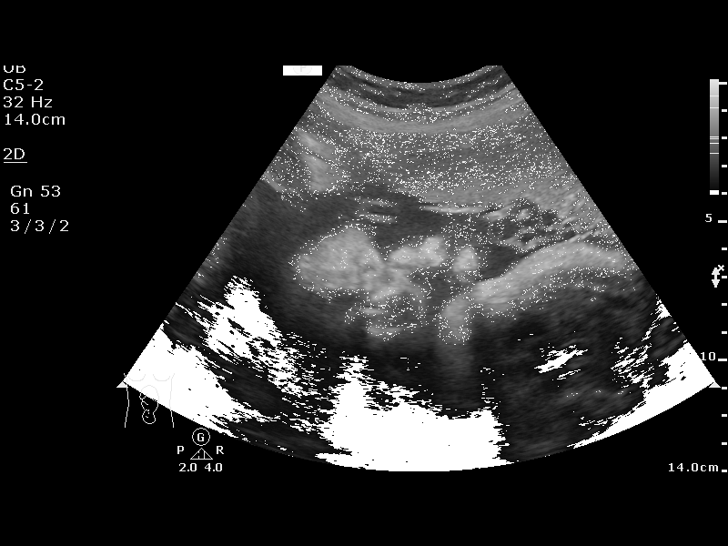
[im 6/13]
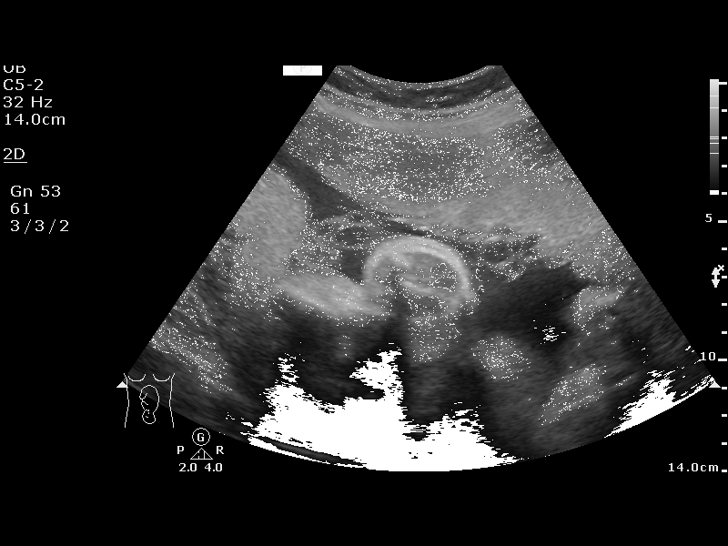
[im 7/13]
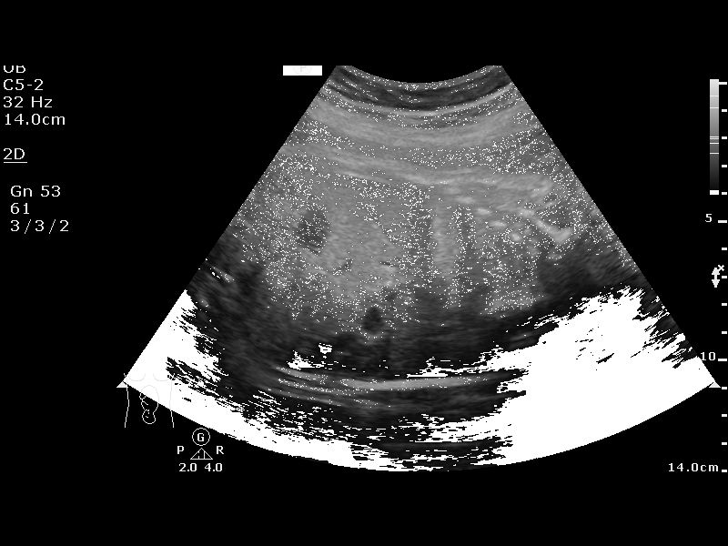
[im 8/13]
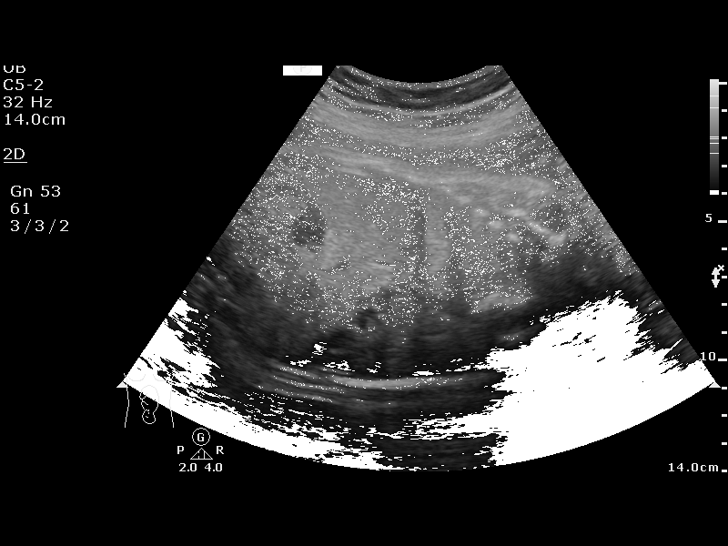
[im 9/13]
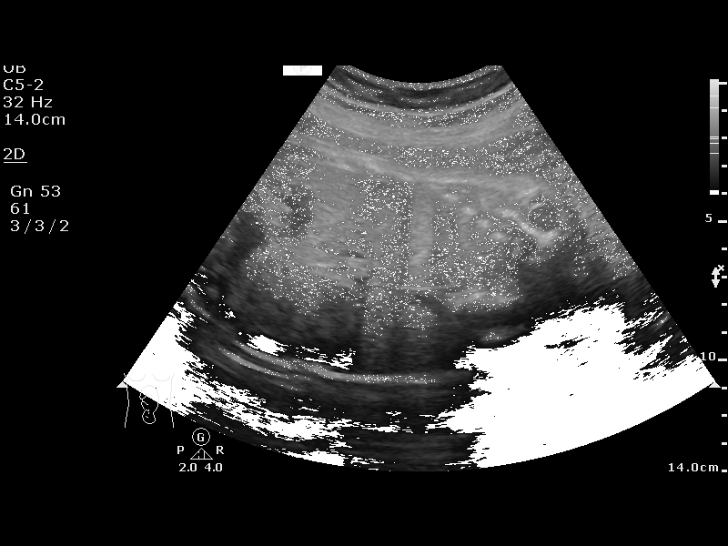
[im 10/13]
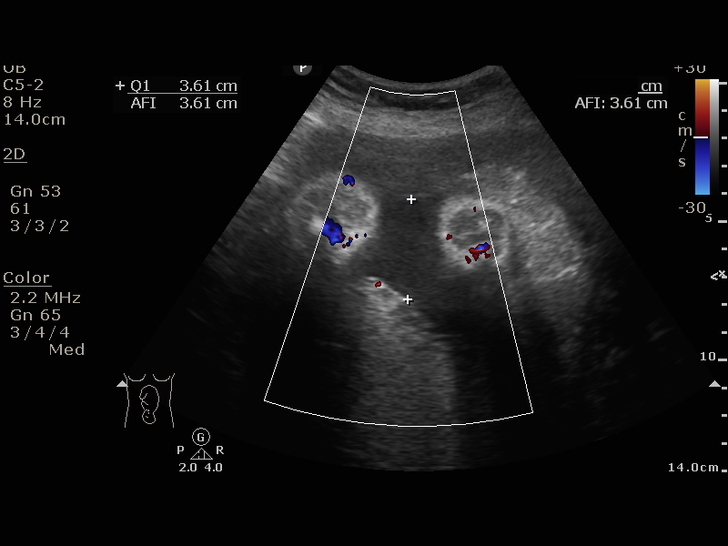
[im 11/13]
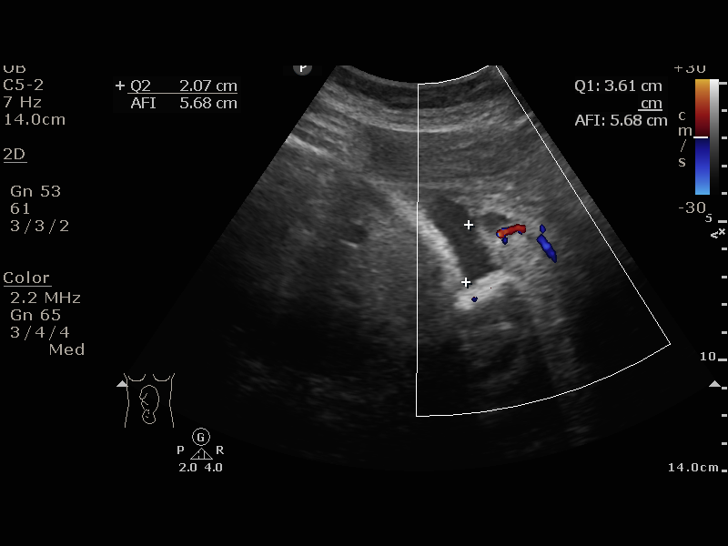
[im 12/13]
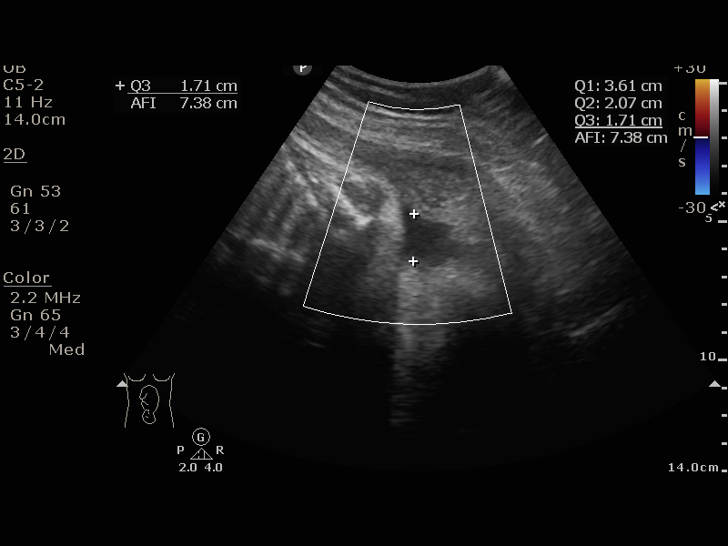
[im 13/13]
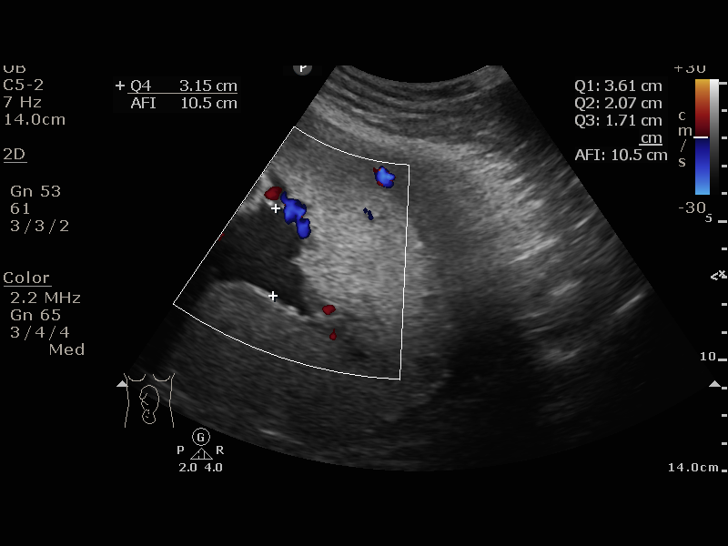

[13 of 13 positions shown; findings below may reference images not displayed]

Attending:        Marthe Pecoraro         [facility]         [facility]
[REDACTED]

1  US FETAL BPP W/NONSTRESS                    76818.4

1  MD FEROZ ZABBER           [id number]      [id number]     [id number]
Service(s) Provided

Indications

34 weeks gestation of pregnancy
Advanced maternal age multigravida 35+,
third trimester
Unspecified pre-existing hypertension
complicating pregnancy, third trimester
OB History

Blood Type:            Height:  5'4"   Weight (lb):  175       BMI:
Gravidity:    7         Term:   2        Prem:   1        SAB:   3
Living:       3
Fetal Evaluation

Num Of Fetuses:     1
Preg. Location:     Intrauterine
Cardiac Activity:   Observed
Presentation:       Cephalic

Amniotic Fluid
AFI FV:      Subjectively within normal limits

AFI Sum(cm)     %Tile       Largest Pocket(cm)
10.54           23

RUQ(cm)       RLQ(cm)       LUQ(cm)        LLQ(cm)
3.61
Biophysical Evaluation

Amniotic F.V:   Pocket => 2 cm two         F. Tone:        Observed
planes
F. Movement:    Observed                   N.S.T:          Reactive
F. Breathing:   Observed                   Score:          [DATE]
Gestational Age

LMP:           34w 1d        Date:  06/29/16                 EDD:   04/05/17
Best:          34w 1d     Det. By:  LMP  (06/29/16)          EDD:   04/05/17
Impression

IUP at  81w3d
Normal amniotic fluid volume
Recommendations

Continue recommended antenatal testing

## 2019-07-12 ENCOUNTER — Other Ambulatory Visit: Payer: Self-pay | Admitting: Endocrinology

## 2019-07-27 DIAGNOSIS — M129 Arthropathy, unspecified: Secondary | ICD-10-CM | POA: Diagnosis not present

## 2019-07-27 DIAGNOSIS — K529 Noninfective gastroenteritis and colitis, unspecified: Secondary | ICD-10-CM | POA: Diagnosis not present

## 2019-07-27 DIAGNOSIS — Z79899 Other long term (current) drug therapy: Secondary | ICD-10-CM | POA: Diagnosis not present

## 2019-07-27 DIAGNOSIS — M7552 Bursitis of left shoulder: Secondary | ICD-10-CM | POA: Diagnosis not present

## 2019-07-27 DIAGNOSIS — H209 Unspecified iridocyclitis: Secondary | ICD-10-CM | POA: Diagnosis not present

## 2019-09-15 ENCOUNTER — Other Ambulatory Visit: Payer: Self-pay | Admitting: Internal Medicine

## 2019-09-20 ENCOUNTER — Telehealth: Payer: Self-pay

## 2019-09-20 NOTE — Telephone Encounter (Signed)
Requesting to speak with a nurse about cyanocobalamin (,VITAMIN B-12,) 1000 MCG/ML injection. Please call pt back.

## 2019-09-20 NOTE — Telephone Encounter (Signed)
Pt has not been seen since feb 2021, appt made for pt for md visit and labs

## 2019-09-29 ENCOUNTER — Ambulatory Visit: Payer: Medicaid Other | Admitting: Internal Medicine

## 2019-09-29 ENCOUNTER — Other Ambulatory Visit: Payer: Self-pay

## 2019-09-29 ENCOUNTER — Encounter: Payer: Self-pay | Admitting: Internal Medicine

## 2019-09-29 VITALS — BP 135/97 | HR 81 | Temp 98.1°F | Ht 64.0 in | Wt 182.3 lb

## 2019-09-29 DIAGNOSIS — E538 Deficiency of other specified B group vitamins: Secondary | ICD-10-CM

## 2019-09-29 MED ORDER — CYANOCOBALAMIN 1000 MCG/ML IJ SOLN: 1000.0000 ug | INTRAMUSCULAR | 3 refills | Status: DC

## 2019-09-29 NOTE — Progress Notes (Signed)
CC: B12 deficiency   HPI:  [name] is a 44 y.o. female with a past medical history stated below and presents today for B12 deficiency. Please see problem based assessment and plan for additional details.  Past Medical History:  Diagnosis Date  . Asthma    mild by PFT's 03/16/1998  . Cervical lymphadenopathy    biopsy negativeMarvetta Gibbons)  . Chronic hypertension   . Complete spontaneous abortion    x2  . GERD (gastroesophageal reflux disease)   . History of abnormal Pap smear    s/p cryo( Dr. Wiliam Ke)  . HSV (herpes simplex virus) anogenital infection   . Hypertension   . Hypothyroidism   . Kidney stone   . Normal vaginal delivery    x3  . Rheumatoid arthritis (HCC)   . Thyroid disease affecting pregnancy   . 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   . Vaginal Pap smear, abnormal   . Vitamin B12 deficiency     Current Outpatient Medications on File Prior to Visit  Medication Sig Dispense Refill  . acetaminophen (TYLENOL) 500 MG tablet Take 1,000 mg by mouth every 6 (six) hours as needed for mild pain, moderate pain or headache.    . albuterol (PROVENTIL HFA;VENTOLIN HFA) 108 (90 Base) MCG/ACT inhaler Inhale 1-2 puffs into the lungs every 6 (six) hours as needed for wheezing or shortness of breath. 1 Inhaler 1  . amLODipine (NORVASC) 10 MG tablet TAKE 1 TABLET(10 MG) BY MOUTH DAILY (Patient taking differently: Take 10 mg by mouth daily. ) 90 tablet 1  . Chlorpheniramine-DM (CORICIDIN HBP COUGH/COLD PO) Take 1 tablet by mouth daily as needed (for cough).     . doxycycline (VIBRAMYCIN) 100 MG capsule Take 1 capsule (100 mg total) by mouth 2 (two) times daily. 14 capsule 2  . fluticasone (FLONASE) 50 MCG/ACT nasal spray Place 2 sprays into both nostrils daily as needed for allergies or rhinitis.    Marland Kitchen loratadine (CLARITIN) 10 MG  tablet Take 10 mg by mouth daily as needed for allergies.    . methimazole (TAPAZOLE) 5 MG tablet TAKE 1 TABLET(5 MG) BY MOUTH DAILY 90 tablet 0  . MULTIPLE VITAMIN PO Take 1 tablet by mouth daily.    Marland Kitchen NORTREL 1/35, 28, tablet TAKE 1 TABLET BY MOUTH DAILY (Patient taking differently: Take 1 tablet by mouth daily. ) 84 tablet 4  . prednisoLONE acetate (PRED FORTE) 1 % ophthalmic suspension Place 1 drop into the right eye daily as needed for irritation.    . triamcinolone cream (KENALOG) 0.1 % Apply 1 application topically 2 (two) times daily.    . valACYclovir (VALTREX) 500 MG tablet TAKE 1 TABLET BY MOUTH ONCE DAILY (Patient taking differently: Take 500 mg by mouth daily as needed (outbreak). ) 30 tablet 1  . vitamin C (ASCORBIC ACID) 250 MG tablet Take 250 mg by mouth daily.     No current facility-administered medications on file prior to visit.    Family History  Problem Relation Age of Onset  . Ulcerative colitis Mother   . Hypertension Mother   . Diabetes Father   . Heart attack Father   . Hypertension Father   . Diabetes Sister   . Thyroid disease Sister   . Heart attack Maternal Grandmother   . Stroke Maternal Grandmother   . Breast cancer Neg Hx  Social History   Socioeconomic History  . Marital status: Single    Spouse name: Not on file  . Number of children: Not on file  . Years of education: Not on file  . Highest education level: Not on file  Occupational History  . Not on file  Tobacco Use  . Smoking status: Never Smoker  . Smokeless tobacco: Never Used  Vaping Use  . Vaping Use: Never used  Substance and Sexual Activity  . Alcohol use: Yes    Alcohol/week: 0.0 standard drinks    Comment: Sometimes.  . Drug use: No  . Sexual activity: Yes    Birth control/protection: Pill  Other Topics Concern  . Not on file  Social History Narrative  . Not on file   Social Determinants of Health   Financial Resource Strain:   . Difficulty of Paying Living  Expenses:   Food Insecurity:   . Worried About Programme researcher, broadcasting/film/video in the Last Year:   . Barista in the Last Year:   Transportation Needs:   . Freight forwarder (Medical):   Marland Kitchen Lack of Transportation (Non-Medical):   Physical Activity:   . Days of Exercise per Week:   . Minutes of Exercise per Session:   Stress:   . Feeling of Stress :   Social Connections:   . Frequency of Communication with Friends and Family:   . Frequency of Social Gatherings with Friends and Family:   . Attends Religious Services:   . Active Member of Clubs or Organizations:   . Attends Banker Meetings:   Marland Kitchen Marital Status:   Intimate Partner Violence:   . Fear of Current or Ex-Partner:   . Emotionally Abused:   Marland Kitchen Physically Abused:   . Sexually Abused:     Review of Systems: ROS negative except for what is noted on the assessment and plan.  Vitals:   09/29/19 1348  BP: (!) 135/97  Pulse: 81  Temp: 98.1 F (36.7 C)  TempSrc: Oral  SpO2: 100%  Weight: 182 lb 4.8 oz (82.7 kg)  Height: 5\' 4"  (1.626 m)     Physical Exam: Physical Exam Constitutional:      Appearance: Normal appearance.  HENT:     Head: Normocephalic and atraumatic.  Eyes:     Extraocular Movements: Extraocular movements intact.  Cardiovascular:     Rate and Rhythm: Normal rate.     Pulses: Normal pulses.  Pulmonary:     Effort: No respiratory distress.  Abdominal:     Palpations: Abdomen is soft.     Tenderness: There is no abdominal tenderness.  Musculoskeletal:        General: Normal range of motion.     Cervical back: Normal range of motion.     Right lower leg: No edema.     Left lower leg: No edema.  Skin:    General: Skin is warm and dry.  Neurological:     Mental Status: She is alert and oriented to person, place, and time. Mental status is at baseline.  Psychiatric:        Mood and Affect: Mood normal.      Assessment & Plan:   See Encounters Tab for problem based  charting.  Patient discussed with Dr. , D.O. [facility], PGY-2 Pager: [phone], Phone: [phone] Date 09/30/2019 Time 8:29 AM

## 2019-09-29 NOTE — Patient Instructions (Signed)
Thank you, Sue [name] for allowing [place] to provide your care today. Today we discussed B12.    I have ordered the following labs for you:   Lab Orders     Vitamin B12   I will call if any are abnormal. All of your labs can be accessed through "My Chart".   I have ordered the following medication/changed the following medications:  1. Refilled B12  Please follow-up with PCP  Should you have any questions or concerns please call the internal medicine clinic at [phone].    Dellia Cloud, D.O. [facility]   My Chart Access: [email or web address]   If you have not already done so, please get your COVID 19 vaccine  To schedule an appointment for a COVID vaccine choice any of the following: Go to [email or web address]   Go to [email or web address]                  Call [phone]                                     Call [phone] and select Option 2

## 2019-09-30 ENCOUNTER — Encounter: Payer: Self-pay | Admitting: Internal Medicine

## 2019-09-30 LAB — VITAMIN B12: Vitamin B-12: 355 pg/mL (ref 232–1245)

## 2019-09-30 NOTE — Assessment & Plan Note (Signed)
Patient presents for reevaluation of her B12 deficiency.  Patient is currently on IM injections monthly for her B12 deficiency.  She administers these injections herself and states that she is tolerating them well.  She denies any symptoms of B12 deficiency including fatigue, weakness, ataxia, lower extremity paresthesias.  Plan: 1.  Refill patient's B12 2.  Retest her B12 level today.

## 2019-10-03 NOTE — Progress Notes (Signed)
Internal Medicine Clinic Attending  Case discussed with Dr. Coe  At the time of the visit.  We reviewed the resident's history and exam and pertinent patient test results.  I agree with the assessment, diagnosis, and plan of care documented in the resident's note.  

## 2019-10-08 ENCOUNTER — Other Ambulatory Visit: Payer: Self-pay | Admitting: Internal Medicine

## 2019-10-08 DIAGNOSIS — I10 Essential (primary) hypertension: Secondary | ICD-10-CM

## 2019-10-11 ENCOUNTER — Other Ambulatory Visit: Payer: Self-pay | Admitting: Endocrinology

## 2019-10-25 ENCOUNTER — Other Ambulatory Visit: Payer: Self-pay | Admitting: Obstetrics & Gynecology

## 2019-10-25 DIAGNOSIS — A6009 Herpesviral infection of other urogenital tract: Secondary | ICD-10-CM

## 2019-10-27 ENCOUNTER — Ambulatory Visit (INDEPENDENT_AMBULATORY_CARE_PROVIDER_SITE_OTHER): Payer: Medicaid Other | Admitting: Endocrinology

## 2019-10-27 ENCOUNTER — Other Ambulatory Visit: Payer: Self-pay

## 2019-10-27 ENCOUNTER — Encounter: Payer: Self-pay | Admitting: Endocrinology

## 2019-10-27 VITALS — BP 122/78 | HR 88 | Ht 64.0 in | Wt 184.2 lb

## 2019-10-27 DIAGNOSIS — E0590 Thyrotoxicosis, unspecified without thyrotoxic crisis or storm: Secondary | ICD-10-CM | POA: Diagnosis not present

## 2019-10-27 LAB — T4, FREE: Free T4: 1.1 ng/dL (ref 0.8–1.8)

## 2019-10-27 LAB — TSH: TSH: 0.71 mIU/L

## 2019-10-27 NOTE — Patient Instructions (Addendum)
Blood tests are requested for you today.  We'll let you know about the results.  If ever you have fever while taking methimazole, stop it and call us, even if the reason is obvious, because of the risk of a rare side-effect.  It is best to never miss the medication.  However, if you do miss it, next best is to double up the next time.   Please come back for a follow-up appointment in 5 months.   

## 2019-10-27 NOTE — Progress Notes (Signed)
Subjective:    Patient ID: Sue [name], female    DOB: [birth year], 44 y.o.   MRN: [id number]  HPI Pt returns for f/u of hyperthyroidism (dx'ed 2010; she has never had dedicated thyroid imaging, but 2014 CT showed 6 x 5 mm nodule in the left lobe of the thyroid; hyperthyroidism was mild, but she was still rx'ed with tapazole, due to poor wt gain; fiancee has had vasectomy).  She takes tapazole as rx'ed.  She has regained a few lbs.   Past Medical History:  Diagnosis Date  . Asthma    mild by PFT's 03/16/1998  . Cervical lymphadenopathy    biopsy negativeMarvetta Gibbons)  . Chronic hypertension   . Complete spontaneous abortion    x2  . GERD (gastroesophageal reflux disease)   . History of abnormal Pap smear    s/p cryo( Dr. Wiliam Ke)  . HSV (herpes simplex virus) anogenital infection   . Hypertension   . Hypothyroidism   . Kidney stone   . Normal vaginal delivery    x3  . Rheumatoid arthritis (HCC)   . Thyroid disease affecting pregnancy   . 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   . Vaginal Pap smear, abnormal   . Vitamin B12 deficiency     Past Surgical History:  Procedure Laterality Date  . biopsy of lymph node in R neck  2007.  Marland Kitchen LAPAROSCOPIC APPENDECTOMY N/A 05/08/2019   Procedure: APPENDECTOMY LAPAROSCOPIC WITH DIAGNOSTIC LAPAROSCOPY;  Surgeon: [name], MD;  Location: [facility];  Service: General;  Laterality: N/A;  . LEEP      Social History   Socioeconomic History  . Marital status: Single    Spouse name: Not on file  . Number of children: Not on file  . Years of education: Not on file  . Highest education level: Not on file  Occupational History  . Not on file  Tobacco Use  . Smoking status: Never Smoker  . Smokeless tobacco: Never Used  Vaping Use  . Vaping Use: Never used  Substance and Sexual Activity   . Alcohol use: Yes    Alcohol/week: 0.0 standard drinks    Comment: Sometimes.  . Drug use: No  . Sexual activity: Yes    Birth control/protection: Pill  Other Topics Concern  . Not on file  Social History Narrative  . Not on file   Social Determinants of Health   Financial Resource Strain:   . Difficulty of Paying Living Expenses: Not on file  Food Insecurity:   . Worried About Programme researcher, broadcasting/film/video in the Last Year: Not on file  . Ran Out of Food in the Last Year: Not on file  Transportation Needs:   . Lack of Transportation (Medical): Not on file  . Lack of Transportation (Non-Medical): Not on file  Physical Activity:   . Days of Exercise per Week: Not on file  . Minutes of Exercise per Session: Not on file  Stress:   . Feeling of Stress : Not on file  Social Connections:   . Frequency of Communication with Friends and Family: Not on file  . Frequency of Social Gatherings with Friends and Family: Not on file  . Attends Religious Services: Not on file  . Active Member of Clubs or Organizations: Not on file  . Attends Banker Meetings: Not on file  .  Marital Status: Not on file  Intimate Partner Violence:   . Fear of Current or Ex-Partner: Not on file  . Emotionally Abused: Not on file  . Physically Abused: Not on file  . Sexually Abused: Not on file    Current Outpatient Medications on File Prior to Visit  Medication Sig Dispense Refill  . acetaminophen (TYLENOL) 500 MG tablet Take 1,000 mg by mouth every 6 (six) hours as needed for mild pain, moderate pain or headache.    . Adalimumab (HUMIRA PEN) 40 MG/0.4ML PNKT Inject into the skin. Every Friday    . albuterol (PROVENTIL HFA;VENTOLIN HFA) 108 (90 Base) MCG/ACT inhaler Inhale 1-2 puffs into the lungs every 6 (six) hours as needed for wheezing or shortness of breath. 1 Inhaler 1  . amLODipine (NORVASC) 10 MG tablet TAKE 1 TABLET(10 MG) BY MOUTH DAILY 90 tablet 1  . cyanocobalamin (,VITAMIN B-12,) 1000  MCG/ML injection Inject 1 mL (1,000 mcg total) into the muscle every 30 (thirty) days. 1 mL 3  . fluticasone (FLONASE) 50 MCG/ACT nasal spray Place 2 sprays into both nostrils daily as needed for allergies or rhinitis.    . methimazole (TAPAZOLE) 5 MG tablet TAKE 1 TABLET(5 MG) BY MOUTH DAILY 90 tablet 0  . MULTIPLE VITAMIN PO Take 1 tablet by mouth daily.    Marland Kitchen NORTREL 1/35, 28, tablet TAKE 1 TABLET BY MOUTH DAILY (Patient taking differently: Take 1 tablet by mouth daily. ) 84 tablet 4  . triamcinolone cream (KENALOG) 0.1 % Apply 1 application topically 2 (two) times daily.    . valACYclovir (VALTREX) 500 MG tablet TAKE 1 TABLET BY MOUTH ONCE DAILY (Patient taking differently: Take 500 mg by mouth daily as needed (outbreak). ) 30 tablet 1  . Chlorpheniramine-DM (CORICIDIN HBP COUGH/COLD PO) Take 1 tablet by mouth daily as needed (for cough).     . doxycycline (VIBRAMYCIN) 100 MG capsule Take 1 capsule (100 mg total) by mouth 2 (two) times daily. 14 capsule 2  . loratadine (CLARITIN) 10 MG tablet Take 10 mg by mouth daily as needed for allergies.    . prednisoLONE acetate (PRED FORTE) 1 % ophthalmic suspension Place 1 drop into the right eye daily as needed for irritation.    . vitamin C (ASCORBIC ACID) 250 MG tablet Take 250 mg by mouth daily.     No current facility-administered medications on file prior to visit.    Allergies  Allergen Reactions  . Ciprofloxacin Anaphylaxis and Swelling  . Clindamycin/Lincomycin Anaphylaxis and Swelling  . Sulfamethoxazole-Trimethoprim Swelling    Swelling is of the lips  . Amoxicillin Rash    Has patient had a PCN reaction causing immediate rash, facial/tongue/throat swelling, SOB or lightheadedness with hypotension: no Has patient had a PCN reaction causing severe rash involving mucus membranes or skin necrosis: pt had rash -- non severe  Has patient had a PCN reaction that required hospitalization: no Has patient had a PCN reaction occurring within the  last 10 years: no If all of the above answers are "NO", then may proceed with Cephalosporin use.     Family History  Problem Relation Age of Onset  . Ulcerative colitis Mother   . Hypertension Mother   . Diabetes Father   . Heart attack Father   . Hypertension Father   . Diabetes Sister   . Thyroid disease Sister   . Heart attack Maternal Grandmother   . Stroke Maternal Grandmother   . Breast cancer Neg Hx  BP 122/78 (BP Location: Left Arm, Patient Position: Sitting, Cuff Size: Large)   Pulse 88   Ht 5\' 4"  (1.626 m)   Wt 184 lb 3.2 oz (83.6 kg)   SpO2 96%   BMI 31.62 kg/m    Review of Systems Denies fever.      Objective:   Physical Exam VITAL SIGNS:  See vs page GENERAL: no distress NECK: There is no palpable thyroid enlargement.  No thyroid nodule is palpable.  No palpable lymphadenopathy at the anterior neck.     Lab Results  Component Value Date   TSH 0.71 10/27/2019   T4TOTAL 12.3 (H) 10/06/2016       Assessment & Plan:  Hyperthyroidism: well-controlled.  Please continue the same medication

## 2019-11-03 ENCOUNTER — Other Ambulatory Visit (HOSPITAL_COMMUNITY)
Admission: RE | Admit: 2019-11-03 | Discharge: 2019-11-03 | Disposition: A | Discharge status: Home / Self Care | Payer: Medicaid Other | Source: Ambulatory Visit | Attending: Obstetrics | Admitting: Obstetrics

## 2019-11-03 ENCOUNTER — Ambulatory Visit (INDEPENDENT_AMBULATORY_CARE_PROVIDER_SITE_OTHER): Payer: Medicaid Other | Admitting: Obstetrics

## 2019-11-03 ENCOUNTER — Encounter: Payer: Self-pay | Admitting: Obstetrics

## 2019-11-03 ENCOUNTER — Other Ambulatory Visit: Payer: Self-pay

## 2019-11-03 VITALS — BP 128/88 | HR 77 | Ht 64.0 in | Wt 185.3 lb

## 2019-11-03 DIAGNOSIS — Z1239 Encounter for other screening for malignant neoplasm of breast: Secondary | ICD-10-CM | POA: Diagnosis not present

## 2019-11-03 DIAGNOSIS — Z01419 Encounter for gynecological examination (general) (routine) without abnormal findings: Secondary | ICD-10-CM | POA: Insufficient documentation

## 2019-11-03 DIAGNOSIS — L0293 Carbuncle, unspecified: Secondary | ICD-10-CM | POA: Diagnosis not present

## 2019-11-03 MED ORDER — DOXYCYCLINE HYCLATE 100 MG PO CAPS: 100.0000 mg | ORAL_CAPSULE | Freq: Two times a day (BID) | ORAL | 2 refills | Status: DC

## 2019-11-03 NOTE — Progress Notes (Signed)
Subjective:        Sue [name] is a 44 y.o. female here for a routine exam.  Current complaints: Boil on vulva..    Personal health questionnaire:  Is patient [name], have a family history of breast and/or ovarian cancer: no Is there a family history of uterine cancer diagnosed at age < 51, gastrointestinal cancer, urinary tract cancer, family member who is a Personnel officer syndrome-associated carrier: no Is the patient overweight and hypertensive, family history of diabetes, personal history of gestational diabetes, preeclampsia or PCOS: no Is patient over 44, have PCOS,  family history of premature CHD under age 87, diabetes, smoke, have hypertension or peripheral artery disease:  no At any time, has a partner hit, kicked or otherwise hurt or frightened you?: no Over the past 2 weeks, have you felt down, depressed or hopeless?: no Over the past 2 weeks, have you felt little interest or pleasure in doing things?:no   Gynecologic History Patient's last menstrual period was 10/17/2019. Contraception: OCP (estrogen/progesterone) Last Pap: 10-17-2018. Results were: normal Last mammogram: 12-02-2018. Results were: normal  Obstetric History OB History  Gravida Para Term Preterm AB Living  7 4 3 1 3 4   SAB TAB Ectopic Multiple Live Births  3     0 4    # Outcome Date GA Lbr Len/2nd Weight Sex Delivery Anes PTL Lv  7 Term 03/30/17 [redacted]w[redacted]d 04:02 / 00:08 7 lb 4 oz (3.289 kg) F Vag-Spont EPI  LIV  6 Term 07/06/05 [redacted]w[redacted]d  6 lb 12 oz (3.062 kg) M Vag-Spont None  LIV  5 SAB 2006 [redacted]w[redacted]d       DEC  4 Term 03/27/96 [redacted]w[redacted]d  7 lb 12 oz (3.515 kg) M Vag-Spont None  LIV  3 SAB 1996        DEC  2 Preterm 02/17/92 [redacted]w[redacted]d  3 lb 11 oz (1.673 kg) M Vag-Spont None  LIV     Birth Comments: pre eclampsia  1 SAB 1992        DEC    Past Medical History:  Diagnosis Date  . Asthma    mild by PFT's 03/16/1998  . Cervical lymphadenopathy    biopsy negative3/10/1998)  . Chronic hypertension   . Complete  spontaneous abortion    x2  . GERD (gastroesophageal reflux disease)   . History of abnormal Pap smear    s/p cryo( Dr. [name]  . HSV (herpes simplex virus) anogenital infection   . Hypertension   . Hypothyroidism   . Kidney stone   . Normal vaginal delivery    x3  . Rheumatoid arthritis (HCC)   . Thyroid disease affecting pregnancy   . 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   . Vaginal Pap smear, abnormal   . Vitamin B12 deficiency     Past Surgical History:  Procedure Laterality Date  . biopsy of lymph node in R neck  2007.  2008 LAPAROSCOPIC APPENDECTOMY N/A 05/08/2019   Procedure: APPENDECTOMY LAPAROSCOPIC WITH DIAGNOSTIC LAPAROSCOPY;  Surgeon: 07/08/2019, MD;  Location: [facility];  Service: General;  Laterality: N/A;  . LEEP       Current Outpatient Medications:  .  acetaminophen (TYLENOL) 500 MG tablet, Take 1,000 mg by mouth every 6 (six) hours as needed for mild pain, moderate pain or headache., Disp: , Rfl:  .  Adalimumab (  HUMIRA PEN) 40 MG/0.4ML PNKT, Inject into the skin. Every Friday, Disp: , Rfl:  .  albuterol (PROVENTIL HFA;VENTOLIN HFA) 108 (90 Base) MCG/ACT inhaler, Inhale 1-2 puffs into the lungs every 6 (six) hours as needed for wheezing or shortness of breath., Disp: 1 Inhaler, Rfl: 1 .  amLODipine (NORVASC) 10 MG tablet, TAKE 1 TABLET(10 MG) BY MOUTH DAILY, Disp: 90 tablet, Rfl: 1 .  cyanocobalamin (,VITAMIN B-12,) 1000 MCG/ML injection, Inject 1 mL (1,000 mcg total) into the muscle every 30 (thirty) days., Disp: 1 mL, Rfl: 3 .  fluticasone (FLONASE) 50 MCG/ACT nasal spray, Place 2 sprays into both nostrils daily as needed for allergies or rhinitis., Disp: , Rfl:  .  methimazole (TAPAZOLE) 5 MG tablet, TAKE 1 TABLET(5 MG) BY MOUTH DAILY, Disp: 90 tablet, Rfl: 0 .  MULTIPLE VITAMIN PO, Take 1 tablet by mouth  daily., Disp: , Rfl:  .  NORTREL 1/35, 28, tablet, TAKE 1 TABLET BY MOUTH DAILY (Patient taking differently: Take 1 tablet by mouth daily. ), Disp: 84 tablet, Rfl: 4 .  triamcinolone cream (KENALOG) 0.1 %, Apply 1 application topically 2 (two) times daily., Disp: , Rfl:  .  valACYclovir (VALTREX) 500 MG tablet, TAKE 1 TABLET BY MOUTH ONCE DAILY (Patient taking differently: Take 500 mg by mouth daily as needed (outbreak). ), Disp: 30 tablet, Rfl: 1 .  Chlorpheniramine-DM (CORICIDIN HBP COUGH/COLD PO), Take 1 tablet by mouth daily as needed (for cough). , Disp: , Rfl:  .  doxycycline (VIBRAMYCIN) 100 MG capsule, Take 1 capsule (100 mg total) by mouth 2 (two) times daily., Disp: 14 capsule, Rfl: 2 .  doxycycline (VIBRAMYCIN) 100 MG capsule, Take 1 capsule (100 mg total) by mouth 2 (two) times daily., Disp: 14 capsule, Rfl: 2 .  loratadine (CLARITIN) 10 MG tablet, Take 10 mg by mouth daily as needed for allergies., Disp: , Rfl:  .  prednisoLONE acetate (PRED FORTE) 1 % ophthalmic suspension, Place 1 drop into the right eye daily as needed for irritation., Disp: , Rfl:  .  vitamin C (ASCORBIC ACID) 250 MG tablet, Take 250 mg by mouth daily., Disp: , Rfl:  Allergies  Allergen Reactions  . Ciprofloxacin Anaphylaxis and Swelling  . Clindamycin/Lincomycin Anaphylaxis and Swelling  . Sulfamethoxazole-Trimethoprim Swelling    Swelling is of the lips  . Amoxicillin Rash    Has patient had a PCN reaction causing immediate rash, facial/tongue/throat swelling, SOB or lightheadedness with hypotension: no Has patient had a PCN reaction causing severe rash involving mucus membranes or skin necrosis: pt had rash -- non severe  Has patient had a PCN reaction that required hospitalization: no Has patient had a PCN reaction occurring within the last 10 years: no If all of the above answers are "NO", then may proceed with Cephalosporin use.     Social History   Tobacco Use  . Smoking status: Never Smoker  .  Smokeless tobacco: Never Used  Substance Use Topics  . Alcohol use: Yes    Alcohol/week: 0.0 standard drinks    Comment: Sometimes.    Family History  Problem Relation Age of Onset  . Ulcerative colitis Mother   . Hypertension Mother   . Diabetes Father   . Heart attack Father   . Hypertension Father   . Diabetes Sister   . Thyroid disease Sister   . Heart attack Maternal Grandmother   . Stroke Maternal Grandmother   . Breast cancer Neg Hx       Review of Systems  Constitutional: negative for fatigue and weight loss Respiratory: negative for cough and wheezing Cardiovascular: negative for chest pain, fatigue and palpitations Gastrointestinal: negative for abdominal pain and change in bowel habits Musculoskeletal:negative for myalgias Neurological: negative for gait problems and tremors Behavioral/Psych: negative for abusive relationship, depression Endocrine: negative for temperature intolerance    Genitourinary:negative for abnormal menstrual periods,  hot flashes, sexual problems and vaginal discharge.  Positive for left vulva boil Integument/breast: negative for breast lump, breast tenderness, nipple discharge and skin lesion(s)    Objective:       BP 128/88   Pulse 77   Ht 5\' 4"  (1.626 m)   Wt 185 lb 4.8 oz (84.1 kg)   LMP 10/17/2019   BMI 31.81 kg/m  General:   alert and no distress  Skin:   no rash or abnormalities  Lungs:   clear to auscultation bilaterally  Heart:   regular rate and rhythm, S1, S2 normal, no murmur, click, rub or gallop  Breasts:   normal without suspicious masses, skin or nipple changes or axillary nodes  Abdomen:  normal findings: no organomegaly, soft, non-tender and no hernia  Pelvis:  External genitalia: healing boil left vulva / groin interface Urinary system: urethral meatus normal and bladder without fullness, nontender Vaginal: normal without tenderness, induration or masses Cervix: normal appearance Adnexa: normal bimanual  exam Uterus: anteverted and non-tender, normal size   Lab Review Urine pregnancy test Labs reviewed yes Radiologic studies reviewed yes  50% of 20 min visit spent on counseling and coordination of care.   Assessment:     1. Encounter for gynecological examination with Papanicolaou smear of cervix  2. Recurrent boils - left vulva / groin area Rx: - doxycycline (VIBRAMYCIN) 100 MG capsule; Take 1 capsule (100 mg total) by mouth 2 (two) times daily.  Dispense: 14 capsule; Refill: 2  3. Screening breast examination Rx: - MM Digital Screening; Future    Plan:    Education reviewed: calcium supplements, depression evaluation, low fat, low cholesterol diet, safe sex/STD prevention, self breast exams and weight bearing exercise. Contraception: OCP (estrogen/progesterone). Follow up in: 1 year.   Meds ordered this encounter  Medications  . doxycycline (VIBRAMYCIN) 100 MG capsule    Sig: Take 1 capsule (100 mg total) by mouth 2 (two) times daily.    Dispense:  14 capsule    Refill:  2    12/17/2019, MD 11/03/2019 9:15 AM

## 2019-11-03 NOTE — Addendum Note (Signed)
Addended by: Leola Brazil on: 11/03/2019 12:06 PM   Modules accepted: Orders

## 2019-11-03 NOTE — Progress Notes (Signed)
Patient presents for vaginal boil that has been present since last week. She states that she has been having recurrent boils. It is located near her left panty line. She did notice some drainage this past Tuesday night. She has tried soaking with epsom salt bath with some relief.

## 2019-11-06 LAB — CYTOLOGY - PAP
Comment: NEGATIVE
Diagnosis: NEGATIVE
High risk HPV: NEGATIVE

## 2019-11-07 ENCOUNTER — Ambulatory Visit: Payer: Medicaid Other | Admitting: Certified Nurse Midwife

## 2019-11-07 DIAGNOSIS — L658 Other specified nonscarring hair loss: Secondary | ICD-10-CM | POA: Diagnosis not present

## 2019-11-07 DIAGNOSIS — L308 Other specified dermatitis: Secondary | ICD-10-CM | POA: Diagnosis not present

## 2019-11-07 DIAGNOSIS — L821 Other seborrheic keratosis: Secondary | ICD-10-CM | POA: Diagnosis not present

## 2019-11-21 ENCOUNTER — Other Ambulatory Visit: Payer: Self-pay | Admitting: Obstetrics & Gynecology

## 2019-11-21 DIAGNOSIS — A6009 Herpesviral infection of other urogenital tract: Secondary | ICD-10-CM

## 2019-12-01 ENCOUNTER — Telehealth: Payer: Self-pay

## 2019-12-01 ENCOUNTER — Other Ambulatory Visit: Payer: Self-pay

## 2019-12-01 DIAGNOSIS — A6009 Herpesviral infection of other urogenital tract: Secondary | ICD-10-CM

## 2019-12-01 NOTE — Telephone Encounter (Signed)
Pt called and would like to know if she can have a refill on Valtrex, please advise.

## 2019-12-03 ENCOUNTER — Other Ambulatory Visit: Payer: Self-pay | Admitting: Obstetrics

## 2019-12-03 DIAGNOSIS — A6009 Herpesviral infection of other urogenital tract: Secondary | ICD-10-CM

## 2019-12-03 MED ORDER — VALACYCLOVIR HCL 500 MG PO TABS: ORAL_TABLET | ORAL | 11 refills | Status: DC

## 2019-12-03 NOTE — Telephone Encounter (Signed)
Valtrex Rx 

## 2019-12-14 ENCOUNTER — Other Ambulatory Visit: Payer: Self-pay | Admitting: Internal Medicine

## 2019-12-15 DIAGNOSIS — L658 Other specified nonscarring hair loss: Secondary | ICD-10-CM | POA: Diagnosis not present

## 2019-12-15 NOTE — Telephone Encounter (Signed)
Prescription denied.  Patient no longer under providers care.//AB/CMA

## 2019-12-22 DIAGNOSIS — L669 Cicatricial alopecia, unspecified: Secondary | ICD-10-CM | POA: Diagnosis not present

## 2019-12-22 DIAGNOSIS — L732 Hidradenitis suppurativa: Secondary | ICD-10-CM | POA: Diagnosis not present

## 2019-12-25 DIAGNOSIS — L669 Cicatricial alopecia, unspecified: Secondary | ICD-10-CM | POA: Diagnosis not present

## 2019-12-29 ENCOUNTER — Ambulatory Visit
Admission: RE | Admit: 2019-12-29 | Discharge: 2019-12-29 | Disposition: A | Discharge status: Home / Self Care | Payer: Medicaid Other | Source: Ambulatory Visit | Attending: Obstetrics | Admitting: Obstetrics

## 2019-12-29 ENCOUNTER — Other Ambulatory Visit: Payer: Self-pay

## 2019-12-29 DIAGNOSIS — Z1231 Encounter for screening mammogram for malignant neoplasm of breast: Secondary | ICD-10-CM | POA: Diagnosis not present

## 2019-12-29 DIAGNOSIS — Z1239 Encounter for other screening for malignant neoplasm of breast: Secondary | ICD-10-CM

## 2020-01-03 ENCOUNTER — Other Ambulatory Visit: Payer: Self-pay | Admitting: Obstetrics

## 2020-01-03 DIAGNOSIS — R928 Other abnormal and inconclusive findings on diagnostic imaging of breast: Secondary | ICD-10-CM

## 2020-01-09 ENCOUNTER — Ambulatory Visit: Payer: Medicaid Other | Admitting: Internal Medicine

## 2020-01-09 ENCOUNTER — Other Ambulatory Visit: Payer: Self-pay

## 2020-01-09 ENCOUNTER — Encounter: Payer: Self-pay | Admitting: Internal Medicine

## 2020-01-09 ENCOUNTER — Other Ambulatory Visit: Payer: Self-pay | Admitting: Endocrinology

## 2020-01-09 VITALS — BP 133/89 | HR 92 | Temp 98.8°F | Ht 64.0 in | Wt 186.0 lb

## 2020-01-09 DIAGNOSIS — R21 Rash and other nonspecific skin eruption: Secondary | ICD-10-CM | POA: Diagnosis not present

## 2020-01-09 DIAGNOSIS — I10 Essential (primary) hypertension: Secondary | ICD-10-CM | POA: Diagnosis not present

## 2020-01-09 DIAGNOSIS — H60502 Unspecified acute noninfective otitis externa, left ear: Secondary | ICD-10-CM | POA: Diagnosis not present

## 2020-01-09 MED ORDER — NEOMYCIN-POLYMYXIN-HC 3.5-10000-1 OT SUSP: 4.0000 [drp] | Freq: Four times a day (QID) | OTIC | 0 refills | Status: DC

## 2020-01-09 NOTE — Patient Instructions (Signed)
[name],  It was a pleasure to see you today. Thank you for coming in.   Today we discussed your rash. Please continue to follow up with the dermatologist and the rheumatologist. You may need a biopsy to evaluate this.   We also discussed your ear pain. It looks like you have an ear infection. Please start using the ear drops, 4 times daily, for the next 7 days.   Please return to clinic in 2-3 months or sooner if needed.   Thank you again for coming in.   Claudean Severance.D.

## 2020-01-09 NOTE — Progress Notes (Signed)
CC: Diffuse rash, left ear pain, HTN  HPI:  [name] is a 44 y.o. with the history listed below presenting for concerns about a skin issue.   Past Medical History:  Diagnosis Date  . Asthma    mild by PFT's 03/16/1998  . Cervical lymphadenopathy    biopsy negativeMarvetta Gibbons)  . Chronic hypertension   . Complete spontaneous abortion    x2  . GERD (gastroesophageal reflux disease)   . History of abnormal Pap smear    s/p cryo( Dr. Wiliam Ke)  . HSV (herpes simplex virus) anogenital infection   . Hypertension   . Hypothyroidism   . Kidney stone   . Normal vaginal delivery    x3  . Rheumatoid arthritis (HCC)   . Thyroid disease affecting pregnancy   . 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   . Vaginal Pap smear, abnormal   . Vitamin B12 deficiency    Review of Systems:   Constitutional: Negative for chills and fever.  Respiratory: Negative for shortness of breath.   Cardiovascular: Negative for chest pain and leg swelling.  Gastrointestinal: Negative for abdominal pain, nausea and vomiting.  Neurological: Negative for dizziness and headaches.  Dermatological: Positive for rash.  Physical Exam:  Vitals:   01/09/20 1544  BP: 133/89  Pulse: 92  Temp: 98.8 F (37.1 C)  TempSrc: Oral  SpO2: 100%  Weight: 186 lb (84.4 kg)  Height: 5\' 4"  (1.626 m)   Physical Exam Constitutional:      Appearance: Normal appearance.  HENT:     Right Ear: Tympanic membrane normal.     Left Ear: Tympanic membrane normal.     Ears:     Comments: Left external canal mild erythematous and edematous, tender, no drainage noted. Right external canal showed trace dark blood, no erythema, edema, or pain noted    Nose: Nose normal.     Mouth/Throat:     Mouth: Mucous membranes are moist.     Pharynx: Oropharynx is clear. No oropharyngeal  exudate or posterior oropharyngeal erythema.  Eyes:     Extraocular Movements: Extraocular movements intact.     Pupils: Pupils are equal, round, and reactive to light.  Cardiovascular:     Rate and Rhythm: Normal rate and regular rhythm.     Pulses: Normal pulses.     Heart sounds: Normal heart sounds.  Pulmonary:     Effort: Pulmonary effort is normal.     Breath sounds: Normal breath sounds.  Abdominal:     General: Abdomen is flat. Bowel sounds are normal.     Palpations: Abdomen is soft.  Musculoskeletal:        General: No swelling. Normal range of motion.     Cervical back: Tenderness (Left cervical tenderness) present.  Lymphadenopathy:     Cervical: No cervical adenopathy.  Skin:    General: Skin is warm and dry.     Capillary Refill: Capillary refill takes less than 2 seconds.     Findings: Rash (Diffuse rash) present.     Comments: Rash: Hands: diffuse plaques that are mildly hyperpigmented. Bilateral hips: dry, white plaques with small whitish macules. Left foot shows a plaque measuring approximately 2-3 cm with central ulceration.  Neurological:     General: No focal deficit present.     Mental Status: She is alert and oriented to person, place, and  time.  Psychiatric:        Mood and Affect: Mood normal.        Behavior: Behavior normal.            Assessment & Plan:   See Encounters Tab for problem based charting.  Patient discussed with Dr. Mikey Bussing

## 2020-01-10 DIAGNOSIS — H6090 Unspecified otitis externa, unspecified ear: Secondary | ICD-10-CM | POA: Insufficient documentation

## 2020-01-10 NOTE — Assessment & Plan Note (Signed)
Patient is currently on Amlodipine 5 mg daily.  Denies any issues taking her medication.  BP Readings from Last 3 Encounters:  01/09/20 133/89  11/03/19 128/88  10/27/19 122/78   BP is well controlled.   -Continue amlodipine 5 mg daily

## 2020-01-10 NOTE — Assessment & Plan Note (Signed)
Patient reports that for the past year she has been having this rash. It initially started on her hands, however then developed on her hips, back, abdomen, legs, and feet. They start as blisters and then they will pop and develop into a rash.  She was referred to dermatology previously, saw [facility] dermatology in Jan 2021 and the were concerned about psoriasis at that time and was tried on triamcinolone cream. She said this did not help.  She denied a biopsy of her scalp and was diagnosed with alopecia.  She then developed rash on her buttocks, legs, and feet.  She then was tried on cortisone cream which he also states did not help.  We do not have the records from her follow-up visit however she states that she was told that she could have a biopsy could be placed 2 months of antibiotics.  Reports being unhappy with the dermatologist, does not feel that her symptoms have improved at all.  She reports having follow-up with rheumatology next month in is planning to see if they could refer her to a different dermatologist in that area.   On exam she has a rash on her palms, hips, and feet (see pictures). On her palms she has diffuse plaques that are mildly hyperpigmented, her hips show dry, white plaques with small whitish macules, and her left foot shows a plaque measuring approximately 2-3 cm with central ulceration.  Unclear etiology, she has multiple autoimmune disorders including ulcerative proctitis, iritis, and inflammatory arthritis, currently on Humira for this. It does seem that this could be related to her autoimmune disorders. Advised patient to continue following up with dermatology for further evaluation and management. Do agree that if patient would like a second opinoin then seeing if rheumatology could refer her to a new dermatologist.

## 2020-01-10 NOTE — Assessment & Plan Note (Signed)
  Patient reports that since Sunday she has been having some left ear pain, left neck pain, sore throat.  She reports taking Tylenol which helps with the pain.  She denies any fevers, chills, nausea, vomiting, cough, sinus pressure, chest pain, shortness of breath, itchy eyes, teary eyes, hearing issues, or drainage out of the left ear.  On exam she is in mild erythema and edema in the external auditory canal, mildly tender on otoscopic exam, tympanic membrane intact with good light reflex.  Appears consistent with an otitis externa. Patient is allergic to ciprofloxacin.   -Cortisporin ear drops

## 2020-01-12 ENCOUNTER — Ambulatory Visit (INDEPENDENT_AMBULATORY_CARE_PROVIDER_SITE_OTHER): Payer: Medicaid Other | Admitting: Internal Medicine

## 2020-01-12 ENCOUNTER — Other Ambulatory Visit: Payer: Self-pay

## 2020-01-12 ENCOUNTER — Encounter: Payer: Medicaid Other | Admitting: Internal Medicine

## 2020-01-12 ENCOUNTER — Encounter: Payer: Self-pay | Admitting: Internal Medicine

## 2020-01-12 ENCOUNTER — Telehealth: Payer: Self-pay | Admitting: *Deleted

## 2020-01-12 VITALS — BP 145/87 | HR 89 | Temp 99.0°F | Ht 64.0 in | Wt 186.4 lb

## 2020-01-12 DIAGNOSIS — J0391 Acute recurrent tonsillitis, unspecified: Secondary | ICD-10-CM | POA: Insufficient documentation

## 2020-01-12 DIAGNOSIS — J029 Acute pharyngitis, unspecified: Secondary | ICD-10-CM

## 2020-01-12 LAB — GROUP A STREP BY PCR: Group A Strep by PCR: NOT DETECTED

## 2020-01-12 MED ORDER — PENICILLIN V POTASSIUM 500 MG PO TABS: 500.0000 mg | ORAL_TABLET | Freq: Three times a day (TID) | ORAL | 0 refills | Status: DC

## 2020-01-12 NOTE — Assessment & Plan Note (Addendum)
Patient reports that since Sunday evening she has been having this left throat pain and difficulty swallowing, she was seen on 11/2 for her diffuse rash, hypertension, and left ear pain, she was helping left neck pain and sore throat at that time as well.  Diagnosed with otitis externa and given eardrops.  Since then she has had worsening sore throat, difficulty swallowing, pain when laying her left side, and deeper voice.  She states she had some whitish discharge in the back of her throat, is now green in nature.  Denies any shortness of breath, cough, neck stiffness, drooling, difficulty swallowing, fevers, chills, or muffled voice.  Denies any known sick contacts.  Reports having history of strep before, is not sure if this is similar.  On exam she has tender left cervical lymphadenopathy, some greenish pharyngeal exudate, and is afebrile.  Scores a 3 on the Cent[employer] criteria.  Will treat empirically for pharyngitis with penicillin, she reports she developed a diffuse rash to amoxicillin, no listed swelling or shortness of breath with this. Discussed that penicillin is similar and to monitor for rash or other symptoms, she was in agreement with this. Tested for rapid antigen strep test.  -Rapid antigen test for strep -Penicillin V 300 mg TID  Addendum:  RADT came back negative. Patient reported to pharmacist that she had severe reaction to the penicillin in the past, with lip swelling and rash. Patient contacted and informed that she did not need antibiotics for now.  Cause for symptoms unclear. Could be mononucleosis. Advised to return to clinic for further testing including mono test, CBC, and BMP. Advised that she can take tylenol or ibuprofen for pain control. Advised that if patient develops any shortness of breath, drooling, stridor, hoarseness, or muffled speech then to go to ER to be evaluated.

## 2020-01-12 NOTE — Patient Instructions (Addendum)
[name],  It was a pleasure to see you today. Thank you for coming in.   Today we discussed your throat pain and trouble swallowing. I am sorry that you are not feeling well. I have done a test for strep infection. Please start taking the antibiotics that I have sent in.   If you start having any shortness of breath, drooling, stiff neck, muffled voice, or worsening symptoms, please come into the ED to be evaluated.   Please return to clinic in 2-3 months or sooner if needed.   Thank you again for coming in.   Claudean Severance.D.

## 2020-01-12 NOTE — Progress Notes (Signed)
   CC: Throat pain and difficulty swallowing  HPI:  Ms.Gurtha Lieurance is a 44 y.o. with history listed below presenting for throat pain and difficulty swallowing.  Past Medical History:  Diagnosis Date  . Asthma    mild by PFT's 03/16/1998  . Cervical lymphadenopathy    biopsy negativeMarvetta Gibbons)  . Chronic hypertension   . Complete spontaneous abortion    x2  . GERD (gastroesophageal reflux disease)   . History of abnormal Pap smear    s/p cryo( Dr. Wiliam Ke)  . HSV (herpes simplex virus) anogenital infection   . Hypertension   . Hypothyroidism   . Kidney stone   . Normal vaginal delivery    x3  . Rheumatoid arthritis (HCC)   . Thyroid disease affecting pregnancy   . 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   . Vaginal Pap smear, abnormal   . Vitamin B12 deficiency    Review of Systems:   Constitutional: Negative for chills and fever.  HEENT: Positive for neck pain, pain with swallowing. Respiratory: Negative for shortness of breath.   Cardiovascular: Negative for chest pain and leg swelling.  Gastrointestinal: Negative for abdominal pain, nausea and vomiting.  Neurological: Negative for dizziness and headaches.    Physical Exam:  Vitals:   01/12/20 1038  BP: (!) 145/87  Pulse: 89  Temp: 99 F (37.2 C)  TempSrc: Oral  SpO2: 100%  Weight: 186 lb 6.4 oz (84.6 kg)  Height: 5\' 4"  (1.626 m)   Physical Exam Constitutional:      Appearance: Normal appearance.  HENT:     Head: Normocephalic and atraumatic.     Nose: Nose normal. No congestion.     Mouth/Throat:     Mouth: Mucous membranes are moist.     Pharynx: Oropharyngeal exudate (Mild greenish exudate on posterior pharynx) present.  Eyes:     Extraocular Movements: Extraocular movements intact.     Pupils: Pupils are equal, round, and reactive to light.    Cardiovascular:     Rate and Rhythm: Normal rate and regular rhythm.  Pulmonary:     Effort: Pulmonary effort is normal.     Breath sounds: Normal breath sounds.  Abdominal:     General: Abdomen is flat. Bowel sounds are normal.     Palpations: Abdomen is soft.  Musculoskeletal:        General: No swelling. Normal range of motion.     Cervical back: Normal range of motion and neck supple.  Lymphadenopathy:     Cervical: Cervical adenopathy (Left sided) present.  Skin:    General: Skin is warm and dry.     Capillary Refill: Capillary refill takes less than 2 seconds.  Neurological:     General: No focal deficit present.     Mental Status: She is alert and oriented to person, place, and time.  Psychiatric:        Mood and Affect: Mood normal.        Behavior: Behavior normal.      Assessment & Plan:   See Encounters Tab for problem based charting.  Patient discussed with Dr. 

## 2020-01-12 NOTE — Telephone Encounter (Signed)
Pt has HI severity PCN, stated to pharmacist that she couldn't hardly breathe and her throat felt swollen, please consider another abx

## 2020-01-12 NOTE — Telephone Encounter (Signed)
Contacted patient. RADT negative, no longer needs antibiotics. Returning for lab visit. Thank you.

## 2020-01-12 NOTE — Progress Notes (Signed)
Internal Medicine Clinic Attending ° °Case discussed with Dr. Krienke  At the time of the visit.  We reviewed the resident’s history and exam and pertinent patient test results.  I agree with the assessment, diagnosis, and plan of care documented in the resident’s note.  °

## 2020-01-14 ENCOUNTER — Other Ambulatory Visit: Payer: Self-pay

## 2020-01-14 ENCOUNTER — Emergency Department (HOSPITAL_COMMUNITY)
Admission: EM | Admit: 2020-01-14 | Discharge: 2020-01-14 | Disposition: A | Discharge status: Home / Self Care | Payer: Medicaid Other | Attending: Emergency Medicine | Admitting: Emergency Medicine

## 2020-01-14 ENCOUNTER — Ambulatory Visit: Payer: Self-pay

## 2020-01-14 ENCOUNTER — Emergency Department (HOSPITAL_COMMUNITY): Payer: Medicaid Other

## 2020-01-14 DIAGNOSIS — E039 Hypothyroidism, unspecified: Secondary | ICD-10-CM | POA: Insufficient documentation

## 2020-01-14 DIAGNOSIS — R070 Pain in throat: Secondary | ICD-10-CM | POA: Diagnosis not present

## 2020-01-14 DIAGNOSIS — J36 Peritonsillar abscess: Secondary | ICD-10-CM | POA: Insufficient documentation

## 2020-01-14 DIAGNOSIS — R1310 Dysphagia, unspecified: Secondary | ICD-10-CM | POA: Diagnosis not present

## 2020-01-14 DIAGNOSIS — E041 Nontoxic single thyroid nodule: Secondary | ICD-10-CM | POA: Diagnosis not present

## 2020-01-14 DIAGNOSIS — Z20822 Contact with and (suspected) exposure to covid-19: Secondary | ICD-10-CM | POA: Diagnosis not present

## 2020-01-14 DIAGNOSIS — Z79899 Other long term (current) drug therapy: Secondary | ICD-10-CM | POA: Insufficient documentation

## 2020-01-14 DIAGNOSIS — J45909 Unspecified asthma, uncomplicated: Secondary | ICD-10-CM | POA: Diagnosis not present

## 2020-01-14 DIAGNOSIS — J390 Retropharyngeal and parapharyngeal abscess: Secondary | ICD-10-CM | POA: Insufficient documentation

## 2020-01-14 DIAGNOSIS — R000 Tachycardia, unspecified: Secondary | ICD-10-CM | POA: Insufficient documentation

## 2020-01-14 DIAGNOSIS — I10 Essential (primary) hypertension: Secondary | ICD-10-CM | POA: Diagnosis not present

## 2020-01-14 DIAGNOSIS — J392 Other diseases of pharynx: Secondary | ICD-10-CM | POA: Diagnosis not present

## 2020-01-14 LAB — CBC WITH DIFFERENTIAL/PLATELET
Abs Immature Granulocytes: 0.05 10*3/uL (ref 0.00–0.07)
Basophils Absolute: 0 10*3/uL (ref 0.0–0.1)
Basophils Relative: 0 %
Eosinophils Absolute: 0 10*3/uL (ref 0.0–0.5)
Eosinophils Relative: 0 %
HCT: 41.1 % (ref 36.0–46.0)
Hemoglobin: 13.9 g/dL (ref 12.0–15.0)
Immature Granulocytes: 0 %
Lymphocytes Relative: 14 %
Lymphs Abs: 2 10*3/uL (ref 0.7–4.0)
MCH: 29.9 pg (ref 26.0–34.0)
MCHC: 33.8 g/dL (ref 30.0–36.0)
MCV: 88.4 fL (ref 80.0–100.0)
Monocytes Absolute: 1.1 10*3/uL — ABNORMAL HIGH (ref 0.1–1.0)
Monocytes Relative: 8 %
Neutro Abs: 11 10*3/uL — ABNORMAL HIGH (ref 1.7–7.7)
Neutrophils Relative %: 78 %
Platelets: 407 10*3/uL — ABNORMAL HIGH (ref 150–400)
RBC: 4.65 MIL/uL (ref 3.87–5.11)
RDW: 14.6 % (ref 11.5–15.5)
WBC: 14.2 10*3/uL — ABNORMAL HIGH (ref 4.0–10.5)
nRBC: 0 % (ref 0.0–0.2)

## 2020-01-14 LAB — RESPIRATORY PANEL BY RT PCR (FLU A&B, COVID)
Influenza A by PCR: NEGATIVE
Influenza B by PCR: NEGATIVE
SARS Coronavirus 2 by RT PCR: NEGATIVE

## 2020-01-14 LAB — COMPREHENSIVE METABOLIC PANEL
ALT: 24 U/L (ref 0–44)
AST: 16 U/L (ref 15–41)
Albumin: 4 g/dL (ref 3.5–5.0)
Alkaline Phosphatase: 90 U/L (ref 38–126)
Anion gap: 12 (ref 5–15)
BUN: 8 mg/dL (ref 6–20)
CO2: 26 mmol/L (ref 22–32)
Calcium: 9.1 mg/dL (ref 8.9–10.3)
Chloride: 98 mmol/L (ref 98–111)
Creatinine, Ser: 0.78 mg/dL (ref 0.44–1.00)
GFR, Estimated: >60 mL/min (ref >60)
Glucose, Bld: 120 mg/dL — ABNORMAL HIGH (ref 70–99)
Potassium: 3.3 mmol/L — ABNORMAL LOW (ref 3.5–5.1)
Sodium: 136 mmol/L (ref 135–145)
Total Bilirubin: 0.9 mg/dL (ref 0.3–1.2)
Total Protein: 8.9 g/dL — ABNORMAL HIGH (ref 6.5–8.1)

## 2020-01-14 LAB — LACTIC ACID, PLASMA
Lactic Acid, Venous: 1.6 mmol/L (ref 0.5–1.9)
Lactic Acid, Venous: 0.6 mmol/L (ref 0.5–1.9)

## 2020-01-14 LAB — GROUP A STREP BY PCR: Group A Strep by PCR: NOT DETECTED

## 2020-01-14 MED ORDER — HYDROMORPHONE HCL 1 MG/ML IJ SOLN
0.5000 mg | Freq: Once | INTRAMUSCULAR | Status: AC
  Administered 2020-01-14: 0.5 mg via INTRAVENOUS
  Filled 2020-01-14: qty 1

## 2020-01-14 MED ORDER — CEPHALEXIN 500 MG PO CAPS: 500.0000 mg | ORAL_CAPSULE | Freq: Two times a day (BID) | ORAL | 0 refills | Status: AC

## 2020-01-14 MED ORDER — IOHEXOL 300 MG/ML  SOLN
75.0000 mL | Freq: Once | INTRAMUSCULAR | Status: AC | PRN
  Administered 2020-01-14: 75 mL via INTRAVENOUS

## 2020-01-14 MED ORDER — SODIUM CHLORIDE 0.9 % IV SOLN
2.0000 g | Freq: Once | INTRAVENOUS | Status: AC
  Administered 2020-01-14: 2 g via INTRAVENOUS
  Filled 2020-01-14: qty 20

## 2020-01-14 MED ORDER — METRONIDAZOLE IN NACL 5-0.79 MG/ML-% IV SOLN
500.0000 mg | Freq: Once | INTRAVENOUS | Status: AC
  Administered 2020-01-14: 500 mg via INTRAVENOUS
  Filled 2020-01-14: qty 100

## 2020-01-14 MED ORDER — SODIUM CHLORIDE (PF) 0.9 % IJ SOLN
INTRAMUSCULAR | Status: AC
  Filled 2020-01-14: qty 50

## 2020-01-14 MED ORDER — DEXAMETHASONE SODIUM PHOSPHATE 10 MG/ML IJ SOLN
8.0000 mg | Freq: Once | INTRAMUSCULAR | Status: AC
  Administered 2020-01-14: 8 mg via INTRAVENOUS
  Filled 2020-01-14: qty 1

## 2020-01-14 MED ORDER — METRONIDAZOLE 500 MG PO TABS: 500.0000 mg | ORAL_TABLET | Freq: Three times a day (TID) | ORAL | 0 refills | Status: AC

## 2020-01-14 NOTE — Discharge Instructions (Addendum)
You are seen today for peritonsillar and peripharyngeal abscess.  Please take the antibiotics as directed and follow-up with ENT in the next couple of days, their information is provided.  Please come back to the emergency department if you have any new worsening concerning symptoms such as drooling, trouble breathing, trouble swallowing or you feel like your symptoms are getting better.  Please use the attached instructions.  You can take Tylenol as prescribed on the bottle for pain.  Please stay hydrated and get plenty of rest.  Your potassium was slightly low, please eat bananas and spinach things with high potassium and have this rechecked in the next week.  Please speak to your pharmacist about any new medications prescribed today including side effects and interactions.

## 2020-01-14 NOTE — Consult Note (Signed)
Reason for Consult: throat pain Referring Physician: ER MD  Sue [name] is an 44 y.o. female.  HPI: 7-day history of worsening left-sided throat pain now radiating to the left ear.  She is having trouble tolerating her secretions and swallowing food.  Associated symptoms include upper respiratory infection symptoms.  She has multiple drug allergies including penicillins and clindamycin.  She is never had a peritonsillar abscess before.  She denies any bleeding disorders or easy bruising.  Past Medical History:  Diagnosis Date  . Asthma    mild by PFT's 03/16/1998  . Cervical lymphadenopathy    biopsy negativeMarvetta Gibbons)  . Chronic hypertension   . Complete spontaneous abortion    x2  . GERD (gastroesophageal reflux disease)   . History of abnormal Pap smear    s/p cryo( Dr. Wiliam Ke)  . HSV (herpes simplex virus) anogenital infection   . Hypertension   . Hypothyroidism   . Kidney stone   . Normal vaginal delivery    x3  . Rheumatoid arthritis (HCC)   . Thyroid disease affecting pregnancy   . 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   . Vaginal Pap smear, abnormal   . Vitamin B12 deficiency     Past Surgical History:  Procedure Laterality Date  . biopsy of lymph node in R neck  2007.  Marland Kitchen LAPAROSCOPIC APPENDECTOMY N/A 05/08/2019   Procedure: APPENDECTOMY LAPAROSCOPIC WITH DIAGNOSTIC LAPAROSCOPY;  Surgeon: [name], MD;  Location: [facility];  Service: General;  Laterality: N/A;  . LEEP      Family History  Problem Relation Age of Onset  . Ulcerative colitis Mother   . Hypertension Mother   . Diabetes Father   . Heart attack Father   . Hypertension Father   . Diabetes Sister   . Thyroid disease Sister   . Heart attack Maternal Grandmother   . Stroke Maternal Grandmother   . Breast cancer Neg Hx     Social History:   reports that she has never smoked. She has never used smokeless tobacco. She reports current alcohol use. She reports that she does not use drugs.  Allergies:  Allergies  Allergen Reactions  . Ciprofloxacin Anaphylaxis and Swelling  . Clindamycin/Lincomycin Anaphylaxis and Swelling  . Sulfamethoxazole-Trimethoprim Swelling    Swelling is of the lips  . Amoxicillin Rash    Has patient had a PCN reaction causing immediate rash, facial/tongue/throat swelling, SOB or lightheadedness with hypotension: no Has patient had a PCN reaction causing severe rash involving mucus membranes or skin necrosis: pt had rash -- non severe  Has patient had a PCN reaction that required hospitalization: no Has patient had a PCN reaction occurring within the last 10 years: no If all of the above answers are "NO", then may proceed with Cephalosporin use.     Medications: I have reviewed the patient's current medications.  Results for orders placed or performed during the hospital encounter of 01/14/20 (from the past 48 hour(s))  CBC with Differential     Status: Abnormal   Collection Time: 01/14/20  7:44 AM  Result Value Ref Range   WBC 14.2 (H) 4.0 - 10.5 K/uL   RBC 4.65 3.87 - 5.11 MIL/uL   Hemoglobin 13.9 12.0 - 15.0 g/dL   HCT 40.3 36 - 46 %   MCV 88.4 80.0 - 100.0 fL   MCH 29.9 26.0 -  34.0 pg   MCHC 33.8 30.0 - 36.0 g/dL   RDW 70.3 50.0 - 93.8 %   Platelets 407 (H) 150 - 400 K/uL   nRBC 0.0 0.0 - 0.2 %   Neutrophils Relative % 78 %   Neutro Abs 11.0 (H) 1.7 - 7.7 K/uL   Lymphocytes Relative 14 %   Lymphs Abs 2.0 0.7 - 4.0 K/uL   Monocytes Relative 8 %   Monocytes Absolute 1.1 (H) 0.1 - 1.0 K/uL   Eosinophils Relative 0 %   Eosinophils Absolute 0.0 0.0 - 0.5 K/uL   Basophils Relative 0 %   Basophils Absolute 0.0 0.0 - 0.1 K/uL   Immature Granulocytes 0 %   Abs Immature Granulocytes 0.05 0.00 - 0.07 K/uL    Comment: Performed at [facility], [address],  [place], [place] [address]  Comprehensive metabolic panel     Status: Abnormal   Collection Time: 01/14/20  7:44 AM  Result Value Ref Range   Sodium 136 135 - 145 mmol/L   Potassium 3.3 (L) 3.5 - 5.1 mmol/L   Chloride 98 98 - 111 mmol/L   CO2 26 22 - 32 mmol/L   Glucose, Bld 120 (H) 70 - 99 mg/dL    Comment: Glucose reference range applies only to samples taken after fasting for at least 8 hours.   BUN 8 6 - 20 mg/dL   Creatinine, Ser 3.71 0.44 - 1.00 mg/dL   Calcium 9.1 8.9 - 69.6 mg/dL   Total Protein 8.9 (H) 6.5 - 8.1 g/dL   Albumin 4.0 3.5 - 5.0 g/dL   AST 16 15 - 41 U/L   ALT 24 0 - 44 U/L   Alkaline Phosphatase 90 38 - 126 U/L   Total Bilirubin 0.9 0.3 - 1.2 mg/dL   GFR, Estimated >78 >93 mL/min    Comment: (NOTE) Calculated using the CKD-EPI Creatinine Equation (2021)    Anion gap 12 5 - 15    Comment: Performed at [facility], [address] [place], [place] [address]  Lactic acid, plasma     Status: None   Collection Time: 01/14/20  7:44 AM  Result Value Ref Range   Lactic Acid, Venous 1.6 0.5 - 1.9 mmol/L    Comment: Performed at [facility], [address], [place], [place] [address]  Group A Strep by PCR     Status: None   Collection Time: 01/14/20  7:44 AM   Specimen: Nasopharyngeal Swab; Sterile Swab  Result Value Ref Range   Group A Strep by PCR NOT DETECTED NOT DETECTED    Comment: Performed at [facility], [address] [place], [place] [address]  Respiratory Panel by RT PCR (Flu A&B, Covid) - Nasopharyngeal Swab     Status: None   Collection Time: 01/14/20  7:48 AM   Specimen: Nasopharyngeal Swab  Result Value Ref Range   SARS Coronavirus 2 by RT PCR NEGATIVE NEGATIVE    Comment: (NOTE) SARS-CoV-2 target nucleic acids are NOT DETECTED.  The SARS-CoV-2 RNA is generally detectable in upper respiratoy specimens during the acute phase of infection. The lowest concentration of SARS-CoV-2 viral copies  this assay can detect is 131 copies/mL. A negative result does not preclude SARS-Cov-2 infection and should not be used as the sole basis for treatment or other patient management decisions. A negative result may occur with  improper specimen collection/handling, submission of specimen other than nasopharyngeal swab, presence of viral mutation(s) within  the areas targeted by this assay, and inadequate number of viral copies (<131 copies/mL). A negative result must be combined with clinical observations, patient history, and epidemiological information. The expected result is Negative.  Fact Sheet for Patients:  https://www.moore.com/  Fact Sheet for Healthcare Providers:  https://www.young.biz/  This test is no t yet approved or cleared by the [place] FDA and  has been authorized for detection and/or diagnosis of SARS-CoV-2 by FDA under an Emergency Use Authorization (EUA). This EUA will remain  in effect (meaning this test can be used) for the duration of the COVID-19 declaration under Section 564(b)(1) of the Act, 21 U.S.C. section 360bbb-3(b)(1), unless the authorization is terminated or revoked sooner.     Influenza A by PCR NEGATIVE NEGATIVE   Influenza B by PCR NEGATIVE NEGATIVE    Comment: (NOTE) The Xpert Xpress SARS-CoV-2/FLU/RSV assay is intended as an aid in  the diagnosis of influenza from Nasopharyngeal swab specimens and  should not be used as a sole basis for treatment. Nasal washings and  aspirates are unacceptable for Xpert Xpress SARS-CoV-2/FLU/RSV  testing.  Fact Sheet for Patients: https://www.moore.com/  Fact Sheet for Healthcare Providers: https://www.young.biz/  This test is not yet approved or cleared by the [place] FDA and  has been authorized for detection and/or diagnosis of SARS-CoV-2 by  FDA under an Emergency Use Authorization (EUA). This EUA will remain   in effect (meaning this test can be used) for the duration of the  Covid-19 declaration under Section 564(b)(1) of the Act, 21  U.S.C. section 360bbb-3(b)(1), unless the authorization is  terminated or revoked. Performed at [facility], [address] [place], [place] [address]   Lactic acid, plasma     Status: None   Collection Time: 01/14/20  9:44 AM  Result Value Ref Range   Lactic Acid, Venous 0.6 0.5 - 1.9 mmol/L    Comment: Performed at [facility], [address], [place], [place] [address]    CT Soft Tissue Neck W Contrast  Result Date: 01/14/2020 CLINICAL DATA:  Epiglottitis or tonsillitis suspected. Additional history provided: Patient presents with sore throat for 1 week, difficulty speaking and swallowing. Pain on left side greater than right. EXAM: CT NECK WITH CONTRAST TECHNIQUE: Multidetector CT imaging of the neck was performed using the standard protocol following the bolus administration of intravenous contrast. CONTRAST:  74mL OMNIPAQUE IOHEXOL 300 MG/ML  SOLN COMPARISON:  No pertinent prior exams are available for comparison. FINDINGS: Pharynx and larynx: Mild soft tissue swelling within the left nasopharynx. Prominence of the palatine tonsils bilaterally (greater on the left). In the region of the left palatine tonsil, there is a 1.7 cm hypodense focus compatible with peritonsillar abscess (series 7, image 51) (series 3, image 32). Left parapharyngeal edema extends inferiorly to the level of the hyoid bone with resultant mild airway effacement (for instance as seen on series 3, image 50). Salivary glands: No inflammation, mass, or stone. Thyroid: Subcentimeter left thyroid lobe nodule not meeting consensus criteria for ultrasound follow-up. Lymph nodes: Mildly enlarged left level I and II lymph nodes, likely reactive. Vascular: The major vascular structures of the neck are patent. Soft and calcified plaque within the right greater than  left ICA bulbs. Limited intracranial: No acute intracranial abnormality identified. Visualized orbits: Incompletely imaged. Visualized portions demonstrate no mass or acute abnormality. Mastoids and visualized paranasal sinuses: No significant paranasal sinus disease or mastoid effusion at the imaged levels. Skeleton: No acute bony abnormality or aggressive  osseous lesion. Upper chest: No consolidation within the imaged lung apices. IMPRESSION: Findings consistent with pharyngitis and tonsillitis. 1.7 cm left peritonsillar abscess. Left parapharyngeal edema extends inferiorly to the level of the hyoid bone with resultant mild airway effacement. Mildly enlarged left level I and II lymph nodes, likely reactive. Electronically Signed   By: Jackey Loge DO   On: 01/14/2020 08:56    Review of Systems  See HPI (8 systems reviewed)  Blood pressure (!) 143/98, pulse (!) 102, temperature 98.6 F (37 C), temperature source Oral, resp. rate 18, height 5\' 4"  (1.626 m), weight 86.2 kg, SpO2 100 %. Physical Exam Alert and oriented x3 in no acute distress/communicates in complete sentences without shortness of breath Gaze conjugate/pupils equal round and reactive to light Face atraumatic without bony step-offs or sinus tenderness Pinna normal without mastoid tenderness Nasal mucosa pink without polyps or masses No cervical adenopathy or salivary gland masses/trachea midline Chest symmetric expansions bilaterally without use of accessory muscles Cranial nerves II through XII intact/no focal motor or sensory deficits noted No rashes or suspicious lesions in the head neck Patient has swelling involving the left soft palate with uvular deviation to the right consistent with a peritonsillar abscess.  CT scan -confirms left peritonsillar abscess with infection and edema extending inferiorly along the parapharyngeal space  Assessment/Plan:  Left-sided neck abscess  Plan to drain abscess. Consent  obtained.  Procedure note: After informed consent was obtained the patient was injected with 1% lidocaine with 1 100,000 epinephrine.  Incision was made in the soft palate superior to the left tonsil pole.  Dissection into the peritonsillar space allowed for release of large amounts of pus.  Patient tolerated this well.  Patient was asked to irrigate with half-strength hydrogen peroxide for 20 minutes followed by water.  Patient should be treated with the appropriate antibiotic.  Given her allergies, I will defer to my medical colleagues and/or infectious disease about what antibiotic to pick.  Rejeana Brock 01/14/2020, 10:53 AM

## 2020-01-14 NOTE — ED Provider Notes (Signed)
[facility]-EMERGENCY DEPT Provider Note   CSN: [id number] Arrival date & time: 01/14/20  0301     History Chief Complaint  Patient presents with  . Sore Throat    Sue [name] is a 44 y.o. female with pertinent past medical history of asthma, hypertension, hypothyroid that presents the emergency department today for sore throat.  Patient states that she has had a sore throat for a week, had it evaluated on Tuesday by PCP who diagnosed her with ear infection and gave her eardrops, states that sore throat has been getting worse, got reevaluated on Friday and they tested her for strep and mono which were negative.  Patient states that it has been progressively worsening, has been difficult to swallow and she has been spitting up saliva at home.  States that she does not have any other symptoms besides sore throat and difficulty swallowing.  Denies any difficulty breathing.  States that she cannot lay flat because she finds it difficult to swallow.  Denies any cough, fevers, nausea, vomiting, abdominal pain, congestion.  Has been vaccinated against Covid.  Was not tested for Covid.  Has been taking Tylenol without much relief.  States that she is able to swallow water and Tylenol, however has been difficult to swallow Tylenol.  Denies any sick contacts, states that no one else is sick in her house.  States that ear pain is gotten better.  Denies any drooling or trismus. HPI     Past Medical History:  Diagnosis Date  . Asthma    mild by PFT's 03/16/1998  . Cervical lymphadenopathy    biopsy negativeMarvetta Gibbons)  . Chronic hypertension   . Complete spontaneous abortion    x2  . GERD (gastroesophageal reflux disease)   . History of abnormal Pap smear    s/p cryo( Dr. Wiliam Ke)  . HSV (herpes simplex virus) anogenital infection   . Hypertension   . Hypothyroidism   . Kidney stone   . Normal vaginal delivery    x3  . Rheumatoid arthritis (HCC)   . Thyroid disease affecting  pregnancy   . 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   . Vaginal Pap smear, abnormal   . Vitamin B12 deficiency     Patient Active Problem List   Diagnosis Date Noted  . Tonsillar abscess   . Throat pain in adult   . Pharyngitis 01/12/2020  . Otitis externa 01/10/2020  . Vitamin B12 deficiency   . Rash of both hands 02/14/2019  . Costochondritis 02/14/2019  . Cough 01/07/2018  . Hyperthyroidism 05/14/2017  . 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 GERD (gastroesophageal reflux disease) 12/03/2016  . History of ELISA positive for HSV 10/06/2016  . Alopecia 03/27/2016  . Fatty liver 01/05/2016  . Hiatal hernia 01/05/2016  . Overweight (BMI 25.0-29.9) 01/05/2016  . Healthcare maintenance 09/12/2014  . Seasonal allergies 11/02/2013  . 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 Carpal tunnel syndrome 04/10/2011  . HERNIATED DISC 12/31/2009  . Essential hypertension 06/15/2007    Past Surgical History:  Procedure Laterality Date  . biopsy of lymph node in R neck  2007.  Marland Kitchen LAPAROSCOPIC APPENDECTOMY N/A 05/08/2019   Procedure: APPENDECTOMY LAPAROSCOPIC WITH DIAGNOSTIC LAPAROSCOPY;  Surgeon: [name], MD;  Location: [facility] ORS;  Service: General;  Laterality: N/A;  . LEEP       OB History  Gravida  7   Para  4   Term  3   Preterm  1   AB  3   Living  4     SAB  3   TAB      Ectopic      Multiple  0   Live Births  4           Family History  Problem Relation Age of Onset  . Ulcerative colitis Mother   . Hypertension Mother   . Diabetes Father   . Heart attack Father   . Hypertension Father   . Diabetes Sister   . Thyroid disease Sister   . Heart attack Maternal Grandmother   . Stroke Maternal Grandmother   . Breast cancer Neg Hx     Social History   Tobacco Use  . Smoking status: Never  Smoker  . Smokeless tobacco: Never Used  Vaping Use  . Vaping Use: Never used  Substance Use Topics  . Alcohol use: Yes    Alcohol/week: 0.0 standard drinks    Comment: Sometimes.  . Drug use: No    Home Medications Prior to Admission medications   Medication Sig Start Date End Date Taking? Authorizing Provider  acetaminophen (TYLENOL) 500 MG tablet Take 1,000 mg by mouth every 6 (six) hours as needed for mild pain, moderate pain or headache.   Yes [provider]  Adalimumab (HUMIRA PEN) 40 MG/0.4ML PNKT Inject 40 mg into the skin once a week. Every Friday    Yes [provider]  amLODipine (NORVASC) 10 MG tablet TAKE 1 TABLET(10 MG) BY MOUTH DAILY Patient taking differently: Take 10 mg by mouth daily.  10/09/19  Yes Claudean Severance, MD  Chlorphen-Pseudoephed-APAP (CORICIDIN D PO) Take 1 tablet by mouth as needed (congestion).   Yes [provider]  cyanocobalamin (,VITAMIN B-12,) 1000 MCG/ML injection Inject 1 mL (1,000 mcg total) into the muscle every 30 (thirty) days. 09/29/19  Yes Dellia Cloud, MD  famotidine (PEPCID) 20 MG tablet Take 20 mg by mouth daily. 12/18/19  Yes [provider]  methimazole (TAPAZOLE) 5 MG tablet TAKE 1 TABLET(5 MG) BY MOUTH DAILY Patient taking differently: Take 5 mg by mouth daily.  01/10/20  Yes Romero Belling, MD  MULTIPLE VITAMIN PO Take 1 tablet by mouth daily.   Yes [provider]  NORTREL 1/35, 28, tablet TAKE 1 TABLET BY MOUTH DAILY Patient taking differently: Take 1 tablet by mouth daily.  01/10/19  Yes Brock Bad, MD  polyvinyl alcohol (LIQUIFILM TEARS) 1.4 % ophthalmic solution Place 1 drop into both eyes as needed for dry eyes.   Yes [provider]  valACYclovir (VALTREX) 500 MG tablet Take 1 tablet bid x 3 days prn outbreaks. Patient taking differently: Take 500 mg by mouth See admin instructions. 500mg  twice daily for 3 days as needed for breakouts 12/03/19  Yes 12/05/19, MD    cephALEXin (KEFLEX) 500 MG capsule Take 1 capsule (500 mg total) by mouth 2 (two) times daily for 7 days. 01/14/20 01/21/20  01/23/20, PA-C  doxycycline (VIBRAMYCIN) 100 MG capsule Take 1 capsule (100 mg total) by mouth 2 (two) times daily. Patient not taking: Reported on 01/14/2020 11/03/19   11/05/19, MD  metroNIDAZOLE (FLAGYL) 500 MG tablet Take 1 tablet (500 mg total) by mouth 3 (three) times daily for 7 days. 01/14/20 01/21/20  01/23/20, PA-C  neomycin-polymyxin-hydrocortisone (CORTISPORIN) 3.5-10000-1 OTIC suspension Place 4 drops into the left  ear 4 (four) times daily. Patient not taking: Reported on 01/14/2020 01/09/20   Claudean Severance, MD    Allergies    Ciprofloxacin, Clindamycin/lincomycin, Sulfamethoxazole-trimethoprim, and Amoxicillin  Review of Systems   Review of Systems  Constitutional: Negative for chills, diaphoresis, fatigue and fever.  HENT: Positive for ear pain, sore throat and trouble swallowing. Negative for congestion, drooling, ear discharge, facial swelling, postnasal drip, rhinorrhea, sinus pressure, sinus pain, sneezing, tinnitus and voice change.   Eyes: Negative for pain and visual disturbance.  Respiratory: Negative for cough, shortness of breath and wheezing.   Cardiovascular: Negative for chest pain, palpitations and leg swelling.  Gastrointestinal: Negative for abdominal distention, abdominal pain, diarrhea, nausea and vomiting.  Genitourinary: Negative for difficulty urinating.  Musculoskeletal: Negative for back pain, neck pain and neck stiffness.  Skin: Negative for pallor.  Neurological: Negative for dizziness, speech difficulty, weakness and headaches.  Psychiatric/Behavioral: Negative for confusion.    Physical Exam Updated Vital Signs BP (!) 144/94   Pulse (!) 105   Temp 98.7 F (37.1 C) (Oral)   Resp 16   Ht 5\' 4"  (1.626 m)   Wt 86.2 kg   SpO2 99%   BMI 32.61 kg/m   Physical Exam Constitutional:      General: She  is in acute distress.     Appearance: Normal appearance. She is not ill-appearing, toxic-appearing or diaphoretic.  HENT:     Left Ear: Tympanic membrane, ear canal and external ear normal. There is no impacted cerumen.     Mouth/Throat:     Mouth: Mucous membranes are moist.     Tongue: Tongue does not deviate from midline.     Pharynx: Oropharynx is clear.     Tonsils: Tonsillar abscess present. No tonsillar exudate.     Comments: Erythema and swelling on the left side, suggestive of tonsillar abscess.  No exudate noted.  Uvula pushed towards the right side.  Patient spitting up saliva, however no drooling.  Able to speak to me in full sentences.  No trismus.  No swelling under tongue. Eyes:     General: No scleral icterus.    Extraocular Movements: Extraocular movements intact.     Pupils: Pupils are equal, round, and reactive to light.  Cardiovascular:     Rate and Rhythm: Regular rhythm. Tachycardia present.     Pulses: Normal pulses.     Heart sounds: Normal heart sounds.  Pulmonary:     Effort: Pulmonary effort is normal. No respiratory distress.     Breath sounds: Normal breath sounds. No stridor. No wheezing, rhonchi or rales.     Comments: No tripoding, no accessory muscle use. Chest:     Chest wall: No tenderness.  Abdominal:     General: Abdomen is flat. There is no distension.     Palpations: Abdomen is soft.     Tenderness: There is no abdominal tenderness. There is no guarding or rebound.  Musculoskeletal:        General: No swelling or tenderness. Normal range of motion.     Cervical back: Normal range of motion and neck supple. No rigidity.     Right lower leg: No edema.     Left lower leg: No edema.  Skin:    General: Skin is warm and dry.     Capillary Refill: Capillary refill takes less than 2 seconds.     Coloration: Skin is not pale.  Neurological:     General: No focal deficit present.  Mental Status: She is alert and oriented to person, place, and  time.  Psychiatric:        Mood and Affect: Mood normal.        Behavior: Behavior normal.     ED Results / Procedures / Treatments   Labs (all labs ordered are listed, but only abnormal results are displayed) Labs Reviewed  CBC WITH DIFFERENTIAL/PLATELET - Abnormal; Notable for the following components:      Result Value   WBC 14.2 (*)    Platelets 407 (*)    Neutro Abs 11.0 (*)    Monocytes Absolute 1.1 (*)    All other components within normal limits  COMPREHENSIVE METABOLIC PANEL - Abnormal; Notable for the following components:   Potassium 3.3 (*)    Glucose, Bld 120 (*)    Total Protein 8.9 (*)    All other components within normal limits  GROUP A STREP BY PCR  RESPIRATORY PANEL BY RT PCR (FLU A&B, COVID)  LACTIC ACID, PLASMA  LACTIC ACID, PLASMA    EKG None  Radiology CT Soft Tissue Neck W Contrast  Result Date: 01/14/2020 CLINICAL DATA:  Epiglottitis or tonsillitis suspected. Additional history provided: Patient presents with sore throat for 1 week, difficulty speaking and swallowing. Pain on left side greater than right. EXAM: CT NECK WITH CONTRAST TECHNIQUE: Multidetector CT imaging of the neck was performed using the standard protocol following the bolus administration of intravenous contrast. CONTRAST:  55mL OMNIPAQUE IOHEXOL 300 MG/ML  SOLN COMPARISON:  No pertinent prior exams are available for comparison. FINDINGS: Pharynx and larynx: Mild soft tissue swelling within the left nasopharynx. Prominence of the palatine tonsils bilaterally (greater on the left). In the region of the left palatine tonsil, there is a 1.7 cm hypodense focus compatible with peritonsillar abscess (series 7, image 51) (series 3, image 32). Left parapharyngeal edema extends inferiorly to the level of the hyoid bone with resultant mild airway effacement (for instance as seen on series 3, image 50). Salivary glands: No inflammation, mass, or stone. Thyroid: Subcentimeter left thyroid lobe nodule  not meeting consensus criteria for ultrasound follow-up. Lymph nodes: Mildly enlarged left level I and II lymph nodes, likely reactive. Vascular: The major vascular structures of the neck are patent. Soft and calcified plaque within the right greater than left ICA bulbs. Limited intracranial: No acute intracranial abnormality identified. Visualized orbits: Incompletely imaged. Visualized portions demonstrate no mass or acute abnormality. Mastoids and visualized paranasal sinuses: No significant paranasal sinus disease or mastoid effusion at the imaged levels. Skeleton: No acute bony abnormality or aggressive osseous lesion. Upper chest: No consolidation within the imaged lung apices. IMPRESSION: Findings consistent with pharyngitis and tonsillitis. 1.7 cm left peritonsillar abscess. Left parapharyngeal edema extends inferiorly to the level of the hyoid bone with resultant mild airway effacement. Mildly enlarged left level I and II lymph nodes, likely reactive. Electronically Signed   By: Jackey Loge DO   On: 01/14/2020 08:56    Procedures Procedures (including critical care time)  Medications Ordered in ED Medications  sodium chloride (PF) 0.9 % injection (has no administration in time range)  HYDROmorphone (DILAUDID) injection 0.5 mg (0.5 mg Intravenous Given 01/14/20 0759)  dexamethasone (DECADRON) injection 8 mg (8 mg Intravenous Given 01/14/20 0757)  iohexol (OMNIPAQUE) 300 MG/ML solution 75 mL (75 mLs Intravenous Contrast Given 01/14/20 0819)  cefTRIAXone (ROCEPHIN) 2 g in sodium chloride 0.9 % 100 mL IVPB (0 g Intravenous Stopped 01/14/20 0930)  metroNIDAZOLE (FLAGYL) IVPB 500 mg (0 mg  Intravenous Stopped 01/14/20 0948)  HYDROmorphone (DILAUDID) injection 0.5 mg (0.5 mg Intravenous Given 01/14/20 1212)    ED Course  I have reviewed the triage vital signs and the nursing notes.  Pertinent labs & imaging results that were available during my care of the patient were reviewed by me and considered  in my medical decision making (see chart for details).    MDM Rules/Calculators/A&P                          Sue [name] is a 44 y.o. female with pertinent past medical history of asthma, hypertension, hypothyroid that presents the emergency department today for sore throat.  Differential to include peritonsillar abscess, deep space infection, Ludwigs.  Patient appears in acute distress, spitting of saliva and complaining of dysphagia.  Exam suggestive of abscess, however will obtain CT scan and labs at this time.  Work-up with leukocytosis of 14.2, strep negative, Covid negative, lactic acid normal.  CMP unremarkable, potassium slightly low at 3.3. CT scan shows 1.7 cm left peritonsillar abscess.  Initial interventions include pain control and Decadron.  850 spoke to [place], pharmacy who recommended Rocephin and Flagyl at this time since patient has severe reactions to amoxicillin, penicillin and clindamycin.  920 spoke to ENT, Dr. Elijah Birk who will come drain the abscess.  Upon reevaluation patient was able to tolerate procedure, abscess currently draining.  Patient states that she feels much better.  Pain controlled with Dilaudid, patient to be discharged at this time.  Did speak to pharmacy again, Lequita Halt who recommended Keflex and Flagyl p.o. for 7 days.  Patient will follow up with ENT, patient agreeable for this.  Doubt need for further emergent work up at this time. I explained the diagnosis and have given explicit precautions to return to the ER including for any other new or worsening symptoms. The patient understands and accepts the medical plan as it's been dictated and I have answered their questions. Discharge instructions concerning home care and prescriptions have been given. The patient is STABLE and is discharged to home in good condition.   Final Clinical Impression(s) / ED Diagnoses Final diagnoses:  Peripharyngeal abscess    Rx / DC Orders ED Discharge Orders          Ordered    metroNIDAZOLE (FLAGYL) 500 MG tablet  3 times daily        01/14/20 1305    cephALEXin (KEFLEX) 500 MG capsule  2 times daily        01/14/20 1305           Farrel Gordon, PA-C 01/14/20 1311    Pollyann Savoy, MD 01/14/20 1434

## 2020-01-14 NOTE — ED Notes (Signed)
Pt in bed resting watching TV, respirations even and unlabored.

## 2020-01-14 NOTE — ED Triage Notes (Signed)
Patient presents with sore throat x 1 week. Patient has been tested for COVID and strep, both negative. Patient with difficulty speaking and swallowing.

## 2020-01-15 ENCOUNTER — Telehealth: Payer: Self-pay

## 2020-01-15 NOTE — Telephone Encounter (Signed)
Transition Care Management Unsuccessful Follow-up Telephone Call  Date of discharge and from where:  01/14/2020 [facility]  Attempts:  1st Attempt  Reason for unsuccessful TCM follow-up call:  Left voice message

## 2020-01-16 LAB — CBC WITH DIFFERENTIAL/PLATELET
Basophils Absolute: 0.1 10*3/uL (ref 0.0–0.2)
Basos: 1 %
EOS (ABSOLUTE): 0.2 10*3/uL (ref 0.0–0.4)
Eos: 2 %
Hematocrit: 40.6 % (ref 34.0–46.6)
Hemoglobin: 13.9 g/dL (ref 11.1–15.9)
Immature Grans (Abs): 0 10*3/uL (ref 0.0–0.1)
Immature Granulocytes: 0 %
Lymphocytes Absolute: 3.1 10*3/uL (ref 0.7–3.1)
Lymphs: 32 %
MCH: 29.5 pg (ref 26.6–33.0)
MCHC: 34.2 g/dL (ref 31.5–35.7)
MCV: 86 fL (ref 79–97)
Monocytes Absolute: 0.8 10*3/uL (ref 0.1–0.9)
Monocytes: 9 %
Neutrophils Absolute: 5.6 10*3/uL (ref 1.4–7.0)
Neutrophils: 56 %
Platelets: 394 10*3/uL (ref 150–450)
RBC: 4.71 x10E6/uL (ref 3.77–5.28)
RDW: 13 % (ref 11.7–15.4)
WBC: 9.8 10*3/uL (ref 3.4–10.8)

## 2020-01-16 LAB — BMP8+ANION GAP
Anion Gap: 14 mmol/L (ref 10.0–18.0)
BUN/Creatinine Ratio: 9 (ref 9–23)
BUN: 7 mg/dL (ref 6–24)
CO2: 25 mmol/L (ref 20–29)
Calcium: 9.3 mg/dL (ref 8.7–10.2)
Chloride: 99 mmol/L (ref 96–106)
Creatinine, Ser: 0.76 mg/dL (ref 0.57–1.00)
GFR calc Af Amer: 111 mL/min/{1.73_m2} (ref >59)
GFR calc non Af Amer: 96 mL/min/{1.73_m2} (ref >59)
Glucose: 110 mg/dL — ABNORMAL HIGH (ref 65–99)
Potassium: 3.9 mmol/L (ref 3.5–5.2)
Sodium: 138 mmol/L (ref 134–144)

## 2020-01-16 LAB — MONO QUAL W/RFLX QN: Mono Qual W/Rflx Qn: POSITIVE — AB

## 2020-01-16 LAB — MONONUCLEOSIS TEST, QUANT: MONO TITER: 1:2 {titer} — ABNORMAL HIGH

## 2020-01-16 NOTE — Telephone Encounter (Signed)
Transition Care Management Unsuccessful Follow-up Telephone Call  Date of discharge and from where:  01/14/2020 [facility]  Attempts:  2nd Attempt  Reason for unsuccessful TCM follow-up call:  Left voice message

## 2020-01-16 NOTE — Progress Notes (Signed)
Internal Medicine Clinic Attending ° °Case discussed with Dr. Krienke  At the time of the visit.  We reviewed the resident’s history and exam and pertinent patient test results.  I agree with the assessment, diagnosis, and plan of care documented in the resident’s note.  °

## 2020-01-16 NOTE — Progress Notes (Signed)
Contacted patient regarding positive mono test. Patient reports that she went to [facility] ED on Saturday morning, had CT scan that showed 1.7 left peritonsillar abscess. ENT evalauted and drained abscess, discharged with Keflex and flagyl x 7 days. Patient reports feeling better after that.

## 2020-01-17 NOTE — Telephone Encounter (Signed)
Transition Care Management Unsuccessful Follow-up Telephone Call  Date of discharge and from where:  01/14/2020 [facility] ED  Attempts:  3rd Attempt  Reason for unsuccessful TCM follow-up call:  Left voice message

## 2020-01-18 ENCOUNTER — Encounter: Payer: Medicaid Other | Admitting: Internal Medicine

## 2020-01-26 ENCOUNTER — Ambulatory Visit
Admission: RE | Admit: 2020-01-26 | Discharge: 2020-01-26 | Disposition: A | Discharge status: Home / Self Care | Payer: Medicaid Other | Source: Ambulatory Visit | Attending: Obstetrics | Admitting: Obstetrics

## 2020-01-26 ENCOUNTER — Other Ambulatory Visit: Payer: Self-pay

## 2020-01-26 DIAGNOSIS — R928 Other abnormal and inconclusive findings on diagnostic imaging of breast: Secondary | ICD-10-CM | POA: Diagnosis not present

## 2020-01-26 DIAGNOSIS — N6489 Other specified disorders of breast: Secondary | ICD-10-CM | POA: Diagnosis not present

## 2020-01-29 DIAGNOSIS — Z79899 Other long term (current) drug therapy: Secondary | ICD-10-CM | POA: Diagnosis not present

## 2020-01-29 DIAGNOSIS — Z880 Allergy status to penicillin: Secondary | ICD-10-CM | POA: Diagnosis not present

## 2020-01-29 DIAGNOSIS — K529 Noninfective gastroenteritis and colitis, unspecified: Secondary | ICD-10-CM | POA: Diagnosis not present

## 2020-01-29 DIAGNOSIS — R21 Rash and other nonspecific skin eruption: Secondary | ICD-10-CM | POA: Diagnosis not present

## 2020-01-29 DIAGNOSIS — M129 Arthropathy, unspecified: Secondary | ICD-10-CM | POA: Diagnosis not present

## 2020-01-29 DIAGNOSIS — Z882 Allergy status to sulfonamides status: Secondary | ICD-10-CM | POA: Diagnosis not present

## 2020-01-29 DIAGNOSIS — Z881 Allergy status to other antibiotic agents status: Secondary | ICD-10-CM | POA: Diagnosis not present

## 2020-01-29 DIAGNOSIS — H209 Unspecified iridocyclitis: Secondary | ICD-10-CM | POA: Diagnosis not present

## 2020-02-02 DIAGNOSIS — J36 Peritonsillar abscess: Secondary | ICD-10-CM | POA: Diagnosis not present

## 2020-02-08 ENCOUNTER — Other Ambulatory Visit: Payer: Self-pay | Admitting: Internal Medicine

## 2020-02-08 DIAGNOSIS — E538 Deficiency of other specified B group vitamins: Secondary | ICD-10-CM

## 2020-02-23 DIAGNOSIS — K529 Noninfective gastroenteritis and colitis, unspecified: Secondary | ICD-10-CM | POA: Diagnosis not present

## 2020-02-23 DIAGNOSIS — K6389 Other specified diseases of intestine: Secondary | ICD-10-CM | POA: Diagnosis not present

## 2020-02-23 DIAGNOSIS — L309 Dermatitis, unspecified: Secondary | ICD-10-CM | POA: Diagnosis not present

## 2020-02-23 DIAGNOSIS — R21 Rash and other nonspecific skin eruption: Secondary | ICD-10-CM | POA: Diagnosis not present

## 2020-02-23 DIAGNOSIS — M129 Arthropathy, unspecified: Secondary | ICD-10-CM | POA: Diagnosis not present

## 2020-03-02 ENCOUNTER — Other Ambulatory Visit: Payer: Self-pay

## 2020-03-02 ENCOUNTER — Encounter (HOSPITAL_COMMUNITY): Payer: Self-pay | Admitting: Emergency Medicine

## 2020-03-02 ENCOUNTER — Emergency Department (HOSPITAL_COMMUNITY)
Admission: EM | Admit: 2020-03-02 | Discharge: 2020-03-02 | Disposition: A | Discharge status: Home / Self Care | Payer: Medicaid Other | Attending: Emergency Medicine | Admitting: Emergency Medicine

## 2020-03-02 DIAGNOSIS — I10 Essential (primary) hypertension: Secondary | ICD-10-CM | POA: Diagnosis not present

## 2020-03-02 DIAGNOSIS — J020 Streptococcal pharyngitis: Secondary | ICD-10-CM

## 2020-03-02 DIAGNOSIS — L0293 Carbuncle, unspecified: Secondary | ICD-10-CM

## 2020-03-02 DIAGNOSIS — J45909 Unspecified asthma, uncomplicated: Secondary | ICD-10-CM | POA: Insufficient documentation

## 2020-03-02 DIAGNOSIS — E039 Hypothyroidism, unspecified: Secondary | ICD-10-CM | POA: Insufficient documentation

## 2020-03-02 DIAGNOSIS — A419 Sepsis, unspecified organism: Secondary | ICD-10-CM | POA: Diagnosis not present

## 2020-03-02 DIAGNOSIS — J029 Acute pharyngitis, unspecified: Secondary | ICD-10-CM | POA: Diagnosis present

## 2020-03-02 MED ORDER — LIDOCAINE VISCOUS HCL 2 % MT SOLN
15.0000 mL | Freq: Once | OROMUCOSAL | Status: AC
  Administered 2020-03-02: 15 mL via OROMUCOSAL
  Filled 2020-03-02: qty 15

## 2020-03-02 MED ORDER — PREDNISONE 20 MG PO TABS: ORAL_TABLET | ORAL | 0 refills | Status: DC

## 2020-03-02 MED ORDER — CEPHALEXIN 500 MG PO CAPS: ORAL_CAPSULE | ORAL | 0 refills | Status: DC

## 2020-03-02 MED ORDER — DOXYCYCLINE HYCLATE 100 MG PO CAPS: 100.0000 mg | ORAL_CAPSULE | Freq: Two times a day (BID) | ORAL | 0 refills | Status: DC

## 2020-03-02 MED ORDER — DEXAMETHASONE SODIUM PHOSPHATE 10 MG/ML IJ SOLN
10.0000 mg | Freq: Once | INTRAMUSCULAR | Status: AC
  Administered 2020-03-02: 10 mg via INTRAMUSCULAR
  Filled 2020-03-02: qty 1

## 2020-03-02 NOTE — ED Provider Notes (Addendum)
[facility]-EMERGENCY DEPT Provider Note   CSN: [id number] Arrival date & time: 03/02/20  2751     History Chief Complaint  Patient presents with  . Sore Throat    Hx peritonsillar abscess    [name] is a 44 y.o. female.  The history is provided by the patient and medical records. No language interpreter was used.  Sore Throat     44 year old female recently diagnosed with a peritonsillar abscess requiring incision and drainage approximately a month ago presenting complaining of sore throat. Patient report for the past 3 days she has noticed throat irritation primarily to the left side of her throat and she also noticed increased redness and swelling. Pain is rated as 4 out of 10 but worse when she talks or swallows. She does not complain of any fever or chills no chest pain trouble breathing. She report last month she had similar symptoms for over a week and by the time she was seen in the ED her symptom was significant requiring incision and drainage of peritonsillar abscess. She she was instructed by ENT to come to the ER if her symptoms return and to request for steroid and antibiotic. She is allergic to multiple antibiotics. She has been fully vaccinated for COVID-19.  Past Medical History:  Diagnosis Date  . Asthma    mild by PFT's 03/16/1998  . Cervical lymphadenopathy    biopsy negativeMarvetta Gibbons)  . Chronic hypertension   . Complete spontaneous abortion    x2  . GERD (gastroesophageal reflux disease)   . History of abnormal Pap smear    s/p cryo( Dr. Wiliam Ke)  . HSV (herpes simplex virus) anogenital infection   . Hypertension   . Hypothyroidism   . Kidney stone   . Normal vaginal delivery    x3  . Rheumatoid arthritis (HCC)   . Thyroid disease affecting pregnancy   . 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   . Vaginal Pap smear, abnormal   . Vitamin B12 deficiency     Patient Active Problem List   Diagnosis Date Noted  . Tonsillar abscess   . Throat pain in adult   . Pharyngitis 01/12/2020  . Otitis externa 01/10/2020  . Vitamin B12 deficiency   . Rash of both hands 02/14/2019  . Costochondritis 02/14/2019  . Cough 01/07/2018  . Hyperthyroidism 05/14/2017  . 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 GERD (gastroesophageal reflux disease) 12/03/2016  . History of ELISA positive for HSV 10/06/2016  . Alopecia 03/27/2016  . Fatty liver 01/05/2016  . Hiatal hernia 01/05/2016  . Overweight (BMI 25.0-29.9) 01/05/2016  . Healthcare maintenance 09/12/2014  . Seasonal allergies 11/02/2013  . 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 Carpal tunnel syndrome 04/10/2011  . HERNIATED DISC 12/31/2009  . Essential hypertension 06/15/2007    Past Surgical History:  Procedure Laterality Date  . biopsy of lymph node in R neck  2007.  Marland Kitchen LAPAROSCOPIC APPENDECTOMY N/A 05/08/2019   Procedure: APPENDECTOMY LAPAROSCOPIC WITH DIAGNOSTIC LAPAROSCOPY;  Surgeon: [name], MD;  Location: [facility] ORS;  Service: General;  Laterality: N/A;  . LEEP       OB History    Gravida  7   Para  4   Term  3   Preterm  1   AB  3   Living  4     SAB  3   IAB      Ectopic      Multiple  0   Live Births  4           Family History  Problem Relation Age of Onset  . Ulcerative colitis Mother   . Hypertension Mother   . Diabetes Father   . Heart attack Father   . Hypertension Father   . Diabetes Sister   . Thyroid disease Sister   . Heart attack Maternal Grandmother   . Stroke Maternal Grandmother   . Breast cancer Neg Hx     Social History   Tobacco Use  . Smoking status: Never Smoker  . Smokeless tobacco: Never Used  Vaping Use  . Vaping Use: Never used  Substance Use Topics  . Alcohol use: Yes    Alcohol/week: 0.0 standard drinks    Comment: Sometimes.  . Drug  use: No    Home Medications Prior to Admission medications   Medication Sig Start Date End Date Taking? Authorizing Provider  acetaminophen (TYLENOL) 500 MG tablet Take 1,000 mg by mouth every 6 (six) hours as needed for mild pain, moderate pain or headache.    [provider]  Adalimumab (HUMIRA PEN) 40 MG/0.4ML PNKT Inject 40 mg into the skin once a week. Every Friday     [provider]  amLODipine (NORVASC) 10 MG tablet TAKE 1 TABLET(10 MG) BY MOUTH DAILY Patient taking differently: Take 10 mg by mouth daily.  10/09/19   Claudean Severance, MD  Chlorphen-Pseudoephed-APAP (CORICIDIN D PO) Take 1 tablet by mouth as needed (congestion).    [provider]  cyanocobalamin (,VITAMIN B-12,) 1000 MCG/ML injection ADMINISTER 1 ML(1000 MCG) IN THE MUSCLE EVERY 30 DAYS 02/08/20   Roylene Reason, MD  doxycycline (VIBRAMYCIN) 100 MG capsule Take 1 capsule (100 mg total) by mouth 2 (two) times daily. Patient not taking: Reported on 01/14/2020 11/03/19   Brock Bad, MD  famotidine (PEPCID) 20 MG tablet Take 20 mg by mouth daily. 12/18/19   [provider]  methimazole (TAPAZOLE) 5 MG tablet TAKE 1 TABLET(5 MG) BY MOUTH DAILY Patient taking differently: Take 5 mg by mouth daily.  01/10/20   Romero Belling, MD  MULTIPLE VITAMIN PO Take 1 tablet by mouth daily.    [provider]  neomycin-polymyxin-hydrocortisone (CORTISPORIN) 3.5-10000-1 OTIC suspension Place 4 drops into the left ear 4 (four) times daily. Patient not taking: Reported on 01/14/2020 01/09/20   Claudean Severance, MD  NORTREL 1/35, 28, tablet TAKE 1 TABLET BY MOUTH DAILY Patient taking differently: Take 1 tablet by mouth daily.  01/10/19   Brock Bad, MD  polyvinyl alcohol (LIQUIFILM TEARS) 1.4 % ophthalmic solution Place 1 drop into both eyes as needed for dry eyes.    [provider]  valACYclovir (VALTREX) 500 MG tablet Take 1 tablet bid x 3 days prn outbreaks. Patient taking  differently: Take 500 mg by mouth See admin instructions. 500mg  twice daily for 3 days as needed for breakouts 12/03/19   12/05/19, MD    Allergies    Ciprofloxacin, Clindamycin/lincomycin, Sulfamethoxazole-trimethoprim, and Amoxicillin  Review of Systems   Review of Systems  Constitutional: Negative for fever.  HENT: Positive for sore throat.     Physical Exam Updated Vital Signs BP (!) 145/103 (BP Location: Right Arm)   Pulse (!) 104   Temp 98.9 F (37.2 C) (Oral)   Resp 18   SpO2 100%   Physical Exam Vitals  and nursing note reviewed.  Constitutional:      General: She is not in acute distress.    Appearance: She is well-developed and well-nourished.  HENT:     Head: Atraumatic.     Mouth/Throat:     Mouth: Mucous membranes are moist.     Pharynx: Uvula midline. Posterior oropharyngeal erythema (Mild posterior oropharyngeal erythema with minimal fullness to left peritonsillar pillar) present. No uvula swelling.  Eyes:     Conjunctiva/sclera: Conjunctivae normal.  Cardiovascular:     Rate and Rhythm: Normal rate and regular rhythm.     Heart sounds: Normal heart sounds.  Pulmonary:     Breath sounds: No wheezing, rhonchi or rales.  Musculoskeletal:     Cervical back: Neck supple.  Skin:    Findings: No rash.  Neurological:     Mental Status: She is alert.  Psychiatric:        Mood and Affect: Mood and affect and mood normal.     ED Results / Procedures / Treatments   Labs (all labs ordered are listed, but only abnormal results are displayed) Labs Reviewed - No data to display  EKG None  Radiology No results found.  Procedures Procedures (including critical care time)  Medications Ordered in ED Medications  dexamethasone (DECADRON) injection 10 mg (has no administration in time range)  lidocaine (XYLOCAINE) 2 % viscous mouth solution 15 mL (has no administration in time range)    ED Course  I have reviewed the triage vital signs and the  nursing notes.  Pertinent labs & imaging results that were available during my care of the patient were reviewed by me and considered in my medical decision making (see chart for details).    MDM Rules/Calculators/A&P                          BP (!) 145/103 (BP Location: Right Arm)   Pulse (!) 104   Temp 98.9 F (37.2 C) (Oral)   Resp 18   SpO2 100%   Final Clinical Impression(s) / ED Diagnoses Final diagnoses:  Pharyngitis due to Streptococcus species    Rx / DC Orders ED Discharge Orders         Ordered    doxycycline (VIBRAMYCIN) 100 MG capsule  2 times daily        03/02/20 0735    cephALEXin (KEFLEX) 500 MG capsule        03/02/20 0735         7:30 AM Patient had a left peritonsillar abscess requiring incision and drainage Approximately month ago here with sore throat and discomfort to the left side of her throat similar to her prior PTA. Examination reveals some mild erythema and mild fullness without any obvious signs of peritonsillar abscess. Uvula is midline, normal phonation, patient states he was instructed to return to the ED for antibiotic and steroid return if symptoms return. Will give Decadron here as well as viscous lidocaine. Patient previously was prescribed Keflex and doxycycline as she is allergic to clindamycin, Cipro, and amoxicillin. We will repeat this antibiotic and instruct patient to follow-up closely with ENT for further care. She admits she needs to have her tonsillar surgically remove in the near future.    Fayrene Helper, PA-C 03/02/20 0736    Fayrene Helper, PA-C 03/02/20 0741    Virgina Norfolk, DO 03/02/20 [phone]

## 2020-03-02 NOTE — Discharge Instructions (Signed)
You have been seen and evaluated for your sore throat. Take antibiotics and steroid as prescribed. Call and follow-up closely with your ear nose and throat specialist for further care.

## 2020-03-02 NOTE — ED Triage Notes (Signed)
Patient states hx PTA. Patient states that Thursday she began having throat pain and swelling. No SOB, airway patent. Patient states PTA last month and her ENT stated to her to seek help if it came back while the office is closed.

## 2020-03-04 ENCOUNTER — Telehealth: Payer: Self-pay | Admitting: *Deleted

## 2020-03-04 ENCOUNTER — Other Ambulatory Visit: Payer: Self-pay | Admitting: Otolaryngology

## 2020-03-04 NOTE — Telephone Encounter (Signed)
Transition Care Management Unsuccessful Follow-up Telephone Call  Date of discharge and from where:  03/02/20 [name]  Attempts:  1st Attempt  Reason for unsuccessful TCM follow-up call:  No answer/busy

## 2020-03-05 NOTE — Telephone Encounter (Signed)
Patient has an appointment with Otolaryngology [facility], DO on 03/06/2020 at 2:15pm. Closing encounter.

## 2020-03-06 DIAGNOSIS — J36 Peritonsillar abscess: Secondary | ICD-10-CM | POA: Diagnosis not present

## 2020-03-07 ENCOUNTER — Other Ambulatory Visit: Payer: Self-pay | Admitting: Obstetrics

## 2020-03-07 DIAGNOSIS — N939 Abnormal uterine and vaginal bleeding, unspecified: Secondary | ICD-10-CM

## 2020-03-29 ENCOUNTER — Ambulatory Visit: Payer: Medicaid Other | Admitting: Endocrinology

## 2020-04-05 DIAGNOSIS — J36 Peritonsillar abscess: Secondary | ICD-10-CM | POA: Diagnosis not present

## 2020-04-08 DIAGNOSIS — K219 Gastro-esophageal reflux disease without esophagitis: Secondary | ICD-10-CM | POA: Diagnosis not present

## 2020-04-08 DIAGNOSIS — K51211 Ulcerative (chronic) proctitis with rectal bleeding: Secondary | ICD-10-CM | POA: Diagnosis not present

## 2020-04-12 ENCOUNTER — Other Ambulatory Visit: Payer: Self-pay | Admitting: Internal Medicine

## 2020-04-12 ENCOUNTER — Other Ambulatory Visit: Payer: Self-pay | Admitting: Endocrinology

## 2020-04-12 DIAGNOSIS — I10 Essential (primary) hypertension: Secondary | ICD-10-CM

## 2020-04-12 NOTE — Telephone Encounter (Signed)
1.  Please schedule f/u appt 2.  Then please refill x 2 mos, pending that appt.  

## 2020-04-23 ENCOUNTER — Other Ambulatory Visit: Payer: Self-pay | Admitting: Endocrinology

## 2020-04-24 ENCOUNTER — Encounter (HOSPITAL_COMMUNITY): Payer: Self-pay | Admitting: Otolaryngology

## 2020-04-24 ENCOUNTER — Other Ambulatory Visit (HOSPITAL_COMMUNITY)
Admission: RE | Admit: 2020-04-24 | Discharge: 2020-04-24 | Disposition: A | Discharge status: Home / Self Care | Payer: Medicaid Other | Source: Ambulatory Visit | Attending: Otolaryngology | Admitting: Otolaryngology

## 2020-04-24 DIAGNOSIS — Z20822 Contact with and (suspected) exposure to covid-19: Secondary | ICD-10-CM | POA: Insufficient documentation

## 2020-04-24 DIAGNOSIS — Z01812 Encounter for preprocedural laboratory examination: Secondary | ICD-10-CM | POA: Diagnosis not present

## 2020-04-24 LAB — SARS CORONAVIRUS 2 (TAT 6-24 HRS): SARS Coronavirus 2: NEGATIVE

## 2020-04-24 NOTE — Progress Notes (Signed)
Patient denies shortness of breath, fever, cough or chest pain.  PCP - [facility] Cardiologist - n/a Rheumatology - Dr Hezzie Bump  Chest x-ray - n/a EKG - 04/27/19 Stress Test - n/a ECHO - 11/25/12 Cardiac Cath - n/a  STOP now taking any Aspirin (unless otherwise instructed by your surgeon), Aleve, Naproxen, Ibuprofen, Motrin, Advil, Goody's, BC's, all herbal medications, fish oil, and all vitamins.   Coronavirus Screening Covid test is scheduled on 04/24/20. Do you have any of the following symptoms:  Cough yes/no: No Fever (>100.21F)  yes/no: No Runny nose yes/no: No Sore throat yes/no: No Difficulty breathing/shortness of breath  yes/no: No  Have you traveled in the last 14 days and where? yes/no: No  Patient verbalized understanding of instructions that were given via phone.

## 2020-04-26 ENCOUNTER — Encounter (HOSPITAL_COMMUNITY): Payer: Self-pay | Admitting: Otolaryngology

## 2020-04-26 ENCOUNTER — Ambulatory Visit (HOSPITAL_COMMUNITY)
Admission: RE | Admit: 2020-04-26 | Discharge: 2020-04-26 | Disposition: A | Discharge status: Home / Self Care | Payer: Medicaid Other | Attending: Otolaryngology | Admitting: Otolaryngology

## 2020-04-26 ENCOUNTER — Other Ambulatory Visit: Payer: Self-pay

## 2020-04-26 ENCOUNTER — Ambulatory Visit (HOSPITAL_COMMUNITY): Payer: Medicaid Other | Admitting: Registered Nurse

## 2020-04-26 ENCOUNTER — Encounter (HOSPITAL_COMMUNITY)
Admission: RE | Disposition: A | Discharge status: Home / Self Care | Payer: Self-pay | Source: Home / Self Care | Attending: Otolaryngology

## 2020-04-26 DIAGNOSIS — Z881 Allergy status to other antibiotic agents status: Secondary | ICD-10-CM | POA: Insufficient documentation

## 2020-04-26 DIAGNOSIS — I10 Essential (primary) hypertension: Secondary | ICD-10-CM | POA: Diagnosis not present

## 2020-04-26 DIAGNOSIS — J0391 Acute recurrent tonsillitis, unspecified: Secondary | ICD-10-CM | POA: Diagnosis present

## 2020-04-26 DIAGNOSIS — J36 Peritonsillar abscess: Secondary | ICD-10-CM | POA: Insufficient documentation

## 2020-04-26 DIAGNOSIS — J45909 Unspecified asthma, uncomplicated: Secondary | ICD-10-CM | POA: Diagnosis not present

## 2020-04-26 DIAGNOSIS — E039 Hypothyroidism, unspecified: Secondary | ICD-10-CM | POA: Diagnosis not present

## 2020-04-26 DIAGNOSIS — Z880 Allergy status to penicillin: Secondary | ICD-10-CM | POA: Diagnosis not present

## 2020-04-26 DIAGNOSIS — Z79899 Other long term (current) drug therapy: Secondary | ICD-10-CM | POA: Insufficient documentation

## 2020-04-26 HISTORY — PX: TONSILLECTOMY: SHX5217

## 2020-04-26 HISTORY — DX: Personal history of urinary calculi: Z87.442

## 2020-04-26 LAB — BASIC METABOLIC PANEL
Anion gap: 12 (ref 5–15)
BUN: 7 mg/dL (ref 6–20)
CO2: 20 mmol/L — ABNORMAL LOW (ref 22–32)
Calcium: 8.5 mg/dL — ABNORMAL LOW (ref 8.9–10.3)
Chloride: 106 mmol/L (ref 98–111)
Creatinine, Ser: 0.83 mg/dL (ref 0.44–1.00)
GFR, Estimated: >60 mL/min (ref >60)
Glucose, Bld: 97 mg/dL (ref 70–99)
Potassium: 3.4 mmol/L — ABNORMAL LOW (ref 3.5–5.1)
Sodium: 138 mmol/L (ref 135–145)

## 2020-04-26 LAB — CBC
HCT: 42 % (ref 36.0–46.0)
Hemoglobin: 13.8 g/dL (ref 12.0–15.0)
MCH: 29.2 pg (ref 26.0–34.0)
MCHC: 32.9 g/dL (ref 30.0–36.0)
MCV: 88.8 fL (ref 80.0–100.0)
Platelets: 357 10*3/uL (ref 150–400)
RBC: 4.73 MIL/uL (ref 3.87–5.11)
RDW: 14.8 % (ref 11.5–15.5)
WBC: 6.5 10*3/uL (ref 4.0–10.5)
nRBC: 0 % (ref 0.0–0.2)

## 2020-04-26 LAB — POCT PREGNANCY, URINE: Preg Test, Ur: NEGATIVE

## 2020-04-26 SURGERY — TONSILLECTOMY
Anesthesia: General | Site: Throat | Laterality: Bilateral

## 2020-04-26 MED ORDER — CHLORHEXIDINE GLUCONATE 0.12 % MT SOLN
OROMUCOSAL | Status: AC
  Administered 2020-04-26: 15 mL via OROMUCOSAL
  Filled 2020-04-26: qty 15

## 2020-04-26 MED ORDER — AMISULPRIDE (ANTIEMETIC) 5 MG/2ML IV SOLN: 10.0000 mg | Freq: Once | INTRAVENOUS | Status: DC | PRN

## 2020-04-26 MED ORDER — ONDANSETRON HCL 4 MG PO TABS: 4.0000 mg | ORAL_TABLET | Freq: Three times a day (TID) | ORAL | 0 refills | Status: AC | PRN

## 2020-04-26 MED ORDER — GLYCOPYRROLATE 0.2 MG/ML IJ SOLN
INTRAMUSCULAR | Status: DC | PRN
  Administered 2020-04-26: .1 mg via INTRAVENOUS

## 2020-04-26 MED ORDER — OXYCODONE HCL 5 MG/5ML PO SOLN: 5.0000 mg | Freq: Once | ORAL | Status: DC | PRN

## 2020-04-26 MED ORDER — OXYCODONE HCL 5 MG PO TABS: 5.0000 mg | ORAL_TABLET | Freq: Once | ORAL | Status: DC | PRN

## 2020-04-26 MED ORDER — PROPOFOL 10 MG/ML IV BOLUS
INTRAVENOUS | Status: AC
  Filled 2020-04-26: qty 20

## 2020-04-26 MED ORDER — FENTANYL CITRATE (PF) 100 MCG/2ML IJ SOLN
INTRAMUSCULAR | Status: AC
  Filled 2020-04-26: qty 2

## 2020-04-26 MED ORDER — FENTANYL CITRATE (PF) 100 MCG/2ML IJ SOLN
25.0000 ug | INTRAMUSCULAR | Status: DC | PRN
  Administered 2020-04-26 (×2): 50 ug via INTRAVENOUS

## 2020-04-26 MED ORDER — DOCUSATE SODIUM 100 MG PO CAPS: 100.0000 mg | ORAL_CAPSULE | Freq: Two times a day (BID) | ORAL | 0 refills | Status: AC | PRN

## 2020-04-26 MED ORDER — SUCCINYLCHOLINE CHLORIDE 200 MG/10ML IV SOSY
PREFILLED_SYRINGE | INTRAVENOUS | Status: DC | PRN
  Administered 2020-04-26: 120 mg via INTRAVENOUS

## 2020-04-26 MED ORDER — LIDOCAINE 2% (20 MG/ML) 5 ML SYRINGE
INTRAMUSCULAR | Status: DC | PRN
  Administered 2020-04-26: 80 mg via INTRAVENOUS

## 2020-04-26 MED ORDER — 0.9 % SODIUM CHLORIDE (POUR BTL) OPTIME
TOPICAL | Status: DC | PRN
  Administered 2020-04-26: 1000 mL

## 2020-04-26 MED ORDER — DEXAMETHASONE SODIUM PHOSPHATE 10 MG/ML IJ SOLN
INTRAMUSCULAR | Status: DC | PRN
  Administered 2020-04-26 (×2): 5 mg via INTRAVENOUS

## 2020-04-26 MED ORDER — FENTANYL CITRATE (PF) 250 MCG/5ML IJ SOLN
INTRAMUSCULAR | Status: AC
  Filled 2020-04-26: qty 5

## 2020-04-26 MED ORDER — MIDAZOLAM HCL 2 MG/2ML IJ SOLN
INTRAMUSCULAR | Status: AC
  Filled 2020-04-26: qty 2

## 2020-04-26 MED ORDER — CHLORHEXIDINE GLUCONATE 0.12 % MT SOLN: 15.0000 mL | Freq: Once | OROMUCOSAL | Status: AC

## 2020-04-26 MED ORDER — FENTANYL CITRATE (PF) 250 MCG/5ML IJ SOLN
INTRAMUSCULAR | Status: DC | PRN
  Administered 2020-04-26 (×2): 50 ug via INTRAVENOUS

## 2020-04-26 MED ORDER — PROPOFOL 10 MG/ML IV BOLUS
INTRAVENOUS | Status: DC | PRN
  Administered 2020-04-26: 200 mg via INTRAVENOUS

## 2020-04-26 MED ORDER — ACETAMINOPHEN 500 MG PO TABS: 500.0000 mg | ORAL_TABLET | Freq: Three times a day (TID) | ORAL | 0 refills | Status: DC | PRN

## 2020-04-26 MED ORDER — HYDROCODONE-ACETAMINOPHEN 7.5-325 MG/15ML PO SOLN: 15.0000 mL | ORAL | 0 refills | Status: AC | PRN

## 2020-04-26 MED ORDER — MIDAZOLAM HCL 5 MG/5ML IJ SOLN
INTRAMUSCULAR | Status: DC | PRN
  Administered 2020-04-26: 2 mg via INTRAVENOUS

## 2020-04-26 MED ORDER — LACTATED RINGERS IV SOLN: INTRAVENOUS | Status: DC

## 2020-04-26 MED ORDER — PROMETHAZINE HCL 25 MG/ML IJ SOLN: 6.2500 mg | INTRAMUSCULAR | Status: DC | PRN

## 2020-04-26 MED ORDER — SCOPOLAMINE 1 MG/3DAYS TD PT72
MEDICATED_PATCH | TRANSDERMAL | Status: DC | PRN
  Administered 2020-04-26: 1 via TRANSDERMAL

## 2020-04-26 MED ORDER — ESMOLOL HCL 100 MG/10ML IV SOLN
INTRAVENOUS | Status: DC | PRN
  Administered 2020-04-26: 30 mg via INTRAVENOUS
  Administered 2020-04-26: 20 mg via INTRAVENOUS

## 2020-04-26 MED ORDER — ORAL CARE MOUTH RINSE: 15.0000 mL | Freq: Once | OROMUCOSAL | Status: AC

## 2020-04-26 MED ORDER — ONDANSETRON HCL 4 MG/2ML IJ SOLN
INTRAMUSCULAR | Status: DC | PRN
  Administered 2020-04-26: 4 mg via INTRAVENOUS

## 2020-04-26 SURGICAL SUPPLY — 30 items
CANISTER SUCT 3000ML PPV (MISCELLANEOUS) ×2 IMPLANT
CATH ROBINSON RED A/P 10FR (CATHETERS) IMPLANT
CATH ROBINSON RED A/P 12FR (CATHETERS) ×2 IMPLANT
CLEANER TIP ELECTROSURG 2X2 (MISCELLANEOUS) ×2 IMPLANT
COAGULATOR SUCT SWTCH 10FR 6 (ELECTROSURGICAL) ×2 IMPLANT
CONT SPEC 4OZ CLIKSEAL STRL BL (MISCELLANEOUS) ×2 IMPLANT
COVER WAND RF STERILE (DRAPES) ×2 IMPLANT
ELECT COATED BLADE 2.86 ST (ELECTRODE) ×2 IMPLANT
ELECT REM PT RETURN 9FT ADLT (ELECTROSURGICAL) ×2
ELECT REM PT RETURN 9FT PED (ELECTROSURGICAL)
ELECTRODE REM PT RETRN 9FT PED (ELECTROSURGICAL) IMPLANT
ELECTRODE REM PT RTRN 9FT ADLT (ELECTROSURGICAL) IMPLANT
GAUZE 4X4 16PLY RFD (DISPOSABLE) ×2 IMPLANT
GLOVE BIO SURGEON STRL SZ 6.5 (GLOVE) ×2 IMPLANT
GOWN STRL REUS W/ TWL LRG LVL3 (GOWN DISPOSABLE) ×2 IMPLANT
GOWN STRL REUS W/TWL LRG LVL3 (GOWN DISPOSABLE) ×4
KIT BASIN OR (CUSTOM PROCEDURE TRAY) ×2 IMPLANT
KIT TURNOVER KIT B (KITS) ×2 IMPLANT
NS IRRIG 1000ML POUR BTL (IV SOLUTION) ×2 IMPLANT
PACK SURGICAL SETUP 50X90 (CUSTOM PROCEDURE TRAY) ×2 IMPLANT
PAD ARMBOARD 7.5X6 YLW CONV (MISCELLANEOUS) IMPLANT
PENCIL SMOKE EVACUATOR (MISCELLANEOUS) ×2 IMPLANT
POSITIONER HEAD DONUT 9IN (MISCELLANEOUS) ×2 IMPLANT
SPONGE TONSIL TAPE 1 RFD (DISPOSABLE) ×1 IMPLANT
SPONGE TONSIL TAPE 1.25 RFD (DISPOSABLE) ×2 IMPLANT
SYR BULB EAR ULCER 3OZ GRN STR (SYRINGE) ×2 IMPLANT
TOWEL GREEN STERILE FF (TOWEL DISPOSABLE) ×2 IMPLANT
TUBE CONNECTING 12X1/4 (SUCTIONS) ×2 IMPLANT
TUBE SALEM SUMP 16 FR W/ARV (TUBING) ×2 IMPLANT
YANKAUER SUCT BULB TIP NO VENT (SUCTIONS) ×2 IMPLANT

## 2020-04-26 NOTE — Anesthesia Preprocedure Evaluation (Signed)
Anesthesia Evaluation  Patient identified by MRN, date of birth, ID band Patient awake    Reviewed: Allergy & Precautions, NPO status , Patient's Chart, lab work & pertinent test results  Airway Mallampati: II  TM Distance: >3 FB Neck ROM: Full    Dental no notable dental hx.    Pulmonary asthma ,    Pulmonary exam normal breath sounds clear to auscultation       Cardiovascular hypertension, negative cardio ROS Normal cardiovascular exam Rhythm:Regular Rate:Normal     Neuro/Psych  Neuromuscular disease negative psych ROS   GI/Hepatic PUD, GERD  ,  Endo/Other  Hypothyroidism   Renal/GU   negative genitourinary   Musculoskeletal  (+) Arthritis ,   Abdominal Normal abdominal exam  (+)   Peds negative pediatric ROS (+)  Hematology   Anesthesia Other Findings   Reproductive/Obstetrics negative OB ROS                             Anesthesia Physical Anesthesia Plan  ASA: II  Anesthesia Plan: General   Post-op Pain Management:    Induction: Intravenous  PONV Risk Score and Plan: Scopolamine patch - Pre-op, Midazolam, Ondansetron and Dexamethasone  Airway Management Planned: Oral ETT  Additional Equipment: None  Intra-op Plan:   Post-operative Plan: Extubation in OR  Informed Consent: I have reviewed the patients History and Physical, chart, labs and discussed the procedure including the risks, benefits and alternatives for the proposed anesthesia with the patient or authorized representative who has indicated his/her understanding and acceptance.     Dental advisory given  Plan Discussed with: CRNA and Anesthesiologist  Anesthesia Plan Comments:         Anesthesia Quick Evaluation

## 2020-04-26 NOTE — Op Note (Signed)
OPERATIVE NOTE  [name] Date/Time of Admission: 04/26/2020  7:43 AM  CSN: [id number];EEF:[id number] Attending Provider: Cheron Schaumann A, DO Room/Bed: MCPO/NONE DOB: 11-Jan-1976 Age: 45 y.o.   Pre-Op Diagnosis: Tonsillar abscess  Post-Op Diagnosis: Tonsillar abscess  Procedure: Procedure(s): TONSILLECTOMY  Anesthesia: General  Surgeon(s): Aleighya Mcanelly A Terris Germano, DO  Staff: Circulator: [place], Karrie S, RN Relief Circulator: Rolan Lipa, RN Scrub Person: Ocie Bob, RN  Implants: * No implants in log *  Specimens: ID Type Source Tests Collected by Time Destination  1 : Right Tonsil Tissue PATH Tonsil/[facility]denoid SURGICAL PATHOLOGY Zierra Laroque A, DO 04/26/2020 1010   2 : Left Tonsil Tissue PATH Tonsil/Adenoid SURGICAL PATHOLOGY Jenisse Vullo A, DO 04/26/2020 1011     Complications: None  EBL: 15 ML  Condition: stable  Operative Findings:  Enlarged, chronically inflamed tonsils with tonsilliths  Description of Operation: Once operative consent was obtained, and the surgical site confirmed with the operating room team, the patient was brought back to the operating room and general endotracheal anesthesia was obtained. The patient was turned over to the ENT service. A Crow-Davis mouth gag was used to expose the oral cavity and oropharynx. A red rubber catheter was placed from the right nasal cavity to the oral cavity to retract the soft palate. Attention was first turned to the right tonsil, which was excised at the level of the capsule using electrocautery. Hemostasis was obtained and the tonsillar fossa and the tonsil was sent for specimen. The exact procedure was repeated on the left side. The patient was relieved from oral suspension and then placed back in oral suspension to assure hemostasis, which was obtained. An oral gastric tube was placed into the stomach and suctioned to reduce postoperative nausea. The patient was turned back over to the  anesthesia service and extubated in the room without event. The patient was then transferred to the PACU in stable condition.   Laren Boom, DO [facility]  04/26/2020

## 2020-04-26 NOTE — Anesthesia Procedure Notes (Signed)
Procedure Name: Intubation Date/Time: 04/26/2020 10:20 AM Performed by: Trinna Post., CRNA Pre-anesthesia Checklist: Patient identified, Emergency Drugs available, Suction available, Patient being monitored and Timeout performed Patient Re-evaluated:Patient Re-evaluated prior to induction Oxygen Delivery Method: Circle system utilized Preoxygenation: Pre-oxygenation with 100% oxygen Induction Type: IV induction, Rapid sequence and Cricoid Pressure applied Laryngoscope Size: Mac and 3 Grade View: Grade I Tube type: Oral Rae Tube size: 7.0 mm Number of attempts: 1 Placement Confirmation: ETT inserted through vocal cords under direct vision,  positive ETCO2 and breath sounds checked- equal and bilateral Tube secured with: Tape Dental Injury: Teeth and Oropharynx as per pre-operative assessment

## 2020-04-26 NOTE — H&P (Signed)
Sue [name] is an 45 y.o. female.    Chief Complaint:  Recurrent tonsillitis, peritonsillar abscess  HPI: Patient presents today for planned elective procedure.  She was most recently seen in our office on 04/05/2020 with symptoms of throat pain.  She was noted to have fullness of the left tonsil, concerning for an early peritonsillar abscess and was treated with oral antibiotics.  She states her symptoms resolved following treatment. She has been treated with several rounds of oral antibiotics and steroids for tonsillar abscesses since November 2021.  She also underwent bedside incision and drainage of peritonsillar abscess by ENT physician, Dr. Elijah Birk on 01/14/2020.   Past Medical History:  Diagnosis Date  . Asthma    mild by PFT's 03/16/1998  . Cervical lymphadenopathy    biopsy negativeMarvetta Gibbons)  . Chronic hypertension   . Complete spontaneous abortion    x2  . GERD (gastroesophageal reflux disease)   . History of abnormal Pap smear    s/p cryo( Dr. Wiliam Ke)  . History of kidney stones    passed stones  . HSV (herpes simplex virus) anogenital infection   . Hypertension   . Hypothyroidism   . Normal vaginal delivery    x3  . Rheumatoid arthritis (HCC)   . Thyroid disease affecting pregnancy   . 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   . Vaginal Pap smear, abnormal   . Vitamin B12 deficiency     Past Surgical History:  Procedure Laterality Date  . biopsy of lymph node in R neck  2007.  Marland Kitchen LAPAROSCOPIC APPENDECTOMY N/A 05/08/2019   Procedure: APPENDECTOMY LAPAROSCOPIC WITH DIAGNOSTIC LAPAROSCOPY;  Surgeon: [name], MD;  Location: [facility];  Service: General;  Laterality: N/A;  . LEEP    . WISDOM TOOTH EXTRACTION      Family History  Problem Relation Age of Onset  . Ulcerative colitis Mother   . Hypertension Mother    . Diabetes Father   . Heart attack Father   . Hypertension Father   . Diabetes Sister   . Thyroid disease Sister   . Heart attack Maternal Grandmother   . Stroke Maternal Grandmother   . Breast cancer Neg Hx     Social History:  reports that she has never smoked. She has never used smokeless tobacco. She reports current alcohol use. She reports that she does not use drugs.  Allergies:  Allergies  Allergen Reactions  . Ciprofloxacin Anaphylaxis and Swelling  . Clindamycin/Lincomycin Anaphylaxis and Swelling  . Sulfamethoxazole-Trimethoprim Swelling    Swelling is of the lips  . Amoxicillin Rash    Has patient had a PCN reaction causing immediate rash, facial/tongue/throat swelling, SOB or lightheadedness with hypotension: no Has patient had a PCN reaction causing severe rash involving mucus membranes or skin necrosis: pt had rash -- non severe  Has patient had a PCN reaction that required hospitalization: no Has patient had a PCN reaction occurring within the last 10 years: no If all of the above answers are "NO", then may proceed with Cephalosporin use.     Medications Prior to Admission  Medication Sig Dispense Refill  . acetaminophen (TYLENOL) 500 MG tablet Take 1,000 mg by mouth every 6 (six) hours as needed for mild pain, moderate pain or headache.    . albuterol (VENTOLIN HFA) 108 (90 Base) MCG/ACT inhaler Inhale 1-2 puffs into the  lungs every 6 (six) hours as needed for wheezing or shortness of breath.    Marland Kitchen amLODipine (NORVASC) 10 MG tablet TAKE 1 TABLET(10 MG) BY MOUTH DAILY (Patient taking differently: Take 10 mg by mouth at bedtime.) 90 tablet 1  . calcium carbonate (TUMS - DOSED IN MG ELEMENTAL CALCIUM) 500 MG chewable tablet Chew 1-2 tablets by mouth 3 (three) times daily as needed for indigestion or heartburn.    . clobetasol cream (TEMOVATE) 0.05 % Apply 1 application topically 2 (two) times daily as needed.    . cyanocobalamin (,VITAMIN B-12,) 1000 MCG/ML injection  ADMINISTER 1 ML(1000 MCG) IN THE MUSCLE EVERY 30 DAYS (Patient taking differently: Inject 1,000 mcg into the muscle every 30 (thirty) days.) 1 mL 3  . Dextromethorphan-guaiFENesin (CORICIDIN HBP CONGESTION/COUGH) 10-200 MG CAPS Take 1-2 tablets by mouth 3 (three) times daily as needed (cough/cold).    . folic acid (FOLVITE) 1 MG tablet Take 1 mg by mouth at bedtime.    Marland Kitchen HUMIRA PEN 40 MG/0.8ML PNKT Inject 40 mg into the skin every Friday.    . mesalamine (CANASA) 1000 MG suppository Place 1,000 mg rectally at bedtime as needed (ulcerative proctitis).    . methotrexate 2.5 MG tablet Take 10 mg by mouth every Friday.    . Multiple Vitamin (MULTIVITAMIN WITH MINERALS) TABS tablet Take 1 tablet by mouth daily.    Marland Kitchen NORTREL 1/35, 28, tablet TAKE 1 TABLET BY MOUTH DAILY (Patient taking differently: Take 1 tablet by mouth at bedtime.) 84 tablet 4  . valACYclovir (VALTREX) 500 MG tablet Take 1 tablet bid x 3 days prn outbreaks. (Patient taking differently: Take 500 mg by mouth See admin instructions. Take 1 tablet (500 mg) by mouth twice daily for 3 days as needed for breakouts) 30 tablet 11  . doxycycline (VIBRAMYCIN) 100 MG capsule Take 1 capsule (100 mg total) by mouth 2 (two) times daily. (Patient not taking: No sig reported) 14 capsule 0  . methimazole (TAPAZOLE) 5 MG tablet TAKE 1 TABLET(5 MG) BY MOUTH DAILY (Patient taking differently: at bedtime. TAKE 1 TABLET(5 MG) BY MOUTH DAILY) 90 tablet 0  . neomycin-polymyxin-hydrocortisone (CORTISPORIN) 3.5-10000-1 OTIC suspension Place 4 drops into the left ear 4 (four) times daily. (Patient not taking: Reported on 04/18/2020) 11 mL 0    Results for orders placed or performed during the hospital encounter of 04/24/20 (from the past 48 hour(s))  SARS CORONAVIRUS 2 (TAT 6-24 HRS) Nasopharyngeal Nasopharyngeal Swab     Status: None   Collection Time: 04/24/20  2:38 PM   Specimen: Nasopharyngeal Swab  Result Value Ref Range   SARS Coronavirus 2 NEGATIVE  NEGATIVE    Comment: (NOTE) SARS-CoV-2 target nucleic acids are NOT DETECTED.  The SARS-CoV-2 RNA is generally detectable in upper and lower respiratory specimens during the acute phase of infection. Negative results do not preclude SARS-CoV-2 infection, do not rule out co-infections with other pathogens, and should not be used as the sole basis for treatment or other patient management decisions. Negative results must be combined with clinical observations, patient history, and epidemiological information. The expected result is Negative.  Fact Sheet for Patients: HairSlick.no  Fact Sheet for Healthcare Providers: quierodirigir.com  This test is not yet approved or cleared by the [place] FDA and  has been authorized for detection and/or diagnosis of SARS-CoV-2 by FDA under an Emergency Use Authorization (EUA). This EUA will remain  in effect (meaning this test can be used) for the duration of the COVID-19 declaration under  Se ction 564(b)(1) of the Act, 21 U.S.C. section 360bbb-3(b)(1), unless the authorization is terminated or revoked sooner.  Performed at [facility], [address], [place], [place] [address]    No results found.  ROS: ROS  Blood pressure 124/79, pulse 88, temperature 98.3 F (36.8 C), temperature source Oral, resp. rate 20, height 5\' 4"  (1.626 m), weight 82.1 kg, last menstrual period 03/26/2020, SpO2 100 %.  PHYSICAL EXAM: General: Well developed, well nourished. No acute distress. Voice intact, not muffled  Head/Face: Normocephalic, atraumatic. No scars or lesions. No sinus tenderness. Facial nerve intact and equal bilaterally.  Eyes: Pupils are equal, round and reactive to light. Conjunctiva and lids are normal. Normal extraocular mobility.  Ears: Right: Pinna and external meatus normal Left: Pinna and external meatus normal Nose: No gross deformity or lesions.  Mouth/Oropharynx:  Lips normal, teeth and gums normal with good dentition, normal oral vestibule. Normal floor of mouth, tongue and oral mucosa, no mucosal lesions, ulcer or mass, normal tongue mobility. Uvula midline. Hard and soft palate normal with normal mobility, no palatal fullness. Tonsils symmetric, no erythema, no exudate.  Neck: Trachea midline. No masses. No crepitus. Normal thyroid glad palpation without thyromegaly or nodules palpated. Salivary glands normal to palpation without swelling, erythema or mass.  Lymphatic: No lymphadenopathy in the neck.  Extremities: No edema or cyanosis. Warm and well-perfused.  Neurologic: CN II-XII intact. Alert and oriented to self, place and time. Normal reflexes and motor skills, balance and coordination. Moving all extremities without gross abnormality.  Psychiatric: No unusual anxiety or evidence of depression. Appropriate affect.   Studies Reviewed: None   Assessment/Plan Sue [name] is a 45 y.o. female with history of rheumatoid arthritis, and prior history of left-sided peritonsillar abscess requiring incision and drainage, recent history of right sided early peritonsillar abscess which was treated with oral antibiotics.  - To OR today for tonsillectomy. The risks, benefits and possible complications of the procedure were discussed in detail with the patient. She understands and concurs with our plan for surgery.  - Patient to resume Humira as per her rhuematologist   Davit Vassar A Jarica Plass 04/26/2020, 8:29 AM

## 2020-04-26 NOTE — Anesthesia Postprocedure Evaluation (Signed)
Anesthesia Post Note  Patient: Sue [name]  Procedure(s) Performed: TONSILLECTOMY (Bilateral Throat)     Patient location during evaluation: PACU Anesthesia Type: General Level of consciousness: awake Pain management: pain level controlled Vital Signs Assessment: post-procedure vital signs reviewed and stable Respiratory status: spontaneous breathing and respiratory function stable Cardiovascular status: stable Postop Assessment: no apparent nausea or vomiting Anesthetic complications: no   No complications documented.  Last Vitals:  Vitals:   04/26/20 0815 04/26/20 1113  BP: 124/79 126/87  Pulse: 88 99  Resp: 20 17  Temp: 36.8 C 36.9 C  SpO2: 100% 99%    Last Pain:  Vitals:   04/26/20 1113  TempSrc:   PainSc: 0-No pain                 Mellody Dance

## 2020-04-26 NOTE — Discharge Instructions (Signed)
Tonsillectomy Post Operative Instructions   Effects of Anesthesia Tonsillectomy (with or without Adenoidectomy) involves a brief anesthesia,  typically 20 - 60 minutes. Patients may be quite irritable for several hours after  surgery. If sedatives were given, some patients will remain sleepy for much of the  day. Nausea and vomiting is occasionally seen, and usually resolves by the  evening of surgery - even without additional medications.  Medications Tonsillectomy is a painful procedure. Pain medications help but do not  completely alleviate the discomfort.   ADULTS  Adults will be prescribed a narcotic pain pill or elixir (Percocet, Norco,  Vicodin, Lortab are some examples). Do not use aspirin products (Bayers,  Goode powders, Excedrin) - they may increase the chance of bleeding.  Every time you take a dose of pain medication, do so with some food or full  liquid to prevent nausea. The best thing to take with the medication is a  cup of pudding or ice cream, a milkshake or cup of milk. You may take an additional  650mg  of Tylenol every 8 hours if your pain is uncontrolled with the narcotic alone.   Activity  Vigorous exercise should be avoided for 14 days after surgery. This risk of  bleeding is increased with increased activity and bleeding from where the tonsils  were removed can happen for up to 2 weeks after surgery. Baths and showers are fine. Many patients have reduced energy levels until their pain decreases and  they are taking in more nourishment and calories. You should not travel out of  the local area for a full 2 weeks after surgery in case you experience bleeding  after surgery.   Eating & Drinking Dehydration is the biggest enemy in the recovery period. It will increase the pain,  increase the risk of bleeding and delay the healing. It usually happens because  the pain of swallowing keeps the patient from drinking enough liquids. Therefore,  the key is to force  fluids, and that works best when pain control is maximized. You cannot drink too much after having a tonsillectomy. The only drinks to avoid  are citrus like orange and grapefruit juices because they will burn the back of the  throat. Incentive charts with prizes work very well to get young children to drink  fluids and take their medications after surgery. Some patients will have a small  amount of liquid come out of their nose when they drink after surgery, this should  stop within a few weeks after surgery.  Although drinking is more important, eating is fine even the day of surgery but  avoid foods that are crunchy or have sharp edges. Dairy products may be taken,  if desired. You should avoid acidic, salty and spicy foods (especially tomato  sauces). Chewing gum or bubble gum encourages swallowing and saliva flow,  and may even speed up the healing. Almost everyone loses some weight after  tonsillectomy (which is usually regained in the 2nd or 3rd week after surgery).  Drinking is far more important that eating in the first 14 days after surgery, so  concentrate on that first and foremost. Adequate liquid intake probably speeds  Recovery.  Other things  Pain is usually the worst in the morning; this can be avoided by overnight  medication administration if needed.  Since moisture helps soothe the healing throat, a room humidifier (hot or  cold) is suggested when the patient is sleeping.  Some patients feel pain relief with an ice collar  to the neck (or a bag of  frozen peas or corn). Be careful to avoid placing cold plastic directly on the  skin - wrap in a paper towel or washcloth.   If the tonsils and adenoids are very large, the patients voice may change  after surgery.  The recovery from tonsillectomy is a very painful period, often the worst  pain people can recall, so please be understanding and patient with  yourself, or the patient you are caring for. It is helpful to  take pain  medicine during the night if the patient awakens-- the worst pain is usually  in the morning. The pain may seem to increase 2-5 days after surgery - this is normal when inflammation sets in. Please be aware that no  combination of medicines will eliminate the pain - the patient will need to  continue eating/drinking in spite of the remaining discomfort.  You should not travel outside of the local area for 14 days after surgery in  case significant bleeding occurs.   What should we expect after surgery? As previously mentioned, most patients have a significant amount of pain after  tonsillectomy, with pain resolving 7-14 days after surgery. Older children and  adults seem to have more discomfort. Most patients can go home the day of  surgery.  Ear pain: Many people will complain of earaches after tonsillectomy. This  is caused by referred pain coming from throat and not the ears. Give pain  medications and encourage liquid intake.  Fever: Many patients have a low-grade fever after tonsillectomy - up to  101.5 degrees (380 C.) for several days. Higher prolonged fever should be  reported to your surgeon.  Bad looking (and bad smelling) throat: After surgery, the place where  the tonsils were removed is covered with a white film, which is a moist  scab. This usually develops 3-5 days after surgery and falls off 10-14 days  after surgery and usually causes bad breath. There will be some redness  and swelling as well. The uvula (the part of the throat that hangs down in  the middle between the tonsils) is usually swollen for several days after  surgery.  Sore/bruised feeling of Tongue: This is common for the first few days  after surgery because the tongue is pushed out of the way to take out the  tonsils in surgery.  When should we call the doctor?  Nausea/Vomiting: This is a common side effect from General Anesthesia  and can last up to 24-36 hours after surgery. Try  giving sips of clear liquids  like Sprite, water or apple juice then gradually increase fluid intake. If the  nausea or vomiting continues beyond this time frame, call the doctors  office for medications that will help relieve the nausea and vomiting.  Bleeding: Significant bleeding is rare, but it happens to about 5% of  patients who have tonsillectomy. It may come from the nose, the mouth, or  be vomited or coughed up. Ice water mouthwashes may help stop or  reduce bleeding. If you have bleeding that does not stop, you should call  the office (during business hours) or the on call physician (evenings, weekends) or go to the emergency room if you are very concerned.   Dehydration: If there has been little or no liquids intake for 24 hours, the  patient may need to come to the hospital for IV fluids. Signs of dehydration  include lethargy, the lack of tears when crying, and reduced  or very  concentrated urine output.  High Fever: If the patient has a consistent temperatures greater than 102,  or when accompanied by cough or difficulty breathing, you should call the  doctors office.

## 2020-04-26 NOTE — Transfer of Care (Signed)
Immediate Anesthesia Transfer of Care Note  Patient: Sue [name]  Procedure(s) Performed: TONSILLECTOMY (Bilateral Throat)  Patient Location: PACU  Anesthesia Type:General  Level of Consciousness: awake and drowsy  Airway & Oxygen Therapy: Patient Spontanous Breathing and Patient connected to face mask oxygen  Post-op Assessment: Report given to RN and Post -op Vital signs reviewed and stable  Post vital signs: Reviewed and stable  Last Vitals:  Vitals Value Taken Time  BP 126/87 04/26/20 1113  Temp 36.9 C 04/26/20 1113  Pulse 95 04/26/20 1116  Resp 17 04/26/20 1116  SpO2 100 % 04/26/20 1116  Vitals shown include unvalidated device data.  Last Pain:  Vitals:   04/26/20 1113  TempSrc:   PainSc: 0-No pain         Complications: No complications documented.

## 2020-04-27 ENCOUNTER — Encounter (HOSPITAL_COMMUNITY): Payer: Self-pay | Admitting: Otolaryngology

## 2020-04-29 LAB — SURGICAL PATHOLOGY

## 2020-05-09 DIAGNOSIS — R21 Rash and other nonspecific skin eruption: Secondary | ICD-10-CM | POA: Diagnosis not present

## 2020-05-09 DIAGNOSIS — L309 Dermatitis, unspecified: Secondary | ICD-10-CM | POA: Diagnosis not present

## 2020-05-09 DIAGNOSIS — K529 Noninfective gastroenteritis and colitis, unspecified: Secondary | ICD-10-CM | POA: Diagnosis not present

## 2020-05-09 DIAGNOSIS — M129 Arthropathy, unspecified: Secondary | ICD-10-CM | POA: Diagnosis not present

## 2020-07-12 ENCOUNTER — Ambulatory Visit: Payer: Medicaid Other | Admitting: Obstetrics

## 2020-07-18 DIAGNOSIS — R21 Rash and other nonspecific skin eruption: Secondary | ICD-10-CM | POA: Diagnosis not present

## 2020-07-18 DIAGNOSIS — L409 Psoriasis, unspecified: Secondary | ICD-10-CM | POA: Diagnosis not present

## 2020-07-22 ENCOUNTER — Other Ambulatory Visit: Payer: Self-pay | Admitting: Endocrinology

## 2020-07-26 ENCOUNTER — Ambulatory Visit (INDEPENDENT_AMBULATORY_CARE_PROVIDER_SITE_OTHER): Payer: Medicaid Other | Admitting: Endocrinology

## 2020-07-26 ENCOUNTER — Other Ambulatory Visit: Payer: Self-pay

## 2020-07-26 VITALS — BP 120/80 | HR 84 | Ht 64.0 in | Wt 183.8 lb

## 2020-07-26 DIAGNOSIS — E538 Deficiency of other specified B group vitamins: Secondary | ICD-10-CM

## 2020-07-26 DIAGNOSIS — E0590 Thyrotoxicosis, unspecified without thyrotoxic crisis or storm: Secondary | ICD-10-CM | POA: Diagnosis not present

## 2020-07-26 LAB — T4, FREE: Free T4: 0.78 ng/dL (ref 0.60–1.60)

## 2020-07-26 LAB — VITAMIN B12: Vitamin B-12: 760 pg/mL (ref 211–911)

## 2020-07-26 LAB — TSH: TSH: 0.6 u[IU]/mL (ref 0.35–4.50)

## 2020-07-26 NOTE — Patient Instructions (Addendum)
Blood tests are requested for you today.  We'll let you know about the results.  If ever you have fever while taking methimazole, stop it and call us, even if the reason is obvious, because of the risk of a rare side-effect. It is best to never miss the medication.  However, if you do miss it, next best is to double up the next time.   Please come back for a follow-up appointment in 6 months.   

## 2020-07-26 NOTE — Progress Notes (Signed)
Subjective:    Patient ID: Sue [name], female    DOB: 02-19-1976, 45 y.o.   MRN: [id number]  HPI Pt returns for f/u of hyperthyroidism (dx'ed 2010; she has never had dedicated thyroid imaging, but 2014 CT showed 6 x 5 mm nodule in the left lobe of the thyroid; hyperthyroidism was mild, but she was still rx'ed with tapazole, due to poor wt gain; fiancee has had vasectomy).  She takes tapazole as rx'ed.  pt states she feels well in general.    She gets B-12 shots 1/month, as rx'ed.   Past Medical History:  Diagnosis Date  . Asthma    mild by PFT's 03/16/1998  . Cervical lymphadenopathy    biopsy negativeMarvetta Gibbons)  . Chronic hypertension   . Complete spontaneous abortion    x2  . GERD (gastroesophageal reflux disease)   . History of abnormal Pap smear    s/p cryo( Dr. Wiliam Ke)  . History of kidney stones    passed stones  . HSV (herpes simplex virus) anogenital infection   . Hypertension   . Hypothyroidism   . Normal vaginal delivery    x3  . Rheumatoid arthritis (HCC)   . Thyroid disease affecting pregnancy   . 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   . Vaginal Pap smear, abnormal   . Vitamin B12 deficiency     Past Surgical History:  Procedure Laterality Date  . biopsy of lymph node in R neck  2007.  Marland Kitchen LAPAROSCOPIC APPENDECTOMY N/A 05/08/2019   Procedure: APPENDECTOMY LAPAROSCOPIC WITH DIAGNOSTIC LAPAROSCOPY;  Surgeon: [name], MD;  Location: [facility];  Service: General;  Laterality: N/A;  . LEEP    . TONSILLECTOMY Bilateral 04/26/2020   Procedure: TONSILLECTOMY;  Surgeon: Laren Boom, DO;  Location: [facility];  Service: ENT;  Laterality: Bilateral;  . WISDOM TOOTH EXTRACTION      Social History   Socioeconomic History  . Marital status: Single    Spouse name: Not on file  . Number of children: Not on file   . Years of education: Not on file  . Highest education level: Not on file  Occupational History  . Not on file  Tobacco Use  . Smoking status: Never Smoker  . Smokeless tobacco: Never Used  Vaping Use  . Vaping Use: Never used  Substance and Sexual Activity  . Alcohol use: Yes    Alcohol/week: 0.0 standard drinks    Comment: occasional beer/wine  . Drug use: No  . Sexual activity: Yes    Partners: Male    Birth control/protection: Pill  Other Topics Concern  . Not on file  Social History Narrative  . Not on file   Social Determinants of Health   Financial Resource Strain: Not on file  Food Insecurity: Not on file  Transportation Needs: Not on file  Physical Activity: Not on file  Stress: Not on file  Social Connections: Not on file  Intimate Partner Violence: Not on file    Current Outpatient Medications on File Prior to Visit  Medication Sig Dispense Refill  . acetaminophen (TYLENOL) 500 MG tablet Take 1 tablet (500 mg total) by mouth every 8 (eight) hours as needed for mild pain, moderate pain or headache. 30 tablet 0  . albuterol (VENTOLIN HFA) 108 (90 Base) MCG/ACT inhaler Inhale 1-2 puffs into the lungs every 6 (six)  hours as needed for wheezing or shortness of breath.    Marland Kitchen amLODipine (NORVASC) 10 MG tablet TAKE 1 TABLET(10 MG) BY MOUTH DAILY (Patient taking differently: Take 10 mg by mouth at bedtime.) 90 tablet 1  . calcium carbonate (TUMS - DOSED IN MG ELEMENTAL CALCIUM) 500 MG chewable tablet Chew 1-2 tablets by mouth 3 (three) times daily as needed for indigestion or heartburn.    . clobetasol cream (TEMOVATE) 0.05 % Apply 1 application topically 2 (two) times daily as needed.    . cyanocobalamin (,VITAMIN B-12,) 1000 MCG/ML injection ADMINISTER 1 ML(1000 MCG) IN THE MUSCLE EVERY 30 DAYS (Patient taking differently: Inject 1,000 mcg into the muscle every 30 (thirty) days.) 1 mL 3  . Dextromethorphan-guaiFENesin (CORICIDIN HBP CONGESTION/COUGH) 10-200 MG CAPS Take  1-2 tablets by mouth 3 (three) times daily as needed (cough/cold).    . folic acid (FOLVITE) 1 MG tablet Take 1 mg by mouth at bedtime.    Marland Kitchen HUMIRA PEN 40 MG/0.8ML PNKT Inject 40 mg into the skin every Friday.    . mesalamine (CANASA) 1000 MG suppository Place 1,000 mg rectally at bedtime as needed (ulcerative proctitis).    . methimazole (TAPAZOLE) 5 MG tablet TAKE 1 TABLET(5 MG) BY MOUTH DAILY 90 tablet 0  . methotrexate 2.5 MG tablet Take 10 mg by mouth every Friday.    . Multiple Vitamin (MULTIVITAMIN WITH MINERALS) TABS tablet Take 1 tablet by mouth daily.    Marland Kitchen NORTREL 1/35, 28, tablet TAKE 1 TABLET BY MOUTH DAILY (Patient taking differently: Take 1 tablet by mouth at bedtime.) 84 tablet 4  . valACYclovir (VALTREX) 500 MG tablet Take 1 tablet bid x 3 days prn outbreaks. (Patient taking differently: Take 500 mg by mouth See admin instructions. Take 1 tablet (500 mg) by mouth twice daily for 3 days as needed for breakouts) 30 tablet 11   No current facility-administered medications on file prior to visit.    Allergies  Allergen Reactions  . Ciprofloxacin Anaphylaxis and Swelling  . Clindamycin/Lincomycin Anaphylaxis and Swelling  . Sulfamethoxazole-Trimethoprim Swelling    Swelling is of the lips  . Amoxicillin Rash    Has patient had a PCN reaction causing immediate rash, facial/tongue/throat swelling, SOB or lightheadedness with hypotension: no Has patient had a PCN reaction causing severe rash involving mucus membranes or skin necrosis: pt had rash -- non severe  Has patient had a PCN reaction that required hospitalization: no Has patient had a PCN reaction occurring within the last 10 years: no If all of the above answers are "NO", then may proceed with Cephalosporin use.     Family History  Problem Relation Age of Onset  . Ulcerative colitis Mother   . Hypertension Mother   . Diabetes Father   . Heart attack Father   . Hypertension Father   . Diabetes Sister   . Thyroid  disease Sister   . Heart attack Maternal Grandmother   . Stroke Maternal Grandmother   . Breast cancer Neg Hx     BP 120/80 (BP Location: Right Arm, Patient Position: Sitting, Cuff Size: Large)   Pulse 84   Ht 5\' 4"  (1.626 m)   Wt 183 lb 12.8 oz (83.4 kg)   SpO2 98%   BMI 31.55 kg/m   Review of Systems Denies fever.      Objective:   Physical Exam VITAL SIGNS:  See vs page GENERAL: no distress NECK: There is no palpable thyroid enlargement.  No thyroid nodule is palpable.  No palpable lymphadenopathy at the anterior neck.     Lab Results  Component Value Date   TSH 0.60 07/26/2020   T4TOTAL 12.3 (H) 10/06/2016       Assessment & Plan:  Hyperthyroidism: well-controlled.  Please continue the same tapazole B-12 def: recheck today

## 2020-09-10 ENCOUNTER — Encounter: Payer: Self-pay | Admitting: *Deleted

## 2020-09-12 ENCOUNTER — Other Ambulatory Visit: Payer: Self-pay | Admitting: *Deleted

## 2020-09-12 MED ORDER — NITROFURANTOIN MONOHYD MACRO 100 MG PO CAPS: 100.0000 mg | ORAL_CAPSULE | Freq: Two times a day (BID) | ORAL | 0 refills | Status: AC

## 2020-09-12 NOTE — Progress Notes (Signed)
Pt called to office with symptoms of UTI  Per Dr Clearance Coots, may treat today with Macrobid.  Rx sent.

## 2020-09-20 DIAGNOSIS — K5130 Ulcerative (chronic) rectosigmoiditis without complications: Secondary | ICD-10-CM | POA: Diagnosis not present

## 2020-09-23 DIAGNOSIS — Z882 Allergy status to sulfonamides status: Secondary | ICD-10-CM | POA: Diagnosis not present

## 2020-09-23 DIAGNOSIS — K523 Indeterminate colitis: Secondary | ICD-10-CM | POA: Diagnosis not present

## 2020-09-23 DIAGNOSIS — M129 Arthropathy, unspecified: Secondary | ICD-10-CM | POA: Diagnosis not present

## 2020-09-23 DIAGNOSIS — Z880 Allergy status to penicillin: Secondary | ICD-10-CM | POA: Diagnosis not present

## 2020-09-23 DIAGNOSIS — Z79899 Other long term (current) drug therapy: Secondary | ICD-10-CM | POA: Diagnosis not present

## 2020-09-23 DIAGNOSIS — H209 Unspecified iridocyclitis: Secondary | ICD-10-CM | POA: Diagnosis not present

## 2020-09-23 DIAGNOSIS — M064 Inflammatory polyarthropathy: Secondary | ICD-10-CM | POA: Diagnosis not present

## 2020-09-23 DIAGNOSIS — E538 Deficiency of other specified B group vitamins: Secondary | ICD-10-CM | POA: Diagnosis not present

## 2020-09-23 DIAGNOSIS — K5120 Ulcerative (chronic) proctitis without complications: Secondary | ICD-10-CM | POA: Diagnosis not present

## 2020-09-23 DIAGNOSIS — K529 Noninfective gastroenteritis and colitis, unspecified: Secondary | ICD-10-CM | POA: Diagnosis not present

## 2020-09-23 DIAGNOSIS — Z881 Allergy status to other antibiotic agents status: Secondary | ICD-10-CM | POA: Diagnosis not present

## 2020-09-27 DIAGNOSIS — K5120 Ulcerative (chronic) proctitis without complications: Secondary | ICD-10-CM | POA: Diagnosis not present

## 2020-09-27 DIAGNOSIS — K6289 Other specified diseases of anus and rectum: Secondary | ICD-10-CM | POA: Diagnosis not present

## 2020-09-27 DIAGNOSIS — K5289 Other specified noninfective gastroenteritis and colitis: Secondary | ICD-10-CM | POA: Diagnosis not present

## 2020-11-08 ENCOUNTER — Ambulatory Visit: Payer: Medicaid Other | Admitting: Obstetrics

## 2020-11-22 DIAGNOSIS — K5120 Ulcerative (chronic) proctitis without complications: Secondary | ICD-10-CM | POA: Diagnosis not present

## 2020-12-03 ENCOUNTER — Other Ambulatory Visit: Payer: Self-pay

## 2020-12-03 ENCOUNTER — Other Ambulatory Visit (HOSPITAL_COMMUNITY)
Admission: RE | Admit: 2020-12-03 | Discharge: 2020-12-03 | Disposition: A | Discharge status: Home / Self Care | Payer: Medicaid Other | Source: Ambulatory Visit | Attending: Obstetrics | Admitting: Obstetrics

## 2020-12-03 ENCOUNTER — Encounter: Payer: Self-pay | Admitting: Obstetrics

## 2020-12-03 ENCOUNTER — Ambulatory Visit (INDEPENDENT_AMBULATORY_CARE_PROVIDER_SITE_OTHER): Payer: Medicaid Other | Admitting: Obstetrics

## 2020-12-03 VITALS — BP 131/86 | HR 81 | Ht 64.0 in | Wt 187.0 lb

## 2020-12-03 DIAGNOSIS — Z23 Encounter for immunization: Secondary | ICD-10-CM

## 2020-12-03 DIAGNOSIS — Z01419 Encounter for gynecological examination (general) (routine) without abnormal findings: Secondary | ICD-10-CM | POA: Diagnosis not present

## 2020-12-03 DIAGNOSIS — N393 Stress incontinence (female) (male): Secondary | ICD-10-CM | POA: Diagnosis not present

## 2020-12-03 DIAGNOSIS — Z0000 Encounter for general adult medical examination without abnormal findings: Secondary | ICD-10-CM

## 2020-12-03 DIAGNOSIS — E669 Obesity, unspecified: Secondary | ICD-10-CM

## 2020-12-03 DIAGNOSIS — N898 Other specified noninflammatory disorders of vagina: Secondary | ICD-10-CM | POA: Diagnosis not present

## 2020-12-03 DIAGNOSIS — N939 Abnormal uterine and vaginal bleeding, unspecified: Secondary | ICD-10-CM

## 2020-12-03 DIAGNOSIS — Z113 Encounter for screening for infections with a predominantly sexual mode of transmission: Secondary | ICD-10-CM | POA: Diagnosis not present

## 2020-12-03 MED ORDER — NORTREL 1/35 (28) 1-35 MG-MCG PO TABS: 1.0000 | ORAL_TABLET | Freq: Every day | ORAL | 11 refills | Status: DC

## 2020-12-03 NOTE — Addendum Note (Signed)
Addended by: Brock Bad on: 12/03/2020 04:53 PM   Modules accepted: Orders

## 2020-12-03 NOTE — Progress Notes (Addendum)
Subjective:        Sue [name] is a 45 y.o. female here for a routine exam.  Current complaints: Vaginal discharge and occasional boils in groin area.   Complains of leaking of urine with cough, sneeze, laughing, etc that has gotten worse over the past year.  Also has decreased libido, unsure of the etiology such as medication or aging.  Requests a natural remedy for the decreased sex drive.  Personal health questionnaire:  Is patient [name], have a family history of breast and/or ovarian cancer: no Is there a family history of uterine cancer diagnosed at age < 55, gastrointestinal cancer, urinary tract cancer, family member who is a Personnel officer syndrome-associated carrier: no Is the patient overweight and hypertensive, family history of diabetes, personal history of gestational diabetes, preeclampsia or PCOS: no Is patient over 63, have PCOS,  family history of premature CHD under age 6, diabetes, smoke, have hypertension or peripheral artery disease:  no At any time, has a partner hit, kicked or otherwise hurt or frightened you?: no Over the past 2 weeks, have you felt down, depressed or hopeless?: no Over the past 2 weeks, have you felt little interest or pleasure in doing things?:no   Gynecologic History Patient's last menstrual period was 11/11/2020 (approximate). Contraception: OCP (estrogen/progesterone) Last Pap: 11-03-2019. Results were: normal Last mammogram: 01-25-2020. Results were: normal  Obstetric History OB History  Gravida Para Term Preterm AB Living  7 4 3 1 3 4   SAB IAB Ectopic Multiple Live Births  3     0 4    # Outcome Date GA Lbr Len/2nd Weight Sex Delivery Anes PTL Lv  7 Term 03/30/17 [redacted]w[redacted]d 04:02 / 00:08 7 lb 4 oz (3.289 kg) F Vag-Spont EPI  LIV  6 Term 07/06/05 [redacted]w[redacted]d  6 lb 12 oz (3.062 kg) M Vag-Spont None  LIV  5 SAB 2006 [redacted]w[redacted]d       DEC  4 Term 03/27/96 [redacted]w[redacted]d  7 lb 12 oz (3.515 kg) M Vag-Spont None  LIV  3 SAB 1996        DEC  2 Preterm 02/17/92  [redacted]w[redacted]d  3 lb 11 oz (1.673 kg) M Vag-Spont None  LIV     Birth Comments: pre eclampsia  1 SAB 1992        DEC    Past Medical History:  Diagnosis Date   Asthma    mild by PFT's 03/16/1998   Cervical lymphadenopathy    biopsy negative( [facility])   Chronic hypertension    Complete spontaneous abortion    x2   GERD (gastroesophageal reflux disease)    History of abnormal Pap smear    s/p cryo( Dr. 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    Vaginal Pap smear, abnormal    Vitamin B12 deficiency     Past Surgical History:  Procedure Laterality Date   biopsy of lymph node in R neck  2007.   LAPAROSCOPIC APPENDECTOMY N/A 05/08/2019   Procedure: APPENDECTOMY LAPAROSCOPIC WITH DIAGNOSTIC LAPAROSCOPY;  Surgeon: 07/08/2019, MD;  Location: [facility];  Service: General;  Laterality: N/A;   LEEP     TONSILLECTOMY Bilateral 04/26/2020   Procedure: TONSILLECTOMY;  Surgeon: Skotnicki, Meghan A, DO;  Location: [facility];  Service: ENT;  Laterality: Bilateral;   WISDOM TOOTH EXTRACTION       Current Outpatient Medications:    acetaminophen (TYLENOL) 500 MG tablet, Take 1 tablet (500 mg total) by mouth every 8 (eight) hours as needed for mild pain, moderate pain or headache., Disp: 30 tablet, Rfl: 0   albuterol (VENTOLIN HFA) 108 (90 Base) MCG/ACT inhaler, Inhale 1-2 puffs into the lungs every 6 (six) hours as needed for wheezing or shortness of breath., Disp: , Rfl:    amLODipine (NORVASC) 10 MG tablet, TAKE 1 TABLET(10 MG) BY MOUTH DAILY (Patient taking differently: Take 10 mg by mouth at bedtime.), Disp: 90  tablet, Rfl: 1   calcium carbonate (TUMS - DOSED IN MG ELEMENTAL CALCIUM) 500 MG chewable tablet, Chew 1-2 tablets by mouth 3 (three) times daily as needed for indigestion or heartburn., Disp: , Rfl:    cyanocobalamin (,VITAMIN B-12,) 1000 MCG/ML injection, ADMINISTER 1 ML(1000 MCG) IN THE MUSCLE EVERY 30 DAYS (Patient taking differently: Inject 1,000 mcg into the muscle every 30 (thirty) days.), Disp: 1 mL, Rfl: 3   Dextromethorphan-guaiFENesin (CORICIDIN HBP CONGESTION/COUGH) 10-200 MG CAPS, Take 1-2 tablets by mouth 3 (three) times daily as needed (cough/cold)., Disp: , Rfl:    inFLIXimab (REMICADE IV), Inject into the vein., Disp: , Rfl:    mesalamine (CANASA) 1000 MG suppository, Place 1,000 mg rectally at bedtime as needed (ulcerative proctitis)., Disp: , Rfl:    methimazole (TAPAZOLE) 5 MG tablet, TAKE 1 TABLET(5 MG) BY MOUTH DAILY, Disp: 90 tablet, Rfl: 0   Multiple Vitamin (MULTIVITAMIN WITH MINERALS) TABS tablet, Take 1 tablet by mouth daily., Disp: , Rfl:    valACYclovir (VALTREX) 500 MG tablet, Take 1 tablet bid x 3 days prn outbreaks. (Patient taking differently: Take 500 mg by mouth See admin instructions. Take 1 tablet (500 mg) by mouth twice daily for 3 days as needed for breakouts), Disp: 30 tablet, Rfl: 11   clobetasol cream (TEMOVATE) 0.05 %, Apply 1 application topically 2 (two) times daily as needed., Disp: , Rfl:    folic acid (FOLVITE) 1 MG tablet, Take 1 mg by mouth at bedtime., Disp: , Rfl:    HUMIRA PEN 40 MG/0.8ML PNKT, Inject 40 mg into the skin every Friday., Disp: , Rfl:    methotrexate 2.5 MG tablet, Take 10 mg by mouth every Friday., Disp: , Rfl:    norethindrone-ethinyl estradiol 1/35 (NORTREL 1/35, 28,) tablet, Take 1 tablet by mouth at bedtime., Disp: 28 tablet, Rfl: 11 Allergies  Allergen Reactions   Ciprofloxacin Anaphylaxis and Swelling   Clindamycin/Lincomycin Anaphylaxis and Swelling   Sulfamethoxazole-Trimethoprim Swelling    Swelling is of the lips    Amoxicillin Rash    Has patient had a PCN reaction causing immediate rash, facial/tongue/throat swelling, SOB or lightheadedness with hypotension: no Has patient had a PCN reaction causing severe rash involving mucus membranes or skin necrosis: pt had rash -- non severe  Has patient had a PCN reaction that required hospitalization: no Has patient had a PCN reaction occurring within the last 10 years: no If all of the above answers are "NO", then may proceed with Cephalosporin use.     Social History   Tobacco Use   Smoking status: Never   Smokeless tobacco: Never  Substance Use Topics   Alcohol  use: Yes    Alcohol/week: 0.0 standard drinks    Comment: occasional beer/wine    Family History  Problem Relation Age of Onset   Ulcerative colitis Mother    Hypertension Mother    Diabetes Father    Heart attack Father    Hypertension Father    Diabetes Sister    Thyroid disease Sister    Heart attack Maternal Grandmother    Stroke Maternal Grandmother    Breast cancer Neg Hx       Review of Systems  Constitutional: negative for fatigue and weight loss Respiratory: negative for cough and wheezing Cardiovascular: negative for chest pain, fatigue and palpitations Gastrointestinal: negative for abdominal pain and change in bowel habits Musculoskeletal:negative for myalgias Neurological: negative for gait problems and tremors Behavioral/Psych: negative for abusive relationship, depression Endocrine: negative for temperature intolerance    Genitourinary:positive for vaginal discharge and urinary incontinence.  Decreased libido.  negative for abnormal menstrual periods, genital lesions, hot flashes Integument/breast: positive for recurrent boils in groin.  negative for breast lump, breast tenderness, nipple discharge and skin lesion(s)    Objective:       BP 131/86   Pulse 81   Ht 5\' 4"  (1.626 m)   Wt 187 lb (84.8 kg)   LMP 11/11/2020 (Approximate)   BMI 32.10 kg/m   General:   Alert and no distress  Skin:   no rash or abnormalities  Lungs:   clear to auscultation bilaterally  Heart:   regular rate and rhythm, S1, S2 normal, no murmur, click, rub or gallop  Breasts:   normal without suspicious masses, skin or nipple changes or axillary nodes  Abdomen:  normal findings: no organomegaly, soft, non-tender and no hernia  Pelvis:  External genitalia: normal general appearance Urinary system: urethral meatus normal and bladder without fullness, nontender Vaginal: normal without tenderness, induration or masses Cervix: normal appearance Adnexa: normal bimanual exam Uterus: anteverted and non-tender, normal size   Lab Review Urine pregnancy test Labs reviewed yes Radiologic studies reviewed yes  I have spent a total of 20 minutes of face-to-face time, excluding clinical staff time, reviewing notes and preparing to see patient, ordering tests and/or medications, and counseling the patient.   Assessment:    1. Encounter for gynecological examination with Papanicolaou smear of cervix Rx: - Cytology - PAP( [place])  2. Vaginal discharge Rx: - Cervicovaginal ancillary only( [place])  3. Screening for STD (sexually transmitted disease) Rx: - HIV Antibody (routine testing w rflx) - Hepatitis B surface antigen - RPR - Hepatitis C antibody  4. Routine adult health maintenance Rx: - Flu Vaccine QUAD 36+ mos IM (Fluarix, Quad PF)  5. Abnormal uterine bleeding (AUB) Rx: - norethindrone-ethinyl estradiol 1/35 (NORTREL 1/35, 28,) tablet; Take 1 tablet by mouth at bedtime.  Dispense: 28 tablet; Refill: 11  6. Obesity (BMI 30.0-34.9) - weight reduction with the aid of dietary changes, exercise and behavioral modification recommended   7. SUI (stress urinary incontinence, female) Rx: - Ambulatory referral to Urogynecology    8. Decreased libido - natural remedies preferred by patient and she will get a consultation at [facility]    Plan:    Education reviewed: calcium supplements, depression evaluation, low fat, low cholesterol diet, safe sex/STD prevention, self breast exams, and weight bearing exercise. Contraception: OCP (estrogen/progesterone). Follow up in: 1 year.   Meds ordered this encounter  Medications   norethindrone-ethinyl estradiol 1/35 (NORTREL 1/35, 28,) tablet    Sig: Take  1 tablet by mouth at bedtime.    Dispense:  28 tablet    Refill:  11   Orders Placed This Encounter  Procedures   Flu Vaccine QUAD 36+ mos IM (Fluarix, Quad PF)   HIV Antibody (routine testing w rflx)   Hepatitis B surface antigen   RPR   Hepatitis C antibody   Ambulatory referral to Urogynecology    Referral Priority:   Routine    Referral Type:   Consultation    Referral Reason:   Specialty Services Required    Requested Specialty:   Urology    Number of Visits Requested:   1      Brock Bad, MD 12/03/2020 4:42 PM

## 2020-12-04 LAB — CERVICOVAGINAL ANCILLARY ONLY
Bacterial Vaginitis (gardnerella): NEGATIVE
Candida Glabrata: NEGATIVE
Candida Vaginitis: NEGATIVE
Chlamydia: NEGATIVE
Comment: NEGATIVE
Comment: NEGATIVE
Comment: NEGATIVE
Comment: NEGATIVE
Comment: NEGATIVE
Comment: NORMAL
Neisseria Gonorrhea: NEGATIVE
Trichomonas: NEGATIVE

## 2020-12-04 LAB — HEPATITIS B SURFACE ANTIGEN: Hepatitis B Surface Ag: NEGATIVE

## 2020-12-04 LAB — HEPATITIS C ANTIBODY: Hep C Virus Ab: <0.1 s/co ratio (ref 0.0–0.9)

## 2020-12-04 LAB — RPR: RPR Ser Ql: NONREACTIVE

## 2020-12-04 LAB — HIV ANTIBODY (ROUTINE TESTING W REFLEX): HIV Screen 4th Generation wRfx: NONREACTIVE

## 2020-12-08 LAB — CYTOLOGY - PAP
Comment: NEGATIVE
Diagnosis: NEGATIVE
Diagnosis: REACTIVE
High risk HPV: NEGATIVE

## 2020-12-10 ENCOUNTER — Ambulatory Visit: Payer: Medicaid Other | Admitting: Internal Medicine

## 2020-12-10 ENCOUNTER — Other Ambulatory Visit: Payer: Self-pay

## 2020-12-10 ENCOUNTER — Ambulatory Visit (HOSPITAL_COMMUNITY)
Admission: RE | Admit: 2020-12-10 | Discharge: 2020-12-10 | Disposition: A | Discharge status: Home / Self Care | Payer: Medicaid Other | Source: Ambulatory Visit | Attending: Internal Medicine | Admitting: Internal Medicine

## 2020-12-10 ENCOUNTER — Encounter: Payer: Self-pay | Admitting: Internal Medicine

## 2020-12-10 VITALS — BP 131/83 | HR 79 | Temp 98.4°F | Resp 28 | Ht 64.0 in | Wt 189.0 lb

## 2020-12-10 DIAGNOSIS — R059 Cough, unspecified: Secondary | ICD-10-CM | POA: Diagnosis not present

## 2020-12-10 DIAGNOSIS — E538 Deficiency of other specified B group vitamins: Secondary | ICD-10-CM

## 2020-12-10 DIAGNOSIS — R051 Acute cough: Secondary | ICD-10-CM | POA: Diagnosis not present

## 2020-12-10 DIAGNOSIS — R079 Chest pain, unspecified: Secondary | ICD-10-CM | POA: Diagnosis not present

## 2020-12-10 DIAGNOSIS — I10 Essential (primary) hypertension: Secondary | ICD-10-CM

## 2020-12-10 LAB — CBC WITH DIFFERENTIAL/PLATELET
Abs Immature Granulocytes: 0 10*3/uL (ref 0.00–0.07)
Basophils Absolute: 0.1 10*3/uL (ref 0.0–0.1)
Basophils Relative: 1 %
Eosinophils Absolute: 0.4 10*3/uL (ref 0.0–0.5)
Eosinophils Relative: 6 %
HCT: 40.3 % (ref 36.0–46.0)
Hemoglobin: 13.6 g/dL (ref 12.0–15.0)
Lymphocytes Relative: 49 %
Lymphs Abs: 3.6 10*3/uL (ref 0.7–4.0)
MCH: 30 pg (ref 26.0–34.0)
MCHC: 33.7 g/dL (ref 30.0–36.0)
MCV: 89 fL (ref 80.0–100.0)
Monocytes Absolute: 0.8 10*3/uL (ref 0.1–1.0)
Monocytes Relative: 11 %
Neutro Abs: 2.4 10*3/uL (ref 1.7–7.7)
Neutrophils Relative %: 33 %
Platelets: 325 10*3/uL (ref 150–400)
RBC: 4.53 MIL/uL (ref 3.87–5.11)
RDW: 14.5 % (ref 11.5–15.5)
WBC: 7.3 10*3/uL (ref 4.0–10.5)
nRBC: 0 % (ref 0.0–0.2)
nRBC: 0 /100 WBC

## 2020-12-10 LAB — PROCALCITONIN: Procalcitonin: <0.1 ng/mL

## 2020-12-10 LAB — RESP PANEL BY RT-PCR (RSV, FLU A&B, COVID)  RVPGX2
Influenza A by PCR: NEGATIVE
Influenza B by PCR: NEGATIVE
Resp Syncytial Virus by PCR: NEGATIVE
SARS Coronavirus 2 by RT PCR: NEGATIVE

## 2020-12-10 LAB — GLUCOSE, CAPILLARY: Glucose-Capillary: 106 mg/dL — ABNORMAL HIGH (ref 70–99)

## 2020-12-10 MED ORDER — CYANOCOBALAMIN 1000 MCG/ML IJ SOLN: 1000.0000 ug | INTRAMUSCULAR | 3 refills | Status: DC

## 2020-12-10 MED ORDER — ALBUTEROL SULFATE HFA 108 (90 BASE) MCG/ACT IN AERS: 1.0000 | INHALATION_SPRAY | Freq: Four times a day (QID) | RESPIRATORY_TRACT | 3 refills | Status: AC | PRN

## 2020-12-10 NOTE — Progress Notes (Addendum)
Acute Office Visit  Subjective:    Patient ID: Sue [name], female    DOB: 10/17/1975, 45 y.o.   MRN: [id number]  Chief Complaint  Patient presents with   Cough    Wants Glucose and B 12 checked shortness of breath . Thick white mucous .  No fevers     HPI Patient presents for 2 weeks of worsening cough. She states that her 41 yo son and 28 yo daughter got sick in early September and her daughter was diagnosed with RSV. She states that she developed a cough and runny nose around September 19. The runny nose lasted about a week. She thought she was improving but then began coughing up a thick, white substance last Fri/Sat which is different from before. She also developed chest pain while taking a deep breath in on Sunday. She states the pain radiates to her back. She also states she felt lightheaded at work yesterday and has been feeling shaky. She denies fever or muscle aches. She received her flu shot last Tuesday. She also has received Remicaid infusion for her ulcerative colitis, most recently on 9/30.  She also requests refills for her Vitamin B12 monthly injection and albuterol and to check her glucose today.  Please see problem-based charting for further details of this encounter and assessment and plan.   Past Medical History:  Diagnosis Date   Asthma    mild by PFT's 03/16/1998   Cervical lymphadenopathy    biopsy negative( [facility]. [facility])   Chronic hypertension    Complete spontaneous abortion    x2   GERD (gastroesophageal reflux disease)    History of abnormal Pap smear    s/p cryo( Dr. 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    Vaginal Pap smear, abnormal    Vitamin B12 deficiency     Past Surgical History:  Procedure Laterality Date   biopsy of lymph node in R neck  2007.   LAPAROSCOPIC APPENDECTOMY N/A 05/08/2019   Procedure: APPENDECTOMY LAPAROSCOPIC WITH DIAGNOSTIC LAPAROSCOPY;  Surgeon: [name], MD;  Location: [facility];  Service: General;  Laterality: N/A;   LEEP     TONSILLECTOMY Bilateral 04/26/2020   Procedure: TONSILLECTOMY;  Surgeon: Laren Boom, DO;  Location: [facility];  Service: ENT;  Laterality: Bilateral;   WISDOM TOOTH EXTRACTION      Family History  Problem Relation Age of Onset   Ulcerative colitis Mother    Hypertension Mother    Diabetes Father    Heart attack Father    Hypertension Father    Diabetes Sister    Thyroid disease Sister    Heart attack Maternal Grandmother    Stroke Maternal Grandmother    Breast cancer Neg Hx     Outpatient Medications Prior to Visit  Medication Sig Dispense Refill   acetaminophen (TYLENOL) 500 MG tablet Take 1 tablet (500 mg total) by mouth every 8 (eight) hours as needed for mild pain, moderate pain or headache. 30 tablet 0   amLODipine (NORVASC) 10 MG tablet TAKE 1 TABLET(10 MG) BY  MOUTH DAILY (Patient taking differently: Take 10 mg by mouth at bedtime.) 90 tablet 1   calcium carbonate (TUMS - DOSED IN MG ELEMENTAL CALCIUM) 500 MG chewable tablet Chew 1-2 tablets by mouth 3 (three) times daily as needed for indigestion or heartburn.     clobetasol cream (TEMOVATE) 0.05 % Apply 1 application topically 2 (two) times daily as needed.     Dextromethorphan-guaiFENesin (CORICIDIN HBP CONGESTION/COUGH) 10-200 MG CAPS Take 1-2 tablets by mouth 3 (three) times daily as needed (cough/cold).     folic acid (FOLVITE) 1 MG tablet Take 1 mg by mouth at bedtime.     HUMIRA PEN 40 MG/0.8ML PNKT Inject 40 mg into the skin every Friday.     inFLIXimab (REMICADE IV) Inject into the vein.     mesalamine  (CANASA) 1000 MG suppository Place 1,000 mg rectally at bedtime as needed (ulcerative proctitis).     methimazole (TAPAZOLE) 5 MG tablet TAKE 1 TABLET(5 MG) BY MOUTH DAILY 90 tablet 0   methotrexate 2.5 MG tablet Take 10 mg by mouth every Friday.     Multiple Vitamin (MULTIVITAMIN WITH MINERALS) TABS tablet Take 1 tablet by mouth daily.     norethindrone-ethinyl estradiol 1/35 (NORTREL 1/35, 28,) tablet Take 1 tablet by mouth at bedtime. 28 tablet 11   valACYclovir (VALTREX) 500 MG tablet Take 1 tablet bid x 3 days prn outbreaks. (Patient taking differently: Take 500 mg by mouth See admin instructions. Take 1 tablet (500 mg) by mouth twice daily for 3 days as needed for breakouts) 30 tablet 11   albuterol (VENTOLIN HFA) 108 (90 Base) MCG/ACT inhaler Inhale 1-2 puffs into the lungs every 6 (six) hours as needed for wheezing or shortness of breath.     cyanocobalamin (,VITAMIN B-12,) 1000 MCG/ML injection ADMINISTER 1 ML(1000 MCG) IN THE MUSCLE EVERY 30 DAYS (Patient taking differently: Inject 1,000 mcg into the muscle every 30 (thirty) days.) 1 mL 3   No facility-administered medications prior to visit.    Allergies  Allergen Reactions   Ciprofloxacin Anaphylaxis and Swelling   Clindamycin/Lincomycin Anaphylaxis and Swelling   Sulfamethoxazole-Trimethoprim Swelling    Swelling is of the lips   Amoxicillin Rash    Has patient had a PCN reaction causing immediate rash, facial/tongue/throat swelling, SOB or lightheadedness with hypotension: no Has patient had a PCN reaction causing severe rash involving mucus membranes or skin necrosis: pt had rash -- non severe  Has patient had a PCN reaction that required hospitalization: no Has patient had a PCN reaction occurring within the last 10 years: no If all of the above answers are "NO", then may proceed with Cephalosporin use.        Objective:    Physical Exam  BP 131/83 (BP Location: Left Arm, Patient Position: Sitting, Cuff Size: Large)    Pulse 79   Temp 98.4 F (36.9 C) (Oral)   Resp (!) 28   Ht 5\' 4"  (1.626 m)   Wt 189 lb (85.7 kg)   LMP 11/11/2020 (Approximate)   SpO2 100% Comment: room air  BMI 32.44 kg/m  Wt Readings from Last 3 Encounters:  12/10/20 189 lb (85.7 kg)  12/03/20 187 lb (84.8 kg)  07/26/20 183 lb 12.8 oz (83.4 kg)   Gen: Appears fatigued, although in NAD  HEENT: Erythematous nasal turbinates. No exudate or erythema seen on throat.  CV: RRR, no murmurs heard Pulm: Slightly diminished breath sounds over right lower lobe. No wheezes or crackles heard.   Assessment & Plan:  Please see problem-based charting for assessment and plan.    Problem List Items Addressed This Visit       Other   Cough - Primary    Patient reports 2 weeks of cough which seems to have worsened over the past 3-4 days. She is now experiencing pleuritic chest pain and coughing up thick, white sputum. She also states she has been feeling dizzy and shaky She has tried OTC coricidin, mucinex, and vick's vapor rub which have not helped.   We checked orthostatic vitals to evaluate for dehydration and they were normal. Today she is afebrile with normal HR, although she is tachypneic. On exam, lung sounds were slightly diminished in right lower lobe. No wheezes or crackles heard. Given patient's immunosuppressed state, pneumonia is a concern. Will order CBC to assess for leukocytosis and procalcitonin to assess for bacterial infection. Will also order respiratory pathogen panel and CXR and start course of antibiotics if CXR shows consolidation. If CXR is negative, will continue supportive care.   Plan: -CBC, procalcitonin, RPP results pending  -CXR pending       Relevant Orders   DG Chest 2 View   Resp panel by RT-PCR (RSV, Flu A&B, Covid) Nasopharyngeal Swab   Procalcitonin   CBC with Diff   Vitamin B12 deficiency    Vitamin B12 level from 7/22 was 524. Discussed with patient that we should check B12 every 6 months instead of  sooner. She currently administers monthly IM injections of B12. She denies symptoms of B12 deficiency including fatigue, ataxia, or paresthesias.   Plan:  -Refills sent for B12      Relevant Medications   cyanocobalamin (,VITAMIN B-12,) 1000 MCG/ML injection     Meds ordered this encounter  Medications   albuterol (VENTOLIN HFA) 108 (90 Base) MCG/ACT inhaler    Sig: Inhale 1-2 puffs into the lungs every 6 (six) hours as needed for wheezing or shortness of breath.    Dispense:  8 g    Refill:  3   cyanocobalamin (,VITAMIN B-12,) 1000 MCG/ML injection    Sig: Inject 1 mL (1,000 mcg total) into the muscle every 30 (thirty) days.    Dispense:  10 mL    Refill:  3     Orland Dec, Medical Student     Attestation for Student Documentation:  I personally was present and performed or re-performed the history, physical exam and medical decision-making activities of this service and have verified that the service and findings are accurately documented in the student's note.  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 Initially presented as viral URI symptoms; however, over the past several days has become productive of white sputum. Associated with pleuritic chest pain radiating to her left side and back. No fevers/chills. On exam, patient is mildly tachypneic with diminished breath sonuds at the lower lung base. Given concern for possible pneumonia, CXR, CBC and procalcitonin ordered. CBC and procalcitonin wnl. COVID, Influenza A/B and RSV negative. CXR is pending.  Differential also includes pulmonary embolism. If work up negative, and patient is having persistent symptoms, will follow up with CTA PE.   Patient has a history of vitamin B12 deficiency and injects monthly. Vitamin B12 wnl at last visit. Vitamin B12 injections refilled.    Patient  discussed with Dr. Orma Render, MD IMTS PGY-3 12/11/2020, 10:24 AM

## 2020-12-10 NOTE — Assessment & Plan Note (Signed)
Vitamin B12 level from 7/22 was 524. Discussed with patient that we should check B12 every 6 months instead of sooner. She currently administers monthly IM injections of B12. She denies symptoms of B12 deficiency including fatigue, ataxia, or paresthesias.   Plan:  -Refills sent for B12

## 2020-12-10 NOTE — Assessment & Plan Note (Addendum)
Patient reports 2 weeks of cough which seems to have worsened over the past 3-4 days. She is now experiencing pleuritic chest pain and coughing up thick, white sputum. She also states she has been feeling dizzy and shaky She has tried OTC coricidin, mucinex, and vick's vapor rub which have not helped.   We checked orthostatic vitals to evaluate for dehydration and they were normal. Today she is afebrile with normal HR, although she is tachypneic. On exam, lung sounds were slightly diminished in right lower lobe. No wheezes or crackles heard. Given patient's immunosuppressed state, pneumonia is a concern. Will order CBC to assess for leukocytosis and procalcitonin to assess for bacterial infection. Will also order respiratory pathogen panel and CXR and start course of antibiotics if CXR shows consolidation. If CXR is negative, will continue supportive care.   Plan: -CBC, procalcitonin, RPP results pending  -CXR pending    ADDENDUM: CBC, procalcitonin and respiratory viral panel wnl. CXR without clear focal consolidation noted. Patient reports that her pleuritic chest pain has improved but has persistent cough. I suspect that cough is likely post-viral in nature. At this time, will hold off on further evaluation with CTA PE. Will send Rx for antitussive.

## 2020-12-10 NOTE — Patient Instructions (Addendum)
[name],  It was a pleasure seeing you in clinic. Today we discussed:   Cough:  We will evaluate for any infectious causes of this with testing for any respiratory viruses and chest x-ray to check for any pneumonia. We will obtain labs to assess for any infectious causes of this. We will call you with the results and for any further testing.    If you have any questions or concerns, please call our clinic at [phone] between 9am-5pm and after hours call [phone] and ask for the internal medicine resident on call. If you feel you are having a medical emergency please call 911.   Thank you, we look forward to helping you remain healthy!

## 2020-12-11 MED ORDER — GUAIFENESIN-CODEINE 100-10 MG/5ML PO SOLN: 5.0000 mL | Freq: Four times a day (QID) | ORAL | 0 refills | Status: DC | PRN

## 2020-12-11 NOTE — Addendum Note (Signed)
Addended by: Eliezer Bottom on: 12/11/2020 07:11 PM   Modules accepted: Orders

## 2020-12-11 NOTE — Progress Notes (Signed)
Internal Medicine Clinic Attending ° °Case discussed with Dr. Aslam  At the time of the visit.  We reviewed the resident’s history and exam and pertinent patient test results.  I agree with the assessment, diagnosis, and plan of care documented in the resident’s note.  °

## 2021-01-09 NOTE — Progress Notes (Signed)
[facility] New Patient Evaluation and Consultation  Referring Provider: Shelly Bombard, MD PCP: Maudie Mercury, MD Date of Service: 01/10/2021  SUBJECTIVE Chief Complaint: New Patient (Initial Visit) (Sue [name] is a 45 y.o. female here for a consult on incontinence.)  History of Present Illness: Sue [name] is a 44 y.o. Black or African-American female seen in consultation at the request of Dr. Jodi Mourning for evaluation of incontinence.    Review of records significant for: Has leakage of urine with cough/ sneeze/ laugh that has worsened over the last year.   Urinary Symptoms: Do you leak urine? Yes  When do you leak urine? cough/sneeze   laughing   with urgency  Please describe additional other details related to leaking urine: I have to wear some form of protection in order to not mess up my cothes  How many times do you leak in one day?  Unknown (so many times)  For leakage protection, do you use pads  If using leakage protection, how many do you use in one day? 2-3  Are you bothered by your leakage? Yes  How many times do you urinate in the daytime? 6-7  How many times do you wake up at night to urinate? 2  When you urinate, does it feel like you empty your bladder completely? Yes  Do you use a catheter to help empty your bladder? No  More leakage with stress > urge. Coughs a lot with her allergies.  Has been an issue over the last 7-10 years, but worsened since she had her last child 3 years ago.  When urinating, she feels dribbling after finishing Drinks: tea and soda, does not drink a lot of water  UTIs: 1 UTI's in the last year.   Reports history of kidney or bladder stones  Pelvic Organ Prolapse Symptoms:                  She Denies a feeling of a bulge the vaginal area.  Bowel Symptom: How often do you have a bowel movement? 1-2 day  What is the consistency of your stools? soft  Do you strain to empty your bowels? No  Do you have to push on your  rectum or vagina to be able to empty your bowels? Yes  Do you have difficulty completely emptying your rectum during a bowel movement? Yes  Do you ever leak bowel contents? No   Bowel regimen: none  Sexual Function Sexually active: yes.  Sexual orientation:  heterosexual Pain with sex: No  Pelvic Pain Denies pelvic pain   Past Medical History:  Past Medical History:  Diagnosis Date   Asthma    mild by PFT's 03/16/1998   Cervical lymphadenopathy    biopsy negative( [facility])   Chronic hypertension    Complete spontaneous abortion    x2   GERD (gastroesophageal reflux disease)    History of abnormal Pap smear    s/p cryo( Dr. 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    Vaginal Pap smear, abnormal    Vitamin B12  deficiency      Past Surgical History:   Past Surgical History:  Procedure Laterality Date   biopsy of lymph node in R neck  2007.   LAPAROSCOPIC APPENDECTOMY N/A 05/08/2019   Procedure: APPENDECTOMY LAPAROSCOPIC WITH DIAGNOSTIC LAPAROSCOPY;  Surgeon: [name], MD;  Location: [facility];  Service: General;  Laterality: N/A;   LEEP     TONSILLECTOMY Bilateral 04/26/2020   Procedure: TONSILLECTOMY;  Surgeon: Laren Boom, DO;  Location: [facility];  Service: ENT;  Laterality: Bilateral;   WISDOM TOOTH EXTRACTION       Past OB/GYN History: OB History  Gravida Para Term Preterm AB Living  7 4 3 1 3 4   SAB IAB Ectopic Multiple Live Births  3     0 4    # Outcome Date GA Lbr Len/2nd Weight Sex Delivery Anes PTL Lv  7 Term 03/30/17 [redacted]w[redacted]d 04:02 / 00:08 7 lb 4 oz (3.289  kg) F Vag-Spont EPI  LIV  6 Term 07/06/05 [redacted]w[redacted]d  6 lb 12 oz (3.062 kg) M Vag-Spont None  LIV  5 SAB 2006 [redacted]w[redacted]d       DEC  4 Term 03/27/96 [redacted]w[redacted]d  7 lb 12 oz (3.515 kg) M Vag-Spont None  LIV  3 SAB 1996        DEC  2 Preterm 02/17/92 [redacted]w[redacted]d  3 lb 11 oz (1.673 kg) M Vag-Spont None  LIV     Birth Comments: pre eclampsia  1 SAB 1992        DEC  Patient's last menstrual period was 01/10/2021.  Contraception: Notrel OCP. Last pap smear was 01/02/21- neg.  Any history of abnormal pap smears: yes- s/p LEEP   Medications: She has a current medication list which includes the following prescription(s): acetaminophen, albuterol, amlodipine, calcium carbonate, cyanocobalamin, guaifenesin-codeine, infliximab, mesalamine, methimazole, multivitamin with minerals, nortrel 1/35 (28), and valacyclovir.   Allergies: Patient is allergic to ciprofloxacin, clindamycin/lincomycin, sulfamethoxazole-trimethoprim, and amoxicillin.   Social History:  Social History   Tobacco Use   Smoking status: Never   Smokeless tobacco: Never  Vaping Use   Vaping Use: Never used  Substance Use Topics   Alcohol use: Yes    Alcohol/week: 0.0 standard drinks    Comment: occasional beer/wine   Drug use: No    What is your current relationship status? Long-Term partner  Who do you live with? 2 children And fiance  Are you currently working? 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 No  Have you ever been emotionally, physically or sexually abused? No  Do you feel safe in your current relationship? Yes    Family History:   Family History  Problem Relation Age of Onset   Ulcerative colitis Mother    Hypertension Mother    Diabetes Father    Heart attack Father    Hypertension Father    Diabetes Sister    Thyroid disease Sister    Heart attack Maternal Grandmother    Stroke Maternal Grandmother    Breast cancer Neg Hx      Review of Systems: Review of Systems  Constitutional:   Negative for fever, malaise/fatigue and weight loss.  Respiratory:  Positive for cough. Negative for shortness of breath and wheezing.   Cardiovascular:  Negative for chest pain, palpitations and leg swelling.  Gastrointestinal:  Negative for abdominal pain and blood in stool.  Genitourinary:  Negative for dysuria.  Musculoskeletal:  Negative for myalgias.  Skin:  Negative  for rash.  Neurological:  Negative for dizziness and headaches.  Endo/Heme/Allergies:  Does not bruise/bleed easily.       + hot flashes  Psychiatric/Behavioral:  Negative for depression. The patient is not nervous/anxious.     OBJECTIVE Physical Exam: Vitals:   01/10/21 0938  BP: 125/82  Pulse: (!) 101  Weight: 189 lb (85.7 kg)  Height: 5\' 4"  (1.626 m)    Physical Exam Constitutional:      General: She is not in acute distress. Pulmonary:     Effort: Pulmonary effort is normal.  Abdominal:     General: There is no distension.     Palpations: Abdomen is soft.     Tenderness: There is no abdominal tenderness. There is no rebound.  Musculoskeletal:        General: No swelling. Normal range of motion.  Skin:    General: Skin is warm and dry.     Findings: No rash.  Neurological:     Mental Status: She is alert and oriented to person, place, and time.  Psychiatric:        Mood and Affect: Mood normal.        Behavior: Behavior normal.     GU / Detailed Urogynecologic Evaluation:  Pelvic Exam: Normal external female genitalia; Bartholin's and Skene's glands normal in appearance; urethral meatus normal in appearance, no urethral masses or discharge.   CST: positive  Speculum exam reveals normal vaginal mucosa without atrophy. Cervix normal appearance and blood present in vaginal vault . Uterus normal single, nontender. Adnexa no mass, fullness, tenderness.       Pelvic floor strength II/V  Pelvic floor musculature: Right levator non-tender, Right obturator non-tender, Left levator non-tender, Left  obturator non-tender  POP-Q:   POP-Q  -3                                            Aa   -3                                           Ba  -7                                              C   2.5                                            Gh  3.5                                            Pb  8                                            tvl   -2  Ap  -2                                            Bp  -7.5                                              D     Rectal Exam:  Normal external rectum  Post-Void Residual (PVR) by Bladder Scan: In order to evaluate bladder emptying, we discussed obtaining a postvoid residual and she agreed to this procedure.  Procedure: The ultrasound unit was placed on the patient's abdomen in the suprapubic region after the patient had voided. A PVR of 22 ml was obtained by bladder scan.  Laboratory Results: POC urine: large blood (currently menstruating)   ASSESSMENT AND PLAN Ms. Wurm is a 45 y.o. with:  1. SUI (stress urinary incontinence, female)   2. Urinary urgency    SUI For treatment of stress urinary incontinence,  non-surgical options include expectant management, weight loss, physical therapy, as well as a pessary.  Surgical options include a midurethral sling, Burch urethropexy, and transurethral injection of a bulking agent. - She would like to try a pessary.   2. Urinary urgency - Not as bothersome as SUI. Discussed importance of reducing bladder irritants such as soda or tea and drinking more water.   Return for pessary fitting.   Jaquita Folds, MD   Medical Decision Making:  - Reviewed/ ordered a clinical laboratory test - Review and summation of prior records

## 2021-01-10 ENCOUNTER — Other Ambulatory Visit: Payer: Self-pay

## 2021-01-10 ENCOUNTER — Other Ambulatory Visit: Payer: Self-pay | Admitting: Obstetrics

## 2021-01-10 ENCOUNTER — Encounter: Payer: Self-pay | Admitting: Obstetrics and Gynecology

## 2021-01-10 ENCOUNTER — Ambulatory Visit: Payer: Medicaid Other | Admitting: Obstetrics and Gynecology

## 2021-01-10 VITALS — BP 125/82 | HR 101 | Ht 64.0 in | Wt 189.0 lb

## 2021-01-10 DIAGNOSIS — N393 Stress incontinence (female) (male): Secondary | ICD-10-CM | POA: Diagnosis not present

## 2021-01-10 DIAGNOSIS — R3915 Urgency of urination: Secondary | ICD-10-CM | POA: Diagnosis not present

## 2021-01-10 DIAGNOSIS — Z1231 Encounter for screening mammogram for malignant neoplasm of breast: Secondary | ICD-10-CM

## 2021-01-10 LAB — POCT URINALYSIS DIPSTICK
Appearance: ABNORMAL
Bilirubin, UA: NEGATIVE
Glucose, UA: NEGATIVE
Ketones, UA: NEGATIVE
Leukocytes, UA: NEGATIVE
Nitrite, UA: NEGATIVE
Protein, UA: POSITIVE — AB
Spec Grav, UA: 1.025 (ref 1.010–1.025)
Urobilinogen, UA: 1 E.U./dL
pH, UA: 6 (ref 5.0–8.0)

## 2021-01-10 NOTE — Patient Instructions (Signed)

## 2021-01-11 ENCOUNTER — Ambulatory Visit
Admission: RE | Admit: 2021-01-11 | Discharge: 2021-01-11 | Disposition: A | Discharge status: Home / Self Care | Payer: Medicaid Other | Source: Ambulatory Visit | Attending: Obstetrics | Admitting: Obstetrics

## 2021-01-11 DIAGNOSIS — Z1231 Encounter for screening mammogram for malignant neoplasm of breast: Secondary | ICD-10-CM

## 2021-01-13 ENCOUNTER — Encounter: Payer: Self-pay | Admitting: Obstetrics and Gynecology

## 2021-01-27 DIAGNOSIS — M129 Arthropathy, unspecified: Secondary | ICD-10-CM | POA: Diagnosis not present

## 2021-01-27 DIAGNOSIS — Z79899 Other long term (current) drug therapy: Secondary | ICD-10-CM | POA: Diagnosis not present

## 2021-01-27 DIAGNOSIS — K51219 Ulcerative (chronic) proctitis with unspecified complications: Secondary | ICD-10-CM | POA: Diagnosis not present

## 2021-01-27 DIAGNOSIS — M064 Inflammatory polyarthropathy: Secondary | ICD-10-CM | POA: Diagnosis not present

## 2021-01-27 DIAGNOSIS — K529 Noninfective gastroenteritis and colitis, unspecified: Secondary | ICD-10-CM | POA: Diagnosis not present

## 2021-01-27 DIAGNOSIS — K6289 Other specified diseases of anus and rectum: Secondary | ICD-10-CM | POA: Diagnosis not present

## 2021-01-27 DIAGNOSIS — H209 Unspecified iridocyclitis: Secondary | ICD-10-CM | POA: Diagnosis not present

## 2021-01-31 ENCOUNTER — Ambulatory Visit: Payer: Medicaid Other | Admitting: Endocrinology

## 2021-02-07 ENCOUNTER — Ambulatory Visit: Payer: Medicaid Other | Admitting: Obstetrics and Gynecology

## 2021-02-28 ENCOUNTER — Ambulatory Visit: Payer: Medicaid Other | Admitting: Obstetrics and Gynecology

## 2021-03-02 IMAGING — CT CT ABDOMEN AND PELVIS WITH CONTRAST
2 of 5 series · 16 of 46 positions shown, 18 images · IV contrast (APPLIED)
Comparison: CT abdomen/pelvis 01/02/2016

CLINICAL DATA: Abdominal pain, tenderness to palpation in left
lower quadrant, left upper quadrant, concern for
diverticulitis/abscess or other acute abdominal process.

EXAM:
CT ABDOMEN AND PELVIS WITH CONTRAST
TECHNIQUE: Multidetector CT imaging of the abdomen and pelvis was performed
using the standard protocol following bolus administration of
intravenous contrast.
CONTRAST:  100mL OMNIPAQUE IOHEXOL 300 MG/ML  SOLN

[Series 2: axial st · axial · 0.93mm/px · z∈[-536,-111]mm · 13 of 97 slices shown, 15 images]
[im 6/97  soft-tissue]
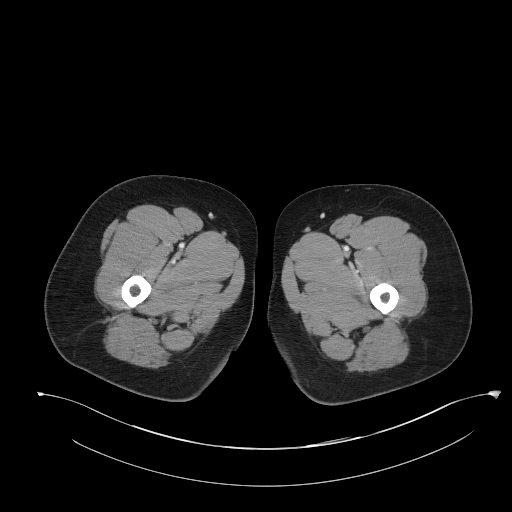
[im 6/97  bone]
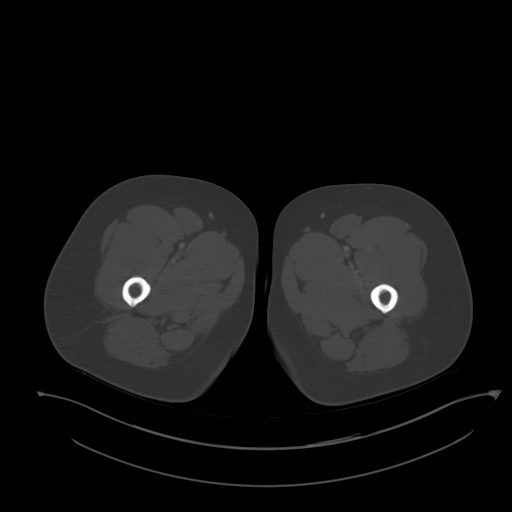
[im 16/97  soft-tissue]
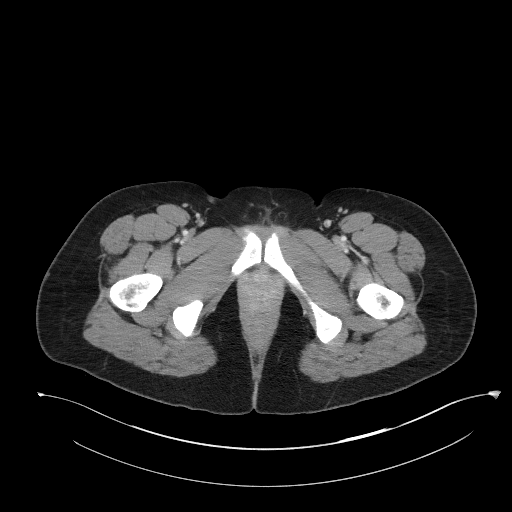
[im 21/97  soft-tissue]
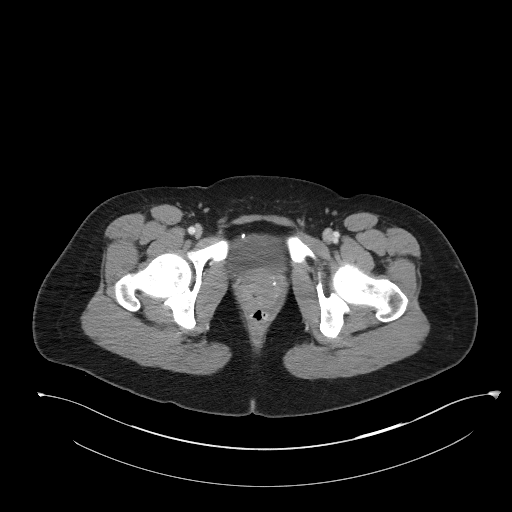
[im 26/97  soft-tissue]
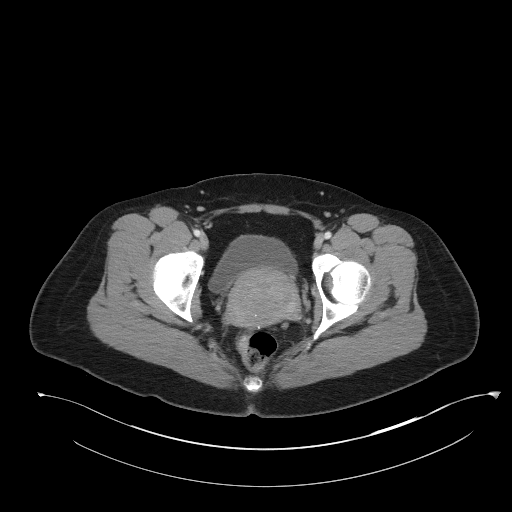
[im 36/97  soft-tissue]
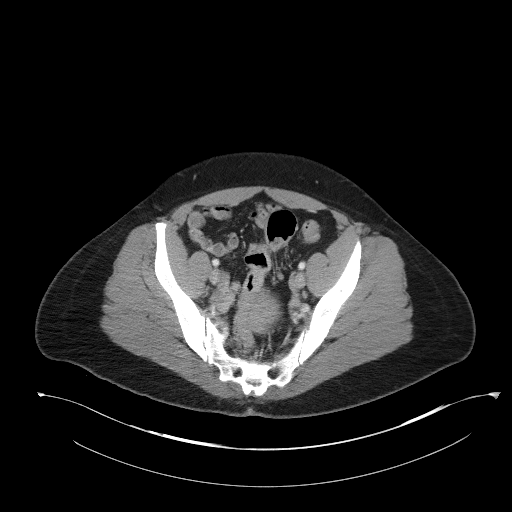
[im 41/97  soft-tissue]
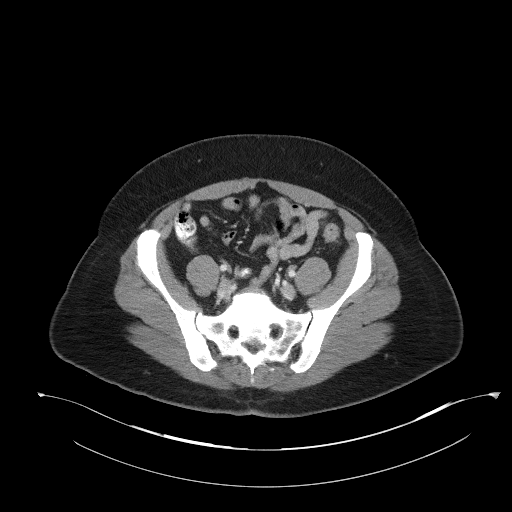
[im 51/97  soft-tissue]
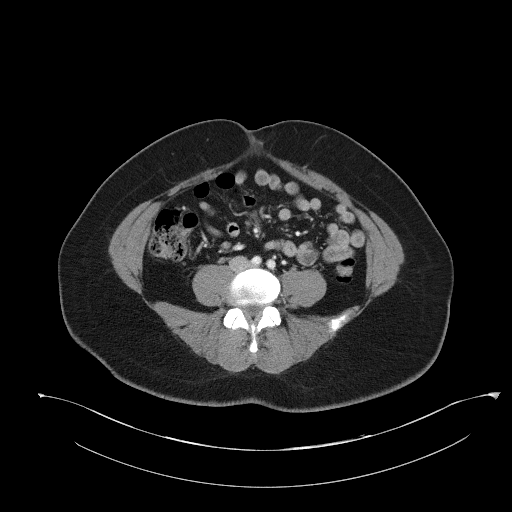
[im 56/97  soft-tissue]
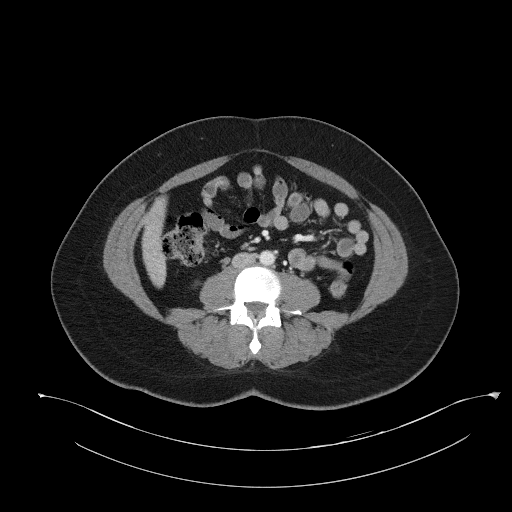
[im 61/97  soft-tissue]
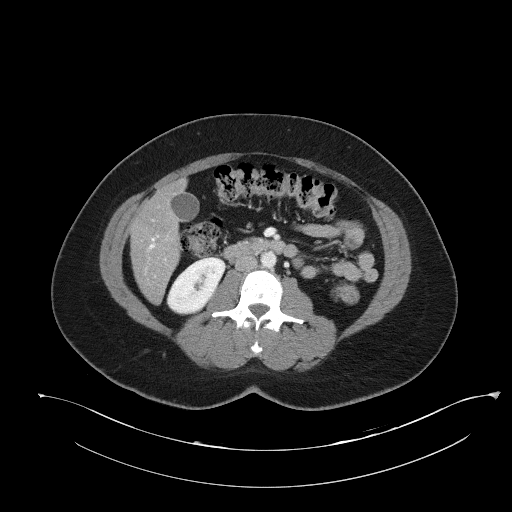
[im 61/97  bone]
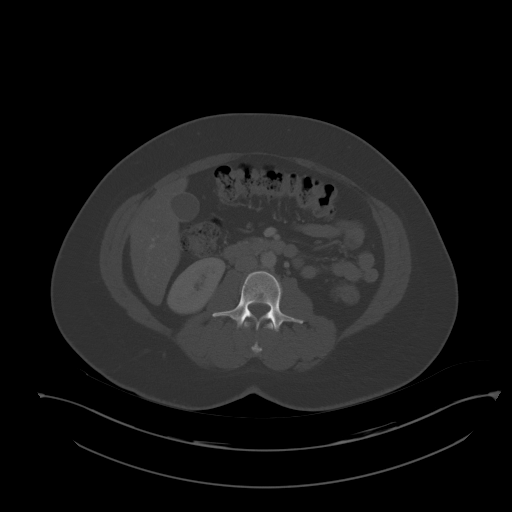
[im 71/97  soft-tissue]
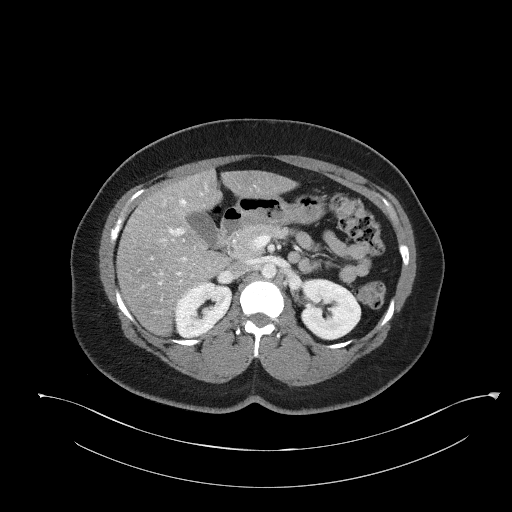
[im 76/97  soft-tissue]
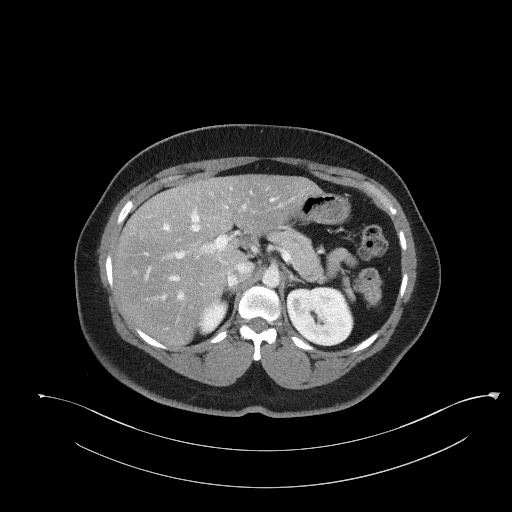
[im 81/97  soft-tissue]
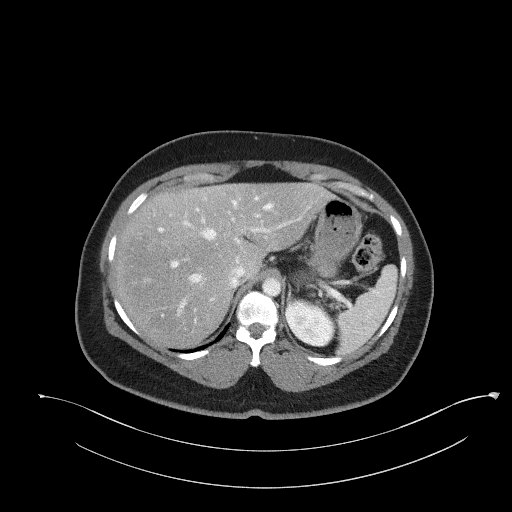
[im 91/97  soft-tissue]
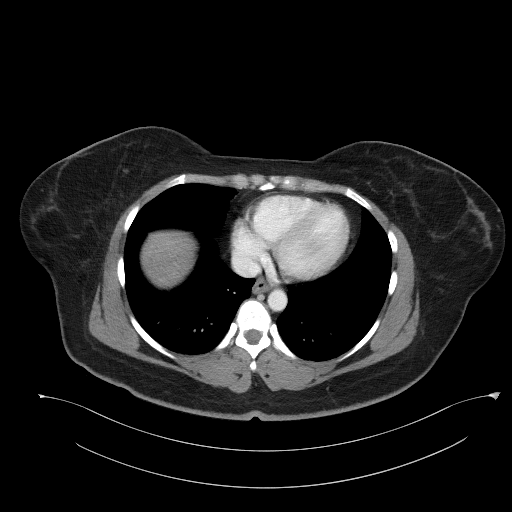

[Series 4: coronal st · coronal · 0.92mm/px · 3 of 101 slices shown]
[im 34/101  soft-tissue]
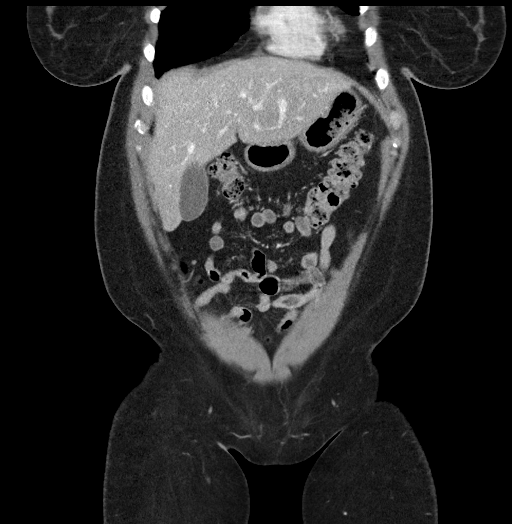
[im 45/101  soft-tissue]
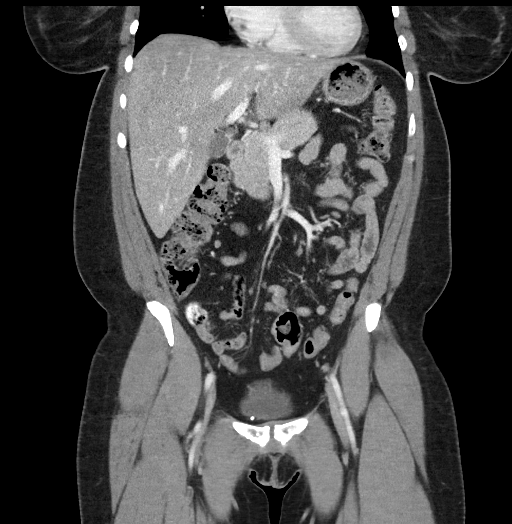
[im 56/101  soft-tissue]
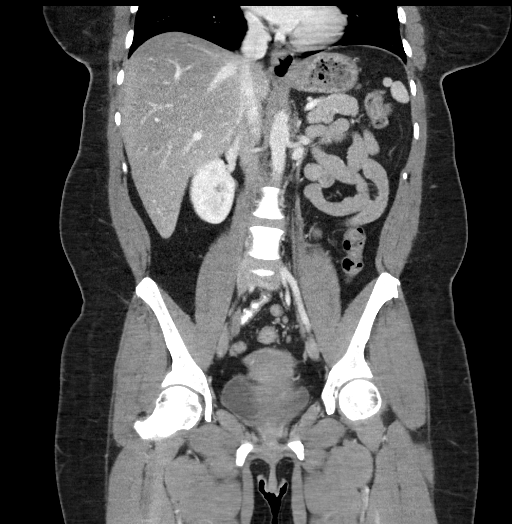

[16 of 46 positions shown; findings below may reference images not displayed]

FINDINGS: Lower chest: The imaged lung bases are clear.The imaged heart is
normal in size.

Hepatobiliary: No evidence of focal liver lesion. Ill-defined
hypoattenuation within large portions of the right hepatic lobe
consistent with steatosis.No biliary ductal dilatation.

Pancreas: No ductal dilatation or peripancreatic edema.

Spleen: No evidence of focal lesion.No splenomegaly. Incidentally
noted small left upper quadrant splenules .

Adrenals/Urinary Tract: No adrenal gland mass.No evidence of renal
mass.No hydronephrosis.Symmetric renal enhancement.Under distended
bladder.

Stomach/Bowel: Hiatal hernia.No dilated loops of bowel or bowel wall
thickening.Scattered colonic diverticulosis without convincing CT
evidence of diverticulitis. The tip of the appendix is prominent,
measuring up to 11 mm in diameter. No periappendiceal inflammatory
changes to suggest acute appendicitis.

Vascular/Lymphatic: No abdominal aortic aneurysm.

Reproductive: Incompletely assessed by CT modality.No pathologic
findings.

Other: No ascites. Small fat containing umbilical hernia.

Musculoskeletal: No acute bony abnormality.
IMPRESSION: Scattered colonic diverticulosis without convincing CT evidence of
diverticulitis.

Prominence of the distal appendix measuring up to 11 mm in diameter.
Three month CT abdomen/pelvis follow-up is recommended to exclude a
lesion/mass at this site. Tip appendicitis is not considered likely
given the provided history and lack of periappendiceal inflammatory
changes.

Small hiatal hernia.

Small fat containing umbilical hernia.

Hepatic steatosis.

## 2021-03-05 ENCOUNTER — Other Ambulatory Visit: Payer: Self-pay

## 2021-03-05 DIAGNOSIS — E538 Deficiency of other specified B group vitamins: Secondary | ICD-10-CM

## 2021-03-05 DIAGNOSIS — I10 Essential (primary) hypertension: Secondary | ICD-10-CM

## 2021-03-05 MED ORDER — CYANOCOBALAMIN 1000 MCG/ML IJ SOLN: 1000.0000 ug | INTRAMUSCULAR | 3 refills | Status: DC

## 2021-03-05 MED ORDER — AMLODIPINE BESYLATE 10 MG PO TABS: 10.0000 mg | ORAL_TABLET | Freq: Every day | ORAL | 3 refills | Status: DC

## 2021-03-05 NOTE — Telephone Encounter (Signed)
cyanocobalamin (,VITAMIN B-12,) 1000 MCG/ML injection  amLODipine (NORVASC) 10 MG tablet, refill request @ [facility] [phone] - [place], [place] - [address] AT [facility] OF [address]

## 2021-04-03 DIAGNOSIS — H938X1 Other specified disorders of right ear: Secondary | ICD-10-CM | POA: Diagnosis not present

## 2021-04-04 ENCOUNTER — Ambulatory Visit: Payer: Medicaid Other | Admitting: Obstetrics and Gynecology

## 2021-04-22 ENCOUNTER — Other Ambulatory Visit: Payer: Self-pay

## 2021-04-22 ENCOUNTER — Ambulatory Visit: Payer: Medicaid Other | Admitting: Endocrinology

## 2021-04-22 VITALS — BP 130/80 | HR 86 | Ht 64.0 in | Wt 191.4 lb

## 2021-04-22 DIAGNOSIS — E0590 Thyrotoxicosis, unspecified without thyrotoxic crisis or storm: Secondary | ICD-10-CM

## 2021-04-22 NOTE — Progress Notes (Signed)
Subjective:    Patient ID: Tacy Learn, female    DOB: 12-23-1975, 46 y.o.   MRN: [id number]  HPI Pt returns for f/u of hyperthyroidism (dx'ed 2010; she has never had dedicated thyroid imaging, but 2014 CT showed 6 x 5 mm nodule in the left lobe of the thyroid; hyperthyroidism was mild, but she was still rx'ed with tapazole, due to poor wt gain; fiancee has had vasectomy).  pt states she feels well in general.  Specifically, she denies palpitations and tremor.  she has been off tapazole x 1 month.  Past Medical History:  Diagnosis Date   Asthma    mild by PFT's 03/16/1998   Cervical lymphadenopathy    biopsy negative( [facility])   Chronic hypertension    Complete spontaneous abortion    x2   GERD (gastroesophageal reflux disease)    History of abnormal Pap smear    s/p cryo( Dr. 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    Vaginal Pap smear, abnormal    Vitamin B12 deficiency     Past Surgical History:  Procedure Laterality Date   biopsy of lymph node in R neck  2007.   LAPAROSCOPIC APPENDECTOMY N/A 05/08/2019   Procedure: APPENDECTOMY LAPAROSCOPIC WITH DIAGNOSTIC LAPAROSCOPY;  Surgeon: Ileana Roup, MD;  Location: [facility];  Service: General;  Laterality: N/A;   LEEP     TONSILLECTOMY Bilateral 04/26/2020   Procedure: TONSILLECTOMY;  Surgeon: Jason Coop, DO;  Location: [facility] OR;  Service: ENT;  Laterality: Bilateral;   WISDOM TOOTH EXTRACTION      Social History   Socioeconomic History   Marital status: Single    Spouse name: Not on file   Number of children: Not on file    Years of education: Not on file   Highest education level: Not on file  Occupational History   Not on file  Tobacco Use   Smoking status: Never   Smokeless tobacco: Never  Vaping Use   Vaping Use: Never used  Substance and Sexual Activity   Alcohol use: Yes    Alcohol/week: 0.0 standard drinks    Comment: occasional beer/wine   Drug use: No   Sexual activity: Yes    Partners: Male    Birth control/protection: Pill  Other Topics Concern   Not on file  Social History Narrative   Not on file   Social Determinants of Health   Financial Resource Strain: Not on file  Food Insecurity: Not on file  Transportation Needs: Not on file  Physical Activity: Not on file  Stress: Not on file  Social Connections: Not on file  Intimate Partner Violence: Not on file    Current Outpatient Medications on File Prior to Visit  Medication Sig Dispense Refill   acetaminophen (TYLENOL) 500 MG tablet Take 1 tablet (500 mg total) by mouth every 8 (eight) hours as needed for mild pain, moderate pain or headache. 30 tablet 0   albuterol (VENTOLIN HFA) 108 (90 Base) MCG/ACT inhaler Inhale 1-2 puffs into the lungs every 6 (six) hours as needed  for wheezing or shortness of breath. 8 g 3   amLODipine (NORVASC) 10 MG tablet Take 1 tablet (10 mg total) by mouth at bedtime. 90 tablet 3   calcium carbonate (TUMS - DOSED IN MG ELEMENTAL CALCIUM) 500 MG chewable tablet Chew 1-2 tablets by mouth 3 (three) times daily as needed for indigestion or heartburn.     cyanocobalamin (,VITAMIN B-12,) 1000 MCG/ML injection Inject 1 mL (1,000 mcg total) into the muscle every 30 (thirty) days. 10 mL 3   inFLIXimab (REMICADE IV) Inject into the vein.     mesalamine (CANASA) 1000 MG suppository Place 1,000 mg rectally at bedtime as needed (ulcerative proctitis).     Multiple Vitamin (MULTIVITAMIN WITH MINERALS) TABS tablet Take 1 tablet by mouth daily.     norethindrone-ethinyl estradiol 1/35 (NORTREL 1/35, 28,) tablet Take 1  tablet by mouth at bedtime. 28 tablet 11   valACYclovir (VALTREX) 500 MG tablet Take 1 tablet bid x 3 days prn outbreaks. (Patient taking differently: Take 500 mg by mouth See admin instructions. Take 1 tablet (500 mg) by mouth twice daily for 3 days as needed for breakouts) 30 tablet 11   No current facility-administered medications on file prior to visit.    Allergies  Allergen Reactions   Ciprofloxacin Anaphylaxis and Swelling   Clindamycin/Lincomycin Anaphylaxis and Swelling   Sulfamethoxazole-Trimethoprim Swelling    Swelling is of the lips   Amoxicillin Rash    Has patient had a PCN reaction causing immediate rash, facial/tongue/throat swelling, SOB or lightheadedness with hypotension: no Has patient had a PCN reaction causing severe rash involving mucus membranes or skin necrosis: pt had rash -- non severe  Has patient had a PCN reaction that required hospitalization: no Has patient had a PCN reaction occurring within the last 10 years: no If all of the above answers are "NO", then may proceed with Cephalosporin use.     Family History  Problem Relation Age of Onset   Ulcerative colitis Mother    Hypertension Mother    Diabetes Father    Heart attack Father    Hypertension Father    Diabetes Sister    Thyroid disease Sister    Heart attack Maternal Grandmother    Stroke Maternal Grandmother    Breast cancer Neg Hx     BP 130/80    Pulse 86    Ht 5\' 4"  (1.626 m)    Wt 191 lb 6.4 oz (86.8 kg)    SpO2 99%    BMI 32.85 kg/m     Review of Systems     Objective:   Physical Exam    Lab Results  Component Value Date   TSH 0.48 04/22/2021   T4TOTAL 12.3 (H) 10/06/2016      Assessment & Plan:  Hyperthyroidism: well-controlled, due to lingering effects of tapazole. I have sent a prescription to your pharmacy, to resume

## 2021-04-22 NOTE — Patient Instructions (Signed)
Blood tests are requested for you today.  We'll let you know about the results.  If ever you have fever while taking methimazole, stop it and call us, even if the reason is obvious, because of the risk of a rare side-effect. It is best to never miss the medication.  However, if you do miss it, next best is to double up the next time.   Please come back for a follow-up appointment in 6 months.   

## 2021-04-23 LAB — T4, FREE: Free T4: 0.95 ng/dL (ref 0.60–1.60)

## 2021-04-23 LAB — TSH: TSH: 0.48 u[IU]/mL (ref 0.35–5.50)

## 2021-04-23 MED ORDER — METHIMAZOLE 5 MG PO TABS: 5.0000 mg | ORAL_TABLET | Freq: Every day | ORAL | 3 refills | Status: DC

## 2021-04-24 NOTE — Progress Notes (Signed)
[facility]   Subjective:     Chief Complaint: Pessary Fitting  History of Present Illness: Sue [name] is a 46 y.o. female with stress incontinence and OAB who presents today for a pessary fitting.    Past Medical History: Patient  has a past medical history of Asthma, Cervical lymphadenopathy, Chronic hypertension, Complete spontaneous abortion, GERD (gastroesophageal reflux disease), History of abnormal Pap smear, History of kidney stones, HSV (herpes simplex virus) anogenital infection, Hypertension, Hypothyroidism, Normal vaginal delivery, Rheumatoid arthritis (HCC), Thyroid disease affecting pregnancy, ULCERATIVE PROCTITIS (12/26/2007), Vaginal Pap smear, abnormal, and Vitamin B12 deficiency.   Past Surgical History: She  has a past surgical history that includes biopsy of lymph node in R neck (2007.); LEEP; laparoscopic appendectomy (N/A, 05/08/2019); Wisdom tooth extraction; and Tonsillectomy (Bilateral, 04/26/2020).   Medications: She has a current medication list which includes the following prescription(s): acetaminophen, albuterol, amlodipine, calcium carbonate, cyanocobalamin, infliximab, mesalamine, methimazole, multivitamin with minerals, nortrel 1/35 (28), and valacyclovir.   Allergies: Patient is allergic to ciprofloxacin, clindamycin/lincomycin, sulfamethoxazole-trimethoprim, and amoxicillin.   Social History: Patient  reports that she has never smoked. She has never used smokeless tobacco. She reports current alcohol use. She reports that she does not use drugs.      Objective:    BP 124/79    Pulse 87  Gen: No apparent distress, A&O x 3. Pelvic Exam: Normal external female genitalia; Bartholin's and Skene's glands normal in appearance; urethral meatus normal in appearance, no urethral masses or discharge.   A size #2 incontinence dish pessary was fitted. It was comfortable but moved slightly with ambulation. Therefore a #3 incontinence dish was placed.  It was comfortable, stayed in place with valsalva and was an appropriate size on examination, with one finger fitting between the pessary and the vaginal walls. We tied a string to it and the patient demonstrated proper removal and replacement. Lot# [id number], Exp 09/05/21  POP-Q:    POP-Q   -3                                            Aa   -3                                           Ba   -7                                              C    2.5                                            Gh   3.5                                            Pb   8  tvl    -2                                            Ap   -2                                            Bp   -7.5                                              D       Assessment/Plan:    Assessment: [name] is a 46 y.o. with stress incontinence and OAB who presents for a pessary fitting. Plan:  - She was fitted with a #3 incontinence dish pessary. She will remove at least weekly .   Follow-up in 2-3 weeks for a pessary check or sooner as needed.  All questions were answered.    Marguerita BeardsMichelle N Ajaya Crutchfield, MD

## 2021-04-25 ENCOUNTER — Encounter: Payer: Self-pay | Admitting: Obstetrics and Gynecology

## 2021-04-25 ENCOUNTER — Other Ambulatory Visit: Payer: Self-pay

## 2021-04-25 ENCOUNTER — Ambulatory Visit: Payer: Medicaid Other | Admitting: Obstetrics and Gynecology

## 2021-04-25 VITALS — BP 124/79 | HR 87

## 2021-04-25 DIAGNOSIS — N393 Stress incontinence (female) (male): Secondary | ICD-10-CM | POA: Diagnosis not present

## 2021-04-25 NOTE — Patient Instructions (Signed)
You were fitted with a incontinence dish pessary.  Come back in 2-3 weeks to see how it is working.  You can take it out every week, or sooner if you desire. Clean with liquid soap and water in between uses and leave out over night on the night that you remove it. Call for any problems.  

## 2021-05-02 ENCOUNTER — Other Ambulatory Visit: Payer: Self-pay | Admitting: Obstetrics

## 2021-05-02 DIAGNOSIS — N939 Abnormal uterine and vaginal bleeding, unspecified: Secondary | ICD-10-CM

## 2021-05-12 DIAGNOSIS — K51219 Ulcerative (chronic) proctitis with unspecified complications: Secondary | ICD-10-CM | POA: Diagnosis not present

## 2021-05-12 DIAGNOSIS — M129 Arthropathy, unspecified: Secondary | ICD-10-CM | POA: Diagnosis not present

## 2021-05-12 DIAGNOSIS — H209 Unspecified iridocyclitis: Secondary | ICD-10-CM | POA: Diagnosis not present

## 2021-05-12 DIAGNOSIS — K529 Noninfective gastroenteritis and colitis, unspecified: Secondary | ICD-10-CM | POA: Diagnosis not present

## 2021-05-12 DIAGNOSIS — Z79899 Other long term (current) drug therapy: Secondary | ICD-10-CM | POA: Diagnosis not present

## 2021-05-22 NOTE — Progress Notes (Signed)
[facility] ? ? ?Subjective:  ?  ? ?Chief Complaint: No chief complaint on file. ? ?History of Present Illness: ?Jahnia Wetherholt is a 46 y.o. female with stress incontinence and OAB who presents for a pessary check. She is using a size #3 incontinence ring pessary. The pessary is causing more urinary urgency (does not leak with urge). She feels like it is a little uncomfortable. Only wears it for 4-5 hours while at work. ? ?Past Medical History: ?Patient  has a past medical history of Asthma, Cervical lymphadenopathy, Chronic hypertension, Complete spontaneous abortion, GERD (gastroesophageal reflux disease), History of abnormal Pap smear, History of kidney stones, HSV (herpes simplex virus) anogenital infection, Hypertension, Hypothyroidism, Normal vaginal delivery, Rheumatoid arthritis ([place]), Thyroid disease affecting pregnancy, ULCERATIVE PROCTITIS (12/26/2007), Vaginal Pap smear, abnormal, and Vitamin B12 deficiency.  ? ?Past Surgical History: ?She  has a past surgical history that includes biopsy of lymph node in R neck (2007.); LEEP; laparoscopic appendectomy (N/A, 05/08/2019); Wisdom tooth extraction; and Tonsillectomy (Bilateral, 04/26/2020).  ? ?Medications: ?She has a current medication list which includes the following prescription(s): acetaminophen, albuterol, amlodipine, calcium carbonate, cyanocobalamin, infliximab, mesalamine, methimazole, multivitamin with minerals, nortrel 1/35 (28), and valacyclovir.  ? ?Allergies: ?Patient is allergic to ciprofloxacin, clindamycin/lincomycin, sulfamethoxazole-trimethoprim, and amoxicillin.  ? ?Social History: ?Patient  reports that she has never smoked. She has never used smokeless tobacco. She reports current alcohol use. She reports that she does not use drugs.  ? ?  ? ?Objective:  ?  ?Physical Exam: ?There were no vitals taken for this visit. ?Gen: No apparent distress, A&O x 3. ?Detailed Urogynecologic Evaluation:  ?Pelvic Exam: Normal external female  genitalia; Bartholin's and Skene's glands normal in appearance; urethral meatus normal in appearance, no urethral masses or discharge. She was fit with a #2 incontinence dish pessary (Lot [id number], Exp 03/27/26).  It was comfortable to the patient and fit well.  ? ?POP-Q:  ?  ?POP-Q ?  ?-3  ?                                          Aa   ?-3 ?                                          Ba   ?-7  ?                                            C  ?  ?2.5  ?                                          Gh   ?3.5  ?                                          Pb   ?8  ?  tvl  ?  ?-2  ?                                          Ap   ?-2  ?                                          Bp   ?-7.5  ?                                            D  ?  ? ?  ? ?Assessment/Plan:  ?  ?Assessment: ?Ms. Garrow is a 46 y.o. with stress incontinence and OAB here for a pessary check. She is doing well. ? ?Plan: ?- Pt decided she wanted to try a different size pessary. Placed a size smaller, #2 incontinence dish.  ?- Will have her follow up in about a month to see how it is working for her.  ? ?Jaquita Folds, MD ? ? ?

## 2021-05-23 ENCOUNTER — Encounter: Payer: Self-pay | Admitting: Obstetrics and Gynecology

## 2021-05-23 ENCOUNTER — Other Ambulatory Visit: Payer: Self-pay

## 2021-05-23 ENCOUNTER — Ambulatory Visit: Payer: Medicaid Other | Admitting: Obstetrics and Gynecology

## 2021-05-23 VITALS — BP 122/81 | HR 69

## 2021-05-23 DIAGNOSIS — N393 Stress incontinence (female) (male): Secondary | ICD-10-CM

## 2021-06-19 ENCOUNTER — Telehealth: Payer: Self-pay | Admitting: *Deleted

## 2021-06-19 NOTE — Telephone Encounter (Signed)
Call from pt c/o pain right jaw, right side of her nose and throat.; also has post nasal drip. Also stated she grinds her teeth in her sleep. She has tried allergy med. No fever. No available appts until next week. [place] office schedule pt an appt on 06/24/21. I informed pt to go to [facility] to be evaluated - she stated "ok". ?

## 2021-06-20 DIAGNOSIS — M25562 Pain in left knee: Secondary | ICD-10-CM | POA: Diagnosis not present

## 2021-06-24 ENCOUNTER — Encounter: Payer: Medicaid Other | Admitting: Internal Medicine

## 2021-06-24 DIAGNOSIS — M25462 Effusion, left knee: Secondary | ICD-10-CM | POA: Diagnosis not present

## 2021-06-24 DIAGNOSIS — M069 Rheumatoid arthritis, unspecified: Secondary | ICD-10-CM | POA: Diagnosis not present

## 2021-06-24 DIAGNOSIS — M25562 Pain in left knee: Secondary | ICD-10-CM | POA: Diagnosis not present

## 2021-07-25 ENCOUNTER — Ambulatory Visit: Payer: Medicaid Other | Admitting: Obstetrics and Gynecology

## 2021-07-31 DIAGNOSIS — M5459 Other low back pain: Secondary | ICD-10-CM | POA: Diagnosis not present

## 2021-08-13 ENCOUNTER — Encounter (INDEPENDENT_AMBULATORY_CARE_PROVIDER_SITE_OTHER): Payer: Self-pay

## 2021-08-22 DIAGNOSIS — M461 Sacroiliitis, not elsewhere classified: Secondary | ICD-10-CM | POA: Diagnosis not present

## 2021-08-30 ENCOUNTER — Encounter: Payer: Self-pay | Admitting: *Deleted

## 2021-10-06 DIAGNOSIS — M359 Systemic involvement of connective tissue, unspecified: Secondary | ICD-10-CM | POA: Diagnosis not present

## 2021-10-06 DIAGNOSIS — M064 Inflammatory polyarthropathy: Secondary | ICD-10-CM | POA: Diagnosis not present

## 2021-10-06 DIAGNOSIS — T394X5D Adverse effect of antirheumatics, not elsewhere classified, subsequent encounter: Secondary | ICD-10-CM | POA: Diagnosis not present

## 2021-10-06 DIAGNOSIS — K529 Noninfective gastroenteritis and colitis, unspecified: Secondary | ICD-10-CM | POA: Diagnosis not present

## 2021-10-06 DIAGNOSIS — L409 Psoriasis, unspecified: Secondary | ICD-10-CM | POA: Diagnosis not present

## 2021-10-06 DIAGNOSIS — M129 Arthropathy, unspecified: Secondary | ICD-10-CM | POA: Diagnosis not present

## 2021-10-06 DIAGNOSIS — E559 Vitamin D deficiency, unspecified: Secondary | ICD-10-CM | POA: Diagnosis not present

## 2021-10-06 DIAGNOSIS — R21 Rash and other nonspecific skin eruption: Secondary | ICD-10-CM | POA: Diagnosis not present

## 2021-10-06 DIAGNOSIS — R5383 Other fatigue: Secondary | ICD-10-CM | POA: Diagnosis not present

## 2021-10-06 DIAGNOSIS — H209 Unspecified iridocyclitis: Secondary | ICD-10-CM | POA: Diagnosis not present

## 2021-10-06 DIAGNOSIS — K5120 Ulcerative (chronic) proctitis without complications: Secondary | ICD-10-CM | POA: Diagnosis not present

## 2021-10-06 DIAGNOSIS — Z79620 Long term (current) use of immunosuppressive biologic: Secondary | ICD-10-CM | POA: Diagnosis not present

## 2021-10-06 DIAGNOSIS — Z79899 Other long term (current) drug therapy: Secondary | ICD-10-CM | POA: Diagnosis not present

## 2021-11-17 ENCOUNTER — Other Ambulatory Visit: Payer: Self-pay | Admitting: Obstetrics and Gynecology

## 2021-11-17 ENCOUNTER — Other Ambulatory Visit: Payer: Self-pay

## 2021-11-17 DIAGNOSIS — Z1231 Encounter for screening mammogram for malignant neoplasm of breast: Secondary | ICD-10-CM

## 2021-11-26 ENCOUNTER — Telehealth: Payer: Self-pay | Admitting: Internal Medicine

## 2021-11-26 NOTE — Telephone Encounter (Signed)
MEDICATION: Methimazole 5MG   PHARMACY:  [facility] on [address]  HAS THE PATIENT CONTACTED THEIR PHARMACY?  yes  IS THIS A 90 DAY SUPPLY : unknown  IS PATIENT OUT OF MEDICATION: yes  IF NOT; HOW MUCH IS LEFT:   LAST APPOINTMENT DATE: @02 /14/2023  NEXT APPOINTMENT DATE:@12 /22/2023  DO WE HAVE YOUR PERMISSION TO LEAVE A DETAILED MESSAGE?: yes  OTHER COMMENTS:    **Let patient know to contact pharmacy at the end of the day to make sure medication is ready. **  ** Please notify patient to allow 48-72 hours to process**  **Encourage patient to contact the pharmacy for refills or they can request refills through [facility]**

## 2021-11-27 ENCOUNTER — Other Ambulatory Visit: Payer: Self-pay

## 2021-11-27 DIAGNOSIS — E0590 Thyrotoxicosis, unspecified without thyrotoxic crisis or storm: Secondary | ICD-10-CM

## 2021-11-27 MED ORDER — METHIMAZOLE 5 MG PO TABS: 5.0000 mg | ORAL_TABLET | Freq: Every day | ORAL | 3 refills | Status: DC

## 2021-11-27 NOTE — Telephone Encounter (Signed)
RX has now been sent to preferred pharmacy 

## 2021-12-19 ENCOUNTER — Other Ambulatory Visit: Payer: Self-pay

## 2021-12-19 ENCOUNTER — Ambulatory Visit: Payer: Medicaid Other

## 2021-12-19 VITALS — BP 127/80 | HR 79 | Temp 98.3°F | Ht 65.0 in | Wt 197.3 lb

## 2021-12-19 DIAGNOSIS — M0520 Rheumatoid vasculitis with rheumatoid arthritis of unspecified site: Secondary | ICD-10-CM | POA: Diagnosis not present

## 2021-12-19 DIAGNOSIS — E7849 Other hyperlipidemia: Secondary | ICD-10-CM

## 2021-12-19 DIAGNOSIS — E0590 Thyrotoxicosis, unspecified without thyrotoxic crisis or storm: Secondary | ICD-10-CM

## 2021-12-19 DIAGNOSIS — M79601 Pain in right arm: Secondary | ICD-10-CM | POA: Diagnosis not present

## 2021-12-19 DIAGNOSIS — E538 Deficiency of other specified B group vitamins: Secondary | ICD-10-CM | POA: Diagnosis not present

## 2021-12-19 DIAGNOSIS — Z0000 Encounter for general adult medical examination without abnormal findings: Secondary | ICD-10-CM

## 2021-12-19 DIAGNOSIS — I10 Essential (primary) hypertension: Secondary | ICD-10-CM

## 2021-12-19 NOTE — Assessment & Plan Note (Signed)
B12 level WNL in 09/2021.  Patient is requesting to have her B12 level checked today.  Plan: - B12 level ordered today

## 2021-12-19 NOTE — Assessment & Plan Note (Signed)
No recent lipid panel on file.    Plan: - Ordering lipid panel today

## 2021-12-19 NOTE — Assessment & Plan Note (Signed)
>>  ASSESSMENT AND PLAN FOR RHEUMATOID ARTERITIS (HCC) WRITTEN ON 12/19/2021  9:19 AM BY MAPP, Gaylyn Cheers, MD  Patient gives herself humira injections (40 mg) every Friday. Patient is also taking acetaminophen 500 MG for pain. She is no longer taking predniSONE 5 MG. Patient states that she is compliant with these medications. She denies having any recent flares of her condition.   Plan: - Continue weekly humera and acetaminophen 500 MG

## 2021-12-19 NOTE — Assessment & Plan Note (Signed)
Patient gives herself humira injections (40 mg) every Friday. Patient is also taking acetaminophen 500 MG for pain. She is no longer taking predniSONE 5 MG. Patient states that she is compliant with these medications. She denies having any recent flares of her condition.   Plan: - Continue weekly humera and acetaminophen 500 MG

## 2021-12-19 NOTE — Assessment & Plan Note (Signed)
Current medications include amLODipine 10 MG. Patient states that she is compliant with this medication. Patient states that she does check her BP regularly at home and usually notes values near 120s/80s. Patient denies HA, lightheadedness, dizziness, CP, or SOB. Initial BP today is 138/84. Repeat BP is 127/80.    Plan: - Continue amLODipine 10 MG

## 2021-12-19 NOTE — Assessment & Plan Note (Signed)
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 2014 CT demonstrated 6 x 5 mm nodule in the left lobe of the thyroid. Patient was prescribed methimazole 5 MG due to poor weight gain. Patient states that she is compliant with this medication. She follows up with [facility] endocrinology and has an appointment next month. Last TSH and Free T4 WNL in 04/2021. Patient denies anxiety, difficulty concentrating, hair loss, neck swelling, hand tremor, or heat intolerance.   Plan: - Continue methimazole 5 MG  - F/u with [facility] endocrinology

## 2021-12-19 NOTE — Assessment & Plan Note (Signed)
Patient states that she received her influenza vaccine 3 days ago on her right arm and has since noticed intermittent sharp pain radiating down her right shoulder down to her right hand.  She states that this pain is not debilitating but is mildly bothersome.  Suspect that her vaccine likely caused inflammation in her right upper extremity that may be pressing on a nerve.  Patient also has a history of rheumatoid arthritis therefore it is possible that cervical radiculopathy could explain her symptoms.   Plan: - Continue to monitor for symptom reoccurrence - Patient instructed to call the clinic if her symptoms worsen or persist for several weeks

## 2021-12-19 NOTE — Patient Instructions (Addendum)
Thank you for coming to see [place] in clinic [name].   Plan: - Please continue taking amlodipine 10 mg for your high blood pressure  - Please continue with weekly humira injections and tylenol for your rheumatoid arthritis  - Please continue methimazole 5 mg for your hyperthyroidism and follow up with endocrinology at your scheduled appointment  - We will draw blood from you today to check your vitamin B12 level and cholesterol (we will call you with these results)  It was very nice to meet you.

## 2021-12-19 NOTE — Assessment & Plan Note (Addendum)
Patient was diagnosed in 2010. 2014 CT demonstrated 6 x 5 mm nodule in the left lobe of the thyroid. Patient was prescribed methimazole 5 MG due to poor weight gain. Patient states that she is compliant with this medication. She follows up with [facility] endocrinology and has an appointment next month. Last TSH and Free T4 WNL in 04/2021. Patient denies anxiety, difficulty concentrating, hair loss, neck swelling, hand tremor, or heat intolerance.   Plan: - Continue methimazole 5 MG  - F/u with [facility] endocrinology

## 2021-12-19 NOTE — Progress Notes (Signed)
Internal Medicine Clinic Attending  I saw and evaluated the patient.  I personally confirmed the key portions of the history and exam documented by Dr. Mapp and I reviewed pertinent patient test results.  The assessment, diagnosis, and plan were formulated together and I agree with the documentation in the resident's note.  

## 2021-12-19 NOTE — Progress Notes (Signed)
CC: yearly visit  HPI:  [name] is a 46 y.o. female with past medical history of HTN, GERD, RA, hyperthyroidism, fatty liver, and carpal tunnel that presents for her yearly visit.   Patient has a history of hypertension. Current medications include amLODipine 10 MG. Patient states that she is compliant with this medication. Patient states that she does check her BP regularly at home and usually notes values near 120s/80s. Patient denies HA, lightheadedness, dizziness, CP, or SOB.  Patient also has a history of rheumatoid arthritis. Patient gives herself humira injections (40 mg) every Friday. Patient is also taking acetaminophen 500 MG for pain. She is no longer taking predniSONE 5 MG. Patient states that she is compliant with these medications. She denies having any recent flares of her condition.   Patient also has a history of hypothyroidism. She was diagnosed in 2010. 2014 CT demonstrated 6 x 5 mm nodule in the left lobe of the thyroid. Patient was prescribed methimazole 5 MG due to poor weight gain. Patient states that she is compliant with this medication. She follows up with [facility] endocrinology and has an appointment next month. Patient denies anxiety, difficulty concentrating, hair loss, neck swelling, hand tremor, or heat intolerance.   Patient has a history of vitamin B12 deficiency is requesting having her level checked today.  Patient states that she received her influenza vaccine 3 days ago on her right arm and has since noticed intermittent sharp pain radiating down her right shoulder down to her right hand.  She states that this pain is not debilitating but is mildly bothersome.  Allergies as of 12/19/2021       Reactions   Ciprofloxacin Anaphylaxis, Swelling   Clindamycin/lincomycin Anaphylaxis, Swelling   Sulfamethoxazole-trimethoprim Swelling   Swelling is of the lips   Amoxicillin Rash   Has patient had a PCN reaction causing immediate rash, facial/tongue/throat  swelling, SOB or lightheadedness with hypotension: no Has patient had a PCN reaction causing severe rash involving mucus membranes or skin necrosis: pt had rash -- non severe  Has patient had a PCN reaction that required hospitalization: no Has patient had a PCN reaction occurring within the last 10 years: no If all of the above answers are "NO", then may proceed with Cephalosporin use.        Medication List        Accurate as of December 19, 2021  7:11 AM. If you have any questions, ask your nurse or doctor.          acetaminophen 500 MG tablet Commonly known as: TYLENOL Take 1 tablet (500 mg total) by mouth every 8 (eight) hours as needed for mild pain, moderate pain or headache.   albuterol 108 (90 Base) MCG/ACT inhaler Commonly known as: VENTOLIN HFA Inhale 1-2 puffs into the lungs every 6 (six) hours as needed for wheezing or shortness of breath.   amLODipine 10 MG tablet Commonly known as: NORVASC Take 1 tablet (10 mg total) by mouth at bedtime.   calcium carbonate 500 MG chewable tablet Commonly known as: TUMS - dosed in mg elemental calcium Chew 1-2 tablets by mouth 3 (three) times daily as needed for indigestion or heartburn.   cyanocobalamin 1000 MCG/ML injection Commonly known as: VITAMIN B12 Inject 1 mL (1,000 mcg total) into the muscle every 30 (thirty) days.   mesalamine 1000 MG suppository Commonly known as: CANASA Place 1,000 mg rectally at bedtime as needed (ulcerative proctitis).   methimazole 5 MG tablet Commonly known as: TAPAZOLE  Take 1 tablet (5 mg total) by mouth daily.   multivitamin with minerals Tabs tablet Take 1 tablet by mouth daily.   Nortrel 1/35 (28) tablet Generic drug: norethindrone-ethinyl estradiol 1/35 TAKE 1 TABLET BY MOUTH DAILY   predniSONE 5 MG tablet Commonly known as: DELTASONE Take by mouth.   REMICADE IV Inject into the vein.   valACYclovir 500 MG tablet Commonly known as: VALTREX Take 1 tablet bid x 3 days  prn outbreaks. What changed:  how much to take how to take this when to take this additional instructions         Past Medical History:  Diagnosis Date   Asthma    mild by PFT's 03/16/1998   Cervical lymphadenopathy    biopsy negativeMarvetta Gibbons)   Chronic hypertension    Complete spontaneous abortion    x2   GERD (gastroesophageal reflux disease)    History of abnormal Pap smear    s/p cryo( Dr. 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    Vaginal Pap smear, abnormal    Vitamin B12 deficiency    Review of Systems:  per HPI.   Physical Exam: Vitals:   12/19/21 0909  BP: 127/80  Pulse: 79  Temp: 98.3 F (36.8 C)  TempSrc: Oral  SpO2: 100%  Weight: 197 lb 4.8 oz (89.5 kg)  Height: 5\' 5"  (1.651 m)   Constitutional:  appears comfortable  Neck: Normal range of motion. Mild diffuse swelling of the thyroid with minimal tenderness to palpation.  Cardiovascular: Regular rate, regular rhythm. No murmurs, rubs, or gallops. Normal radial and PT pulses bilaterally. No LE edema.  Pulmonary: Normal respiratory effort. No wheezes, rales, or rhonchi.   Abdominal: Normal bowel sounds.  Musculoskeletal: Normal range of motion.     Neurological: Alert and oriented to person, place, and time. Non-focal. Skin: warm and dry.    Assessment & Plan:   See Encounters Tab for problem based charting.  Patient seen with Dr. 

## 2021-12-20 LAB — LIPID PANEL
Chol/HDL Ratio: 4.5 ratio — ABNORMAL HIGH (ref 0.0–4.4)
Cholesterol, Total: 194 mg/dL (ref 100–199)
HDL: 43 mg/dL (ref >39)
LDL Chol Calc (NIH): 101 mg/dL — ABNORMAL HIGH (ref 0–99)
Triglycerides: 296 mg/dL — ABNORMAL HIGH (ref 0–149)
VLDL Cholesterol Cal: 50 mg/dL — ABNORMAL HIGH (ref 5–40)

## 2021-12-20 LAB — VITAMIN B12: Vitamin B-12: 891 pg/mL (ref 232–1245)

## 2021-12-22 DIAGNOSIS — E785 Hyperlipidemia, unspecified: Secondary | ICD-10-CM | POA: Insufficient documentation

## 2021-12-22 NOTE — Assessment & Plan Note (Signed)
Addendum (12/22/2021): - Patient called and updated on abnormal results of recent lipid panel. Current ASCVD risk is 3.4%. I discussed that it is not recommended that she be started on a statin at this time. Discussed methods of decreasing cholesterol including increased exercise and decreasing intake of foods high in cholesterol. Patient agrees with the plan.    Plan: - Recheck lipid panel next year

## 2021-12-22 NOTE — Progress Notes (Signed)
Patient called and updated on abnormal results. Current ASCVD risk is 3.4%. I discussed that it is not recommended that she be started on a statin at this time. Discussed methods of decreasing cholesterol including increased exercise and decreasing intake of foods high in cholesterol. Patient agrees with the plan.

## 2021-12-22 NOTE — Progress Notes (Signed)
Patient called and updated on results.

## 2021-12-29 ENCOUNTER — Encounter: Payer: Self-pay | Admitting: *Deleted

## 2022-01-05 ENCOUNTER — Other Ambulatory Visit: Payer: Self-pay | Admitting: Obstetrics

## 2022-01-05 DIAGNOSIS — N939 Abnormal uterine and vaginal bleeding, unspecified: Secondary | ICD-10-CM

## 2022-01-16 ENCOUNTER — Ambulatory Visit
Admission: RE | Admit: 2022-01-16 | Discharge: 2022-01-16 | Disposition: A | Discharge status: Home / Self Care | Payer: Medicaid Other | Source: Ambulatory Visit | Attending: Licensed Clinical Social Worker | Admitting: Licensed Clinical Social Worker

## 2022-01-16 ENCOUNTER — Ambulatory Visit (INDEPENDENT_AMBULATORY_CARE_PROVIDER_SITE_OTHER): Payer: Medicaid Other | Admitting: Advanced Practice Midwife

## 2022-01-16 ENCOUNTER — Encounter: Payer: Self-pay | Admitting: Advanced Practice Midwife

## 2022-01-16 ENCOUNTER — Other Ambulatory Visit (HOSPITAL_COMMUNITY)
Admission: RE | Admit: 2022-01-16 | Discharge: 2022-01-16 | Disposition: A | Discharge status: Home / Self Care | Payer: Medicaid Other | Source: Ambulatory Visit | Attending: Advanced Practice Midwife | Admitting: Advanced Practice Midwife

## 2022-01-16 VITALS — BP 133/84 | HR 81 | Ht 63.0 in | Wt 195.2 lb

## 2022-01-16 DIAGNOSIS — Z113 Encounter for screening for infections with a predominantly sexual mode of transmission: Secondary | ICD-10-CM | POA: Diagnosis not present

## 2022-01-16 DIAGNOSIS — R232 Flushing: Secondary | ICD-10-CM | POA: Diagnosis not present

## 2022-01-16 DIAGNOSIS — Z01419 Encounter for gynecological examination (general) (routine) without abnormal findings: Secondary | ICD-10-CM

## 2022-01-16 DIAGNOSIS — R6882 Decreased libido: Secondary | ICD-10-CM | POA: Diagnosis not present

## 2022-01-16 DIAGNOSIS — N946 Dysmenorrhea, unspecified: Secondary | ICD-10-CM

## 2022-01-16 DIAGNOSIS — Z124 Encounter for screening for malignant neoplasm of cervix: Secondary | ICD-10-CM

## 2022-01-16 DIAGNOSIS — N939 Abnormal uterine and vaginal bleeding, unspecified: Secondary | ICD-10-CM | POA: Insufficient documentation

## 2022-01-16 DIAGNOSIS — Z1231 Encounter for screening mammogram for malignant neoplasm of breast: Secondary | ICD-10-CM

## 2022-01-16 MED ORDER — NORTREL 1/35 (28) 1-35 MG-MCG PO TABS: 1.0000 | ORAL_TABLET | Freq: Every day | ORAL | 2 refills | Status: DC

## 2022-01-16 NOTE — Addendum Note (Signed)
Addended by: Sharen Counter A on: 01/16/2022 11:32 AM   Modules accepted: Orders

## 2022-01-16 NOTE — Progress Notes (Addendum)
Subjective:     Sue [name] is a 46 y.o. female here at [facility] for a routine exam.  Current complaints: hot flashes, heavier more painful periods, and low libido.  Personal health questionnaire reviewed: yes.  Do you have a primary care provider? yes Do you feel safe at home? yes  Flowsheet Row Office Visit from 01/16/2022 in [facility]  PHQ-2 Total Score 0       Health Maintenance Due  Topic Date Due   COVID-19 Vaccine (3 - Moderna risk series) 07/25/2019   INFLUENZA VACCINE  10/07/2021   MAMMOGRAM  01/11/2022     Risk factors for chronic health problems: Smoking: Alchohol/how much: Pt BMI: Body mass index is 34.58 kg/m.   Gynecologic History Patient's last menstrual period was 01/07/2022. Contraception:  OCPs and vasectomy Last Pap: 2022. Results were: normal. Discussed  Last mammogram: 2022. Results were: normal  Obstetric History OB History  Gravida Para Term Preterm AB Living  7 4 3 1 3 4   SAB IAB Ectopic Multiple Live Births  3     0 4    # Outcome Date GA Lbr Len/2nd Weight Sex Delivery Anes PTL Lv  7 Term 03/30/17 [redacted]w[redacted]d 04:02 / 00:08 7 lb 4 oz (3.289 kg) F Vag-Spont EPI  LIV  6 Term 07/06/05 [redacted]w[redacted]d  6 lb 12 oz (3.062 kg) M Vag-Spont None  LIV  5 SAB 2006 [redacted]w[redacted]d       DEC  4 Term 03/27/96 [redacted]w[redacted]d  7 lb 12 oz (3.515 kg) M Vag-Spont None  LIV  3 SAB 1996        DEC  2 Preterm 02/17/92 [redacted]w[redacted]d  3 lb 11 oz (1.673 kg) M Vag-Spont None  LIV     Birth Comments: pre eclampsia  1 SAB 1992        DEC     The following portions of the patient's history were reviewed and updated as appropriate: allergies, current medications, past family history, past medical history, past social history, past surgical history, and problem list.  Review of Systems Pertinent items noted in HPI and remainder of comprehensive ROS otherwise negative.    Objective:   BP 133/84   Pulse 81   Ht 5\' 3"  (1.6 m)   Wt 195 lb 3.2 oz (88.5 kg)   LMP 01/07/2022    BMI 34.58 kg/m  VS reviewed, nursing note reviewed,  Constitutional: well developed, well nourished, no distress HEENT: normocephalic, thyroid without enlargement or mass HEART: normal rate, heart sounds, regular rhythm RESP: normal effort, lung sounds clear and equal bilaterally  Breast Exam:   exam performed: right breast normal without mass, skin or nipple changes or axillary nodes, left breast normal without mass, skin or nipple changes or axillary nodes Abdomen: soft Neuro: alert and oriented x 3 Skin: warm, dry Psych: affect normal Pelvic exam:Performed: Cervix pink, visually closed, without lesion, scant white creamy discharge, vaginal walls and external genitalia normal Bimanual exam: Cervix 0/long/high, firm, anterior, neg CMT, uterus nontender, nonenlarged, adnexa without tenderness, enlargement, or mass       Assessment/Plan:   1. Dysmenorrhea  - PELVIC COMPLETE WITH TRANSVAGINAL; Future  2. Abnormal uterine bleeding (AUB) --Bleeding heavier and more painful in last 2-3 years.   --Continue OCPs for now, 13/03/2021 and labs with follow up in 2-3 months with MD  - norethindrone-ethinyl estradiol 1/35 (NORTREL 1/35, 28,) tablet; Take 1 tablet by mouth at bedtime.  Dispense: 28 tablet; Refill: 2  3. Hot flashes --Pt having regular periods with OCPs --Will evaluate for menopause, as OCPs may cause withdrawal bleeding but I wouldn't expect it to be heavy. --Consider nonhormonal treatments for hot flashes like Veozah, SSRIs, Gabapentin, etc.  --F/U with Dr Clearance Coots   - FSH - LH - Testosterone  4. Low libido --x 2 years, no changes in relationship or other stressors --Will do perimenopausal lab work today, pt following up with Dr Clearance Coots in 2-3 months - Testosterone  5. Routine screening for STI (sexually transmitted infection) --Cervicovaginal swab - RPR - HIV antibody (with reflex) - Hepatitis B Surface AntiGEN - Hepatitis C Antibody   6. Screening for cervical  cancer  - Cytology - PAP( [place])  7. Well woman exam with routine gynecological exam --M[facility] scheduled [facility] at [facility] --See above for gyn concerns --Discussed Pap recommendations as every 3-5 years given normal Pap in 2022. Pt desires Pap today, will consider Q 3 years in the future.    Return in about 4 weeks (around 02/13/2022) for Gyn follow up for Abnormal Uterine Bleeding.   Sharen Counter, CNM 10:30 AM

## 2022-01-16 NOTE — Progress Notes (Signed)
Pt presents for AEX. Last PAP 12-03-20. Pt requesting PAP today and STD testing. Pt c/o hot flashes and has concerns about menopause. Pt scheduled for mammogram today.

## 2022-01-17 LAB — RPR: RPR Ser Ql: NONREACTIVE

## 2022-01-17 LAB — HEPATITIS B SURFACE ANTIGEN: Hepatitis B Surface Ag: NEGATIVE

## 2022-01-17 LAB — HIV ANTIBODY (ROUTINE TESTING W REFLEX): HIV Screen 4th Generation wRfx: NONREACTIVE

## 2022-01-17 LAB — FOLLICLE STIMULATING HORMONE: FSH: 5.5 m[IU]/mL

## 2022-01-17 LAB — TESTOSTERONE: Testosterone: 15 ng/dL (ref 4–50)

## 2022-01-17 LAB — HEPATITIS C ANTIBODY: Hep C Virus Ab: NONREACTIVE

## 2022-01-17 LAB — LUTEINIZING HORMONE: LH: 3.6 m[IU]/mL

## 2022-01-19 LAB — CERVICOVAGINAL ANCILLARY ONLY
Chlamydia: NEGATIVE
Comment: NEGATIVE
Comment: NEGATIVE
Comment: NORMAL
Neisseria Gonorrhea: NEGATIVE
Trichomonas: NEGATIVE

## 2022-01-20 LAB — CYTOLOGY - PAP
Comment: NEGATIVE
Diagnosis: NEGATIVE
High risk HPV: NEGATIVE

## 2022-01-26 ENCOUNTER — Other Ambulatory Visit: Payer: Medicaid Other

## 2022-02-03 ENCOUNTER — Ambulatory Visit
Admission: RE | Admit: 2022-02-03 | Discharge: 2022-02-03 | Disposition: A | Discharge status: Home / Self Care | Payer: Medicaid Other | Source: Ambulatory Visit | Attending: Advanced Practice Midwife | Admitting: Advanced Practice Midwife

## 2022-02-03 ENCOUNTER — Other Ambulatory Visit: Payer: Self-pay | Admitting: Advanced Practice Midwife

## 2022-02-03 DIAGNOSIS — N946 Dysmenorrhea, unspecified: Secondary | ICD-10-CM

## 2022-02-05 DIAGNOSIS — L720 Epidermal cyst: Secondary | ICD-10-CM | POA: Diagnosis not present

## 2022-02-12 ENCOUNTER — Ambulatory Visit
Admission: RE | Admit: 2022-02-12 | Discharge: 2022-02-12 | Disposition: A | Discharge status: Home / Self Care | Payer: Medicaid Other | Source: Ambulatory Visit | Attending: Advanced Practice Midwife | Admitting: Advanced Practice Midwife

## 2022-02-12 DIAGNOSIS — N946 Dysmenorrhea, unspecified: Secondary | ICD-10-CM | POA: Insufficient documentation

## 2022-02-19 ENCOUNTER — Other Ambulatory Visit: Payer: Self-pay | Admitting: Internal Medicine

## 2022-02-19 DIAGNOSIS — I10 Essential (primary) hypertension: Secondary | ICD-10-CM

## 2022-02-24 ENCOUNTER — Ambulatory Visit: Payer: Medicaid Other | Admitting: Obstetrics

## 2022-02-27 ENCOUNTER — Ambulatory Visit: Payer: Medicaid Other | Admitting: Internal Medicine

## 2022-02-27 ENCOUNTER — Ambulatory Visit: Payer: Medicaid Other | Admitting: Obstetrics

## 2022-02-27 ENCOUNTER — Encounter: Payer: Self-pay | Admitting: Internal Medicine

## 2022-02-27 ENCOUNTER — Encounter: Payer: Self-pay | Admitting: Obstetrics

## 2022-02-27 VITALS — BP 126/83 | HR 83 | Wt 195.7 lb

## 2022-02-27 VITALS — BP 120/84 | HR 85 | Ht 63.0 in | Wt 195.6 lb

## 2022-02-27 DIAGNOSIS — R232 Flushing: Secondary | ICD-10-CM

## 2022-02-27 DIAGNOSIS — R6882 Decreased libido: Secondary | ICD-10-CM

## 2022-02-27 DIAGNOSIS — N939 Abnormal uterine and vaginal bleeding, unspecified: Secondary | ICD-10-CM

## 2022-02-27 DIAGNOSIS — N946 Dysmenorrhea, unspecified: Secondary | ICD-10-CM | POA: Diagnosis not present

## 2022-02-27 DIAGNOSIS — E0590 Thyrotoxicosis, unspecified without thyrotoxic crisis or storm: Secondary | ICD-10-CM

## 2022-02-27 MED ORDER — NORTREL 1/35 (28) 1-35 MG-MCG PO TABS: 1.0000 | ORAL_TABLET | Freq: Every day | ORAL | 11 refills | Status: DC

## 2022-02-27 NOTE — Progress Notes (Signed)
Pt is in the office to follow up after u/s on 02/12/22.

## 2022-02-27 NOTE — Progress Notes (Signed)
Patient ID: Sue [name], female   DOB: 08-Mar-1976, 46 y.o.   MRN: [id number]  HPI  Sue [name] is a 46 y.o.-year-old female, returning for follow-up for thyrotoxicosis.  She previously saw Dr. Everardo All, last visit 10 mo ago. Her mother, Charma Igo, is also my pt.  Patient has been having mild subclinical thyrotoxicosis intermittently since 2009.   She was started on methimazole 4 years ago, during a pregnancy, currently at 5 mg daily.  She has no complaints at this visit other than red eyes and occasional blurry vision when driving.  She sees Dr. Dione Booze.  I reviewed pt's thyroid tests: Lab Results  Component Value Date   TSH 0.48 04/22/2021   TSH 0.60 07/26/2020   TSH 0.71 10/27/2019   TSH 1.29 06/27/2019   TSH 0.655 09/12/2018   TSH 0.25 (L) 04/07/2018   TSH 0.60 05/14/2017   TSH 0.75 03/11/2017   TSH 0.28 (L) 01/11/2017   TSH 0.42 11/26/2016   FREET4 0.95 04/22/2021   FREET4 0.78 07/26/2020   FREET4 1.1 10/27/2019   FREET4 1.02 06/27/2019   FREET4 1.26 09/12/2018   FREET4 1.06 04/07/2018   FREET4 0.78 05/14/2017   FREET4 0.58 (L) 03/11/2017   FREET4 0.63 01/11/2017   FREET4 0.69 11/26/2016   Antithyroid antibodies: No results found for: "TSI"  Pt denies: - feeling nodules in neck - hoarseness - dysphagia - choking - SOB with lying down  She mentions: - heavy, painful. - Hot flashes  No: - fatigue - tremors - anxiety - palpitations - hyperdefecation - weight loss - hair loss  Pt has a FH of thyroid ds. In her sister and mother. No FH of thyroid cancer. No h/o radiation tx to head or neck. No steroid use. No herbal supplements. No Biotin use.  Pt. 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  ROS: + see HPI  Past Medical History:  Diagnosis Date   Asthma    mild by PFT's 03/16/1998   Cervical lymphadenopathy    biopsy negative( [facility].  [facility])   Chronic hypertension    Complete spontaneous abortion    x2   GERD (gastroesophageal reflux disease)    History of abnormal Pap smear    s/p cryo( Dr. 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    Vaginal Pap smear, abnormal    Vitamin B12 deficiency    Past Surgical History:  Procedure Laterality Date   biopsy of lymph node in R neck  2007.   LAPAROSCOPIC APPENDECTOMY N/A 05/08/2019   Procedure: APPENDECTOMY LAPAROSCOPIC WITH DIAGNOSTIC LAPAROSCOPY;  Surgeon: [name], MD;  Location: [facility];  Service: General;  Laterality: N/A;   LEEP     TONSILLECTOMY Bilateral 04/26/2020   Procedure: TONSILLECTOMY;  Surgeon: Laren Boom, DO;  Location: [facility] OR;  Service: ENT;  Laterality: Bilateral;   WISDOM TOOTH EXTRACTION     Social History   Socioeconomic History   Marital status: Single    Spouse name: Not  on file   Number of children: Not on file   Years of education: Not on file   Highest education level: Not on file  Occupational History   Not on file  Tobacco Use   Smoking status: Never   Smokeless tobacco: Never  Vaping Use   Vaping Use: Never used  Substance and Sexual Activity   Alcohol use: Yes    Alcohol/week: 0.0 standard drinks of alcohol    Comment: occasional beer/wine   Drug use: No   Sexual activity: Yes    Partners: Male    Birth control/protection: Pill  Other Topics Concern   Not on file  Social History Narrative   Not on file   Social Determinants of Health   Financial Resource Strain: Not on file  Food Insecurity: Not on file   Transportation Needs: Not on file  Physical Activity: Not on file  Stress: Not on file  Social Connections: Not on file  Intimate Partner Violence: Not on file   Current Outpatient Medications on File Prior to Visit  Medication Sig Dispense Refill   acetaminophen (TYLENOL) 500 MG tablet Take 1 tablet (500 mg total) by mouth every 8 (eight) hours as needed for mild pain, moderate pain or headache. 30 tablet 0   albuterol (VENTOLIN HFA) 108 (90 Base) MCG/ACT inhaler Inhale 1-2 puffs into the lungs every 6 (six) hours as needed for wheezing or shortness of breath. 8 g 3   amLODipine (NORVASC) 10 MG tablet Take 1 tablet (10 mg total) by mouth at bedtime. 90 tablet 3   calcium carbonate (TUMS - DOSED IN MG ELEMENTAL CALCIUM) 500 MG chewable tablet Chew 1-2 tablets by mouth 3 (three) times daily as needed for indigestion or heartburn.     cyanocobalamin (,VITAMIN B-12,) 1000 MCG/ML injection Inject 1 mL (1,000 mcg total) into the muscle every 30 (thirty) days. 10 mL 3   inFLIXimab (REMICADE IV) Inject into the vein. (Patient not taking: Reported on 01/16/2022)     mesalamine (CANASA) 1000 MG suppository Place 1,000 mg rectally at bedtime as needed (ulcerative proctitis).     methimazole (TAPAZOLE) 5 MG tablet Take 1 tablet (5 mg total) by mouth daily. 90 tablet 3   Multiple Vitamin (MULTIVITAMIN WITH MINERALS) TABS tablet Take 1 tablet by mouth daily.     valACYclovir (VALTREX) 500 MG tablet Take 1 tablet bid x 3 days prn outbreaks. (Patient taking differently: Take 500 mg by mouth See admin instructions. Take 1 tablet (500 mg) by mouth twice daily for 3 days as needed for breakouts) 30 tablet 11   No current facility-administered medications on file prior to visit.   Allergies  Allergen Reactions   Ciprofloxacin Anaphylaxis and Swelling   Clindamycin/Lincomycin Anaphylaxis and Swelling   Sulfamethoxazole-Trimethoprim Swelling    Swelling is of the lips   Amoxicillin Rash    Has patient had a  PCN reaction causing immediate rash, facial/tongue/throat swelling, SOB or lightheadedness with hypotension: no Has patient had a PCN reaction causing severe rash involving mucus membranes or skin necrosis: pt had rash -- non severe  Has patient had a PCN reaction that required hospitalization: no Has patient had a PCN reaction occurring within the last 10 years: no If all of the above answers are "NO", then may proceed with Cephalosporin use.    Family History  Problem Relation Age of Onset   Ulcerative colitis Mother    Hypertension Mother    Diabetes Father    Heart attack Father  Hypertension Father    Diabetes Sister    Thyroid disease Sister    Heart attack Maternal Grandmother    Stroke Maternal Grandmother    Breast cancer Neg Hx    PE: BP 120/84 (BP Location: Right Arm, Patient Position: Sitting, Cuff Size: Normal)   Pulse 85   Ht 5\' 3"  (1.6 m)   Wt 195 lb 9.6 oz (88.7 kg)   SpO2 99%   BMI 34.65 kg/m  Wt Readings from Last 3 Encounters:  02/27/22 195 lb 9.6 oz (88.7 kg)  02/27/22 195 lb 11.2 oz (88.8 kg)  01/16/22 195 lb 3.2 oz (88.5 kg)   Constitutional: overweight, in NAD Eyes:  EOMI, no exophthalmos, no lid lag, no stare ENT: no neck masses, no thyromegaly, no cervical lymphadenopathy Cardiovascular: RRR, No MRG Respiratory: CTA B Musculoskeletal: no deformities Skin:no rashes Neurological: no tremor with outstretched hands  ASSESSMENT: 1. Thyrotoxicosis  2.  Thyroid nodule  PLAN:  1. Patient with a history of mild thyrotoxicosis dating back to 2009 per review of the chart, with intermittently normal results since.  She mentions that she was advised about the diagnosis of hyperthyroidism approximately 4 years ago during pregnancy, but she was asymptomatic.  She was started on methimazole at that time.  She continues on 5 mg daily. -She denies thyrotoxic sxs: weight loss,  hyperdefecation, palpitations, anxiety.  She does have hot flashes, possibly  related to perimenopause. - she does not appear to have exogenous causes for  low TSH.  - possible causes of thyrotoxicosis are:  Graves ds   Thyroiditis toxic multinodular goiter/ toxic adenoma - will check the TSH, fT3 and fT4 and also add thyroid stimulating antibodies to screen for Graves' disease.  - If the tests return abnormal, we may need an uptake and scan to differentiate between the 3 above possible etiologies  - we discussed about possible modalities of treatment for the above conditions, to include methimazole use, radioactive iodine ablation or (last resort) surgery.  Since she responded well to methimazole and she requires a low-dose, we can continue on this.  She agrees. - I do not feel that we need to add beta blockers at this time, since she is not tachycardic or tremulous - it is unclear if she has Graves' ophthalmopathy: she does not have any double vision, eye pain, but she does mention blurry vision and chemosis.  She sees Dr. 2010 and has an appointment coming up.  I advised her to let him know about the TSI antibody titer, when she goes to see him. - RTC in 1 year, but likely sooner for repeat labs  2. Thyroid nodule -She has a history of a subcentimeter L thyroid nodule incidentally seen on CT scan of the neck in 2014 -She had a repeat CT scan of the neck on 01/14/2020 and this showed stability of the nodule, which did not need meet criteria for further ultrasound investigation. - no neck compression sxs - no nodules felt on palpation of the neck today -Will continue to follow her expectantly for this.  Orders Placed This Encounter  Procedures   TSH   T4, free   T3, free   Thyroid stimulating immunoglobulin   13/09/2019, MD PhD [facility]

## 2022-02-27 NOTE — Progress Notes (Signed)
Patient ID: Sue [name], female   DOB: [birth year], 46 y.o.   MRN: [id number]  Chief Complaint  Patient presents with   Follow-up    HPI Sue [name] is a 46 y.o. female.  Complains of heavy, painful periods, hot flashes and decreased  libido. HPI  Past Medical History:  Diagnosis Date   Asthma    mild by PFT's 03/16/1998   Cervical lymphadenopathy    biopsy negative( [facility])   Chronic hypertension    Complete spontaneous abortion    x2   GERD (gastroesophageal reflux disease)    History of abnormal Pap smear    s/p cryo( Dr. 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    Vaginal Pap smear, abnormal    Vitamin B12 deficiency     Past Surgical History:  Procedure Laterality Date   biopsy of lymph node in R neck  2007.   LAPAROSCOPIC APPENDECTOMY N/A 05/08/2019   Procedure: APPENDECTOMY LAPAROSCOPIC WITH DIAGNOSTIC LAPAROSCOPY;  Surgeon: [name], MD;  Location: [facility];  Service: General;  Laterality: N/A;   LEEP     TONSILLECTOMY Bilateral 04/26/2020   Procedure: TONSILLECTOMY;  Surgeon: Laren Boom, DO;  Location: [facility];  Service: ENT;  Laterality: Bilateral;   WISDOM TOOTH EXTRACTION      Family History  Problem Relation Age of Onset   Ulcerative colitis Mother    Hypertension Mother    Diabetes Father    Heart attack Father    Hypertension Father    Diabetes Sister    Thyroid disease Sister    Heart attack Maternal Grandmother    Stroke Maternal Grandmother    Breast cancer Neg Hx     Social History Social History   Tobacco Use   Smoking status: Never    Smokeless tobacco: Never  Vaping Use   Vaping Use: Never used  Substance Use Topics   Alcohol use: Yes    Alcohol/week: 0.0 standard drinks of alcohol    Comment: occasional beer/wine   Drug use: No    Allergies  Allergen Reactions   Ciprofloxacin Anaphylaxis and Swelling   Clindamycin/Lincomycin Anaphylaxis and Swelling   Sulfamethoxazole-Trimethoprim Swelling    Swelling is of the lips   Amoxicillin Rash    Has patient had a PCN reaction causing immediate rash, facial/tongue/throat swelling, SOB or lightheadedness with hypotension: no Has patient had a PCN reaction causing severe rash involving mucus membranes or skin necrosis: pt had rash -- non severe  Has patient had a PCN reaction that required hospitalization: no Has patient had a PCN reaction occurring within the last 10 years: no If all of the above answers are "NO", then may proceed with Cephalosporin use.     Current Outpatient Medications  Medication Sig Dispense Refill   acetaminophen (TYLENOL) 500 MG tablet Take 1 tablet (500 mg total) by mouth every 8 (eight) hours as needed for mild pain, moderate pain or headache. 30 tablet 0   albuterol (VENTOLIN HFA) 108 (90 Base) MCG/ACT inhaler Inhale 1-2 puffs into the lungs every 6 (six) hours  as needed for wheezing or shortness of breath. 8 g 3   amLODipine (NORVASC) 10 MG tablet Take 1 tablet (10 mg total) by mouth at bedtime. 90 tablet 3   calcium carbonate (TUMS - DOSED IN MG ELEMENTAL CALCIUM) 500 MG chewable tablet Chew 1-2 tablets by mouth 3 (three) times daily as needed for indigestion or heartburn.     cyanocobalamin (,VITAMIN B-12,) 1000 MCG/ML injection Inject 1 mL (1,000 mcg total) into the muscle every 30 (thirty) days. 10 mL 3   inFLIXimab (REMICADE IV) Inject into the vein. (Patient not taking: Reported on 01/16/2022)     mesalamine (CANASA) 1000 MG suppository Place 1,000 mg rectally at bedtime as needed (ulcerative proctitis).     methimazole (TAPAZOLE) 5 MG  tablet Take 1 tablet (5 mg total) by mouth daily. 90 tablet 3   Multiple Vitamin (MULTIVITAMIN WITH MINERALS) TABS tablet Take 1 tablet by mouth daily.     norethindrone-ethinyl estradiol 1/35 (NORTREL 1/35, 28,) tablet Take 1 tablet by mouth at bedtime. 28 tablet 2   valACYclovir (VALTREX) 500 MG tablet Take 1 tablet bid x 3 days prn outbreaks. (Patient taking differently: Take 500 mg by mouth See admin instructions. Take 1 tablet (500 mg) by mouth twice daily for 3 days as needed for breakouts) 30 tablet 11   No current facility-administered medications for this visit.     US PELVIC COMPLETE WITH TRANSVAGINAL (Accession [id number]) (Order [id number]) Imaging Date: 02/12/2022 Department: [facility] Released By: Guadalupe Maple Authorizing: Leftwich-Kirby, Wilmer Floor, CNM   Exam Status  Status  Final [99]   PACS Intelerad Image Link   Show images for US PELVIC COMPLETE WITH TRANSVAGINAL Study Result  Narrative & Impression  CLINICAL DATA:  Dysmenorrhea   EXAM: TRANSABDOMINAL AND TRANSVAGINAL ULTRASOUND OF PELVIS   TECHNIQUE: Both transabdominal and transvaginal ultrasound examinations of the pelvis were performed. Transabdominal technique was performed for global imaging of the pelvis including uterus, ovaries, adnexal regions, and pelvic cul-de-sac. It was necessary to proceed with endovaginal exam following the transabdominal exam to visualize the uterus endometrium ovaries.   COMPARISON:  Pelvic ultrasound 02/27/2019   FINDINGS: Uterus   Measurements: 7.9 x 4.6 x 7.7 cm = volume: 145.8 mL. No fibroids or other mass visualized.   Endometrium   Thickness: 3.1 mm.  Trace fluid in the endometrial canal   Right ovary   Measurements: 3.2 x 2.1 x 2.9 cm = volume: 10.1 mL. Normal appearance/no adnexal mass.   Left ovary   Measurements: 2.7 x 2.2 x 2.3 cm = volume: 7.2 mL. Normal appearance/no adnexal mass.   Other findings    No abnormal free fluid.   IMPRESSION: Trace fluid in the endometrial canal. Otherwise negative pelvic ultrasound.     Electronically Signed   By: Jasmine Pang M.D.   On: 02/12/2022 20:20        Review of Systems Review of Systems Constitutional: negative for fatigue and weight loss Respiratory: negative for cough and wheezing Cardiovascular: negative for chest pain, fatigue and palpitations Gastrointestinal: negative for abdominal pain and change in bowel habits Genitourinary:positive for dysmenorrhea and hot flashes Integument/breast: negative for nipple discharge Musculoskeletal:negative for myalgias Neurological: negative for gait problems and tremors Behavioral/Psych: negative for abusive relationship, depression Endocrine: negative for temperature intolerance      Blood pressure 126/83, pulse 83, weight 195 lb 11.2 oz (88.8 kg).  Physical Exam Physical Exam General:   Alert and no distress  Skin:   no rash  or abnormalities  Lungs:   clear to auscultation bilaterally  Heart:   regular rate and rhythm, S1, S2 normal, no murmur, click, rub or gallop  Pelvic Exam:  Deferred    I have spent a total of 15 minutes of face-to-face time, excluding clinical staff time, reviewing notes and preparing to see patient, ordering tests and/or medications, and counseling the patient.   Data Reviewed Ultrasound Labs  Assessment     1. Abnormal uterine bleeding (AUB) Rx: - norethindrone-ethinyl estradiol 1/35 (NORTREL 1/35, 28,) tablet; Take 1 tablet by mouth at bedtime.  Dispense: 28 tablet; Refill: 11  2. Dysmenorrhea - Tylenol prn  3. Hot flashes - will try some natural products, i.e. black cohosh  4. Decreased libido without sexual dysfunction - same as above      Plan   Follow up in 3 months   [place] A. Clearance Coots MD 02/27/2022

## 2022-02-27 NOTE — Patient Instructions (Signed)
Please continue Methimazole 5 mg daily.  Please stop at the lab.  You should have an endocrinology follow-up appointment in 1 year.  

## 2022-03-04 ENCOUNTER — Other Ambulatory Visit: Payer: Self-pay

## 2022-03-04 DIAGNOSIS — I10 Essential (primary) hypertension: Secondary | ICD-10-CM

## 2022-03-04 LAB — TSH: TSH: 0.79 mIU/L

## 2022-03-04 LAB — THYROID STIMULATING IMMUNOGLOBULIN: TSI: <89 % baseline (ref <140)

## 2022-03-04 LAB — T3, FREE: T3, Free: 3.2 pg/mL (ref 2.3–4.2)

## 2022-03-04 LAB — T4, FREE: Free T4: 1.1 ng/dL (ref 0.8–1.8)

## 2022-03-04 MED ORDER — AMLODIPINE BESYLATE 10 MG PO TABS: 10.0000 mg | ORAL_TABLET | Freq: Every day | ORAL | 3 refills | Status: DC

## 2022-03-04 NOTE — Telephone Encounter (Signed)
First request for Amlodipine refill was 02/19/22.

## 2022-03-04 NOTE — Telephone Encounter (Signed)
Requesting amlodipine to be filled by today, pt [place] using [facility] on [address].  States she is completely out of med.

## 2022-03-20 DIAGNOSIS — L720 Epidermal cyst: Secondary | ICD-10-CM | POA: Diagnosis not present

## 2022-03-27 ENCOUNTER — Other Ambulatory Visit: Payer: Self-pay | Admitting: Internal Medicine

## 2022-03-27 DIAGNOSIS — E538 Deficiency of other specified B group vitamins: Secondary | ICD-10-CM

## 2022-03-31 DIAGNOSIS — R519 Headache, unspecified: Secondary | ICD-10-CM | POA: Diagnosis not present

## 2022-03-31 DIAGNOSIS — R059 Cough, unspecified: Secondary | ICD-10-CM | POA: Diagnosis not present

## 2022-03-31 DIAGNOSIS — Z20822 Contact with and (suspected) exposure to covid-19: Secondary | ICD-10-CM | POA: Diagnosis not present

## 2022-03-31 DIAGNOSIS — J101 Influenza due to other identified influenza virus with other respiratory manifestations: Secondary | ICD-10-CM | POA: Diagnosis not present

## 2022-04-06 DIAGNOSIS — Z79620 Long term (current) use of immunosuppressive biologic: Secondary | ICD-10-CM | POA: Diagnosis not present

## 2022-04-06 DIAGNOSIS — K51219 Ulcerative (chronic) proctitis with unspecified complications: Secondary | ICD-10-CM | POA: Diagnosis not present

## 2022-04-06 DIAGNOSIS — Z5181 Encounter for therapeutic drug level monitoring: Secondary | ICD-10-CM | POA: Diagnosis not present

## 2022-04-06 DIAGNOSIS — M129 Arthropathy, unspecified: Secondary | ICD-10-CM | POA: Diagnosis not present

## 2022-04-06 DIAGNOSIS — K529 Noninfective gastroenteritis and colitis, unspecified: Secondary | ICD-10-CM | POA: Diagnosis not present

## 2022-04-06 DIAGNOSIS — E538 Deficiency of other specified B group vitamins: Secondary | ICD-10-CM | POA: Diagnosis not present

## 2022-04-17 DIAGNOSIS — R7989 Other specified abnormal findings of blood chemistry: Secondary | ICD-10-CM | POA: Diagnosis not present

## 2022-05-08 DIAGNOSIS — R7989 Other specified abnormal findings of blood chemistry: Secondary | ICD-10-CM | POA: Diagnosis not present

## 2022-05-22 DIAGNOSIS — R7401 Elevation of levels of liver transaminase levels: Secondary | ICD-10-CM | POA: Diagnosis not present

## 2022-05-22 DIAGNOSIS — K219 Gastro-esophageal reflux disease without esophagitis: Secondary | ICD-10-CM | POA: Diagnosis not present

## 2022-05-22 DIAGNOSIS — K760 Fatty (change of) liver, not elsewhere classified: Secondary | ICD-10-CM | POA: Diagnosis not present

## 2022-07-08 DIAGNOSIS — K2970 Gastritis, unspecified, without bleeding: Secondary | ICD-10-CM | POA: Diagnosis not present

## 2022-07-08 DIAGNOSIS — K219 Gastro-esophageal reflux disease without esophagitis: Secondary | ICD-10-CM | POA: Diagnosis not present

## 2022-09-29 ENCOUNTER — Other Ambulatory Visit: Payer: Self-pay

## 2022-09-29 DIAGNOSIS — A6009 Herpesviral infection of other urogenital tract: Secondary | ICD-10-CM

## 2022-09-29 MED ORDER — VALACYCLOVIR HCL 500 MG PO TABS: ORAL_TABLET | ORAL | 6 refills | Status: DC

## 2022-10-06 DIAGNOSIS — R194 Change in bowel habit: Secondary | ICD-10-CM | POA: Diagnosis not present

## 2022-10-06 DIAGNOSIS — R1084 Generalized abdominal pain: Secondary | ICD-10-CM | POA: Diagnosis not present

## 2022-10-07 ENCOUNTER — Other Ambulatory Visit: Payer: Self-pay | Admitting: Gastroenterology

## 2022-10-07 ENCOUNTER — Ambulatory Visit: Admission: RE | Admit: 2022-10-07 | Payer: Medicaid Other | Source: Ambulatory Visit

## 2022-10-07 DIAGNOSIS — R1032 Left lower quadrant pain: Secondary | ICD-10-CM | POA: Diagnosis not present

## 2022-10-07 DIAGNOSIS — K5900 Constipation, unspecified: Secondary | ICD-10-CM | POA: Diagnosis not present

## 2022-10-07 DIAGNOSIS — R1084 Generalized abdominal pain: Secondary | ICD-10-CM

## 2022-10-22 DIAGNOSIS — Z79899 Other long term (current) drug therapy: Secondary | ICD-10-CM | POA: Diagnosis not present

## 2022-10-22 DIAGNOSIS — M129 Arthropathy, unspecified: Secondary | ICD-10-CM | POA: Diagnosis not present

## 2022-10-22 DIAGNOSIS — K529 Noninfective gastroenteritis and colitis, unspecified: Secondary | ICD-10-CM | POA: Diagnosis not present

## 2022-10-22 DIAGNOSIS — L409 Psoriasis, unspecified: Secondary | ICD-10-CM | POA: Diagnosis not present

## 2022-10-26 DIAGNOSIS — K429 Umbilical hernia without obstruction or gangrene: Secondary | ICD-10-CM | POA: Diagnosis not present

## 2022-10-28 ENCOUNTER — Ambulatory Visit: Payer: Self-pay | Admitting: General Surgery

## 2022-11-02 NOTE — Progress Notes (Signed)
COVID Vaccine received:  []  No [x]  Yes Date of any COVID positive Test in last 90 days: No PCP - Kathleen Lime MD Cardiologist -   Chest x-ray - 12/10/20 Epic EKG -   Stress Test -  ECHO -  Cardiac Cath -   Bowel Prep - [x]  No  []   Yes ______  Pacemaker / ICD device [x]  No []  Yes   Spinal Cord Stimulator:[x]  No []  Yes       History of Sleep Apnea? [x]  No []  Yes   CPAP used?- [x]  No []  Yes    Does the patient monitor blood sugar?          [x]  No []  Yes  []  N/A  Patient has: [x]  NO Hx DM   []  Pre-DM                 []  DM1  []   DM2 Does patient have a [employer] or Dexacom? []  No []  Yes   Fasting Blood Sugar Ranges-  Checks Blood Sugar _____ times a day  GLP1 agonist / usual dose - no GLP1 instructions:  SGLT-2 inhibitors / usual dose - no SGLT-2 instructions:   Blood Thinner / Instructions:no Aspirin Instructions:no Comments:   Activity level: Patient is able to climb a flight of stairs without difficulty; [x]  No CP  [x]  No SOB.  Patient can perform ADLs without assistance.   Anesthesia review:   Patient denies shortness of breath, fever, cough and chest pain at PAT appointment.  Patient verbalized understanding and agreement to the Pre-Surgical Instructions that were given to them at this PAT appointment. Patient was also educated of the need to review these PAT instructions again prior to his/her surgery.I reviewed the appropriate phone numbers to call if they have any and questions or concerns.

## 2022-11-03 NOTE — Patient Instructions (Signed)
SURGICAL WAITING ROOM VISITATION  Patients having surgery or a procedure may have no more than 2 support people in the waiting area - these visitors may rotate.    Children under the age of 54 must have an adult with them who is not the patient.  Due to an increase in RSV and influenza rates and associated hospitalizations, children ages 19 and under may not visit patients in [facility] hospitals.  If the patient needs to stay at the hospital during part of their recovery, the visitor guidelines for inpatient rooms apply. Pre-op nurse will coordinate an appropriate time for 1 support person to accompany patient in pre-op.  This support person may not rotate.    Please refer to the [facility] website for the visitor guidelines for Inpatients (after your surgery is over and you are in a regular room).       Your procedure is scheduled on: 11/06/22   Report to [facility] Main Entrance    Report to admitting at 5:15 AM   Call this number if you have problems the morning of surgery [phone]    Oral Hygiene is also important to reduce your risk of infection.                                    Remember - BRUSH YOUR TEETH THE MORNING OF SURGERY WITH YOUR REGULAR TOOTHPASTE   Stop all vitamins and herbal supplements 7 days before surgery.   Take these medicines the morning of surgery with A SIP OF WATER:  Amlodipine, tylenol if needed             You may not have any metal on your body including hair pins, jewelry, and body piercing             Do not wear make-up, lotions, powders, perfumes/cologne, or deodorant  Do not wear nail polish including gel and S&S, artificial/acrylic nails, or any other type of covering on natural nails including finger and toenails. If you have artificial nails, gel coating, etc. that needs to be removed by a nail salon please have this removed prior to surgery or surgery may need to be canceled/ delayed if the surgeon/ anesthesia feels like they  are unable to be safely monitored.   Do not shave  48 hours prior to surgery.    Do not bring valuables to the hospital. [facility] IS NOT             RESPONSIBLE   FOR VALUABLES.   Contacts, glasses, dentures or bridgework may not be worn into surgery.  DO NOT BRING YOUR HOME MEDICATIONS TO THE HOSPITAL. PHARMACY WILL DISPENSE MEDICATIONS LISTED ON YOUR MEDICATION LIST TO YOU DURING YOUR ADMISSION IN THE HOSPITAL!    Patients discharged on the day of surgery will not be allowed to drive home.  Someone NEEDS to stay with you for the first 24 hours after anesthesia.   Special Instructions: Bring a copy of your healthcare power of attorney and living will documents the day of surgery if you haven't scanned them before.              Please read over the following fact sheets you were given: IF YOU HAVE QUESTIONS ABOUT YOUR PRE-OP INSTRUCTIONS PLEASE CALL [phone] Rosey Bath   If you received a COVID test during your pre-op visit  it is requested that you wear a mask when  out in public, stay away from anyone that may not be feeling well and notify your surgeon if you develop symptoms. If you test positive for Covid or have been in contact with anyone that has tested positive in the last 10 days please notify you surgeon.    [place] - Preparing for Surgery Before surgery, you can play an important role.  Because skin is not sterile, your skin needs to be as free of germs as possible.  You can reduce the number of germs on your skin by washing with CHG (chlorahexidine gluconate) soap before surgery.  CHG is an antiseptic cleaner which kills germs and bonds with the skin to continue killing germs even after washing. Please DO NOT use if you have an allergy to CHG or antibacterial soaps.  If your skin becomes reddened/irritated stop using the CHG and inform your nurse when you arrive at Short Stay. Do not shave (including legs and underarms) for at least 48 hours prior to the first CHG shower.   You may shave your face/neck.  Please follow these instructions carefully:  1.  Shower with CHG Soap the night before surgery and the  morning of surgery.  2.  If you choose to wash your hair, wash your hair first as usual with your normal  shampoo.  3.  After you shampoo, rinse your hair and body thoroughly to remove the shampoo.                             4.  Use CHG as you would any other liquid soap.  You can apply chg directly to the skin and wash.  Gently with a scrungie or clean washcloth.  5.  Apply the CHG Soap to your body ONLY FROM THE NECK DOWN.   Do   not use on face/ open                           Wound or open sores. Avoid contact with eyes, ears mouth and   genitals (private parts).                       Wash face,  Genitals (private parts) with your normal soap.             6.  Wash thoroughly, paying special attention to the area where your    surgery  will be performed.  7.  Thoroughly rinse your body with warm water from the neck down.  8.  DO NOT shower/wash with your normal soap after using and rinsing off the CHG Soap.                9.  Pat yourself dry with a clean towel.            10.  Wear clean pajamas.            11.  Place clean sheets on your bed the night of your first shower and do not  sleep with pets. Day of Surgery : Do not apply any lotions/deodorants the morning of surgery.  Please wear clean clothes to the hospital/surgery center.  FAILURE TO FOLLOW THESE INSTRUCTIONS MAY RESULT IN THE CANCELLATION OF YOUR SURGERY  PATIENT SIGNATURE_________________________________  NURSE SIGNATURE__________________________________  ________________________________________________________________________

## 2022-11-05 ENCOUNTER — Encounter (HOSPITAL_COMMUNITY): Payer: Self-pay

## 2022-11-05 ENCOUNTER — Encounter (HOSPITAL_COMMUNITY)
Admission: RE | Admit: 2022-11-05 | Discharge: 2022-11-05 | Disposition: A | Discharge status: Home / Self Care | Payer: Medicaid Other | Source: Ambulatory Visit | Attending: General Surgery | Admitting: General Surgery

## 2022-11-05 ENCOUNTER — Other Ambulatory Visit: Payer: Self-pay

## 2022-11-05 ENCOUNTER — Ambulatory Visit: Payer: Self-pay | Admitting: General Surgery

## 2022-11-05 VITALS — BP 143/92 | HR 86 | Resp 16 | Ht 64.0 in | Wt 195.0 lb

## 2022-11-05 DIAGNOSIS — Z01818 Encounter for other preprocedural examination: Secondary | ICD-10-CM | POA: Diagnosis present

## 2022-11-05 DIAGNOSIS — R9431 Abnormal electrocardiogram [ECG] [EKG]: Secondary | ICD-10-CM | POA: Diagnosis not present

## 2022-11-05 DIAGNOSIS — Z01810 Encounter for preprocedural cardiovascular examination: Secondary | ICD-10-CM | POA: Insufficient documentation

## 2022-11-05 DIAGNOSIS — Z01812 Encounter for preprocedural laboratory examination: Secondary | ICD-10-CM | POA: Insufficient documentation

## 2022-11-05 DIAGNOSIS — I10 Essential (primary) hypertension: Secondary | ICD-10-CM | POA: Insufficient documentation

## 2022-11-05 HISTORY — DX: Headache, unspecified: R51.9

## 2022-11-05 LAB — BASIC METABOLIC PANEL
Anion gap: 11 (ref 5–15)
BUN: 11 mg/dL (ref 6–20)
CO2: 23 mmol/L (ref 22–32)
Calcium: 9.2 mg/dL (ref 8.9–10.3)
Chloride: 104 mmol/L (ref 98–111)
Creatinine, Ser: 0.97 mg/dL (ref 0.44–1.00)
GFR, Estimated: >60 mL/min (ref >60)
Glucose, Bld: 114 mg/dL — ABNORMAL HIGH (ref 70–99)
Potassium: 4.1 mmol/L (ref 3.5–5.1)
Sodium: 138 mmol/L (ref 135–145)

## 2022-11-05 NOTE — Anesthesia Preprocedure Evaluation (Addendum)
Anesthesia Evaluation  Patient identified by MRN, date of birth, ID band Patient awake    Reviewed: Allergy & Precautions, NPO status , Patient's Chart, lab work & pertinent test results  History of Anesthesia Complications Negative for: history of anesthetic complications  Airway Mallampati: II  TM Distance: >3 FB Neck ROM: Full    Dental  (+) Missing,    Pulmonary asthma    Pulmonary exam normal        Cardiovascular hypertension, Pt. on medications Normal cardiovascular exam     Neuro/Psych  Headaches    GI/Hepatic Neg liver ROS, hiatal hernia, PUD,GERD  Controlled,,Umbilical hernia   Endo/Other  Hypothyroidism    Renal/GU negative Renal ROS     Musculoskeletal  (+) Arthritis , Rheumatoid disorders,    Abdominal   Peds  Hematology negative hematology ROS (+)   Anesthesia Other Findings Day of surgery medications reviewed with patient.  Reproductive/Obstetrics                              Anesthesia Physical Anesthesia Plan  ASA: 2  Anesthesia Plan: General   Post-op Pain Management: Tylenol PO (pre-op)*   Induction: Intravenous  PONV Risk Score and Plan: 3 and Treatment may vary due to age or medical condition, Ondansetron, Dexamethasone and Midazolam  Airway Management Planned: Oral ETT  Additional Equipment: None  Intra-op Plan:   Post-operative Plan: Extubation in OR  Informed Consent: I have reviewed the patients History and Physical, chart, labs and discussed the procedure including the risks, benefits and alternatives for the proposed anesthesia with the patient or authorized representative who has indicated his/her understanding and acceptance.     Dental advisory given  Plan Discussed with: CRNA  Anesthesia Plan Comments:         Anesthesia Quick Evaluation

## 2022-11-06 ENCOUNTER — Encounter (HOSPITAL_COMMUNITY): Payer: Self-pay | Admitting: General Surgery

## 2022-11-06 ENCOUNTER — Ambulatory Visit (HOSPITAL_COMMUNITY): Payer: Medicaid Other | Admitting: Certified Registered Nurse Anesthetist

## 2022-11-06 ENCOUNTER — Ambulatory Visit (HOSPITAL_BASED_OUTPATIENT_CLINIC_OR_DEPARTMENT_OTHER): Payer: Medicaid Other | Admitting: Certified Registered Nurse Anesthetist

## 2022-11-06 ENCOUNTER — Encounter (HOSPITAL_COMMUNITY)
Admission: RE | Disposition: A | Discharge status: Home / Self Care | Payer: Self-pay | Source: Home / Self Care | Attending: General Surgery

## 2022-11-06 ENCOUNTER — Ambulatory Visit (HOSPITAL_COMMUNITY)
Admission: RE | Admit: 2022-11-06 | Discharge: 2022-11-06 | Disposition: A | Discharge status: Home / Self Care | Payer: Medicaid Other | Attending: General Surgery | Admitting: General Surgery

## 2022-11-06 ENCOUNTER — Other Ambulatory Visit: Payer: Self-pay

## 2022-11-06 DIAGNOSIS — K432 Incisional hernia without obstruction or gangrene: Secondary | ICD-10-CM | POA: Diagnosis not present

## 2022-11-06 DIAGNOSIS — K429 Umbilical hernia without obstruction or gangrene: Secondary | ICD-10-CM | POA: Diagnosis not present

## 2022-11-06 DIAGNOSIS — D494 Neoplasm of unspecified behavior of bladder: Secondary | ICD-10-CM | POA: Diagnosis not present

## 2022-11-06 DIAGNOSIS — Z79899 Other long term (current) drug therapy: Secondary | ICD-10-CM | POA: Diagnosis not present

## 2022-11-06 DIAGNOSIS — J45909 Unspecified asthma, uncomplicated: Secondary | ICD-10-CM | POA: Insufficient documentation

## 2022-11-06 DIAGNOSIS — I10 Essential (primary) hypertension: Secondary | ICD-10-CM | POA: Diagnosis not present

## 2022-11-06 DIAGNOSIS — M069 Rheumatoid arthritis, unspecified: Secondary | ICD-10-CM | POA: Diagnosis not present

## 2022-11-06 HISTORY — PX: UMBILICAL HERNIA REPAIR: SHX196

## 2022-11-06 LAB — POCT PREGNANCY, URINE: Preg Test, Ur: NEGATIVE

## 2022-11-06 SURGERY — REPAIR, HERNIA, UMBILICAL, ADULT
Anesthesia: General

## 2022-11-06 MED ORDER — ROCURONIUM BROMIDE 10 MG/ML (PF) SYRINGE
PREFILLED_SYRINGE | INTRAVENOUS | Status: AC
  Filled 2022-11-06: qty 10

## 2022-11-06 MED ORDER — FENTANYL CITRATE (PF) 100 MCG/2ML IJ SOLN
INTRAMUSCULAR | Status: AC
  Filled 2022-11-06: qty 2

## 2022-11-06 MED ORDER — PROPOFOL 10 MG/ML IV BOLUS
INTRAVENOUS | Status: AC
  Filled 2022-11-06: qty 20

## 2022-11-06 MED ORDER — ROCURONIUM BROMIDE 10 MG/ML (PF) SYRINGE
PREFILLED_SYRINGE | INTRAVENOUS | Status: DC | PRN
  Administered 2022-11-06: 60 mg via INTRAVENOUS

## 2022-11-06 MED ORDER — CHLORHEXIDINE GLUCONATE CLOTH 2 % EX PADS: 6.0000 | MEDICATED_PAD | Freq: Once | CUTANEOUS | Status: DC

## 2022-11-06 MED ORDER — PROPOFOL 500 MG/50ML IV EMUL
INTRAVENOUS | Status: DC | PRN
  Administered 2022-11-06: 50 ug/kg/min via INTRAVENOUS

## 2022-11-06 MED ORDER — DEXAMETHASONE SODIUM PHOSPHATE 4 MG/ML IJ SOLN
INTRAMUSCULAR | Status: DC | PRN
  Administered 2022-11-06: 5 mg via INTRAVENOUS

## 2022-11-06 MED ORDER — ACETAMINOPHEN 325 MG PO TABS: 650.0000 mg | ORAL_TABLET | Freq: Four times a day (QID) | ORAL | 0 refills | Status: AC

## 2022-11-06 MED ORDER — ONDANSETRON HCL 4 MG/2ML IJ SOLN
INTRAMUSCULAR | Status: DC | PRN
  Administered 2022-11-06: 4 mg via INTRAVENOUS

## 2022-11-06 MED ORDER — BUPIVACAINE-EPINEPHRINE 0.25% -1:200000 IJ SOLN
INTRAMUSCULAR | Status: AC
  Filled 2022-11-06: qty 1

## 2022-11-06 MED ORDER — DEXAMETHASONE SODIUM PHOSPHATE 10 MG/ML IJ SOLN
INTRAMUSCULAR | Status: AC
  Filled 2022-11-06: qty 1

## 2022-11-06 MED ORDER — ORAL CARE MOUTH RINSE: 15.0000 mL | Freq: Once | OROMUCOSAL | Status: AC

## 2022-11-06 MED ORDER — MIDAZOLAM HCL 2 MG/2ML IJ SOLN
INTRAMUSCULAR | Status: AC
  Filled 2022-11-06: qty 2

## 2022-11-06 MED ORDER — PROPOFOL 10 MG/ML IV BOLUS
INTRAVENOUS | Status: DC | PRN
  Administered 2022-11-06: 200 mg via INTRAVENOUS

## 2022-11-06 MED ORDER — LIDOCAINE HCL (PF) 2 % IJ SOLN
INTRAMUSCULAR | Status: AC
  Filled 2022-11-06: qty 5

## 2022-11-06 MED ORDER — MIDAZOLAM HCL 5 MG/5ML IJ SOLN
INTRAMUSCULAR | Status: DC | PRN
  Administered 2022-11-06: 2 mg via INTRAVENOUS

## 2022-11-06 MED ORDER — LACTATED RINGERS IV SOLN: INTRAVENOUS | Status: DC

## 2022-11-06 MED ORDER — SCOPOLAMINE 1 MG/3DAYS TD PT72
1.0000 | MEDICATED_PATCH | Freq: Once | TRANSDERMAL | Status: DC
  Administered 2022-11-06: 1.5 mg via TRANSDERMAL
  Filled 2022-11-06: qty 1

## 2022-11-06 MED ORDER — FENTANYL CITRATE PF 50 MCG/ML IJ SOSY
PREFILLED_SYRINGE | INTRAMUSCULAR | Status: AC
  Filled 2022-11-06: qty 2

## 2022-11-06 MED ORDER — ONDANSETRON HCL 4 MG/2ML IJ SOLN
INTRAMUSCULAR | Status: AC
  Filled 2022-11-06: qty 2

## 2022-11-06 MED ORDER — DROPERIDOL 2.5 MG/ML IJ SOLN
0.6250 mg | Freq: Once | INTRAMUSCULAR | Status: AC | PRN
  Administered 2022-11-06: 0.625 mg via INTRAVENOUS

## 2022-11-06 MED ORDER — BUPIVACAINE LIPOSOME 1.3 % IJ SUSP
INTRAMUSCULAR | Status: DC | PRN
  Administered 2022-11-06: 20 mL

## 2022-11-06 MED ORDER — OXYCODONE HCL 5 MG PO TABS: 5.0000 mg | ORAL_TABLET | Freq: Once | ORAL | Status: DC | PRN

## 2022-11-06 MED ORDER — BUPIVACAINE LIPOSOME 1.3 % IJ SUSP
INTRAMUSCULAR | Status: AC
  Filled 2022-11-06: qty 20

## 2022-11-06 MED ORDER — CEFAZOLIN SODIUM-DEXTROSE 2-4 GM/100ML-% IV SOLN
2.0000 g | INTRAVENOUS | Status: AC
  Administered 2022-11-06: 2 g via INTRAVENOUS
  Filled 2022-11-06: qty 100

## 2022-11-06 MED ORDER — OXYCODONE HCL 5 MG PO TABS: 5.0000 mg | ORAL_TABLET | Freq: Three times a day (TID) | ORAL | 0 refills | Status: AC | PRN

## 2022-11-06 MED ORDER — ACETAMINOPHEN 500 MG PO TABS
1000.0000 mg | ORAL_TABLET | Freq: Once | ORAL | Status: AC
  Administered 2022-11-06: 1000 mg via ORAL
  Filled 2022-11-06: qty 2

## 2022-11-06 MED ORDER — LIDOCAINE 2% (20 MG/ML) 5 ML SYRINGE
INTRAMUSCULAR | Status: DC | PRN
  Administered 2022-11-06: 100 mg via INTRAVENOUS

## 2022-11-06 MED ORDER — CHLORHEXIDINE GLUCONATE 0.12 % MT SOLN
15.0000 mL | Freq: Once | OROMUCOSAL | Status: AC
  Administered 2022-11-06: 15 mL via OROMUCOSAL

## 2022-11-06 MED ORDER — BUPIVACAINE LIPOSOME 1.3 % IJ SUSP: 20.0000 mL | Freq: Once | INTRAMUSCULAR | Status: DC

## 2022-11-06 MED ORDER — 0.9 % SODIUM CHLORIDE (POUR BTL) OPTIME
TOPICAL | Status: DC | PRN
  Administered 2022-11-06: 1000 mL

## 2022-11-06 MED ORDER — FENTANYL CITRATE (PF) 100 MCG/2ML IJ SOLN
INTRAMUSCULAR | Status: DC | PRN
  Administered 2022-11-06: 50 ug via INTRAVENOUS
  Administered 2022-11-06: 100 ug via INTRAVENOUS
  Administered 2022-11-06: 50 ug via INTRAVENOUS

## 2022-11-06 MED ORDER — DROPERIDOL 2.5 MG/ML IJ SOLN
INTRAMUSCULAR | Status: AC
  Filled 2022-11-06: qty 2

## 2022-11-06 MED ORDER — IBUPROFEN 200 MG PO TABS: 600.0000 mg | ORAL_TABLET | Freq: Four times a day (QID) | ORAL | 0 refills | Status: AC

## 2022-11-06 MED ORDER — OXYCODONE HCL 5 MG/5ML PO SOLN: 5.0000 mg | Freq: Once | ORAL | Status: DC | PRN

## 2022-11-06 MED ORDER — FENTANYL CITRATE PF 50 MCG/ML IJ SOSY
25.0000 ug | PREFILLED_SYRINGE | INTRAMUSCULAR | Status: DC | PRN
  Administered 2022-11-06: 50 ug via INTRAVENOUS
  Administered 2022-11-06 (×2): 25 ug via INTRAVENOUS

## 2022-11-06 MED ORDER — SUGAMMADEX SODIUM 200 MG/2ML IV SOLN
INTRAVENOUS | Status: DC | PRN
  Administered 2022-11-06: 200 mg via INTRAVENOUS

## 2022-11-06 SURGICAL SUPPLY — 36 items
ADH SKN CLS APL DERMABOND .7 (GAUZE/BANDAGES/DRESSINGS) ×1
APL PRP STRL LF DISP 70% ISPRP (MISCELLANEOUS) ×1
BAG COUNTER SPONGE SURGICOUNT (BAG) IMPLANT
BAG SPNG CNTER NS LX DISP (BAG)
BLADE SURG 15 STRL LF DISP TIS (BLADE) ×1 IMPLANT
BLADE SURG 15 STRL SS (BLADE) ×1
BLADE SURG SZ10 CARB STEEL (BLADE) IMPLANT
CHLORAPREP W/TINT 26 (MISCELLANEOUS) ×1 IMPLANT
COVER SURGICAL LIGHT HANDLE (MISCELLANEOUS) ×1 IMPLANT
DERMABOND ADVANCED .7 DNX12 (GAUZE/BANDAGES/DRESSINGS) ×1 IMPLANT
DRAPE LAPAROSCOPIC ABDOMINAL (DRAPES) ×1 IMPLANT
DRAPE UTILITY XL STRL (DRAPES) ×1 IMPLANT
ELECT PENCIL ROCKER SW 15FT (MISCELLANEOUS) ×1 IMPLANT
ELECT REM PT RETURN 15FT ADLT (MISCELLANEOUS) ×1 IMPLANT
GAUZE 4X4 16PLY ~~LOC~~+RFID DBL (SPONGE) IMPLANT
GLOVE BIO SURGEON STRL SZ7 (GLOVE) ×1 IMPLANT
GLOVE INDICATOR 7.5 STRL GRN (GLOVE) ×1 IMPLANT
GOWN STRL REUS W/ TWL XL LVL3 (GOWN DISPOSABLE) ×1 IMPLANT
GOWN STRL REUS W/TWL XL LVL3 (GOWN DISPOSABLE) ×1
KIT BASIN OR (CUSTOM PROCEDURE TRAY) ×1 IMPLANT
KIT TURNOVER KIT A (KITS) IMPLANT
MARKER SKIN DUAL TIP RULER LAB (MISCELLANEOUS) ×1 IMPLANT
NDL HYPO 25X1 1.5 SAFETY (NEEDLE) ×1 IMPLANT
NEEDLE HYPO 25X1 1.5 SAFETY (NEEDLE) ×1
PACK BASIC VI WITH GOWN DISP (CUSTOM PROCEDURE TRAY) ×1 IMPLANT
SPIKE FLUID TRANSFER (MISCELLANEOUS) ×1 IMPLANT
SPONGE T-LAP 18X18 ~~LOC~~+RFID (SPONGE) ×1 IMPLANT
SUT MNCRL AB 4-0 PS2 18 (SUTURE) ×1 IMPLANT
SUT NOVA NAB DX-16 0-1 5-0 T12 (SUTURE) IMPLANT
SUT PDS AB 1 CT1 27 (SUTURE) IMPLANT
SUT VIC AB 3-0 SH 18 (SUTURE) ×1 IMPLANT
SYR 20ML LL LF (SYRINGE) ×1 IMPLANT
SYR BULB IRRIG 60ML STRL (SYRINGE) IMPLANT
TOWEL OR 17X26 10 PK STRL BLUE (TOWEL DISPOSABLE) ×1 IMPLANT
TOWEL OR NON WOVEN STRL DISP B (DISPOSABLE) ×1 IMPLANT
YANKAUER SUCT BULB TIP 10FT TU (MISCELLANEOUS) ×1 IMPLANT

## 2022-11-06 NOTE — Anesthesia Postprocedure Evaluation (Signed)
Anesthesia Post Note  Patient: Sue [name]  Procedure(s) Performed: HERNIA REPAIR UMBILICAL ADULT     Patient location during evaluation: PACU Anesthesia Type: General Level of consciousness: awake Pain management: pain level controlled Vital Signs Assessment: post-procedure vital signs reviewed and stable Respiratory status: spontaneous breathing, nonlabored ventilation and respiratory function stable Cardiovascular status: blood pressure returned to baseline Postop Assessment: no apparent nausea or vomiting Anesthetic complications: no   No notable events documented.  Last Vitals:  Vitals:   11/06/22 0900 11/06/22 0915  BP: 112/77 111/78  Pulse: 81 81  Resp: 12 16  Temp:  (!) 36.4 C  SpO2: (!) 88% 93%    Last Pain:  Vitals:   11/06/22 0915  TempSrc:   PainSc: Asleep                 Shanda Howells

## 2022-11-06 NOTE — Anesthesia Procedure Notes (Addendum)
Procedure Name: Intubation Date/Time: 11/06/2022 7:25 AM  Performed by: Vanessa [place], CRNAPre-anesthesia Checklist: Patient identified, Emergency Drugs available, Suction available and Patient being monitored Patient Re-evaluated:Patient Re-evaluated prior to induction Oxygen Delivery Method: Circle system utilized Preoxygenation: Pre-oxygenation with 100% oxygen Induction Type: IV induction Ventilation: Mask ventilation without difficulty Laryngoscope Size: Miller and 2 Grade View: Grade II Tube type: Oral Tube size: 7.0 mm Number of attempts: 1 Airway Equipment and Method: Stylet and Oral airway Placement Confirmation: ETT inserted through vocal cords under direct vision, positive ETCO2 and breath sounds checked- equal and bilateral Secured at: 21 cm Tube secured with: Tape Dental Injury: Teeth and Oropharynx as per pre-operative assessment

## 2022-11-06 NOTE — Transfer of Care (Signed)
Immediate Anesthesia Transfer of Care Note  Patient: Sue [name]  Procedure(s) Performed: HERNIA REPAIR UMBILICAL ADULT  Patient Location: PACU  Anesthesia Type:General  Level of Consciousness: awake and patient cooperative  Airway & Oxygen Therapy: Patient Spontanous Breathing and Patient connected to face mask  Post-op Assessment: Report given to RN and Post -op Vital signs reviewed and stable  Post vital signs: Reviewed and stable  Last Vitals:  Vitals Value Taken Time  BP 134/90 11/06/22 0824  Temp    Pulse 90 11/06/22 0824  Resp 17 11/06/22 0824  SpO2 100 % 11/06/22 0824  Vitals shown include unfiled device data.  Last Pain:  Vitals:   11/06/22 0556  TempSrc:   PainSc: 0-No pain         Complications: No notable events documented.

## 2022-11-06 NOTE — H&P (Signed)
Sue [name] 11-06-1975  [id number].    HPI:  47 y/o F w/ a hx of lap appy who presents with a periumbilical hernia.  She denies any recent hospitalizations and reports that she is in her usual state of health.   ROS: Review of Systems  Constitutional: Negative.   HENT: Negative.    Eyes: Negative.   Respiratory: Negative.    Cardiovascular: Negative.   Gastrointestinal: Negative.   Genitourinary: Negative.   Musculoskeletal: Negative.   Skin: Negative.   Neurological: Negative.   Endo/Heme/Allergies: Negative.   Psychiatric/Behavioral: Negative.      Family History  Problem Relation Age of Onset   Ulcerative colitis Mother    Hypertension Mother    Diabetes Father    Heart attack Father    Hypertension Father    Diabetes Sister    Thyroid disease Sister    Heart attack Maternal Grandmother    Stroke Maternal Grandmother    Breast cancer Neg Hx     Past Medical History:  Diagnosis Date   Asthma    mild by PFT's 03/16/1998   Cervical lymphadenopathy    biopsy negativeMarvetta Gibbons)   Chronic hypertension    Complete spontaneous abortion    x2   GERD (gastroesophageal reflux disease)    Headache    History of abnormal Pap smear    s/p cryo( Dr. 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    Vaginal Pap smear, abnormal    Vitamin B12 deficiency     Past Surgical History:  Procedure Laterality Date   biopsy of lymph node in R neck  2007   CYST EXCISION     2024 Feb.   LAPAROSCOPIC APPENDECTOMY N/A 05/08/2019   Procedure: APPENDECTOMY LAPAROSCOPIC WITH DIAGNOSTIC  LAPAROSCOPY;  Surgeon: [name], MD;  Location: [facility];  Service: General;  Laterality: N/A;   LEEP     TONSILLECTOMY Bilateral 04/26/2020   Procedure: TONSILLECTOMY;  Surgeon: Laren Boom, DO;  Location: [facility] OR;  Service: ENT;  Laterality: Bilateral;   WISDOM TOOTH EXTRACTION      Social History:  reports that she has never smoked. She has never used smokeless tobacco. She reports current alcohol use. She reports that she does not use drugs.  Allergies:  Allergies  Allergen Reactions   Ciprofloxacin Anaphylaxis and Swelling   Clindamycin/Lincomycin Anaphylaxis and Swelling   Bactrim [Sulfamethoxazole-Trimethoprim] Swelling    Swelling is of the lips   Amoxicillin Rash    Has patient had a PCN reaction causing immediate rash, facial/tongue/throat swelling, SOB or lightheadedness with hypotension: no Has patient had a PCN reaction causing severe rash involving mucus membranes or skin necrosis: pt had rash -- non severe  Has patient had a PCN reaction that required hospitalization: no Has patient had a PCN reaction occurring within the last 10 years: no If all of the above answers are "NO", then may proceed with Cephalosporin use.     Medications Prior to Admission  Medication Sig Dispense Refill   acetaminophen (TYLENOL) 500 MG tablet Take  1 tablet (500 mg total) by mouth every 8 (eight) hours as needed for mild pain, moderate pain or headache. 30 tablet 0   albuterol (VENTOLIN HFA) 108 (90 Base) MCG/ACT inhaler Inhale 1-2 puffs into the lungs every 6 (six) hours as needed for wheezing or shortness of breath. 8 g 3   amLODipine (NORVASC) 10 MG tablet Take 1 tablet (10 mg total) by mouth at bedtime. 90 tablet 3   Brimonidine Tartrate (LUMIFY) 0.025 % SOLN Place 1 drop into both eyes as needed (irritation).     calcium carbonate (TUMS - DOSED IN MG ELEMENTAL CALCIUM) 500 MG chewable tablet Chew 1-2 tablets by mouth 3 (three) times daily as needed for indigestion or  heartburn.     HUMIRA, 2 PEN, 40 MG/0.4ML pen Inject 40 mg into the skin once a week.     mesalamine (CANASA) 1000 MG suppository Place 1,000 mg rectally at bedtime as needed (ulcerative proctitis).     methimazole (TAPAZOLE) 5 MG tablet Take 1 tablet (5 mg total) by mouth daily. 90 tablet 3   Multiple Vitamin (MULTIVITAMIN WITH MINERALS) TABS tablet Take 1 tablet by mouth daily.     norethindrone-ethinyl estradiol 1/35 (NORTREL 1/35, 28,) tablet Take 1 tablet by mouth at bedtime. 28 tablet 11   cyanocobalamin (VITAMIN B12) 1000 MCG/ML injection INJECT 1 ML INTO THE MUSCLE EVERY 30 DAYS 10 mL 0   valACYclovir (VALTREX) 500 MG tablet Take 1 tablet bid x 3 days prn outbreaks. 30 tablet 6    Physical Exam: Blood pressure (!) 140/94, pulse 88, temperature 98.3 F (36.8 C), temperature source Oral, resp. rate 16, height 5\' 4"  (1.626 m), weight 88.5 kg, last menstrual period 09/14/2022, SpO2 98%. Gen: well appearing female, NAD Resp: equal chest rise, no increased WOB CV: RRR Abd: soft, non-distended, well-healed scars, supraumbilical bulge Neuro: moving all extremities  Results for orders placed or performed during the hospital encounter of 11/06/22 (from the past 48 hour(s))  Pregnancy, urine POC     Status: None   Collection Time: 11/06/22  5:43 AM  Result Value Ref Range   Preg Test, Ur NEGATIVE NEGATIVE    Comment:        THE SENSITIVITY OF THIS METHODOLOGY IS >24 mIU/mL    No results found.  Assessment/Plan 47 y/o F w/ an umbilical hernia who presents for elective repair  - Will proceed to OR as planned - Tentative plan for discharge from PACU  [facility] 11/06/2022, 7:08 AM Please see Amion for pager number during day hours 7:00am-4:30pm or 7:00am -11:30am on weekends

## 2022-11-06 NOTE — Op Note (Signed)
Post-Op Note/Post-Procedure Note  Patient: Sue [name] MRN: [id number] DOB: 1976-01-16 Sex: female Operation/Procedure Date: 11/06/2022 Surgeons and Role:    * Bethzy Hauck, Lucilla Edin, MD - Primary    * Stechschulte, Hyman Hopes, MD - Assisting  Pre-operative Diagnoses: umbilical hernia  Postoperative Diagnoses: incisional hernia (2cmx2cm)  Procedures:   * HERNIA REPAIR UMBILICAL ADULT Anesthesia: General endotracheal anesthesia  Indications: [name] is a 47 y.o. year old female who presents for umbilical hernia repair. Preoperatively, I discussed in detail the risks, benefits, alternatives, and potential complications. The patient understands and requests to proceed.  Operative Findings: 2cm x 2cm incisional hernia at the site of prior port placement  Technique: The patient was positively identified and was taken to the operating room and placed supine on the operating table. A time-out was performed confirming correct patient and procedure. We also confirmed initiation of deep venous thrombosis prophylaxis and wound prophylaxis. After successful induction of general endotracheal anesthesia, the arms were left out and padded. The abdomen was prepped and draped in the usual sterile surgical fashion.  We began by making a curvilinear incision inferior to the umbilicus using a 15 blade. The subcutaneous tissue was divided. We soon encountered the hernia sac. The hernia sac was dissected free from the subcutaneous attachments circumferentially down to the rim of the fascial defect.  It appeared that the hernia originated above the umbilicus, likely at the site of a prior incision.  We ensured hemostasis after this dissection. The patient had fairly healthy fascia circumferentially. This hernia defect was approximately 2cm in size. We ensured hemostasis. The fascia was closed using interrupted 0-PDS suture after abrading the edges with the Bovie. We then approximated the redundant skin at the  umbilicus to the anterior fascia using 3-0 Vicryl suture in a cosmetically appealing fashion. We then placed exaprel in the subcutaneous tissues. The skin was closed using interrupted 3-0 Vicryl deep dermal sutures followed by a running 4-0 Monocryl subcuticular suture. Dermabond was applied.  The patient tolerated the procedure well and was extubated and taken to recovery room in good condition.  All sponge, needle, and instrument counts were reported correct at the end of the case.  Estimated Blood Loss: Minimal. Specimens: None Implants: * No implants in log * Drains: None. Complications: * No complications entered in OR log * Condition of the patient: Good, extubated Disposition: PACU

## 2022-11-06 NOTE — Discharge Instructions (Signed)
Home Care After Hernia Repair   Activity  Limit activity for the first 24 hours, then you may return to normal daily activities. Returning to normal daily activities as soon as you can following surgery will enhance recovery time.  No heavy lifting pushing or pulling, anything heavier than 10 pounds (gallon of milk weighs approx. 8.8 pounds) for 4-6 weeks from surgery date.  Do not mow the lawn, use a vacuum cleaner, or do any other strenuous activities without first consulting your surgical team.  Climb stairs slowly and watch your step.  Walk as often as you feel able to increase strength and endurance.  No driving or operating heavy machinery within 24 hours of taking narcotic pain medication.  Abdominal Core Rehab if referral placed for you.  If you think you could benefit from rehab please talk with your surgeon.  For additional information about your recovery and activities you can do to help your recover, please download the [place] [facility]) app to your phone and follow the instructions.  The app can be found by clicking one of the links below, by entering [facility]' in your Appstore, or by opening your camera app and following one of the links in the QR codes below:  Diet  Drink plenty of fluids and eat a light meal on the night of surgery. Some patients may find their appetite is poor for a week or two after surgery. This is a normal result of the stress of surgery-your appetite will return in time.   There are no specific diet restrictions after surgery.  Dressings and Wound Care o Dermabond/Durabond (skin glue): This will usually remain in place for 10-14 days, then naturally fall off your skin. You may take a shower 24 hrs after surgery, carefully wash, not scrub the incision site with a mild non-scented soap. Pat dry with a soft towel.  Do not pick or peel skin glue off.   You can shower and let the water fall on the dressings above. Do not soak or  submerge your incision(s) in a bath tub, hot tub, or swimming pool, until your doctor says it is ok to do so or the incision(s) have completely healed, usually about 2-4 weeks.  Do not use creams, powder, salves or balms on your incision(s).    What to Expect After Surgery   Moderate discomfort controlled with medications  Minimal drainage from incision  You may feel pain in one or both shoulders. This pain comes from the gas still left in your belly after the surgery, if you had laparoscopic surgery (several small incisions). The pain should ease over several days to a week. Ambulation will help with this pain.   Belly swelling  Feeling fatigue and weak  Constipation after surgery is common. Drink plenty fluids and eat a high fiber diet.  Swelling - In some patients might feel that their hernia has returned after surgery-DO NOT Worry this is normal. Swelling may be due to the development of a seroma. Seroma is fluid that has built up where the hernia was repaired this is a normal result after surgery and it will slowly reabsorb back into your body over the next several weeks.   Pain Control: Prescribed Non-Narcotic Pain Medication  You will be given three prescriptions.  Two of them will be for prescription strength ibuprofen (i.e. Advil) and prescription strength acetaminophen (i.e. Tylenol).  The vast majority of patients will just need these two medications.  One prescription will be  for a 'rescue' prescription of an oral narcotic (oxycodone).  You may fill this if needed.  You will alternate taking the ibuprofen (600mg ) every 6 hours and also the acetaminophen (650mg ) every 6 hours so that you are taking one of those medications every 3 hours.  For example: o 0800 - take ibuprofen 600mg  o 1100 - take acetaminophen 650mg  o 1400 - take ibuprofen 600mg  o 1700 - take acetaminophen 650mg  o Etc.  Continue taking this alternating pattern of ibuprofen and acetaminophen for 3 days  If you cannot  take one or the other of these medications, just take the one you can every 6 hours.  If you are comfortable at night, you don't have to wake up and take a medication.  If you are still uncomfortable after taking either ibuprofen or acetaminophen, try gentle stretching exercise and ice packs (a bag of frozen vegetables works great).  If you are still uncomfortable, you may fill the narcotic prescription of Oxycodone and take as directed.  Once you have completed these prescriptions, your pain level should be low enough to stop taking medications altogether or just use an over the counter medication (ibuprofen or acetaminophen) as needed.   Pain Control: Over the Counter Medications to take as needed:  Colace/Docusate: May be prescribed by your surgeon to prevent constipation caused by the combination of narcotics, effects of anesthesia, and decreased ambulation.  Hold for loose stools or diarrhea. Take 100 mg 1-2 times a day starting tonight.   Fiber: High fiber foods, extra liquids (water 9-13 cups/day) can also assist with constipation. Examples of high fiber foods are fruit, bran. Prune juice and water are also good liquids to drink.  Milk of Magnesia/Miralax:  If constipated despite take the over the counter stool softeners, you may take Milk of Magnesia or Miralax as directed on bottle to assist with constipation.     Pepcid/Famotidine: May be prescribed while taking naproxen (Aleve) or other NSAIDs such as ibuprofen (Motrin/Advil) to prevent stomach upset or Acid-reflux symptoms. Take 1 tablet 1-2 times a day.   **Constipation: The first bowel movement may occur anywhere between 1-5 days after surgery.  As long as you are not nauseated or not having significant abdominal pain this variation is acceptable.  Narcotic pain medications can cause constipation increasing discomfort; early discontinuation will assist with bowel management.If constipated despite taking stool softeners, you may take Milk of  Magnesia or Miralax as directed on the bottle.     **Home medications: You may restart your home medications as directed by your respective Primary Care Physician or Surgeon.  When to notify your Doctor or Healthcare Team:   Sign of Wound Infection   Fever over 100 degrees.  Wound becomes extremely swollen, shows red streaks, warm to the touch, and/or drainage from the incision site or foul-smelling drainage.  Wound edges separate or opens up  Bleeding or bruising   If you have bleeding, apply pressure to the site and hold the pressure firmly for 5 minutes. If the bleeding continues, apply pressure again and call 911. If the bleeding stopped, call your doctor to report it.   Call your doctor or nurse if you have increased bleeding from your site and increased bruising or a lump forms or gets larger under your skin at the site.  Unrelieved Pain    Call your doctor or nurse if your pain gets worse or is not eased 1 hour after taking your pain medicine, or if it is severe and uncontrolled.  Nausea and Vomiting   Call your doctor or nurse if you have nausea and vomiting that continues more than 24 hours, will not let you keep medicine down and will not let you keep fluids down  Fever, Flu-like symptoms   Fever over 100 degrees and/or chills  Gastrointestinal Bleeding Symptoms    Black tarry bowel movements.  This can be normal after surgery on the stomach, but should resolve in a day or two.    Call 911 if you suddenly have signs of blood loss such as:  Vomiting blood  Fast heart rate  Feeling faint, sweaty, or blacking out  Passing bright red blood from your rectum  Blood Clot Symptoms   Tender, swollen or reddened areas in your calf muscle or thighs.  Numbness or tingling in your lower leg or calf, or at the top of your leg or groin  Skin on your leg looks pale or blue or feels cold to touch  Chest pain or have trouble breathing, lightheadedness, fast heart rate  Sudden Onset of  Symptoms    Call 911 if you suddenly have:  Leg weakness and spasm  Loss of bladder or bowel function  Seizure  Confusion, severe headache, dizziness or feeling unsteady, problems talking, difficulty swallowing, and/or numbness or muscle weakness as these could be signs of a stroke.   Follow up Appointment Your follow up appointment should be scheduled 2-3 weeks after your surgery date.  If you have not previously scheduled for a follow-up visit you can be scheduled by contacting [phone].

## 2022-11-07 ENCOUNTER — Encounter (HOSPITAL_COMMUNITY): Payer: Self-pay | Admitting: General Surgery

## 2022-11-10 ENCOUNTER — Telehealth: Payer: Self-pay

## 2022-11-10 NOTE — Telephone Encounter (Signed)
Pt states she had EKG done on 8/29 at [facility]. Requesting the doctor to call her back about the results. Please call p tback.

## 2022-11-10 NOTE — Telephone Encounter (Signed)
Called patient regarding her wanting her EKG results. Let her know that her EKG did look normal with no concerning findings on her EKG.

## 2022-12-01 DIAGNOSIS — Z09 Encounter for follow-up examination after completed treatment for conditions other than malignant neoplasm: Secondary | ICD-10-CM | POA: Diagnosis not present

## 2022-12-15 ENCOUNTER — Ambulatory Visit: Payer: Medicaid Other | Admitting: Internal Medicine

## 2022-12-15 ENCOUNTER — Encounter: Payer: Self-pay | Admitting: Internal Medicine

## 2022-12-15 VITALS — BP 126/83 | HR 89 | Temp 98.4°F | Ht 64.0 in | Wt 198.2 lb

## 2022-12-15 DIAGNOSIS — E7849 Other hyperlipidemia: Secondary | ICD-10-CM

## 2022-12-15 DIAGNOSIS — I10 Essential (primary) hypertension: Secondary | ICD-10-CM | POA: Diagnosis not present

## 2022-12-15 DIAGNOSIS — M069 Rheumatoid arthritis, unspecified: Secondary | ICD-10-CM | POA: Diagnosis not present

## 2022-12-15 DIAGNOSIS — E0590 Thyrotoxicosis, unspecified without thyrotoxic crisis or storm: Secondary | ICD-10-CM | POA: Diagnosis not present

## 2022-12-15 DIAGNOSIS — E785 Hyperlipidemia, unspecified: Secondary | ICD-10-CM

## 2022-12-15 DIAGNOSIS — Z0000 Encounter for general adult medical examination without abnormal findings: Secondary | ICD-10-CM

## 2022-12-15 DIAGNOSIS — E538 Deficiency of other specified B group vitamins: Secondary | ICD-10-CM

## 2022-12-15 NOTE — Assessment & Plan Note (Signed)
The patient has a history of Vit B12 deficiency and gives herself monthly B12 injections. She has been out of this since July.  Plan: - Recheck CBC and B12 today - Will send in B12 rx if appropriate

## 2022-12-15 NOTE — Assessment & Plan Note (Signed)
Last lipid profile with elevated trigs to 296 and LDL 101. At her last office visit she was counseled on lifestyle modifications including increased exercise and decreasing intake of fatty foods.   Plan: - Recheck lipid profile today

## 2022-12-15 NOTE — Assessment & Plan Note (Signed)
Blood pressure is well controlled on amlodipine 10 mg daily. No changes to BP regimen. BMP was normal in August.

## 2022-12-15 NOTE — Patient Instructions (Signed)
Thank you, [name] for allowing [place] to provide your care today. Today we discussed:  High blood pressure: blood pressure is well controlled! Keep taking amlodipine once a day High cholesterol: we are rechecking your labs today B12 deficiency: we are rechecking this today as well - will call you with results and if you need to restart the b12 injections We are checking your thyroid labs today too  I have ordered the following labs for you:   Lab Orders         Lipid Profile         Vitamin B12         TSH         CBC with Diff       Referrals ordered today:   Referral Orders  No referral(s) requested today     I have ordered the following medication/changed the following medications:   Stop the following medications: There are no discontinued medications.   Start the following medications: No orders of the defined types were placed in this encounter.    Follow up: 1 year    Should you have any questions or concerns please call the internal medicine clinic at [phone].     Sue [name], D.O. [facility]

## 2022-12-15 NOTE — Assessment & Plan Note (Signed)
Patient has a history of hyperthyroidism on methimazole 5 mg daily. She follows with [facility] endocrinology but has not seen them since last December. She denies hair loss, heat intolerance, tremor, or diarrhea. She does feel fatigued.  Plan: - Check TSH - Follow up with endocrinology again in December

## 2022-12-15 NOTE — Assessment & Plan Note (Signed)
-   received flu shot one week ago at [facility]

## 2022-12-15 NOTE — Progress Notes (Signed)
CC: annual visit  HPI:  [name] is a 47 y.o. female living with a history stated below and presents today for her yearly evaluation. Please see problem based assessment and plan for additional details.  Past Medical History:  Diagnosis Date   Asthma    mild by PFT's 03/16/1998   Cervical lymphadenopathy    biopsy negativeMarvetta [name])   Chronic hypertension    Complete spontaneous abortion    x2   Cough 01/07/2018   GERD (gastroesophageal reflux disease)    Headache    History of abnormal Pap smear    s/p cryo( Dr. 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    Vaginal Pap smear, abnormal    Vitamin B12 deficiency     Current Outpatient Medications on File Prior to Visit  Medication Sig Dispense Refill   albuterol (VENTOLIN HFA) 108 (90 Base) MCG/ACT inhaler Inhale 1-2 puffs into the lungs every 6 (six) hours as needed for wheezing or shortness of breath. 8 g 3   amLODipine (NORVASC) 10 MG tablet Take 1 tablet (10 mg total) by mouth at bedtime. 90 tablet 3   Brimonidine Tartrate (LUMIFY) 0.025 % SOLN Place 1 drop into both eyes as needed (irritation).     calcium carbonate (TUMS - DOSED IN MG ELEMENTAL CALCIUM) 500 MG chewable tablet Chew 1-2 tablets by mouth 3 (three) times daily as needed for indigestion or heartburn.     cyanocobalamin (VITAMIN B12) 1000 MCG/ML injection INJECT 1 ML INTO THE MUSCLE EVERY 30 DAYS 10 mL 0   HUMIRA, 2 PEN, 40 MG/0.4ML pen Inject 40 mg into the skin once a week.      mesalamine (CANASA) 1000 MG suppository Place 1,000 mg rectally at bedtime as needed (ulcerative proctitis).     methimazole (TAPAZOLE) 5 MG tablet Take 1 tablet (5 mg total) by mouth daily. 90 tablet 3   Multiple Vitamin (MULTIVITAMIN WITH MINERALS) TABS tablet Take 1 tablet by mouth daily.     norethindrone-ethinyl estradiol 1/35 (NORTREL 1/35, 28,) tablet Take 1 tablet by mouth at bedtime. 28 tablet 11   valACYclovir (VALTREX) 500 MG tablet Take 1 tablet bid x 3 days prn outbreaks. 30 tablet 6   No current facility-administered medications on file prior to visit.    Family History  Problem Relation Age of Onset   Ulcerative colitis Mother    Hypertension Mother    Diabetes Father    Heart attack Father    Hypertension Father    Diabetes Sister    Thyroid disease Sister    Heart attack Maternal Grandmother    Stroke Maternal Grandmother    Breast cancer Neg Hx     Social History   Socioeconomic History   Marital status: Single    Spouse name: Not on file   Number of children: Not on file   Years of education: Not on file   Highest education level: Not on file  Occupational History   Not on file  Tobacco Use   Smoking status: Never   Smokeless tobacco: Never  Vaping Use   Vaping status: Never Used  Substance and Sexual Activity   Alcohol use: Yes    Alcohol/week: 0.0 standard drinks of alcohol    Comment: occasional beer/wine   Drug use: No   Sexual activity: Yes    Partners: Male    Birth control/protection: Pill  Other Topics Concern   Not on file  Social History Narrative   Not on file   Social Determinants of Health   Financial Resource Strain: Not on file  Food Insecurity: Not on file  Transportation Needs: Not on file  Physical Activity: Not on file  Stress: Not on file  Social Connections: Not on file  Intimate Partner Violence: Not on file    Review of Systems: ROS negative except for what is noted on the assessment and plan.  Vitals:    12/15/22 1502  BP: 126/83  Pulse: 89  Temp: 98.4 F (36.9 C)  TempSrc: Oral  SpO2: 100%  Weight: 198 lb 3.2 oz (89.9 kg)  Height: 5\' 4"  (1.626 m)    Physical Exam: Constitutional: well-appearing female sitting in chair, in no acute distress Cardiovascular: regular rate and rhythm, no m/r/g Pulmonary/Chest: normal work of breathing on room air, lungs clear to auscultation bilaterally Neurological: alert & oriented x 3, no focal deficit Skin: warm and dry Psych: normal mood and behavior  Assessment & Plan:    Patient discussed with Dr. Heide Spark  Rheumatoid arthritis [facility]) Patient has had RA and follows with Rheumatology in [facility]. She has been on Humira and is stable. No recent flares.  Essential hypertension Blood pressure is well controlled on amlodipine 10 mg daily. No changes to BP regimen. BMP was normal in August.   Vitamin B12 deficiency The patient has a history of Vit B12 deficiency and gives herself monthly B12 injections. She has been out of this since July.  Plan: - Recheck CBC and B12 today - Will send in B12 rx if appropriate  Hyperlipidemia Last lipid profile with elevated trigs to 296 and LDL 101. At her last office visit she was counseled on lifestyle modifications including increased exercise and decreasing intake of fatty foods.   Plan: - Recheck lipid profile today  Hyperthyroidism Patient has a history of hyperthyroidism on methimazole 5 mg daily. She follows with [facility] endocrinology but has not seen them since last December. She denies hair loss, heat intolerance, tremor, or diarrhea. She does feel fatigued.  Plan: - Check TSH - Follow up with endocrinology again in December  Healthcare maintenance - received flu shot one week ago at [employer], D.O. [facility] Internal Medicine, PGY-3 Phone: [phone] Date 12/15/2022 Time 3:33 PM

## 2022-12-15 NOTE — Assessment & Plan Note (Signed)
Patient has had RA and follows with Rheumatology in [facility]. She has been on Humira and is stable. No recent flares.

## 2022-12-16 LAB — CBC WITH DIFFERENTIAL/PLATELET
Basophils Absolute: 0.1 10*3/uL (ref 0.0–0.2)
Basos: 1 %
EOS (ABSOLUTE): 0.2 10*3/uL (ref 0.0–0.4)
Eos: 3 %
Hematocrit: 40.9 % (ref 34.0–46.6)
Hemoglobin: 13.5 g/dL (ref 11.1–15.9)
Immature Grans (Abs): 0 10*3/uL (ref 0.0–0.1)
Immature Granulocytes: 0 %
Lymphocytes Absolute: 4.8 10*3/uL — ABNORMAL HIGH (ref 0.7–3.1)
Lymphs: 54 %
MCH: 29.5 pg (ref 26.6–33.0)
MCHC: 33 g/dL (ref 31.5–35.7)
MCV: 90 fL (ref 79–97)
Monocytes Absolute: 0.6 10*3/uL (ref 0.1–0.9)
Monocytes: 6 %
Neutrophils Absolute: 3.1 10*3/uL (ref 1.4–7.0)
Neutrophils: 36 %
Platelets: 365 10*3/uL (ref 150–450)
RBC: 4.57 x10E6/uL (ref 3.77–5.28)
RDW: 13.4 % (ref 11.7–15.4)
WBC: 8.8 10*3/uL (ref 3.4–10.8)

## 2022-12-16 LAB — LIPID PANEL
Chol/HDL Ratio: 5.4 {ratio} — ABNORMAL HIGH (ref 0.0–4.4)
Cholesterol, Total: 217 mg/dL — ABNORMAL HIGH (ref 100–199)
HDL: 40 mg/dL (ref >39)
LDL Chol Calc (NIH): 126 mg/dL — ABNORMAL HIGH (ref 0–99)
Triglycerides: 288 mg/dL — ABNORMAL HIGH (ref 0–149)
VLDL Cholesterol Cal: 51 mg/dL — ABNORMAL HIGH (ref 5–40)

## 2022-12-16 LAB — TSH: TSH: 0.992 u[IU]/mL (ref 0.450–4.500)

## 2022-12-16 LAB — VITAMIN B12: Vitamin B-12: 420 pg/mL (ref 232–1245)

## 2022-12-16 MED ORDER — CYANOCOBALAMIN 1000 MCG/ML IJ SOLN
1000.0000 ug | INTRAMUSCULAR | 0 refills | Status: DC
Start: 2022-12-16 — End: 2023-12-20

## 2022-12-16 NOTE — Addendum Note (Signed)
Addended by: Elza Rafter on: 12/16/2022 02:15 PM   Modules accepted: Orders

## 2022-12-16 NOTE — Progress Notes (Signed)
Attempted to call patient. ASCVD risk is low at 4.4%. CBC unremarkable and B12 is low-normal - would still recommend supplementation. TSH wnl.

## 2022-12-20 ENCOUNTER — Encounter (HOSPITAL_COMMUNITY): Payer: Self-pay

## 2022-12-20 ENCOUNTER — Ambulatory Visit (HOSPITAL_COMMUNITY)
Admission: EM | Admit: 2022-12-20 | Discharge: 2022-12-20 | Disposition: A | Discharge status: Home / Self Care | Payer: Medicaid Other | Attending: Emergency Medicine | Admitting: Emergency Medicine

## 2022-12-20 DIAGNOSIS — M62838 Other muscle spasm: Secondary | ICD-10-CM | POA: Diagnosis not present

## 2022-12-20 MED ORDER — METHOCARBAMOL 500 MG PO TABS: 500.0000 mg | ORAL_TABLET | Freq: Two times a day (BID) | ORAL | 0 refills | Status: DC | PRN

## 2022-12-20 NOTE — Discharge Instructions (Signed)
You can take her back send twice daily as needed for muscle pain and spasms.  This can make you drowsy so do not drive, work or drink alcohol while taking this.  Otherwise alternate between Tylenol ibuprofen for pain every 4-6 hours.  You can alternate between heat and ice.  Return here or follow-up with [facility] if symptoms persist.

## 2022-12-20 NOTE — ED Provider Notes (Signed)
[facility]    CSN: [id number] Arrival date & time: 12/20/22  1450      History   Chief Complaint Chief Complaint  Patient presents with   Leg Pain    HPI Sue [name] is a 47 y.o. female.   Patient presents with intermittent left thigh pain that began this morning.  Patient reports noticing a knot on the back of her thigh.  Patient reports applying ice, and taking Tylenol and ibuprofen with no relief.  Denies any falls or known injuries.   Leg Pain   Past Medical History:  Diagnosis Date   Asthma    mild by PFT's 03/16/1998   Cervical lymphadenopathy    biopsy negativeMarvetta Gibbons)   Chronic hypertension    Complete spontaneous abortion    x2   Cough 01/07/2018   GERD (gastroesophageal reflux disease)    Headache    History of abnormal Pap smear    s/p cryo( Dr. 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    Vaginal Pap smear, abnormal    Vitamin B12 deficiency     Patient Active Problem List   Diagnosis Date Noted   Hot flashes 01/16/2022   Abnormal uterine bleeding (AUB) 01/16/2022   Hyperlipidemia 12/22/2021   Right arm pain 12/19/2021   Recurrent tonsillitis 01/12/2020   Otitis externa 01/10/2020   Vitamin B12 deficiency    Rash of both hands 02/14/2019   Costochondritis 02/14/2019   Hyperthyroidism 05/14/2017   Rheumatoid arthritis (HCC) 02/01/2017   GERD (gastroesophageal reflux disease) 12/03/2016   Alopecia 03/27/2016   Fatty liver 01/05/2016   Hiatal hernia  01/05/2016   Overweight (BMI 25.0-29.9) 01/05/2016   Healthcare maintenance 09/12/2014   Seasonal allergies 11/02/2013   Sacroiliac dysfunction 11/24/2011   Carpal tunnel syndrome 04/10/2011   HERNIATED DISC 12/31/2009   Essential hypertension 06/15/2007    Past Surgical History:  Procedure Laterality Date   biopsy of lymph node in R neck  2007   CYST EXCISION     2024 Feb.   LAPAROSCOPIC APPENDECTOMY N/A 05/08/2019   Procedure: APPENDECTOMY LAPAROSCOPIC WITH DIAGNOSTIC LAPAROSCOPY;  Surgeon: [name], MD;  Location: [facility];  Service: General;  Laterality: N/A;   LEEP     TONSILLECTOMY Bilateral 04/26/2020   Procedure: TONSILLECTOMY;  Surgeon: Laren Boom, DO;  Location: [facility];  Service: ENT;  Laterality: Bilateral;   UMBILICAL HERNIA REPAIR N/A 11/06/2022   Procedure: HERNIA REPAIR UMBILICAL ADULT;  Surgeon: Moise Boring, MD;  Location: [facility];  Service: General;  Laterality: N/A;   WISDOM TOOTH EXTRACTION      OB History     Gravida  7   Para  4   Term  3   Preterm  1   AB  3   Living  4      SAB  3   IAB      Ectopic      Multiple  0   Live Births  4            Home Medications    Prior to Admission medications   Medication Sig Start Date End Date Taking? Authorizing Provider  albuterol (VENTOLIN HFA) 108 (90 Base) MCG/ACT inhaler Inhale 1-2 puffs into the lungs every 6 (six) hours as needed for wheezing or shortness of breath. 12/10/20  Yes Aslam, Leanna Sato, MD  amLODipine (NORVASC) 10 MG tablet Take 1 tablet (10 mg total) by mouth at bedtime. 03/04/22  Yes Reymundo Poll, MD  Brimonidine Tartrate (LUMIFY) 0.025 % SOLN Place 1 drop into both eyes as needed (irritation).   Yes [provider]  calcium carbonate (TUMS - DOSED IN MG ELEMENTAL CALCIUM) 500 MG chewable tablet Chew 1-2 tablets by mouth 3 (three) times daily as needed for indigestion or heartburn.   Yes [provider]  cyanocobalamin (VITAMIN B12)  1000 MCG/ML injection Inject 1 mL (1,000 mcg total) into the skin every 30 (thirty) days. 12/16/22  Yes Atway, Rayann N, DO  HUMIRA, 2 PEN, 40 MG/0.4ML pen Inject 40 mg into the skin once a week. 04/06/22  Yes [provider]  mesalamine (CANASA) 1000 MG suppository Place 1,000 mg rectally at bedtime as needed (ulcerative proctitis).   Yes [provider]  methimazole (TAPAZOLE) 5 MG tablet Take 1 tablet (5 mg total) by mouth daily. 11/27/21  Yes Carlus Pavlov, MD  methocarbamol (ROBAXIN) 500 MG tablet Take 1 tablet (500 mg total) by mouth 2 (two) times daily as needed for muscle spasms. 12/20/22  Yes Wynonia Lawman A, NP  Multiple Vitamin (MULTIVITAMIN WITH MINERALS) TABS tablet Take 1 tablet by mouth daily.   Yes [provider]  norethindrone-ethinyl estradiol 1/35 (NORTREL 1/35, 28,) tablet Take 1 tablet by mouth at bedtime. 02/27/22  Yes Brock Bad, MD  valACYclovir (VALTREX) 500 MG tablet Take 1 tablet bid x 3 days prn outbreaks. 09/29/22  Yes Brock Bad, MD    Family History Family History  Problem Relation Age of Onset   Ulcerative colitis Mother    Hypertension Mother    Diabetes Father    Heart attack Father    Hypertension Father    Diabetes Sister    Thyroid disease Sister    Heart attack Maternal Grandmother    Stroke Maternal Grandmother    Breast cancer Neg Hx     Social History Social History   Tobacco Use   Smoking status: Never   Smokeless tobacco: Never  Vaping Use   Vaping status: Never Used  Substance Use Topics   Alcohol use: Yes    Alcohol/week: 0.0 standard drinks of alcohol    Comment: occasional beer/wine   Drug use: No     Allergies   Ciprofloxacin, Clindamycin/lincomycin, Bactrim [sulfamethoxazole-trimethoprim], and Amoxicillin   Review of Systems Review of Systems  Musculoskeletal:  Positive for myalgias. Negative for gait problem and joint swelling.     Physical Exam Triage Vital Signs ED  Triage Vitals  Encounter Vitals Group     BP 12/20/22 1552 126/84     Systolic BP Percentile --      Diastolic BP Percentile --      Pulse Rate 12/20/22 1552 74     Resp 12/20/22 1552 18     Temp 12/20/22 1552 99.1 F (37.3 C)     Temp Source 12/20/22 1552 Oral     SpO2 12/20/22 1552 96 %     Weight 12/20/22 1552 198 lb (89.8  kg)     Height 12/20/22 1552 5\' 4"  (1.626 m)     Head Circumference --      Peak Flow --      Pain Score 12/20/22 1550 7     Pain Loc --      Pain Education --      Exclude from Growth Chart --    No data found.  Updated Vital Signs BP 126/84 (BP Location: Left Arm)   Pulse 74   Temp 99.1 F (37.3 C) (Oral)   Resp 18   Ht 5\' 4"  (1.626 m)   Wt 198 lb (89.8 kg)   LMP 12/10/2022 (Approximate)   SpO2 96%   BMI 33.99 kg/m   Visual Acuity Right Eye Distance:   Left Eye Distance:   Bilateral Distance:    Right Eye Near:   Left Eye Near:    Bilateral Near:     Physical Exam Vitals and nursing note reviewed.  Constitutional:      General: She is awake. She is not in acute distress.    Appearance: Normal appearance. She is well-developed and well-groomed. She is not ill-appearing, toxic-appearing or diaphoretic.  Musculoskeletal:     Right upper leg: Normal.     Left upper leg: Swelling and tenderness present. No edema, deformity or bony tenderness.     Comments: Mild tenderness and swelling to posterior left thigh.  Neurological:     Mental Status: She is alert.  Psychiatric:        Behavior: Behavior is cooperative.      UC Treatments / Results  Labs (all labs ordered are listed, but only abnormal results are displayed) Labs Reviewed - No data to display  EKG   Radiology No results found.  Procedures Procedures (including critical care time)  Medications Ordered in UC Medications - No data to display  Initial Impression / Assessment and Plan / UC Course  I have reviewed the triage vital signs and the nursing  notes.  Pertinent labs & imaging results that were available during my care of the patient were reviewed by me and considered in my medical decision making (see chart for details).     Patient presented with intermittent left thigh pain that began this morning. Patient reports noticing a knot on the back of her thigh.  Denies any falls or known injuries.  Upon assessment patient has mild tenderness and swelling to posterior left thigh.  Muscle feels very tight upon palpation.  Patient prescribed Robaxin as needed for muscle pain and spasms.  Discussed follow-up and return precautions. Final Clinical Impressions(s) / UC Diagnoses   Final diagnoses:  Muscle spasm of left lower extremity     Discharge Instructions      You can take her back send twice daily as needed for muscle pain and spasms.  This can make you drowsy so do not drive, work or drink alcohol while taking this.  Otherwise alternate between Tylenol ibuprofen for pain every 4-6 hours.  You can alternate between heat and ice.  Return here or follow-up with [facility] if symptoms persist.    ED Prescriptions     Medication Sig Dispense Auth. Provider   methocarbamol (ROBAXIN) 500 MG tablet Take 1 tablet (500 mg total) by mouth 2 (two) times daily as needed for muscle spasms. 20 tablet Wynonia Lawman A, NP      PDMP not reviewed this encounter.   Wynonia Lawman A, NP 12/20/22 1715

## 2022-12-20 NOTE — ED Triage Notes (Signed)
Left Thigh Pain, woke up with a knot on the back of the thigh with throbbing pain. Tried ice, tylenol, and ibuprofen with no relief. No known falls, injures, or new activities.   Onset today.

## 2022-12-21 ENCOUNTER — Telehealth: Payer: Self-pay | Admitting: Internal Medicine

## 2022-12-21 DIAGNOSIS — E0590 Thyrotoxicosis, unspecified without thyrotoxic crisis or storm: Secondary | ICD-10-CM

## 2022-12-21 MED ORDER — METHIMAZOLE 5 MG PO TABS
5.0000 mg | ORAL_TABLET | Freq: Every day | ORAL | 0 refills | Status: DC
Start: 2022-12-21 — End: 2023-04-16

## 2022-12-21 NOTE — Telephone Encounter (Signed)
Requested Prescriptions   Signed Prescriptions Disp Refills   methimazole (TAPAZOLE) 5 MG tablet 90 tablet 0    Sig: Take 1 tablet (5 mg total) by mouth daily.    Authorizing Provider: Carlus Pavlov    Ordering User: Pollie Meyer

## 2022-12-21 NOTE — Telephone Encounter (Signed)
MEDICATION: methimazole methimazole (TAPAZOLE) 5 MG tablet  PHARMACY:    [id number] - [place], [place] - [address] AT [address] (Ph: [phone])    HAS THE PATIENT CONTACTED THEIR PHARMACY?  yes  IS THIS A 90 DAY SUPPLY : yes  IS PATIENT OUT OF MEDICATION: yes  IF NOT; HOW MUCH IS LEFT:   LAST APPOINTMENT DATE: @ 02/27/2022  NEXT APPOINTMENT DATE:@12 /20/2024  DO WE HAVE YOUR PERMISSION TO LEAVE A DETAILED MESSAGE?: yes  OTHER COMMENTS: Patient out of medication for 2 weeks   **Let patient know to contact pharmacy at the end of the day to make sure medication is ready. **  ** Please notify patient to allow 48-72 hours to process**  **Encourage patient to contact the pharmacy for refills or they can request refills through [facility]**

## 2022-12-22 NOTE — Progress Notes (Signed)
Internal Medicine Clinic Attending  Case discussed with the resident at the time of the visit.  We reviewed the resident's history and exam and pertinent patient test results.  I agree with the assessment, diagnosis, and plan of care documented in the resident's note.  

## 2022-12-28 ENCOUNTER — Other Ambulatory Visit: Payer: Self-pay | Admitting: Obstetrics and Gynecology

## 2022-12-28 DIAGNOSIS — Z1231 Encounter for screening mammogram for malignant neoplasm of breast: Secondary | ICD-10-CM

## 2023-01-22 ENCOUNTER — Ambulatory Visit: Payer: Medicaid Other

## 2023-01-29 ENCOUNTER — Ambulatory Visit
Admission: RE | Admit: 2023-01-29 | Discharge: 2023-01-29 | Disposition: A | Discharge status: Home / Self Care | Payer: Medicaid Other | Source: Ambulatory Visit | Attending: Obstetrics and Gynecology | Admitting: Obstetrics and Gynecology

## 2023-01-29 DIAGNOSIS — Z1231 Encounter for screening mammogram for malignant neoplasm of breast: Secondary | ICD-10-CM

## 2023-02-03 ENCOUNTER — Encounter: Payer: Self-pay | Admitting: Student

## 2023-02-08 ENCOUNTER — Other Ambulatory Visit: Payer: Self-pay | Admitting: *Deleted

## 2023-02-08 DIAGNOSIS — N939 Abnormal uterine and vaginal bleeding, unspecified: Secondary | ICD-10-CM

## 2023-02-08 MED ORDER — NORTREL 1/35 (28) 1-35 MG-MCG PO TABS
1.0000 | ORAL_TABLET | Freq: Every day | ORAL | 2 refills | Status: DC
Start: 2023-02-08 — End: 2023-03-05

## 2023-02-08 NOTE — Progress Notes (Signed)
Birth control refill sent.  Pt will need AEX to continue Rx.

## 2023-02-26 ENCOUNTER — Encounter: Payer: Self-pay | Admitting: Internal Medicine

## 2023-02-26 ENCOUNTER — Ambulatory Visit: Payer: Medicaid Other | Admitting: Internal Medicine

## 2023-02-26 VITALS — BP 120/70 | HR 88 | Ht 64.0 in | Wt 196.6 lb

## 2023-02-26 DIAGNOSIS — E0590 Thyrotoxicosis, unspecified without thyrotoxic crisis or storm: Secondary | ICD-10-CM

## 2023-02-26 NOTE — Progress Notes (Signed)
Patient ID: Sue [name], female   DOB: 21-Mar-1975, 47 y.o.   MRN: [id number]  HPI  Sue [name] is a 47 y.o.-year-old female, returning for follow-up for thyrotoxicosis.  She previously saw Dr. Everardo All, last visit 10 mo ago. Her mother, Charma Igo, is also my pt.  Interim history: At today's visit, she feels well, without complaints other than hot flashes, which are not new. She has night sweats.  She mentions increased fatigue.  She is on OCPs.  Reviewed and addended history: Patient has been having mild subclinical thyrotoxicosis intermittently since 2009.   She was started on methimazole in 2020, during a pregnancy.  She has no complaints at this visit other than red eyes and occasional blurry vision when driving.  She sees Dr. Dione Booze.  She continues on methimazole 5 mg daily.  I reviewed pt's thyroid tests: Lab Results  Component Value Date   TSH 0.992 12/15/2022   TSH 0.79 02/27/2022   TSH 0.48 04/22/2021   TSH 0.60 07/26/2020   TSH 0.71 10/27/2019   TSH 1.29 06/27/2019   TSH 0.655 09/12/2018   TSH 0.25 (L) 04/07/2018   TSH 0.60 05/14/2017   TSH 0.75 03/11/2017   FREET4 1.1 02/27/2022   FREET4 0.95 04/22/2021   FREET4 0.78 07/26/2020   FREET4 1.1 10/27/2019   FREET4 1.02 06/27/2019   FREET4 1.26 09/12/2018   FREET4 1.06 04/07/2018   FREET4 0.78 05/14/2017   FREET4 0.58 (L) 03/11/2017   FREET4 0.63 01/11/2017   T3FREE 3.2 02/27/2022   Antithyroid antibodies: Lab Results  Component Value Date   TSI <89 02/27/2022   Pt denies: - feeling nodules in neck - hoarseness - dysphagia - choking  She mentions: - Hot flashes - fatigue  She denies: - tremors - anxiety - palpitations - hyperdefecation - weight loss  Pt has a FH of thyroid ds. In her sister and mother. No FH of thyroid cancer. No h/o radiation tx to head or neck. No steroid use. No herbal supplements. No Biotin use.  Pt. also has a history of ulcerative proctitis, rheumatoid arthritis - on Humira,  arthritis associated with IBD, vitamin D deficiency, hyperlipidemia, GERD, carpal tunnel.  ROS: + see HPI  Past Medical History:  Diagnosis Date   Asthma    mild by PFT's 03/16/1998   Cervical lymphadenopathy    biopsy negativeMarvetta Gibbons)   Chronic hypertension    Complete spontaneous abortion    x2   Cough 01/07/2018   GERD (gastroesophageal reflux disease)    Headache    History of abnormal Pap smear    s/p cryo( Dr. 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    Vaginal Pap smear, abnormal    Vitamin B12 deficiency    Past Surgical History:  Procedure Laterality Date   biopsy of lymph node in R neck  2007   CYST EXCISION     2024 Feb.   LAPAROSCOPIC APPENDECTOMY N/A 05/08/2019  Procedure: APPENDECTOMY LAPAROSCOPIC WITH DIAGNOSTIC LAPAROSCOPY;  Surgeon: [name], MD;  Location: [facility];  Service: General;  Laterality: N/A;   LEEP     TONSILLECTOMY Bilateral 04/26/2020   Procedure: TONSILLECTOMY;  Surgeon: Laren Boom, DO;  Location: [facility];  Service: ENT;  Laterality: Bilateral;   UMBILICAL HERNIA REPAIR N/A 11/06/2022   Procedure: HERNIA REPAIR UMBILICAL ADULT;  Surgeon: Moise Boring, MD;  Location: [facility];  Service: General;  Laterality: N/A;   WISDOM TOOTH EXTRACTION     Social History   Socioeconomic History   Marital status: Single    Spouse name: Not on file   Number of children: Not on file   Years of education: Not on file   Highest education level: Not on  file  Occupational History   Not on file  Tobacco Use   Smoking status: Never   Smokeless tobacco: Never  Vaping Use   Vaping status: Never Used  Substance and Sexual Activity   Alcohol use: Yes    Alcohol/week: 0.0 standard drinks of alcohol    Comment: occasional beer/wine   Drug use: No   Sexual activity: Yes    Partners: Male    Birth control/protection: Pill  Other Topics Concern   Not on file  Social History Narrative   Not on file   Social Drivers of Health   Financial Resource Strain: Not on file  Food Insecurity: Not on file  Transportation Needs: Not on file  Physical Activity: Not on file  Stress: Not on file  Social Connections: Not on file  Intimate Partner Violence: Not on file   Current Outpatient Medications on File Prior to Visit  Medication Sig Dispense Refill   albuterol (VENTOLIN HFA) 108 (90 Base) MCG/ACT inhaler Inhale 1-2 puffs into the lungs every 6 (six) hours as needed for wheezing or shortness of breath. 8 g 3   amLODipine (NORVASC) 10 MG tablet Take 1 tablet (10 mg total) by mouth at bedtime. 90 tablet 3   Brimonidine Tartrate (LUMIFY) 0.025 % SOLN Place 1 drop into both eyes as needed (irritation).     calcium carbonate (TUMS - DOSED IN MG ELEMENTAL CALCIUM) 500 MG chewable tablet Chew 1-2 tablets by mouth 3 (three) times daily as needed for indigestion or heartburn.     cyanocobalamin (VITAMIN B12) 1000 MCG/ML injection Inject 1 mL (1,000 mcg total) into the skin every 30 (thirty) days. 10 mL 0   HUMIRA, 2 PEN, 40 MG/0.4ML pen Inject 40 mg into the skin once a week.     mesalamine (CANASA) 1000 MG suppository Place 1,000 mg rectally at bedtime as needed (ulcerative proctitis).     methimazole (TAPAZOLE) 5 MG tablet Take 1 tablet (5 mg total) by mouth daily. 90 tablet 0   methocarbamol (ROBAXIN) 500 MG tablet Take 1 tablet (500 mg total) by mouth 2 (two) times daily as needed for muscle spasms. 20 tablet 0   Multiple Vitamin (MULTIVITAMIN WITH  MINERALS) TABS tablet Take 1 tablet by mouth daily.     norethindrone-ethinyl estradiol 1/35 (NORTREL 1/35, 28,) tablet Take 1 tablet by mouth at bedtime. 28 tablet 2   valACYclovir (VALTREX) 500 MG tablet Take 1 tablet bid x 3 days prn outbreaks. 30 tablet 6   No current facility-administered medications on file prior to visit.   Allergies  Allergen Reactions   Ciprofloxacin Anaphylaxis and Swelling   Clindamycin/Lincomycin Anaphylaxis and Swelling   Bactrim [Sulfamethoxazole-Trimethoprim] Swelling    Swelling is of  the lips   Amoxicillin Rash    Has patient had a PCN reaction causing immediate rash, facial/tongue/throat swelling, SOB or lightheadedness with hypotension: no Has patient had a PCN reaction causing severe rash involving mucus membranes or skin necrosis: pt had rash -- non severe  Has patient had a PCN reaction that required hospitalization: no Has patient had a PCN reaction occurring within the last 10 years: no If all of the above answers are "NO", then may proceed with Cephalosporin use.    Family History  Problem Relation Age of Onset   Ulcerative colitis Mother    Hypertension Mother    Diabetes Father    Heart attack Father    Hypertension Father    Diabetes Sister    Thyroid disease Sister    Heart attack Maternal Grandmother    Stroke Maternal Grandmother    Breast cancer Neg Hx    PE: BP 120/70   Pulse 88   Ht 5\' 4"  (1.626 m)   Wt 196 lb 9.6 oz (89.2 kg)   SpO2 97%   BMI 33.75 kg/m  Wt Readings from Last 3 Encounters:  02/26/23 196 lb 9.6 oz (89.2 kg)  12/20/22 198 lb (89.8 kg)  12/15/22 198 lb 3.2 oz (89.9 kg)   Constitutional: overweight, in NAD Eyes:  EOMI, no exophthalmos, no lid lag, no stare ENT: no neck masses, no thyromegaly, no cervical lymphadenopathy Cardiovascular: RRR, No MRG Respiratory: CTA B Musculoskeletal: no deformities Skin:no rashes Neurological: no tremor with outstretched hands  ASSESSMENT: 1. Thyrotoxicosis  2.   Thyroid nodule  PLAN:  1. Patient with history of mild thyrotoxicosis dating back to 2009 per review of the chart, with intermittently normal TFTs since then.  She mentions that she was advised about the diagnosis of hyperthyroidism approximately 4-5 years ago during pregnancy but she was asymptomatic.  She was started on methimazole at that time.  At last visit she was on 5 mg daily.  We continued this dose as her TFTs returned to normal.  Latest TSH is from 12/15/2022 and this was normal. -At last visit, we discussed about possible etiologies of her thyrotoxicosis to include Graves' disease (however, TSI's were normal), thyroiditis, toxic multinodular goiter/toxic adenoma.  She did not appear to have exogenous causes for her low TSH. -Results at last visit, including TSI antibodies were normal.  Our working diagnosis is mild Graves' disease. -She does not have thyrotoxic symptoms: Unintentional weight loss, tremors, palpitations, anxiety, hyperdefecation.  She does have hot flashes and night sweats, possibly related to perimenopause, although she is on an OCP.  She does feel tired. She does not have active signs of Graves' ophthalmopathy including double vision, eye pain, blurry vision. -At today's visit, we will recheck her TFTs and adjust the methimazole dose accordingly. - RTC in 1 year but possibly sooner for labs  2. Thyroid nodule -She has a history of a subcentimeter left thyroid nodule incidentally seen on a neck CT in 2014 -She had a repeat CT scan of the neck in 01/2020 and this showed stability of the nodule, which did not meet criteria for further ultrasound investigation -She denies neck compression symptoms -No nodules were felt on palpation of her neck today -Will continue to follow her expectantly for this  Orders Placed This Encounter  Procedures   TSH   T4, free   T3, free   Thyroid stimulating immunoglobulin    Carlus Pavlov, MD PhD [facility] Endocrinology

## 2023-02-26 NOTE — Patient Instructions (Signed)
Please continue Methimazole 5 mg daily.  Please stop at the lab.  You should have an endocrinology follow-up appointment in 1 year.  

## 2023-03-02 LAB — THYROID STIMULATING IMMUNOGLOBULIN: TSI: <89 %{baseline} (ref <140)

## 2023-03-02 LAB — T3, FREE: T3, Free: 3.1 pg/mL (ref 2.3–4.2)

## 2023-03-02 LAB — T4, FREE: Free T4: 1 ng/dL (ref 0.8–1.8)

## 2023-03-02 LAB — TSH: TSH: 0.84 m[IU]/L

## 2023-03-05 ENCOUNTER — Encounter: Payer: Self-pay | Admitting: Obstetrics and Gynecology

## 2023-03-05 ENCOUNTER — Other Ambulatory Visit: Payer: Self-pay | Admitting: Internal Medicine

## 2023-03-05 ENCOUNTER — Other Ambulatory Visit (HOSPITAL_COMMUNITY)
Admission: RE | Admit: 2023-03-05 | Discharge: 2023-03-05 | Disposition: A | Discharge status: Home / Self Care | Payer: Medicaid Other | Source: Ambulatory Visit | Attending: Obstetrics and Gynecology | Admitting: Obstetrics and Gynecology

## 2023-03-05 ENCOUNTER — Ambulatory Visit (INDEPENDENT_AMBULATORY_CARE_PROVIDER_SITE_OTHER): Payer: Medicaid Other | Admitting: Obstetrics and Gynecology

## 2023-03-05 VITALS — BP 113/73 | HR 74 | Ht 64.0 in | Wt 195.0 lb

## 2023-03-05 DIAGNOSIS — Z01419 Encounter for gynecological examination (general) (routine) without abnormal findings: Secondary | ICD-10-CM | POA: Insufficient documentation

## 2023-03-05 DIAGNOSIS — I10 Essential (primary) hypertension: Secondary | ICD-10-CM

## 2023-03-05 DIAGNOSIS — A6009 Herpesviral infection of other urogenital tract: Secondary | ICD-10-CM | POA: Diagnosis not present

## 2023-03-05 MED ORDER — SLYND 4 MG PO TABS: 1.0000 | ORAL_TABLET | Freq: Every day | ORAL | 3 refills | Status: DC

## 2023-03-05 MED ORDER — VALACYCLOVIR HCL 500 MG PO TABS
ORAL_TABLET | ORAL | 6 refills | Status: AC
Start: 2023-03-05 — End: ?

## 2023-03-05 NOTE — Progress Notes (Signed)
ANNUAL EXAM Patient name: Trenton Atalla MRN [id number]  Date of birth: March 08, 1976 Chief Complaint:   Gynecologic Exam  History of Present Illness:   Sue [name] is a 47 y.o. [id number] with Patient's last menstrual period was 03/02/2023 (exact date). being seen today for a routine annual exam.  Current complaints:  Low libido Painful periods on days 2-3  Upstream - 03/05/23 1478       Pregnancy Intention Screening   Does the patient want to become pregnant in the next year? No    Does the patient's partner want to become pregnant in the next year? No    Would the patient like to discuss contraceptive options today? Yes      Contraception Wrap Up   Current Method Vasectomy    End Method Vasectomy    Contraception Counseling Provided No    How was the end contraceptive method provided? N/A            The pregnancy intention screening data noted above was reviewed. Potential methods of contraception were discussed. The patient elected to proceed with Vasectomy.   Last pap 01/16/22. Results were: NILM w/ HRHPV negative. H/O abnormal pap: yes hx HPV+ pap 2015 Last mammogram: 01/29/23. Results were: normal. Family h/o breast cancer: no Last colonoscopy: 03/25/2018. Results were: normal. Family h/o colorectal cancer: no     03/05/2023    8:17 AM 12/15/2022    3:07 PM 01/16/2022    9:13 AM 12/19/2021    8:42 AM 01/12/2020   10:45 AM  Depression screen PHQ 2/9  Decreased Interest 0 0 0 0 0  Down, Depressed, Hopeless 0 0 0 0 0  PHQ - 2 Score 0 0 0 0 0  Altered sleeping 0  0 1 0  Tired, decreased energy 2  0 1 0  Change in appetite 0  0 0 0  Feeling bad or failure about yourself  0  0 0 0  Trouble concentrating 0  0 0 0  Moving slowly or fidgety/restless 0  0 0 0  Suicidal thoughts 0  0 0 0  PHQ-9 Score 2  0 2 0  Difficult doing work/chores Not difficult at all   Not difficult at all Not difficult at all        03/05/2023    8:18 AM 01/16/2022    9:14 AM  GAD 7 :  Generalized Anxiety Score  Nervous, Anxious, on Edge 0 0  Control/stop worrying 0 0  Worry too much - different things 0 0  Trouble relaxing 1 0  Restless 0 0  Easily annoyed or irritable 1 0  Afraid - awful might happen 0 0  Total GAD 7 Score 2 0  Anxiety Difficulty Not difficult at all      Review of Systems:   Pertinent items are noted in HPI Denies any headaches, blurred vision, fatigue, shortness of breath, chest pain, abdominal pain, abnormal vaginal discharge/itching/odor/irritation, problems with periods, bowel movements, urination, or intercourse unless otherwise stated above. Pertinent History Reviewed:  Reviewed past medical,surgical, social and family history.  Reviewed problem list, medications and allergies. Physical Assessment:   Vitals:   03/05/23 0812  BP: 113/73  Pulse: 74  Weight: 195 lb (88.5 kg)  Height: 5\' 4"  (1.626 m)  Body mass index is 33.47 kg/m.        Physical Examination:   General appearance - well appearing, and in no distress  Mental status - alert, oriented to person, place, and time  Chest - respiratory effort normal  Heart - normal peripheral perfusion  Breasts - breasts appear normal, no suspicious masses, no skin or nipple changes or axillary nodes  Abdomen - soft, nontender, nondistended, no masses or organomegaly  Pelvic - VULVA: normal appearing vulva with no masses, tenderness or lesions  CERVIX: normal appearing cervix without discharge or lesions, no CMT  UTERUS: uterus is felt to be normal size, shape, consistency and nontender   ADNEXA: No adnexal masses or tenderness noted.  Chaperone present for exam  No results found for this or any previous visit (from the past 24 hours).  Assessment & Plan:  1) Well-Woman Exam Mammogram: in 1 year, or sooner if problems Colonoscopy: per GI Pap: Due 2026 Gardasil: n/a GC/CT: Collected HIV/HCV: Ordered Discussed transitioning from COCs to POPs due to history of HTN. Her BP is well  controlled, but reviewed uncertain risk level and that likely safer options are available. She is agreeable to switching to POPs to help with period pain/heaviness. Rx for slynd sent  Labs/procedures today:   Orders Placed This Encounter  Procedures   HIV antibody (with reflex)   Hepatitis C Antibody   Hepatitis B Surface AntiGEN   RPR    Meds:  Meds ordered this encounter  Medications   valACYclovir (VALTREX) 500 MG tablet    Sig: Take 1 tablet bid x 3 days prn outbreaks.    Dispense:  30 tablet    Refill:  6   Drospirenone (SLYND) 4 MG TABS    Sig: Take 1 tablet (4 mg total) by mouth daily.    Dispense:  90 tablet    Refill:  3   Follow-up: Return in about 1 year (around 03/04/2024) for annual exam or sooner as needed.  Lennart Pall, MD 03/05/2023 8:42 AM

## 2023-03-05 NOTE — Patient Instructions (Addendum)
It was nice meeting you today! You will see your results in the MyChart app within 1 week.   We will start a new birth control pill called Slynd. This does not have estrogen in it. If it is going to be too expensive at the pharmacy, please let me know and we can switch to a different version.   The Menopause Manifesto was written by a gynecologist and talks about what to expect with the menopause transition.

## 2023-03-06 LAB — HEPATITIS B SURFACE ANTIGEN: Hepatitis B Surface Ag: NEGATIVE

## 2023-03-06 LAB — HIV ANTIBODY (ROUTINE TESTING W REFLEX): HIV Screen 4th Generation wRfx: NONREACTIVE

## 2023-03-06 LAB — HEPATITIS C ANTIBODY: Hep C Virus Ab: NONREACTIVE

## 2023-03-06 LAB — RPR: RPR Ser Ql: NONREACTIVE

## 2023-03-08 LAB — CERVICOVAGINAL ANCILLARY ONLY
Chlamydia: NEGATIVE
Comment: NEGATIVE
Comment: NEGATIVE
Comment: NORMAL
Neisseria Gonorrhea: NEGATIVE
Trichomonas: NEGATIVE

## 2023-04-15 ENCOUNTER — Other Ambulatory Visit: Payer: Self-pay | Admitting: Internal Medicine

## 2023-04-15 DIAGNOSIS — E0590 Thyrotoxicosis, unspecified without thyrotoxic crisis or storm: Secondary | ICD-10-CM

## 2023-04-26 DIAGNOSIS — L409 Psoriasis, unspecified: Secondary | ICD-10-CM | POA: Diagnosis not present

## 2023-04-26 DIAGNOSIS — R252 Cramp and spasm: Secondary | ICD-10-CM | POA: Diagnosis not present

## 2023-04-26 DIAGNOSIS — K529 Noninfective gastroenteritis and colitis, unspecified: Secondary | ICD-10-CM | POA: Diagnosis not present

## 2023-04-26 DIAGNOSIS — Z79899 Other long term (current) drug therapy: Secondary | ICD-10-CM | POA: Diagnosis not present

## 2023-04-26 DIAGNOSIS — M129 Arthropathy, unspecified: Secondary | ICD-10-CM | POA: Diagnosis not present

## 2023-05-24 DIAGNOSIS — R945 Abnormal results of liver function studies: Secondary | ICD-10-CM | POA: Diagnosis not present

## 2023-05-24 DIAGNOSIS — K51211 Ulcerative (chronic) proctitis with rectal bleeding: Secondary | ICD-10-CM | POA: Diagnosis not present

## 2023-05-30 DIAGNOSIS — R059 Cough, unspecified: Secondary | ICD-10-CM | POA: Diagnosis not present

## 2023-05-30 DIAGNOSIS — J019 Acute sinusitis, unspecified: Secondary | ICD-10-CM | POA: Diagnosis not present

## 2023-05-30 DIAGNOSIS — Z20822 Contact with and (suspected) exposure to covid-19: Secondary | ICD-10-CM | POA: Diagnosis not present

## 2023-06-18 DIAGNOSIS — N3001 Acute cystitis with hematuria: Secondary | ICD-10-CM | POA: Diagnosis not present

## 2023-06-19 ENCOUNTER — Other Ambulatory Visit: Payer: Self-pay | Admitting: Student

## 2023-06-19 DIAGNOSIS — I10 Essential (primary) hypertension: Secondary | ICD-10-CM

## 2023-07-22 DIAGNOSIS — N390 Urinary tract infection, site not specified: Secondary | ICD-10-CM | POA: Diagnosis not present

## 2023-08-25 ENCOUNTER — Encounter: Payer: Self-pay | Admitting: Obstetrics and Gynecology

## 2023-08-25 ENCOUNTER — Ambulatory Visit: Admitting: Obstetrics and Gynecology

## 2023-08-25 VITALS — BP 118/81 | HR 92 | Ht 64.0 in | Wt 193.0 lb

## 2023-08-25 DIAGNOSIS — N939 Abnormal uterine and vaginal bleeding, unspecified: Secondary | ICD-10-CM | POA: Diagnosis not present

## 2023-08-25 MED ORDER — MEDROXYPROGESTERONE ACETATE 10 MG PO TABS: 10.0000 mg | ORAL_TABLET | Freq: Every day | ORAL | 0 refills | Status: DC

## 2023-08-25 NOTE — Progress Notes (Unsigned)
 Pt states menses began last month around the 21st and has not stopped since.   Pt states she is passing small to medium size clots. Experiencing pain and cramping (scale of 8-9) when bleeding begins.

## 2023-08-25 NOTE — Progress Notes (Unsigned)
   GYNECOLOGY PROGRESS NOTE  History:  48 y.o. [id number] presents to [facility] for problem gyn. Reports she started period 07/28/23 and has been bleeding every since Reports she will bleeding 2-3 days and stop, will come back within 48 hours. Sometimes will have bleeding in AM and not in PM. Reports cramping and clots. She started slynd  12/24 and period has been different every since. She used to have period 7 days once a month, some times can bleed three days, sometimes just spot, or skip a cycle. With this last cycle she has not stopped bleeding. It will fluctuate   The following portions of the patient's history were reviewed and updated as appropriate: allergies, current medications, past family history, past medical history, past social history, past surgical history and problem list. Last pap smear on 01/16/22 was normal, neg HRHPV.  Health Maintenance Due  Topic Date Due   Pneumococcal Vaccine 71-27 Years old (1 of 2 - PCV) Never done   COVID-19 Vaccine (3 - Moderna risk series) 07/25/2019     Review of Systems:  Pertinent items are noted in HPI.   Objective:  Physical Exam Blood pressure 118/81, pulse 92, height 5' 4 (1.626 m), weight 193 lb (87.5 kg), last menstrual period 07/28/2023. VS reviewed, nursing note reviewed,  Constitutional: well developed, well nourished, no distress HEENT: normocephalic CV: normal rate Pulm/chest wall: normal effort Abdomen: soft Neuro: alert and oriented  Skin: warm, dry Psych: affect normal Pelvic exam: deferred  Assessment & Plan:  1. Abnormal uterine bleeding (AUB) (Primary) Dicussed AUB, perimenopausal bleeding, differentials. Discussed other options for bc, to include IUD, she does not desire IUD at this time. Discussed cyclic provera  vs pop. Will stay on pop and try provera . Discussed returning for EMB, if continues will discuss further workup   Future Appointments  Date Time Provider Department Center  09/06/2023  1:30 PM Melanie Spires, MD [facility] None    Susi Eric, FNP

## 2023-09-02 ENCOUNTER — Encounter: Payer: Self-pay | Admitting: Obstetrics

## 2023-09-03 DIAGNOSIS — N390 Urinary tract infection, site not specified: Secondary | ICD-10-CM | POA: Diagnosis not present

## 2023-09-06 ENCOUNTER — Encounter: Payer: Self-pay | Admitting: Family Medicine

## 2023-09-06 ENCOUNTER — Ambulatory Visit: Admitting: Family Medicine

## 2023-09-06 ENCOUNTER — Telehealth: Payer: Self-pay | Admitting: Internal Medicine

## 2023-09-06 ENCOUNTER — Other Ambulatory Visit (HOSPITAL_COMMUNITY)
Admission: RE | Admit: 2023-09-06 | Discharge: 2023-09-06 | Disposition: A | Discharge status: Home / Self Care | Source: Ambulatory Visit | Attending: Family Medicine | Admitting: Family Medicine

## 2023-09-06 VITALS — BP 125/82 | HR 81 | Wt 190.6 lb

## 2023-09-06 DIAGNOSIS — N858 Other specified noninflammatory disorders of uterus: Secondary | ICD-10-CM | POA: Diagnosis not present

## 2023-09-06 DIAGNOSIS — Z3202 Encounter for pregnancy test, result negative: Secondary | ICD-10-CM

## 2023-09-06 DIAGNOSIS — N939 Abnormal uterine and vaginal bleeding, unspecified: Secondary | ICD-10-CM | POA: Diagnosis not present

## 2023-09-06 DIAGNOSIS — N390 Urinary tract infection, site not specified: Secondary | ICD-10-CM | POA: Diagnosis not present

## 2023-09-06 DIAGNOSIS — E0590 Thyrotoxicosis, unspecified without thyrotoxic crisis or storm: Secondary | ICD-10-CM

## 2023-09-06 LAB — POCT URINE PREGNANCY: Preg Test, Ur: NEGATIVE

## 2023-09-06 MED ORDER — NITROFURANTOIN MONOHYD MACRO 100 MG PO CAPS: 100.0000 mg | ORAL_CAPSULE | Freq: Every day | ORAL | 1 refills | Status: DC

## 2023-09-06 NOTE — Telephone Encounter (Signed)
 Let's send a new prescription for methimazole  5 mg every other day- 45 tabs with 1 refill and Let's schedule her in December for a visit.

## 2023-09-06 NOTE — Telephone Encounter (Signed)
 Patient called this afternoon stating she is out of medication and has no refills.She was last seen on  02/26/2023,would you like her to be scheduled for a follow up appointment?

## 2023-09-06 NOTE — Progress Notes (Signed)
 Currently on antibiotics (cefdinir) for UTI and AZO for the pressure.    Pt states she has lost 8 lbs since March without trying.

## 2023-09-06 NOTE — Progress Notes (Signed)
    GYNECOLOGY CLINIC ENDOMETRIAL BIOPSY PROCEDURE NOTE  Sue [name] is a y 48 y.o. [phone] here for endometrial biopsy for abnormal uterine bleeding.  Also reports recurrent UTIs - has had a UTI every month for the past three months. Not noticing specifically after urination. No fevers, chills, signs of pyelo.  Also reports weight loss -- approx 8-9# over past few months. Was previously on Synthroid, has been off over the same time period -- refills ran out and endocrinologist was out of town. Pt plans to make appt soon.  Discussed nature of the procedure and risks and benefits.  Pregnancy test:  Lab Results  Component Value Date   PREGTESTUR NEGATIVE 11/06/2022    Allergies  Allergen Reactions   Ciprofloxacin Anaphylaxis and Swelling   Clindamycin /Lincomycin Anaphylaxis and Swelling   Bactrim [Sulfamethoxazole-Trimethoprim] Swelling    Swelling is of the lips   Amoxicillin Rash    Has patient had a PCN reaction causing immediate rash, facial/tongue/throat swelling, SOB or lightheadedness with hypotension: no Has patient had a PCN reaction causing severe rash involving mucus membranes or skin necrosis: pt had rash -- non severe  Has patient had a PCN reaction that required hospitalization: no Has patient had a PCN reaction occurring within the last 10 years: no If all of the above answers are NO, then may proceed with Cephalosporin use.     Patient given informed consent, signed copy in the chart, time out was performed.    The patient was placed in the lithotomy position and the cervix brought into view with sterile speculum.  Cervix cleansed x 2 with betadine swabs. The uterus was sounded for depth of 8cm. A pipelle was introduced to into the uterus, suction created,  and an endometrial sample was obtained. All equipment was removed and accounted for.  The patient tolerated the procedure well.   Patient given post procedure instructions.  Per patient preference she will be  notified of results by MyChart. Patient to make appt for U/S in 6 weeks for follow up.  Rx for Macrobid  sent in for UTI prophylaxis.   Alain Sor, MD OB Fellow, Faculty Practice [facility], Center for [facility]

## 2023-09-09 ENCOUNTER — Telehealth: Payer: Self-pay

## 2023-09-09 LAB — SURGICAL PATHOLOGY

## 2023-09-09 MED ORDER — METHIMAZOLE 5 MG PO TABS
5.0000 mg | ORAL_TABLET | ORAL | 1 refills | Status: DC
Start: 2023-09-09 — End: 2023-12-31

## 2023-09-09 NOTE — Addendum Note (Signed)
 Addended by: CLEOTILDE ROLIN RAMAN on: 09/09/2023 10:08 AM   Modules accepted: Orders

## 2023-09-09 NOTE — Telephone Encounter (Signed)
 Returned call and advised pt that provider has not reviewed biopsy results yet, office/provider will follow back up. Pt voiced understanding

## 2023-09-09 NOTE — Telephone Encounter (Signed)
 Pt medication has been sent. Please get pt scheduled.

## 2023-09-14 LAB — CYTOLOGY - PAP
Comment: NEGATIVE
Diagnosis: UNDETERMINED — AB
High risk HPV: NEGATIVE

## 2023-09-21 ENCOUNTER — Ambulatory Visit: Payer: Self-pay | Admitting: Family Medicine

## 2023-09-21 NOTE — Progress Notes (Signed)
 Called w results -- ASCUS w neg HPV, ASCCP rec for repeat in 3 yrs. Scant endometrial tissue sampled -- discussed scheduling U/S to determine next steps in plan/need for repeat endometrial bx. Pt understanding of results/plan.

## 2023-09-30 ENCOUNTER — Ambulatory Visit (HOSPITAL_COMMUNITY)
Admission: RE | Admit: 2023-09-30 | Discharge: 2023-09-30 | Disposition: A | Discharge status: Home / Self Care | Source: Ambulatory Visit | Attending: Family Medicine | Admitting: Family Medicine

## 2023-09-30 ENCOUNTER — Other Ambulatory Visit: Payer: Self-pay | Admitting: Family Medicine

## 2023-09-30 DIAGNOSIS — N939 Abnormal uterine and vaginal bleeding, unspecified: Secondary | ICD-10-CM | POA: Diagnosis not present

## 2023-09-30 DIAGNOSIS — N390 Urinary tract infection, site not specified: Secondary | ICD-10-CM

## 2023-10-01 ENCOUNTER — Ambulatory Visit (HOSPITAL_COMMUNITY)

## 2023-10-08 ENCOUNTER — Ambulatory Visit: Payer: Self-pay | Admitting: Family Medicine

## 2023-10-19 DIAGNOSIS — Z79899 Other long term (current) drug therapy: Secondary | ICD-10-CM | POA: Diagnosis not present

## 2023-10-19 DIAGNOSIS — K529 Noninfective gastroenteritis and colitis, unspecified: Secondary | ICD-10-CM | POA: Diagnosis not present

## 2023-10-19 DIAGNOSIS — M129 Arthropathy, unspecified: Secondary | ICD-10-CM | POA: Diagnosis not present

## 2023-10-22 ENCOUNTER — Ambulatory Visit: Admitting: Obstetrics and Gynecology

## 2023-10-22 ENCOUNTER — Encounter: Payer: Self-pay | Admitting: Obstetrics and Gynecology

## 2023-10-22 ENCOUNTER — Other Ambulatory Visit (HOSPITAL_COMMUNITY)
Admission: RE | Admit: 2023-10-22 | Discharge: 2023-10-22 | Disposition: A | Discharge status: Home / Self Care | Source: Ambulatory Visit | Attending: Obstetrics and Gynecology | Admitting: Obstetrics and Gynecology

## 2023-10-22 VITALS — BP 123/79 | HR 75 | Ht 64.0 in | Wt 198.0 lb

## 2023-10-22 DIAGNOSIS — N939 Abnormal uterine and vaginal bleeding, unspecified: Secondary | ICD-10-CM | POA: Insufficient documentation

## 2023-10-22 DIAGNOSIS — Z3202 Encounter for pregnancy test, result negative: Secondary | ICD-10-CM

## 2023-10-22 DIAGNOSIS — N858 Other specified noninflammatory disorders of uterus: Secondary | ICD-10-CM | POA: Diagnosis not present

## 2023-10-22 LAB — POCT URINE PREGNANCY: Preg Test, Ur: NEGATIVE

## 2023-10-22 NOTE — Progress Notes (Signed)
 GYNECOLOGY VISIT  Patient name: Sue [name] MRN [id number]  Date of birth: 03/21/1975 Chief Complaint:   ENDOMETRIAL BIOPSY  History:  Sue [name] is a 48 y.o. [id number] being seen today for EMB.  Was taking nortrell before and having montly cycles and then switched to slynd  in November and in march and everyhing was more unpredictable then.  Painful during biopsy. Has not had a menses for a while now    Past Medical History:  Diagnosis Date   Asthma    mild by PFT's 03/16/1998   Cervical lymphadenopathy    biopsy negativeETTER KYM Angle)   Chronic hypertension    Complete spontaneous abortion    x2   Cough 01/07/2018   GERD (gastroesophageal reflux disease)    Headache    History of abnormal Pap smear    s/p cryo( Dr. 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    Vaginal Pap smear, abnormal    Vitamin B12 deficiency     Past Surgical History:  Procedure Laterality Date   biopsy of lymph node in R neck  2007   CYST EXCISION     2024 Feb.   LAPAROSCOPIC APPENDECTOMY N/A 05/08/2019   Procedure: APPENDECTOMY LAPAROSCOPIC WITH DIAGNOSTIC LAPAROSCOPY;  Surgeon: Teresa Lonni HERO, MD;  Location: [facility];  Service: General;  Laterality: N/A;   LEEP     TONSILLECTOMY Bilateral 04/26/2020   Procedure: TONSILLECTOMY;  Surgeon: Llewellyn Gerard LABOR, DO;  Location: [facility];  Service: ENT;  Laterality: Bilateral;   UMBILICAL HERNIA REPAIR N/A 11/06/2022   Procedure: HERNIA REPAIR UMBILICAL ADULT;  Surgeon:  Polly Cordella LABOR, MD;  Location: [facility];  Service: General;  Laterality: N/A;   WISDOM TOOTH EXTRACTION      The following portions of the patient's history were reviewed and updated as appropriate: allergies, current medications, past family history, past medical history, past social history, past surgical history and problem list.   Health Maintenance:   Last pap     Component Value Date/Time   DIAGPAP (A) 09/06/2023 1326    - Atypical squamous cells of undetermined significance (ASC-US )   DIAGPAP  01/16/2022 1103    - Negative for intraepithelial lesion or malignancy (NILM)   DIAGPAP  12/03/2020 1557    - Negative for Intraepithelial Lesions or Malignancy (NILM)   DIAGPAP - Benign reactive/reparative changes 12/03/2020 1557   HPVHIGH Negative 09/06/2023 1326   HPVHIGH Negative 01/16/2022 1103   HPVHIGH Negative 12/03/2020 1557   ADEQPAP  09/06/2023 1326    Satisfactory but limited for evaluation with scant cellularity, no   ADEQPAP transformation zone component present. 09/06/2023 1326   ADEQPAP  01/16/2022 1103    Satisfactory for evaluation; transformation zone component PRESENT.    High Risk HPV: Positive  Adequacy:  Satisfactory for evaluation, transformation zone component PRESENT  Diagnosis:  Atypical squamous cells of undetermined significance (ASC-US )  Last mammogram: 01/2023 [facility] 1   Review of Systems:  Pertinent items are noted in HPI. Comprehensive review of systems was otherwise negative.   Objective:  Physical Exam BP 123/79   Pulse 75   Ht 5' 4 (1.626 m)   Wt 198 lb (89.8 kg)   LMP 10/06/2023 (Exact Date)   BMI 33.99 kg/m    Physical Exam Vitals and nursing note reviewed. Exam conducted with a chaperone present.  Constitutional:      Appearance: Normal appearance.  HENT:     Head: Normocephalic and atraumatic.  Pulmonary:     Effort: Pulmonary effort is normal.     Breath sounds: Normal breath sounds.  Genitourinary:    General: Normal  vulva.     Exam position: Lithotomy position.     Vagina: Normal.     Cervix: Normal.  Skin:    General: Skin is warm and dry.  Neurological:     General: No focal deficit present.     Mental Status: She is alert.  Psychiatric:        Mood and Affect: Mood normal.        Behavior: Behavior normal.        Thought Content: Thought content normal.        Judgment: Judgment normal.    FINAL MICROSCOPIC DIAGNOSIS:   A. ENDOMETRIUM, BIOPSY:  Scant inactive to atrophic endometrial epithelium and reactive  metaplasia suggestive of bleeding (see comment)  Abundant benign endocervical epithelium and endocervix   Labs and Imaging US  PELVIC COMPLETE WITH TRANSVAGINAL Result Date: 10/06/2023 CLINICAL DATA:  Abnormal uterine bleeding. EXAM: TRANSABDOMINAL AND TRANSVAGINAL ULTRASOUND OF PELVIS TECHNIQUE: Both transabdominal and transvaginal ultrasound examinations of the pelvis were performed. Transabdominal technique was performed for global imaging of the pelvis including uterus, ovaries, adnexal regions, and pelvic cul-de-sac. It was necessary to proceed with endovaginal exam following the transabdominal exam to visualize the anatomy. COMPARISON:  02/12/2022. FINDINGS: Uterus Measurements: 10.1 x 4.9 x 6.7 cm = volume: 174.1 mL. No fibroids or other mass visualized. Endometrium Thickness: 6.6 mm. A trace amount of fluid or blood products is noted at the endometrial fundus. No abnormal vascularity is seen. Right ovary Measurements: 2.5 x 1.7 x 1.3 cm = volume: 2.9 mL. Normal appearance/no adnexal mass. Left ovary Measurements: 2.9 x 2.1 x 1.6 cm = volume: 5.3 mL. Normal appearance/no adnexal mass. Other findings No abnormal free fluid. IMPRESSION: Trace amount of fluid or blood products in the endometrial fundus. Electronically Signed   By: Leita Birmingham M.D.   On: 10/06/2023 20:36   Endometrial Biopsy Procedure  Patient identified, informed consent performed,  indication reviewed, consent signed.   Reviewed risk of perforation, pain, bleeding, insufficient sample, etc were reviewd. Time out was performed.  Urine pregnancy test negative.  Speculum placed in the vagina.  Cervix visualized.  Cleaned with Betadine x 2.  Anterior cervix grasped anteriorly with a single tooth tenaculum.  Paracervical block was not administered.  Endometrial pipelle was used to draw up 1cc of 1% lidocaine , introduced into the cervical os and instilled into the endometrial cavity.  The pipelle was passed twice without difficulty and sample obtained. Tenaculum was removed, good hemostasis noted.  Patient tolerated procedure well.  Patient was given post-procedure instructions.      Assessment & Plan:   1. Abnormal uterine bleeding (AUB) (Primary) Now s/p uncomplicated repeat EMB. Discussed that likely scant EMB due to perimenopause given that scant sample demonstrated atrophic endometrium. Will follow up surgical pathology. Will continue slynd  for now. Will see if bleeding resumes  but may not return.  - POCT urine pregnancy - Surgical pathology   Carter Quarry, MD [facility] for [facility], [facility]

## 2023-10-22 NOTE — Progress Notes (Signed)
 48 y.o. GYN presents for ENDO Bx.   UPT  Negative

## 2023-10-24 NOTE — Progress Notes (Signed)
 Chief Complaint  Patient presents with  . Neck - Follow-up     HISTORY OF PRESENT ILLNESS: 48 y.o. 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    History of Present Illness The patient presents for neck pain and elbow pain.  On 10/12/2023, she woke up with neck pain that restricted her movement and has been persistent for the past two weeks. The pain has slightly subsided in the last two days but continues to radiate from her neck to the back of her head. She has been managing the pain with Tylenol , ibuprofen , and alternating heat and ice applications, which have provided some relief. She has previously tried meloxicam , but it caused stomach discomfort. She has a history of tension headaches, but this is the first time she has experienced such neck pain.  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 The pain, described as needle-like, started approximately 2.5 weeks ago and is severe enough to halt her activities. However, stretching seems to alleviate the discomfort. She has not experienced any knee pain since starting Remicade.  She experienced recurrent urinary tract infections in April, May, and June 2025, accompanied by intermittent bleeding for nearly three months. Two biopsies and an ultrasound were performed to investigate the cause, with perimenopause being a potential diagnosis. She also reported an unexplained weight loss of 7 to 8 pounds.  FAMILY HISTORY She had a sister who passed away from an aneurysm at the age of 77.     I reviewed the following elements in the electronic chart: Problem List, Medications, Allergies, History (Medical, Surgical, Tobacco, Family)   CURRENT MEDICATIONS: Current Outpatient Medications on File Prior to Visit  Medication Sig Dispense Refill  .  acetaminophen  (TYLENOL ) 500 mg tablet Take  by mouth.    SABRA adalimumab (Humira,CF, Pen) 40 mg/0.4 mL pnkt injection Inject 0.4 mL (40 mg total) under the skin once a week. 4 Pen 5  . albuterol  HFA (Ventolin  HFA) 90 mcg/actuation inhaler Inhale 2 puffs.    . amLODIPine  (NORVASC ) 5 mg tablet Take 5 mg by mouth Once Daily.  1  . cyanocobalamin  (VITAMIN B12) 1,000 mcg/mL injection Inject 1,000 mcg into the muscle every 30 (thirty) days.    . fluticasone  propionate (FLONASE ) 50 mcg/spray nasal spray SHAKE LIQUID AND USE 2 SPRAYS IN EACH NOSTRIL DAILY    . levocetirizine (XYZAL ) 5 mg tablet Take 5 mg by mouth Once Daily.    . mesalamine  (CANASA ) 1,000 mg suppository Insert 1,000 mg into the rectum.    . methIMAzole  (TAPAZOLE ) 5 mg tablet take 1 tablet by mouth once daily  0  . multivitamin cap Take 1 capsule by mouth Once Daily.    . norethindrone -ethin estradioL  (Nortrel  1/35, 28,) 1-35 mg-mcg tab TAKE 1 TABLET BY MOUTH DAILY    . valACYclovir  (VALTREX ) 500 mg tablet Take 500 mg by mouth Once Daily.    . [DISCONTINUED] celecoxib (CeleBREX) 200 mg capsule Take 1 capsule (200 mg) by mouth in the morning and at bedtime.     No current facility-administered medications on file prior to visit.       REVIEW OF SYSTEMS :  A complete review of systems was performed and the pertinent positives and negatives can be found in the HPI   PHYSICAL EXAM: BP 119/78 (Patient Position: Sitting)   Pulse 82   Temp 97.6 F (36.4  C)   Resp 14   Ht 1.626 m (5' 4)   Wt 87.8 kg (193 lb 9.6 oz)   SpO2 100%   BMI 33.23 kg/m   General:  Well appearing, well nourished in no distress.  Skin: No sclerodactyly, telangiectasias or digital pits.   Hair:  Normal texture and distribution   Head:  Normocephalic, atraumatic   Eyes:  Conjunctivae clear, sclerae non-icteric   Mouth:  Mucous membranes moist, no mucosal lesions.   Neck:  Supple, without lesions or adenopathy; tenderness in the muscles of her upper lateral  left neck Heart:  Regular rate, no murmur or gallop   Lungs:  Clear to auscultation, no wheezes or crackles.   Back:  Spine non-tender, SI Joints without tenderness.  Extremities:  no LE edema, no cyanosis or clubbing. Musculoskeletal:  Normal gait, MCPs and PIPs unremarkable.  Wrists without swelling.  Elbows unremarkable.  Shoulders with full and painless glenohumeral range of motion.  Knees with coarse crepitus bilaterally but no swelling.  Ankles and MTPs unremarkable Lymphatics:  No lymphadenopathy in cervical area   Psychiatric:  Good judgment and insight, normal mood and affect.    Lab:   I have reviewed the labs below and based on the results, will not make any changes in the patient's immunosuppression:  Lab Results  Component Value Date   WBC 9.00 10/19/2023   HGB 13.8 10/19/2023   HCT 41.1 10/19/2023   MCV 88.2 10/19/2023   PLT 301 10/19/2023    Lab Results  Component Value Date   CREATININE 0.86 10/19/2023    Lab Results  Component Value Date   ALT 19 10/19/2023   AST 17 10/19/2023   ALP 95 10/19/2023   BILITOT 0.3 10/19/2023      ASSESSMENT: 48 y.o. 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  Overall, she is doing quite well on her current regimen-no indication to change her immunosuppressive regimen.  She previously had had some palmar psoriasis from the Humira but switching to different agents led to flare of her IBD.  She has done better since restarting the Humira without any recurrence.  In terms of her neck symptoms, they sound more muscular based on history and exam.  Will do a trial of scheduled Celebrex for 14 days to see if that quiets things down.  She can stop it earlier if the symptoms resolve or drop it down to once daily if she does not need it twice daily or her stomach is hurting.  She was on meloxicam  in the past and had  severe GI discomfort.  She did fine with Celebrex.  PLAN:  1.  Continue Humira 40 mg every week. 2.  Check labs for high risk medication monitoring 3.  Trial of Celebrex x 14 days for neck symptoms.  4.  Return to clinic in 6 months  No orders of the defined types were placed in this encounter.

## 2023-10-25 DIAGNOSIS — M129 Arthropathy, unspecified: Secondary | ICD-10-CM | POA: Diagnosis not present

## 2023-10-25 DIAGNOSIS — Z79899 Other long term (current) drug therapy: Secondary | ICD-10-CM | POA: Diagnosis not present

## 2023-10-25 DIAGNOSIS — M542 Cervicalgia: Secondary | ICD-10-CM | POA: Diagnosis not present

## 2023-10-25 DIAGNOSIS — K529 Noninfective gastroenteritis and colitis, unspecified: Secondary | ICD-10-CM | POA: Diagnosis not present

## 2023-10-25 DIAGNOSIS — L409 Psoriasis, unspecified: Secondary | ICD-10-CM | POA: Diagnosis not present

## 2023-10-26 LAB — SURGICAL PATHOLOGY

## 2023-10-29 ENCOUNTER — Ambulatory Visit: Payer: Self-pay | Admitting: Obstetrics and Gynecology

## 2023-11-13 ENCOUNTER — Other Ambulatory Visit: Payer: Self-pay | Admitting: Obstetrics and Gynecology

## 2023-11-13 DIAGNOSIS — Z30011 Encounter for initial prescription of contraceptive pills: Secondary | ICD-10-CM

## 2023-12-06 ENCOUNTER — Other Ambulatory Visit: Payer: Self-pay | Admitting: Medical Genetics

## 2023-12-13 ENCOUNTER — Ambulatory Visit: Payer: Self-pay | Admitting: Student

## 2023-12-13 ENCOUNTER — Encounter: Payer: Self-pay | Admitting: Student

## 2023-12-13 VITALS — BP 126/87 | HR 85 | Temp 98.4°F | Ht 64.0 in | Wt 191.4 lb

## 2023-12-13 DIAGNOSIS — I10 Essential (primary) hypertension: Secondary | ICD-10-CM

## 2023-12-13 DIAGNOSIS — Z5321 Procedure and treatment not carried out due to patient leaving prior to being seen by health care provider: Secondary | ICD-10-CM

## 2023-12-13 NOTE — Progress Notes (Signed)
 Left without being seen by me. Attempted to call patient with no answer x 3.

## 2023-12-17 DIAGNOSIS — R9431 Abnormal electrocardiogram [ECG] [EKG]: Secondary | ICD-10-CM | POA: Diagnosis not present

## 2023-12-17 DIAGNOSIS — E785 Hyperlipidemia, unspecified: Secondary | ICD-10-CM | POA: Diagnosis not present

## 2023-12-17 DIAGNOSIS — M069 Rheumatoid arthritis, unspecified: Secondary | ICD-10-CM | POA: Diagnosis not present

## 2023-12-17 DIAGNOSIS — K529 Noninfective gastroenteritis and colitis, unspecified: Secondary | ICD-10-CM | POA: Diagnosis not present

## 2023-12-17 DIAGNOSIS — I10 Essential (primary) hypertension: Secondary | ICD-10-CM | POA: Diagnosis not present

## 2023-12-17 DIAGNOSIS — J4520 Mild intermittent asthma, uncomplicated: Secondary | ICD-10-CM | POA: Diagnosis not present

## 2023-12-17 DIAGNOSIS — R0789 Other chest pain: Secondary | ICD-10-CM | POA: Diagnosis not present

## 2023-12-20 ENCOUNTER — Other Ambulatory Visit: Payer: Self-pay | Admitting: Obstetrics

## 2023-12-20 ENCOUNTER — Other Ambulatory Visit: Payer: Self-pay

## 2023-12-20 DIAGNOSIS — E538 Deficiency of other specified B group vitamins: Secondary | ICD-10-CM

## 2023-12-20 DIAGNOSIS — Z1231 Encounter for screening mammogram for malignant neoplasm of breast: Secondary | ICD-10-CM

## 2023-12-20 NOTE — Telephone Encounter (Signed)
 Patient last seen 12/15/22. Patient was scheduled for 12/13/23 with Dr.Patel, patient left without being seen due to having to wait longer than she anticipated. I offered the patient a appointment for this Friday she stated she will have to check her calendar to make sure she is free. Patient stated she would give us  a call back to schedule a appointment.

## 2023-12-21 ENCOUNTER — Other Ambulatory Visit: Payer: Self-pay | Admitting: Student

## 2023-12-21 ENCOUNTER — Ambulatory Visit: Admitting: Internal Medicine

## 2023-12-21 ENCOUNTER — Encounter: Payer: Self-pay | Admitting: Internal Medicine

## 2023-12-21 ENCOUNTER — Other Ambulatory Visit

## 2023-12-21 VITALS — BP 120/70 | HR 71 | Ht 64.0 in | Wt 193.6 lb

## 2023-12-21 DIAGNOSIS — E0590 Thyrotoxicosis, unspecified without thyrotoxic crisis or storm: Secondary | ICD-10-CM | POA: Diagnosis not present

## 2023-12-21 DIAGNOSIS — I10 Essential (primary) hypertension: Secondary | ICD-10-CM

## 2023-12-21 DIAGNOSIS — E041 Nontoxic single thyroid nodule: Secondary | ICD-10-CM

## 2023-12-21 MED ORDER — CYANOCOBALAMIN 1000 MCG/ML IJ SOLN: 1000.0000 ug | INTRAMUSCULAR | 0 refills | Status: AC

## 2023-12-21 NOTE — Telephone Encounter (Signed)
 Pt left before seen by the doctor on 12/13/23. I called pt to re-schedule the appt; no answer, left message on vm. LOV - 12/15/2022.

## 2023-12-21 NOTE — Progress Notes (Signed)
 Patient ID: Sue [name], female   DOB: [birth year], 48 y.o.   MRN: [id number]  HPI  Sue [name] is a 48 y.o.-year-old female, returning for follow-up for thyrotoxicosis.  She previously saw Dr. Kassie, last visit 10 mo ago. Her mother, Sue [name], is also my pt.  Interim history: Continues to have hot flashes and fatigue. She had 1 instance of palpitations recently. She had an elevated HbA1c 5.9% recently.  Reviewed and addended history: Patient has been having mild subclinical thyrotoxicosis intermittently since 2009.   She was started on methimazole  in 2020, during a pregnancy.  She has no complaints at this visit other than red eyes and occasional blurry vision when driving.  She sees Dr. Octavia.  At our visit from 02/2023, she was  on methimazole  5 mg daily >> we decreased the dose to 5 mg every other day.  Unfortunately, she did not return for labs afterwards...  She ran out of Methimazole  08/2023 and did not restart.  I reviewed pt's thyroid  tests: Lab Results  Component Value Date   TSH 0.84 02/26/2023   TSH 0.992 12/15/2022   TSH 0.79 02/27/2022   TSH 0.48 04/22/2021   TSH 0.60 07/26/2020   TSH 0.71 10/27/2019   TSH 1.29 06/27/2019   TSH 0.655 09/12/2018   TSH 0.25 (L) 04/07/2018   TSH 0.60 05/14/2017   FREET4 1.0 02/26/2023   FREET4 1.1 02/27/2022   FREET4 0.95 04/22/2021   FREET4 0.78 07/26/2020   FREET4 1.1 10/27/2019   FREET4 1.02 06/27/2019   FREET4 1.26 09/12/2018   FREET4 1.06 04/07/2018   FREET4 0.78 05/14/2017   FREET4 0.58 (L) 03/11/2017   T3FREE 3.1 02/26/2023   T3FREE 3.2 02/27/2022   Antithyroid antibodies: Lab Results  Component Value Date   TSI <89 02/26/2023   TSI <89 02/27/2022   Pt denies: - feeling nodules in neck - hoarseness - dysphagia - choking  She continues to have: - Hot flashes - fatigue  She denies: - tremors - anxiety - hyperdefecation - weight loss  Pt has a FH of thyroid  ds. In her sister and mother. No FH of  thyroid  cancer. No h/o radiation tx to head or neck. No steroid use. No herbal supplements. No Biotin use.  Pt. 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  She does mention having had slightly elevated HbA1c recently, at 5.9%.  Previously: Lab Results  Component Value Date   HGBA1C 5.4 04/27/2019   ROS: + see HPI  Past Medical History:  Diagnosis Date   Asthma    mild by PFT's 03/16/1998   Cervical lymphadenopathy    biopsy negativeETTER KYM Angle)   Chronic hypertension    Complete spontaneous abortion    x2   Cough 01/07/2018   GERD (gastroesophageal reflux disease)    Headache    History of abnormal Pap smear    s/p cryo( Dr. Gordan)   History of ELISA positive for HSV 10/06/2016   History of kidney stones    passed stones   HSV (herpes simplex virus) anogenital infection    Hypertension    Hypothyroidism    Normal vaginal delivery    x3   Rheumatoid arthritis (HCC)    Throat pain in adult    Thyroid  disease affecting pregnancy    Tonsillar abscess    ULCERATIVE PROCTITIS 12/26/2007   Flex sig 12/12/07 Dr Buccini was c/w ulcerative proctitis.Started on  mesalamine  suppositories and all sxs and pathology resolved on repeat flex sig 06/2008.    Vaginal Pap smear, abnormal    Vitamin B12 deficiency    Past Surgical History:  Procedure Laterality Date   biopsy of lymph node in R neck  2007   CYST EXCISION     2024 Feb.   LAPAROSCOPIC APPENDECTOMY N/A 05/08/2019   Procedure: APPENDECTOMY LAPAROSCOPIC WITH DIAGNOSTIC LAPAROSCOPY;  Surgeon: Teresa Lonni HERO, MD;  Location: [facility];  Service: General;  Laterality: N/A;   LEEP     TONSILLECTOMY Bilateral 04/26/2020   Procedure: TONSILLECTOMY;  Surgeon: Llewellyn Gerard LABOR, DO;  Location: [facility];  Service: ENT;  Laterality: Bilateral;   UMBILICAL HERNIA REPAIR N/A 11/06/2022   Procedure: HERNIA REPAIR  UMBILICAL ADULT;  Surgeon: Polly Cordella LABOR, MD;  Location: [facility];  Service: General;  Laterality: N/A;   WISDOM TOOTH EXTRACTION     Social History   Socioeconomic History   Marital status: Single    Spouse name: Not on file   Number of children: Not on file   Years of education: Not on file   Highest education level: Not on file  Occupational History   Not on file  Tobacco Use   Smoking status: Never   Smokeless tobacco: Never  Vaping Use   Vaping status: Never Used  Substance and Sexual Activity   Alcohol use: Yes    Alcohol/week: 0.0 standard drinks of alcohol    Comment: occasional beer/wine   Drug use: No   Sexual activity: Yes    Partners: Male    Birth control/protection: Pill, OCP  Other Topics Concern   Not on file  Social History Narrative   Not on file   Social Drivers of Health   Financial Resource Strain: Low Risk  (12/15/2023)   Overall Financial Resource Strain (CARDIA)    Difficulty of Paying Living Expenses: Not very hard  Food Insecurity: No Food Insecurity (12/15/2023)   Hunger Vital Sign    Worried About Running Out of Food in the Last Year: Never true    Ran Out of Food in the Last Year: Never true  Transportation Needs: No Transportation Needs (12/15/2023)   PRAPARE - Administrator, Civil Service (Medical): No    Lack of Transportation (Non-Medical): No  Physical Activity: Insufficiently Active (12/15/2023)   Exercise Vital Sign    Days of Exercise per Week: 4 days    Minutes of Exercise per Session: 20 min  Stress: Stress Concern Present (12/15/2023)   [employer] of Occupational Health - Occupational Stress Questionnaire    Feeling of Stress: To some extent  Social Connections: Moderately Isolated (12/15/2023)   Social Connection and Isolation Panel    Frequency of Communication with Friends and Family: More than three times a week    Frequency of Social Gatherings with Friends and Family: Twice a week    Attends Religious  Services: More than 4 times per year    Active Member of [employer] or Organizations: No    Attends Banker Meetings: Patient declined    Marital Status: Never married  Intimate Partner Violence: Not At Risk (12/15/2023)   Humiliation, Afraid, Rape, and Kick questionnaire    Fear of Current or Ex-Partner: No    Emotionally Abused: No    Physically Abused: No    Sexually Abused: No   Current Outpatient Medications on File Prior to Visit  Medication Sig Dispense Refill   albuterol  (VENTOLIN  HFA) 108 (  90 Base) MCG/ACT inhaler Inhale 1-2 puffs into the lungs every 6 (six) hours as needed for wheezing or shortness of breath. 8 g 3   amLODipine  (NORVASC ) 10 MG tablet TAKE 1 TABLET BY MOUTH EVERY DAY 90 tablet 1   Brimonidine Tartrate (LUMIFY) 0.025 % SOLN Place 1 drop into both eyes as needed (irritation). (Patient not taking: Reported on 09/06/2023)     calcium  carbonate (TUMS - DOSED IN MG ELEMENTAL CALCIUM ) 500 MG chewable tablet Chew 1-2 tablets by mouth 3 (three) times daily as needed for indigestion or heartburn.     cyanocobalamin  (VITAMIN B12) 1000 MCG/ML injection Inject 1 mL (1,000 mcg total) into the skin every 30 (thirty) days. 10 mL 0   Drospirenone  (SLYND ) 4 MG TABS Take 1 tablet (4 mg total) by mouth daily before breakfast. 84 tablet 3   HUMIRA, 2 PEN, 40 MG/0.4ML pen Inject 40 mg into the skin once a week.     mesalamine  (CANASA ) 1000 MG suppository Place 1,000 mg rectally at bedtime as needed (ulcerative proctitis).     methimazole  (TAPAZOLE ) 5 MG tablet Take 1 tablet (5 mg total) by mouth every other day. 45 tablet 1   methocarbamol  (ROBAXIN ) 500 MG tablet Take 1 tablet (500 mg total) by mouth 2 (two) times daily as needed for muscle spasms. (Patient not taking: Reported on 09/06/2023) 20 tablet 0   Multiple Vitamin (MULTIVITAMIN WITH MINERALS) TABS tablet Take 1 tablet by mouth daily.     nitrofurantoin , macrocrystal-monohydrate, (MACROBID ) 100 MG capsule Take 1 capsule (100  mg total) by mouth at bedtime. 30 capsule 1   valACYclovir  (VALTREX ) 500 MG tablet Take 1 tablet bid x 3 days prn outbreaks. 30 tablet 6   No current facility-administered medications on file prior to visit.   Allergies  Allergen Reactions   Ciprofloxacin Anaphylaxis and Swelling   Clindamycin /Lincomycin Anaphylaxis and Swelling   Bactrim [Sulfamethoxazole-Trimethoprim] Swelling    Swelling is of the lips   Amoxicillin Rash    Has patient had a PCN reaction causing immediate rash, facial/tongue/throat swelling, SOB or lightheadedness with hypotension: no Has patient had a PCN reaction causing severe rash involving mucus membranes or skin necrosis: pt had rash -- non severe  Has patient had a PCN reaction that required hospitalization: no Has patient had a PCN reaction occurring within the last 10 years: no If all of the above answers are NO, then may proceed with Cephalosporin use.    Family History  Problem Relation Age of Onset   Ulcerative colitis Mother    Hypertension Mother    Diabetes Father    Heart attack Father    Hypertension Father    Diabetes Sister    Thyroid  disease Sister    Heart attack Maternal Grandmother    Stroke Maternal Grandmother    Breast cancer Neg Hx    PE: BP 120/70   Pulse 71   Ht 5' 4 (1.626 m)   Wt 193 lb 9.6 oz (87.8 kg)   SpO2 98%   BMI 33.23 kg/m  Wt Readings from Last 10 Encounters:  12/21/23 193 lb 9.6 oz (87.8 kg)  12/13/23 191 lb 6.4 oz (86.8 kg)  10/22/23 198 lb (89.8 kg)  09/06/23 190 lb 9.6 oz (86.5 kg)  08/25/23 193 lb (87.5 kg)  03/05/23 195 lb (88.5 kg)  02/26/23 196 lb 9.6 oz (89.2 kg)  12/20/22 198 lb (89.8 kg)  12/15/22 198 lb 3.2 oz (89.9 kg)  11/06/22 195 lb (88.5 kg)  Constitutional: overweight, in NAD Eyes:  EOMI, no exophthalmos, no lid lag, no stare ENT: no neck masses, no thyromegaly, no cervical lymphadenopathy Cardiovascular: RRR, No MRG Respiratory: CTA B Musculoskeletal: no deformities Skin:no  rashes Neurological: no tremor with outstretched hands  ASSESSMENT: 1. Thyrotoxicosis  2.  Thyroid  nodule  3.  Elevated HbA1c  PLAN:  1. Patient with history of mild thyrotoxicosis dating back to 2009 per review of the chart, with intermittently normal TFTs afterwards.  She mentioned that she was advised about the diagnosis of hyperthyroidism approximately 6 years ago during pregnancy but she was asymptomatic.  She was started on methimazole  at that time and we continued 5 mg of methimazole  daily until last visit, when TFTs and TSIs were normal, and I advised her to decrease the methimazole  dose to 5 mg every other day.  She tolerates methimazole  well, without side effects.  Her liver tests and WBC were normal in 10/2023 per review of outside records. - We did discuss about etiologies for her thyrotoxicosis to include Graves' disease (however, TSI's were normal), thyroiditis, toxic multinodular goiter/toxic adenoma.  She did not appear to have exogenous causes for her low TSH.  Our working diagnosis is mild Graves' disease. - She does not have thyrotoxic symptoms: Unintentional weight loss, tremors, anxiety, hyperdefecation.  She does have hot flashes and night sweats, most likely related to perimenopause, despite the fact that she is on OCP.  She had 1 episode of palpitations but this is not a common occurrence. - No active signs of Graves' ophthalmopathy including double vision, eye pain, blurry vision - At today's visit we will recheck her TFTs and change the methimazole  dose accordingly.  We discussed that she needs to come back for labs in 4 to 6 weeks after any change in dose of methimazole . - I plan to see her back in a year or possibly sooner for labs  2. Thyroid  nodule -Patient has a history of a subcentimeter left thyroid  nodule incidentally seen on a neck CT from 2014 -She had a repeat neck CT scan in 01/2020 and that showed stability of the nodule, which did not meet criteria for  further ultrasound investigation - No neck compression symptoms - No nodules felt on palpation of her neck today - Will continue to follow her expectantly  3.  Elevated HbA1c - Recently, at 5.9%.  We discussed that this is in the low range for prediabetes.  However, since she only had 1 value above target, she does not have a diagnosis yet. - We discussed about starting to improve diet by stopping sweet drinks, desserts and sweets between meals, reducing fried foods.  I also recommended consistent exercise. - We discussed that prediabetes (insulin resistance) is not uncommon during perimenopause, but it is reversible with the above measures - Plan to repeat another HbA1c in 3 to 4 months  Orders Placed This Encounter  Procedures   TSH   T4, free   T3, free   Thyroid  stimulating immunoglobulin    Lela Fendt, MD PhD [facility]

## 2023-12-21 NOTE — Patient Instructions (Addendum)
 Please continue off Methimazole .  Please stop at the lab.  Please return for a follow-up appointment in 1 year.

## 2023-12-22 ENCOUNTER — Ambulatory Visit: Payer: Self-pay | Admitting: Internal Medicine

## 2023-12-23 LAB — TSH: TSH: 0.48 m[IU]/L

## 2023-12-23 LAB — T4, FREE: Free T4: 1 ng/dL (ref 0.8–1.8)

## 2023-12-23 LAB — THYROID STIMULATING IMMUNOGLOBULIN: TSI: <89 %{baseline} (ref <140)

## 2023-12-23 LAB — T3, FREE: T3, Free: 3.3 pg/mL (ref 2.3–4.2)

## 2023-12-27 ENCOUNTER — Other Ambulatory Visit

## 2023-12-30 ENCOUNTER — Ambulatory Visit: Admitting: Student

## 2023-12-31 ENCOUNTER — Ambulatory Visit

## 2023-12-31 ENCOUNTER — Ambulatory Visit (HOSPITAL_COMMUNITY)
Admission: RE | Admit: 2023-12-31 | Discharge: 2023-12-31 | Disposition: A | Discharge status: Home / Self Care | Source: Ambulatory Visit | Attending: Family Medicine | Admitting: Family Medicine

## 2023-12-31 VITALS — BP 117/76 | HR 72 | Temp 98.2°F | Ht 64.0 in | Wt 193.0 lb

## 2023-12-31 DIAGNOSIS — Z79899 Other long term (current) drug therapy: Secondary | ICD-10-CM | POA: Diagnosis not present

## 2023-12-31 DIAGNOSIS — M069 Rheumatoid arthritis, unspecified: Secondary | ICD-10-CM

## 2023-12-31 DIAGNOSIS — R5383 Other fatigue: Secondary | ICD-10-CM

## 2023-12-31 DIAGNOSIS — R051 Acute cough: Secondary | ICD-10-CM

## 2023-12-31 DIAGNOSIS — R002 Palpitations: Secondary | ICD-10-CM | POA: Diagnosis not present

## 2023-12-31 DIAGNOSIS — Z0000 Encounter for general adult medical examination without abnormal findings: Secondary | ICD-10-CM

## 2023-12-31 DIAGNOSIS — R232 Flushing: Secondary | ICD-10-CM | POA: Diagnosis not present

## 2023-12-31 MED ORDER — VENLAFAXINE HCL ER 37.5 MG PO CP24: 37.5000 mg | ORAL_CAPSULE | Freq: Every day | ORAL | 2 refills | Status: AC

## 2023-12-31 NOTE — Assessment & Plan Note (Signed)
 Deferring flu shot at this time. Up to date on CMP, CBC with humera (10/19/2023 WNL). A1C 2021 5.4. Lipid profile 12/15/2022 total cholesterol 217, TG 288, LDL 126. The 10-year ASCVD risk score (Arnett DK, et al., 2019) is: 3.3% - A1C ordered

## 2023-12-31 NOTE — Assessment & Plan Note (Signed)
 Complaining of dry cough with constant clearing of throat.  Has history of acid reflux not on any prescription management at this time.  Has tried pantoprazole  but stated it did not work for her.  Has not tried omeprazole .  Endorses postnasal drip symptoms such as feeling drip down back of throat.  On physical exam cobblestoning possibly present on soft palate which would be consistent with postnasal drip, however patient said cough is most bothersome at night when laying down and first thing in the morning which would be most consistent with GERD.  Will treat as if postnasal drip with the below plan, and if no improvement will initiate omeprazole  management. -Flonase  daily (1 spray in each nostril) and Zyrtec  daily

## 2023-12-31 NOTE — Progress Notes (Signed)
 CC: yearly check-up   HPI:  [name] is a 48 y.o. 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 Please see problem based assessment and plan for additional details.  Last Pertinent Labs Documented:     Latest Ref Rng & Units 11/05/2022    1:56 PM 04/26/2020    8:24 AM 01/14/2020    7:44 AM  BMP  Glucose 70 - 99 mg/dL 885  97  879   BUN 6 - 20 mg/dL 11  7  8    Creatinine 0.44 - 1.00 mg/dL 9.02  9.16  9.21   Sodium 135 - 145 mmol/L 138  138  136   Potassium 3.5 - 5.1 mmol/L 4.1  3.4  3.3   Chloride 98 - 111 mmol/L 104  106  98   CO2 22 - 32 mmol/L 23  20  26    Calcium  8.9 - 10.3 mg/dL 9.2  8.5  9.1        Latest Ref Rng & Units 12/15/2022    3:26 PM 12/10/2020    5:00 PM 04/26/2020    8:24 AM  CBC  WBC 3.4 - 10.8 x10E3/uL 8.8  7.3  6.5   Hemoglobin 11.1 - 15.9 g/dL 86.4  86.3  86.1   Hematocrit 34.0 - 46.6 % 40.9  40.3  42.0   Platelets 150 - 450 x10E3/uL 365  325  357     Lab Results  Component Value Date   HGBA1C 5.4 04/27/2019   HGBA1C 5.2 09/15/2016   HGBA1C 5.4 01/02/2016     Past Medical History:  Diagnosis Date   Asthma    mild by PFT's 03/16/1998   Cervical lymphadenopathy    biopsy negative( C. Newman)   Chronic hypertension    Complete spontaneous abortion    x2   Cough 01/07/2018   GERD (gastroesophageal reflux disease)    Headache    History of abnormal Pap smear    s/p cryo( Dr. Gordan)   History of ELISA positive for HSV 10/06/2016   History of kidney stones    passed stones   HSV (herpes simplex virus) anogenital infection    Hypertension    Hypothyroidism    Normal vaginal delivery    x3   Rheumatoid arthritis (HCC)    Throat pain in adult    Thyroid  disease affecting pregnancy    Tonsillar abscess    ULCERATIVE PROCTITIS 12/26/2007    Flex sig 12/12/07 Dr Buccini was c/w ulcerative proctitis.Started on mesalamine  suppositories and all sxs and pathology resolved on repeat flex sig 06/2008.    Vaginal Pap smear, abnormal    Vitamin B12 deficiency     Current Outpatient Medications on File Prior to Visit  Medication Sig Dispense Refill   albuterol  (VENTOLIN  HFA) 108 (90 Base) MCG/ACT inhaler Inhale 1-2 puffs into the lungs every 6 (six) hours as needed for wheezing or shortness of breath. 8 g 3   amLODipine  (NORVASC ) 10 MG tablet TAKE 1 TABLET BY MOUTH EVERY DAY 90 tablet 1   Brimonidine Tartrate (LUMIFY) 0.025 % SOLN Place 1 drop into both eyes as needed (irritation). (Patient not taking: Reported on 09/06/2023)     calcium  carbonate (TUMS - DOSED IN MG ELEMENTAL CALCIUM ) 500 MG chewable tablet Chew 1-2 tablets by mouth 3 (three) times daily as needed for indigestion or heartburn.  cyanocobalamin  (VITAMIN B12) 1000 MCG/ML injection Inject 1 mL (1,000 mcg total) into the skin every 30 (thirty) days. 10 mL 0   Drospirenone  (SLYND ) 4 MG TABS Take 1 tablet (4 mg total) by mouth daily before breakfast. 84 tablet 3   HUMIRA, 2 PEN, 40 MG/0.4ML pen Inject 40 mg into the skin once a week.     Multiple Vitamin (MULTIVITAMIN WITH MINERALS) TABS tablet Take 1 tablet by mouth daily.     valACYclovir  (VALTREX ) 500 MG tablet Take 1 tablet bid x 3 days prn outbreaks. 30 tablet 6   No current facility-administered medications on file prior to visit.    Family History  Problem Relation Age of Onset   Ulcerative colitis Mother    Hypertension Mother    Diabetes Father    Heart attack Father    Hypertension Father    Diabetes Sister    Thyroid  disease Sister    Heart attack Maternal Grandmother    Stroke Maternal Grandmother    Breast cancer Neg Hx     Social History   Socioeconomic History   Marital status: Single    Spouse name: Not on file   Number of children: Not on file   Years of education: Not on file   Highest  education level: GED or equivalent  Occupational History   Not on file  Tobacco Use   Smoking status: Never   Smokeless tobacco: Never  Vaping Use   Vaping status: Never Used  Substance and Sexual Activity   Alcohol use: Yes    Alcohol/week: 0.0 standard drinks of alcohol    Comment: occasional beer/wine   Drug use: No   Sexual activity: Yes    Partners: Male    Birth control/protection: Pill, OCP  Other Topics Concern   Not on file  Social History Narrative   Not on file   Social Drivers of Health   Financial Resource Strain: Low Risk  (12/31/2023)   Overall Financial Resource Strain (CARDIA)    Difficulty of Paying Living Expenses: Not very hard  Food Insecurity: No Food Insecurity (12/31/2023)   Hunger Vital Sign    Worried About Running Out of Food in the Last Year: Never true    Ran Out of Food in the Last Year: Never true  Transportation Needs: No Transportation Needs (12/31/2023)   PRAPARE - Administrator, Civil Service (Medical): No    Lack of Transportation (Non-Medical): No  Physical Activity: Inactive (12/31/2023)   Exercise Vital Sign    Days of Exercise per Week: 4 days    Minutes of Exercise per Session: 0 min  Stress: No Stress Concern Present (12/31/2023)   [employer] of Occupational Health - Occupational Stress Questionnaire    Feeling of Stress: Only a little  Recent Concern: Stress - Stress Concern Present (12/15/2023)   [employer] of Occupational Health - Occupational Stress Questionnaire    Feeling of Stress: To some extent  Social Connections: Moderately Isolated (12/31/2023)   Social Connection and Isolation Panel    Frequency of Communication with Friends and Family: More than three times a week    Frequency of Social Gatherings with Friends and Family: Once a week    Attends Religious Services: More than 4 times per year    Active Member of [employer] or Organizations: No    Attends Banker Meetings: Not on  file    Marital Status: Never married  Intimate Partner Violence: Not At Risk (12/15/2023)  Humiliation, Afraid, Rape, and Kick questionnaire    Fear of Current or Ex-Partner: No    Emotionally Abused: No    Physically Abused: No    Sexually Abused: No    Review of Systems:  As stated below and Denies fevers, chills, abdominal pain, urinary symtpoms, constipation, diarrhea, dizziness, rashes, SOB, CP  Vitals:   12/31/23 0812  BP: 117/76  Pulse: 72  Temp: 98.2 F (36.8 C)  TempSrc: Oral  SpO2: 100%  Weight: 193 lb (87.5 kg)  Height: 5' 4 (1.626 m)    Physical Exam: Physical Exam Constitutional:      General: She is not in acute distress.    Appearance: She is not ill-appearing or toxic-appearing.  HENT:     Mouth/Throat:     Mouth: Mucous membranes are moist.     Pharynx: Oropharynx is clear.     Comments: Possible cobblestoning present (see media) Cardiovascular:     Rate and Rhythm: Normal rate and regular rhythm.     Heart sounds: Normal heart sounds. No murmur heard.    No friction rub. No gallop.  Pulmonary:     Effort: Pulmonary effort is normal.     Breath sounds: Normal breath sounds. No stridor. No wheezing, rhonchi or rales.  Abdominal:     General: Bowel sounds are normal.     Palpations: Abdomen is soft.     Tenderness: There is no abdominal tenderness.  Musculoskeletal:     Right lower leg: No edema.     Left lower leg: No edema.  Skin:    General: Skin is warm and dry.  Neurological:     Mental Status: She is alert.      Assessment & Plan:   Patient seen with Dr. Trudy Assessment & Plan Palpitations Complaining of 2 episodes of palpitations while resting, several hours prior to palpitation event, experienced sharp chest pain in the middle of the chest while doing household chores.  While experiencing palpitations, when she moved positions in bed there was resolution of symptoms. Denies current palpitations.  Chest pains only lasted a  couple seconds and did not radiate.  Per patient felt different than any GERD like symptoms she has had before.  Both episodes occurring while in bed, denies anxious thoughts at the time.  Heart disease runs in family.  TSH WNL, B12 WNL.  Drinks large amounts of caffeine: 16 ounces X2 (1 in AM and 1 in PM).  Sleeps generally from 10 PM to 6 AM, however will sometimes go to bed much later.  EKG ordered and unremarkable, no evidence of heart block or PVCs. - Encouraged to cut down on caffeine intake and instructed to drink a minimum of 4 16 oz of water a day.  -If symptoms persist with cutting down on caffeine, will consider Zio patch. Acute cough Complaining of dry cough with constant clearing of throat.  Has history of acid reflux not on any prescription management at this time.  Has tried pantoprazole  but stated it did not work for her.  Has not tried omeprazole .  Endorses postnasal drip symptoms such as feeling drip down back of throat.  On physical exam cobblestoning possibly present on soft palate which would be consistent with postnasal drip, however patient said cough is most bothersome at night when laying down and first thing in the morning which would be most consistent with GERD.  Will treat as if postnasal drip with the below plan, and if no improvement will initiate omeprazole  management. -  Flonase  daily (1 spray in each nostril) and Zyrtec  daily  Other fatigue Endorsing chronic fatigue.  Last CBC WNL, however will order iron panel.  Last TSH WNL.  Last B12 WNL.  States that she sleeps generally from 10 PM to 6 AM, however will sometimes get less sleep.  Drinks large amounts of caffeine throughout the day. -Iron panel ordered Hot flashes Perimenopause--> last menstrual period: stopped in August, lasted April 2 August.  Experiencing hot flashes which have been bothersome.  Also endorsed occasional episodes of sadness, for example when working on homework with her daughter felt sad and cried.   Interested in trying medication management at this time. - venlafaxine 37.5 mg ordered Healthcare maintenance Deferring flu shot at this time. Up to date on CMP, CBC with humera (10/19/2023 WNL). A1C 2021 5.4. Lipid profile 12/15/2022 total cholesterol 217, TG 288, LDL 126. The 10-year ASCVD risk score (Arnett DK, et al., 2019) is: 3.3% - A1C ordered   Orders Placed This Encounter  Procedures   Iron, TIBC and Ferritin Panel   Hemoglobin A1c   POC Hbg A1C   EKG 12-Lead     Jacier Gladu, D.O. [facility], PGY-1 Date 12/31/2023 Time 5:12 PM

## 2023-12-31 NOTE — Patient Instructions (Addendum)
 Today we discussed the following medical conditions and plan:   -reduce caffeine and alcohol  -try to drink 4 16 oz of water a day -venlafaxine, take daily. Will take 4-6 weeks  -A1C today  -iron panel ordered -Flonase  daily (1 spray in each nostril) and Zyrtec  daily   We look forward to seeing you next time. Please call our clinic at [phone] if you have any questions or concerns. The best time to call is Monday-Friday from 9am-4pm, but there is someone available 24/7. If you need medication refills, please notify your pharmacy one week in advance and they will send us  a request.   Thank you for trusting me with your care. Wishing you the best!   Sallyanne Primas, DO  [facility]

## 2023-12-31 NOTE — Assessment & Plan Note (Signed)
 Perimenopause--> last menstrual period: stopped in August, lasted April 2 August.  Experiencing hot flashes which have been bothersome.  Also endorsed occasional episodes of sadness, for example when working on homework with her daughter felt sad and cried.  Interested in trying medication management at this time. - venlafaxine 37.5 mg ordered

## 2024-01-01 LAB — HEMOGLOBIN A1C
Est. average glucose Bld gHb Est-mCnc: 120 mg/dL
Hgb A1c MFr Bld: 5.8 % — ABNORMAL HIGH (ref 4.8–5.6)

## 2024-01-01 LAB — IRON,TIBC AND FERRITIN PANEL
Ferritin: 96 ng/mL (ref 15–150)
Iron Saturation: 19 % (ref 15–55)
Iron: 54 ug/dL (ref 27–159)
Total Iron Binding Capacity: 286 ug/dL (ref 250–450)
UIBC: 232 ug/dL (ref 131–425)

## 2024-01-03 ENCOUNTER — Ambulatory Visit: Payer: Self-pay

## 2024-01-03 NOTE — Telephone Encounter (Signed)
 Discussed with patient results of EKG.  She was understandably concerned with reading of abnormal EKG.  Reassured patient that previous EKG findings were similar back to 2021 and no new concerning findings were present on this EKG (discussed and verified with the attending).  Encouraged patient to let us  know if she had increased or worsening symptoms of chest pain or palpitations.  Encouraged her to try the lifestyle modifications we discussed, if symptoms do not improve, we would order Zio patch.    Discussed further the iron studies which although were technically normal per the lab, that she would benefit from starting iron supplementation based on the percent saturation if she would like.  Encourage lifestyle modifications and dietary change regarding A1c.  Patient no further questions at this time. Encouraged patient to make a closer follow-up appointment or call the clinic if she has further concerns.  Sallyanne Primas, DO [facility] Res[facility] PGY-1

## 2024-01-12 NOTE — Progress Notes (Signed)
Internal Medicine Clinic Attending  I was physically present during the key portions of the resident provided service and participated in the medical decision making of patient's management care. I reviewed pertinent patient test results.  The assessment, diagnosis, and plan were formulated together and I agree with the documentation in the resident's note.  Williams, Julie Anne, MD  

## 2024-01-14 ENCOUNTER — Encounter: Payer: Self-pay | Admitting: Obstetrics and Gynecology

## 2024-01-18 ENCOUNTER — Ambulatory Visit

## 2024-02-01 ENCOUNTER — Encounter: Payer: Self-pay | Admitting: Obstetrics and Gynecology

## 2024-02-10 ENCOUNTER — Other Ambulatory Visit

## 2024-02-11 ENCOUNTER — Inpatient Hospital Stay: Admission: RE | Admit: 2024-02-11 | Discharge: 2024-02-11 | Attending: Obstetrics | Admitting: Obstetrics

## 2024-02-11 DIAGNOSIS — Z1231 Encounter for screening mammogram for malignant neoplasm of breast: Secondary | ICD-10-CM | POA: Diagnosis not present

## 2024-03-10 ENCOUNTER — Ambulatory Visit: Admitting: Internal Medicine

## 2024-03-10 ENCOUNTER — Encounter: Payer: Self-pay | Admitting: Internal Medicine

## 2024-03-10 ENCOUNTER — Other Ambulatory Visit

## 2024-03-10 VITALS — BP 122/70 | HR 78 | Ht 64.0 in | Wt 192.0 lb

## 2024-03-10 DIAGNOSIS — E0590 Thyrotoxicosis, unspecified without thyrotoxic crisis or storm: Secondary | ICD-10-CM | POA: Diagnosis not present

## 2024-03-10 DIAGNOSIS — E041 Nontoxic single thyroid nodule: Secondary | ICD-10-CM | POA: Diagnosis not present

## 2024-03-10 DIAGNOSIS — R7303 Prediabetes: Secondary | ICD-10-CM | POA: Diagnosis not present

## 2024-03-10 NOTE — Progress Notes (Addendum)
 Patient ID: Sue [name], female   DOB: 1975-12-04, 49 y.o.   MRN: [id number]  HPI  Sue [name] is a 49 y.o.-year-old female, returning for follow-up for thyrotoxicosis.  She previously saw Dr. Kassie, but last visit with me last visit  2.5 mo ago. Her mother, Sue [name], is also my pt.  Interim history: Continues to have hot flashes and fatigue.  She previously had occasional palpitations, but not since last visit.  Reviewed history: Patient has a history of mild subclinical thyrotoxicosis intermittently since 2009.   She was started on methimazole  in 2020, during a pregnancy.  She has no complaints at this visit other than red eyes and occasional blurry vision when driving.  She sees Dr. Octavia.  At our visit from 02/2023, she was  on methimazole  5 mg daily >> we decreased the dose to 5 mg every other day.  Unfortunately, she did not return for labs afterwards...  She ran out of Methimazole  08/2023 and did not restart.  I reviewed pt's thyroid  tests: Lab Results  Component Value Date   TSH 0.48 12/21/2023   TSH 0.84 02/26/2023   TSH 0.992 12/15/2022   TSH 0.79 02/27/2022   TSH 0.48 04/22/2021   TSH 0.60 07/26/2020   TSH 0.71 10/27/2019   TSH 1.29 06/27/2019   TSH 0.655 09/12/2018   TSH 0.25 (L) 04/07/2018   FREET4 1.0 12/21/2023   FREET4 1.0 02/26/2023   FREET4 1.1 02/27/2022   FREET4 0.95 04/22/2021   FREET4 0.78 07/26/2020   FREET4 1.1 10/27/2019   FREET4 1.02 06/27/2019   FREET4 1.26 09/12/2018   FREET4 1.06 04/07/2018   FREET4 0.78 05/14/2017   T3FREE 3.3 12/21/2023   T3FREE 3.1 02/26/2023   T3FREE 3.2 02/27/2022   Antithyroid antibodies: Lab Results  Component Value Date   TSI <89 12/21/2023   TSI <89 02/26/2023   TSI <89 02/27/2022   Pt denies: - feeling nodules in neck - hoarseness - dysphagia - choking  She continues to have: - Hot flashes - fatigue  She denies: - tremors - anxiety - hyperdefecation - weight loss - palpitations  Pt has a  FH of thyroid  ds. In her sister and mother. No FH of thyroid  cancer. No h/o radiation tx to head or neck. No steroid use. No herbal supplements. No Biotin use.  Pt. 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  Prediabetes:  Reviewed HbA1c levels: Lab Results  Component Value Date   HGBA1C 5.8 (H) 12/31/2023   HGBA1C 5.4 04/27/2019   HGBA1C 5.2 09/15/2016   HGBA1C 5.4 01/02/2016  11/2023: Reportedly HbA1c 5.9%  ROS: + see HPI  Past Medical History:  Diagnosis Date   Asthma    mild by PFT's 03/16/1998   Cervical lymphadenopathy    biopsy negative( [facility]. [facility])   Chronic hypertension    Complete spontaneous abortion    x2   Cough 01/07/2018   GERD (gastroesophageal reflux disease)    Headache    History of abnormal Pap smear    s/p cryo( Dr. Gordan)   History of ELISA positive for HSV 10/06/2016   History of kidney stones    passed stones   HSV (herpes simplex virus) anogenital infection    Hypertension    Hypothyroidism    Normal vaginal delivery    x3   Rheumatoid arthritis (HCC)    Throat pain in adult    Thyroid  disease  affecting pregnancy    Tonsillar abscess    ULCERATIVE PROCTITIS 12/26/2007   Flex sig 12/12/07 Dr Buccini was c/w ulcerative proctitis.Started on mesalamine  suppositories and all sxs and pathology resolved on repeat flex sig 06/2008.    Vaginal Pap smear, abnormal    Vitamin B12 deficiency    Past Surgical History:  Procedure Laterality Date   biopsy of lymph node in R neck  2007   CYST EXCISION     2024 Feb.   LAPAROSCOPIC APPENDECTOMY N/A 05/08/2019   Procedure: APPENDECTOMY LAPAROSCOPIC WITH DIAGNOSTIC LAPAROSCOPY;  Surgeon: Teresa Lonni HERO, MD;  Location: [facility];  Service: General;  Laterality: N/A;   LEEP     TONSILLECTOMY Bilateral 04/26/2020   Procedure: TONSILLECTOMY;  Surgeon: Llewellyn Gerard LABOR, DO;   Location: [facility];  Service: ENT;  Laterality: Bilateral;   UMBILICAL HERNIA REPAIR N/A 11/06/2022   Procedure: HERNIA REPAIR UMBILICAL ADULT;  Surgeon: Polly Cordella LABOR, MD;  Location: [facility];  Service: General;  Laterality: N/A;   WISDOM TOOTH EXTRACTION     Social History   Socioeconomic History   Marital status: Single    Spouse name: Not on file   Number of children: Not on file   Years of education: Not on file   Highest education level: GED or equivalent  Occupational History   Not on file  Tobacco Use   Smoking status: Never   Smokeless tobacco: Never  Vaping Use   Vaping status: Never Used  Substance and Sexual Activity   Alcohol use: Yes    Alcohol/week: 0.0 standard drinks of alcohol    Comment: occasional beer/wine   Drug use: No   Sexual activity: Yes    Partners: Male    Birth control/protection: Pill, OCP  Other Topics Concern   Not on file  Social History Narrative   Not on file   Social Drivers of Health   Tobacco Use: Low Risk (12/21/2023)   Patient History    Smoking Tobacco Use: Never    Smokeless Tobacco Use: Never    Passive Exposure: Not on file  Financial Resource Strain: Low Risk (12/31/2023)   Overall Financial Resource Strain (CARDIA)    Difficulty of Paying Living Expenses: Not very hard  Food Insecurity: No Food Insecurity (12/31/2023)   Epic    Worried About Radiation Protection Practitioner of Food in the Last Year: Never true    Ran Out of Food in the Last Year: Never true  Transportation Needs: No Transportation Needs (12/31/2023)   Epic    Lack of Transportation (Medical): No    Lack of Transportation (Non-Medical): No  Physical Activity: Inactive (12/31/2023)   Exercise Vital Sign    Days of Exercise per Week: 4 days    Minutes of Exercise per Session: 0 min  Stress: No Stress Concern Present (12/31/2023)   [employer] of Occupational [employer] - Occupational Stress Questionnaire    Feeling of Stress: Only a little  Recent Concern: Stress -  Stress Concern Present (12/15/2023)   [employer] of Occupational Health - Occupational Stress Questionnaire    Feeling of Stress: To some extent  Social Connections: Moderately Isolated (12/31/2023)   Social Connection and Isolation Panel    Frequency of Communication with Friends and Family: More than three times a week    Frequency of Social Gatherings with Friends and Family: Once a week    Attends Religious Services: More than 4 times per year    Active Member of Clubs or Organizations: No  Attends Banker Meetings: Not on file    Marital Status: Never married  Intimate Partner Violence: Not At Risk (12/15/2023)   Epic    Fear of Current or Ex-Partner: No    Emotionally Abused: No    Physically Abused: No    Sexually Abused: No  Depression (PHQ2-9): Medium Risk (12/15/2023)   Depression (PHQ2-9)    PHQ-2 Score: 5  Alcohol Screen: Low Risk (12/31/2023)   Alcohol Screen    Last Alcohol Screening Score (AUDIT): 2  Housing: Low Risk (12/31/2023)   Epic    Unable to Pay for Housing in the Last Year: No    Number of Times Moved in the Last Year: 0    Homeless in the Last Year: No  Utilities: Not At Risk (12/15/2023)   Epic    Threatened with loss of utilities: No  Health Literacy: Adequate Health Literacy (12/15/2023)   B1300 Health Literacy    Frequency of need for help with medical instructions: Never   Current Outpatient Medications on File Prior to Visit  Medication Sig Dispense Refill   albuterol  (VENTOLIN  HFA) 108 (90 Base) MCG/ACT inhaler Inhale 1-2 puffs into the lungs every 6 (six) hours as needed for wheezing or shortness of breath. 8 g 3   amLODipine  (NORVASC ) 10 MG tablet TAKE 1 TABLET BY MOUTH EVERY DAY 90 tablet 1   Brimonidine Tartrate (LUMIFY) 0.025 % SOLN Place 1 drop into both eyes as needed (irritation). (Patient not taking: Reported on 09/06/2023)     calcium  carbonate (TUMS - DOSED IN MG ELEMENTAL CALCIUM ) 500 MG chewable tablet Chew 1-2  tablets by mouth 3 (three) times daily as needed for indigestion or heartburn.     cyanocobalamin  (VITAMIN B12) 1000 MCG/ML injection Inject 1 mL (1,000 mcg total) into the skin every 30 (thirty) days. 10 mL 0   Drospirenone  (SLYND ) 4 MG TABS Take 1 tablet (4 mg total) by mouth daily before breakfast. 84 tablet 3   HUMIRA, 2 PEN, 40 MG/0.4ML pen Inject 40 mg into the skin once a week.     Multiple Vitamin (MULTIVITAMIN WITH MINERALS) TABS tablet Take 1 tablet by mouth daily.     valACYclovir  (VALTREX ) 500 MG tablet Take 1 tablet bid x 3 days prn outbreaks. 30 tablet 6   venlafaxine  XR (EFFEXOR  XR) 37.5 MG 24 hr capsule Take 1 capsule (37.5 mg total) by mouth daily. 30 capsule 2   No current facility-administered medications on file prior to visit.   Allergies  Allergen Reactions   Ciprofloxacin Anaphylaxis and Swelling   Clindamycin /Lincomycin Anaphylaxis and Swelling   Bactrim [Sulfamethoxazole-Trimethoprim] Swelling    Swelling is of the lips   Amoxicillin Rash    Has patient had a PCN reaction causing immediate rash, facial/tongue/throat swelling, SOB or lightheadedness with hypotension: no Has patient had a PCN reaction causing severe rash involving mucus membranes or skin necrosis: pt had rash -- non severe  Has patient had a PCN reaction that required hospitalization: no Has patient had a PCN reaction occurring within the last 10 years: no If all of the above answers are NO, then may proceed with Cephalosporin use.    Family History  Problem Relation Age of Onset   Ulcerative colitis Mother    Hypertension Mother    Diabetes Father    Heart attack Father    Hypertension Father    Diabetes Sister    Thyroid  disease Sister    Heart attack Maternal Grandmother    Stroke  Maternal Grandmother    Breast cancer Neg Hx    PE: BP 122/70   Pulse 78   Ht 5' 4 (1.626 m)   Wt 192 lb (87.1 kg)   SpO2 96%   BMI 32.96 kg/m  Wt Readings from Last 10 Encounters:  03/10/24 192 lb  (87.1 kg)  12/31/23 193 lb (87.5 kg)  12/21/23 193 lb 9.6 oz (87.8 kg)  12/13/23 191 lb 6.4 oz (86.8 kg)  10/22/23 198 lb (89.8 kg)  09/06/23 190 lb 9.6 oz (86.5 kg)  08/25/23 193 lb (87.5 kg)  03/05/23 195 lb (88.5 kg)  02/26/23 196 lb 9.6 oz (89.2 kg)  12/20/22 198 lb (89.8 kg)   Constitutional: overweight, in NAD Eyes:  EOMI, no exophthalmos, no lid lag, no stare ENT: no neck masses, no thyromegaly, no cervical lymphadenopathy Cardiovascular: RRR, No MRG Respiratory: CTA B Musculoskeletal: no deformities Skin:no rashes Neurological: no tremor with outstretched hands  ASSESSMENT: 1. Thyrotoxicosis  2.  Thyroid  nodule  3.  Prediabetes  PLAN:  1. Patient with history of mild thyrotoxicosis dating back to 2009 per review of the chart, with intermittently normal TFTs afterwards.  She mentioned that she was advised about the diagnosis of hyperthyroidism approximately 6 years ago during pregnancy but she was asymptomatic.  She was started on methimazole  at that time and we continued 5 mg of methimazole  daily until last visit, when TFTs and TSIs were normal, and I advised her to decrease the methimazole  dose to 5 mg every other day.  She tolerates methimazole  well, without side effects.  Her liver tests and WBC were normal in 10/2023 per review of outside records. - We did discuss about etiologies for her thyrotoxicosis to include Graves' disease (however, TSI's were normal), thyroiditis, toxic multinodular goiter/toxic adenoma.  She did not appear to have exogenous causes for her low TSH.  Working diagnosis is mild Graves' disease - She denies thyrotoxic symptoms including unintentional weight loss, tremors, anxiety, hyperdefecation.  She does have hot flashes and night sweats, not uncommon with perimenopause. She also has some palpitations which are not new for her. - she has no active signs of Graves' ophthalmopathy: No blurry vision, double vision, eye pain, chemosis - At last visit,  her TFTs were normal so we did not have to start back on methimazole  - RTC in 6 months  2. Thyroid  nodule -Patient with a history of a subcentimeter left thyroid  nodule incidentally seen on the neck CT from 2014 -She had a repeat neck CT scan in 01/2020 and this showed stability of the nodule, which did not meet criteria for further ultrasound investigation - No neck compression symptoms or nodules felt on palpation of her neck today - Will continue to follow her expectantly  3.  Prediabetes - pt. had a fairly recent HbA1c of 5.9%, previously 5.8%, while previous values have been normal.  We did discuss that this is in the low range for prediabetes.  - At today's visit, we again discussed about starting to improve diet by stopping sweet drinks, desserts, and sweets between meals, reducing fried foods and I also recommended consistent exercise.  We discussed that insulin resistance is not uncommon during perimenopause but is reversible with the above measures. - Will repeat another HbA1c today - I will see her back in 6 months  Component     Latest Ref Rng 03/10/2024  Hemoglobin A1C     <5.7 % 5.6   Mean Plasma Glucose     mg/dL 885  TSH     mIU/L 0.41   T4,Free(Direct)     0.8 - 1.8 ng/dL 1.2   Triiodothyronine,Free,Serum     2.3 - 4.2 pg/mL 3.5   eAG (mmol/L)     mmol/L 6.3   Normal.  Lela Fendt, MD PhD [facility]

## 2024-03-10 NOTE — Patient Instructions (Addendum)
 Please continue off Methimazole .  Please stop at the lab.  Please return for a follow-up appointment in 6 months.  Please consider the following ways to cut down carbs and fat and increase fiber and micronutrients in your diet: - substitute whole grain for white bread or pasta - substitute brown rice for white rice - substitute 90-calorie flat bread pieces for slices of bread when possible - substitute sweet potatoes or yams for white potatoes - substitute humus for margarine - substitute tofu for cheese when possible - substitute almond or rice milk for regular milk (would not drink soy milk daily due to concern for soy estrogen influence on breast cancer risk) - substitute dark chocolate for other sweets when possible - substitute water - can add lemon or orange slices for taste - for diet sodas (artificial sweeteners will trick your body that you can eat sweets without getting calories and will lead you to overeating and weight gain in the long run) - do not skip breakfast or other meals (this will slow down the metabolism and will result in more weight gain over time)  - can try smoothies made from fruit and almond/rice milk in am instead of regular breakfast - can also try old-fashioned (not instant) oatmeal made with almond/rice milk in am - order the dressing on the side when eating salad at a restaurant (pour less than half of the dressing on the salad) - eat as little meat as possible - can try juicing, but should not forget that juicing will get rid of the fiber, so would alternate with eating raw veg./fruits or drinking smoothies - use as little oil as possible, even when using olive oil - can dress a salad with a mix of balsamic vinegar and lemon juice, for e.g. - use agave nectar, stevia sugar, or regular sugar rather than artificial sweateners - steam or broil/roast veggies  - snack on veggies/fruit/nuts (unsalted, preferably) when possible, rather than processed foods -  reduce or eliminate aspartame in diet (it is in diet sodas, chewing gum, etc) Read the labels!  Try to read Dr. Rosalynn Points book: Program for Reversing Diabetes for other ideas for healthy eating.

## 2024-03-11 LAB — T3, FREE: T3, Free: 3.5 pg/mL (ref 2.3–4.2)

## 2024-03-11 LAB — T4, FREE: Free T4: 1.2 ng/dL (ref 0.8–1.8)

## 2024-03-11 LAB — HEMOGLOBIN A1C
Hgb A1c MFr Bld: 5.6 %
Mean Plasma Glucose: 114 mg/dL
eAG (mmol/L): 6.3 mmol/L

## 2024-03-11 LAB — TSH: TSH: 0.41 m[IU]/L

## 2024-03-13 ENCOUNTER — Ambulatory Visit: Admitting: Internal Medicine

## 2024-03-13 ENCOUNTER — Ambulatory Visit: Payer: Self-pay | Admitting: Internal Medicine

## 2024-03-20 ENCOUNTER — Other Ambulatory Visit: Payer: Self-pay | Admitting: Student

## 2024-03-20 ENCOUNTER — Telehealth: Payer: Self-pay | Admitting: Student

## 2024-03-20 DIAGNOSIS — I10 Essential (primary) hypertension: Secondary | ICD-10-CM

## 2024-03-20 MED ORDER — AMLODIPINE BESYLATE 10 MG PO TABS: 10.0000 mg | ORAL_TABLET | Freq: Every day | ORAL | 3 refills | Status: AC

## 2024-03-20 NOTE — Telephone Encounter (Unsigned)
 Copied from CRM [id number]. Topic: Clinical - Medication Refill >> Mar 20, 2024  2:22 PM Susanna ORN wrote: Medication: amLODipine  (NORVASC ) 10 MG tablet  Has the patient contacted their pharmacy? No (Agent: If no, request that the patient contact the pharmacy for the refill. If patient does not wish to contact the pharmacy document the reason why and proceed with request.) (Agent: If yes, when and what did the pharmacy advise?)  This is the patient's preferred pharmacy:  [facility] [address], [place] - [address] [place] [address] Phone: [phone] Fax: [phone]  Is this the correct pharmacy for this prescription? Yes If no, delete pharmacy and type the correct one.   Has the prescription been filled recently? No  Is the patient out of the medication? Yes  Has the patient been seen for an appointment in the last year OR does the patient have an upcoming appointment? Yes  Can we respond through MyChart? Yes  Agent: Please be advised that Rx refills may take up to 3 business days. We ask that you follow-up with your pharmacy.

## 2024-03-23 ENCOUNTER — Other Ambulatory Visit

## 2024-03-31 ENCOUNTER — Ambulatory Visit: Admitting: Internal Medicine

## 2024-09-22 ENCOUNTER — Ambulatory Visit: Admitting: Internal Medicine

## 2024-12-19 ENCOUNTER — Ambulatory Visit: Admitting: Internal Medicine
